# Patient Record
Sex: Male | Born: 1955
Health system: Southern US, Community
[De-identification: ages and names within clinical notes are randomized; demographics above are authoritative.]

## PROBLEM LIST (undated history)

## (undated) DIAGNOSIS — C8190 Hodgkin lymphoma, unspecified, unspecified site: Secondary | ICD-10-CM

## (undated) DIAGNOSIS — K219 Gastro-esophageal reflux disease without esophagitis: Secondary | ICD-10-CM

## (undated) DIAGNOSIS — J449 Chronic obstructive pulmonary disease, unspecified: Secondary | ICD-10-CM

## (undated) DIAGNOSIS — M4802 Spinal stenosis, cervical region: Secondary | ICD-10-CM

## (undated) DIAGNOSIS — F32A Depression, unspecified: Secondary | ICD-10-CM

## (undated) DIAGNOSIS — M51369 Other intervertebral disc degeneration, lumbar region without mention of lumbar back pain or lower extremity pain: Secondary | ICD-10-CM

## (undated) DIAGNOSIS — J869 Pyothorax without fistula: Secondary | ICD-10-CM

## (undated) DIAGNOSIS — H8109 Meniere's disease, unspecified ear: Secondary | ICD-10-CM

## (undated) DIAGNOSIS — M549 Dorsalgia, unspecified: Secondary | ICD-10-CM

## (undated) DIAGNOSIS — I2510 Atherosclerotic heart disease of native coronary artery without angina pectoris: Secondary | ICD-10-CM

## (undated) DIAGNOSIS — J189 Pneumonia, unspecified organism: Secondary | ICD-10-CM

## (undated) DIAGNOSIS — M1990 Unspecified osteoarthritis, unspecified site: Secondary | ICD-10-CM

## (undated) DIAGNOSIS — H9191 Unspecified hearing loss, right ear: Secondary | ICD-10-CM

## (undated) DIAGNOSIS — R0600 Dyspnea, unspecified: Secondary | ICD-10-CM

## (undated) DIAGNOSIS — I739 Peripheral vascular disease, unspecified: Secondary | ICD-10-CM

## (undated) DIAGNOSIS — J439 Emphysema, unspecified: Secondary | ICD-10-CM

## (undated) DIAGNOSIS — I959 Hypotension, unspecified: Secondary | ICD-10-CM

## (undated) DIAGNOSIS — U071 COVID-19: Secondary | ICD-10-CM

## (undated) DIAGNOSIS — F329 Major depressive disorder, single episode, unspecified: Secondary | ICD-10-CM

## (undated) DIAGNOSIS — M5136 Other intervertebral disc degeneration, lumbar region: Secondary | ICD-10-CM

## (undated) DIAGNOSIS — H9319 Tinnitus, unspecified ear: Secondary | ICD-10-CM

## (undated) HISTORY — DX: Meniere's disease, unspecified ear: H81.09

## (undated) HISTORY — DX: Unspecified osteoarthritis, unspecified site: M19.90

## (undated) HISTORY — DX: Pneumonia, unspecified organism: J18.9

## (undated) HISTORY — DX: Major depressive disorder, single episode, unspecified: F32.9

## (undated) HISTORY — PX: ELBOW SURGERY: SHX618

## (undated) HISTORY — DX: Tinnitus, unspecified ear: H93.19

## (undated) HISTORY — PX: EYE SURGERY: SHX253

## (undated) HISTORY — DX: Depression, unspecified: F32.A

## (undated) HISTORY — DX: Emphysema, unspecified: J43.9

## (undated) HISTORY — DX: Other intervertebral disc degeneration, lumbar region: M51.36

## (undated) HISTORY — DX: Gastro-esophageal reflux disease without esophagitis: K21.9

## (undated) HISTORY — PX: TONSILLECTOMY: SUR1361

## (undated) HISTORY — PX: OTHER SURGICAL HISTORY: SHX169

## (undated) HISTORY — DX: Other intervertebral disc degeneration, lumbar region without mention of lumbar back pain or lower extremity pain: M51.369

## (undated) HISTORY — DX: Pyothorax without fistula: J86.9

## (undated) HISTORY — DX: Chronic obstructive pulmonary disease, unspecified: J44.9

## (undated) HISTORY — PX: BACK SURGERY: SHX140

## (undated) HISTORY — DX: Hypotension, unspecified: I95.9

## (undated) HISTORY — DX: Hodgkin lymphoma, unspecified, unspecified site: C81.90

---

## 1997-11-03 DIAGNOSIS — C8190 Hodgkin lymphoma, unspecified, unspecified site: Secondary | ICD-10-CM

## 1997-11-03 HISTORY — DX: Hodgkin lymphoma, unspecified, unspecified site: C81.90

## 1997-11-03 HISTORY — PX: LAMINECTOMY: SHX219

## 2014-11-03 HISTORY — PX: CERVICAL FUSION: SHX112

## 2014-12-05 ENCOUNTER — Ambulatory Visit
Admit: 2014-12-05 | Discharge: 2014-12-05 | Disposition: A | Discharge status: Home / Self Care | Payer: Self-pay | Source: Ambulatory Visit | Attending: Neurosurgery | Admitting: Neurosurgery

## 2014-12-26 ENCOUNTER — Encounter: Payer: Self-pay | Admitting: Neurosurgery

## 2014-12-26 ENCOUNTER — Ambulatory Visit
Admit: 2014-12-26 | Discharge: 2014-12-26 | Disposition: A | Discharge status: Home / Self Care | Payer: Self-pay | Source: Ambulatory Visit | Attending: Neurosurgery | Admitting: Neurosurgery

## 2014-12-26 HISTORY — DX: Hodgkin lymphoma, unspecified, unspecified site: C81.90

## 2014-12-26 HISTORY — DX: Gastro-esophageal reflux disease without esophagitis: K21.9

## 2014-12-26 HISTORY — DX: Spinal stenosis, cervical region: M48.02

## 2014-12-26 HISTORY — DX: Chronic obstructive pulmonary disease, unspecified: J44.9

## 2014-12-26 LAB — PROTIME-INR
INR: 0.9 — ABNORMAL LOW (ref 1.0–1.2)
Protime: 10.5 s (ref 9.2–12.3)

## 2014-12-26 LAB — CBC
Hematocrit: 41 % (ref 40–51)
Hemoglobin: 13.9 g/dL (ref 13.7–17.5)
MCH: 34 pg — ABNORMAL HIGH (ref 26–32)
MCHC: 34 g/dL (ref 32–37)
MCV: 101 fL — ABNORMAL HIGH (ref 79–92)
Platelets: 141 10*3/uL — ABNORMAL LOW (ref 150–330)
RBC: 4 MIL/uL — ABNORMAL LOW (ref 4.6–6.1)
RDW: 13.2 % (ref 11.6–14.4)
WBC: 4 10*3/uL — ABNORMAL LOW (ref 4.2–9.1)

## 2014-12-26 LAB — BASIC METABOLIC PANEL
Anion Gap: 12 (ref 7–16)
CO2: 28 mmol/L (ref 20–28)
Calcium: 9 mg/dL (ref 8.6–10.2)
Chloride: 96 mmol/L (ref 96–108)
Creatinine: 1.04 mg/dL (ref 0.67–1.17)
GFR,Black: 90 *
GFR,Caucasian: 77 *
Glucose: 80 mg/dL (ref 60–99)
Lab: 10 mg/dL (ref 6–20)
Potassium: 4.4 mmol/L (ref 3.3–5.1)
Sodium: 136 mmol/L (ref 133–145)

## 2014-12-26 LAB — TYPE AND SCREEN
ABO RH Blood Type: O POS
Antibody Screen: NEGATIVE

## 2014-12-26 LAB — APTT: aPTT: 31.4 s (ref 25.8–37.9)

## 2014-12-26 NOTE — Anesthesia Preprocedure Evaluation (Addendum)
Anesthesia Pre-operative History and Physical for Joel Bryan    ______________________________________________________________________________________    By Cristela Felt, NP at 3:05 PM on 12/26/2014    <[email or web address]>  Anesthesia Evaluation Information Source: patient, records, family     ANESTHESIA     Denies anesthesia history  Pertinent(-):  history of anesthetic complications, Family Hx of Anesthetic Complications    GENERAL  Pertinent (-):  obesity, communication issues, substance abuse, history of anesthetic complications, Family Hx of Anesthetic Complications    HEENT    + Corrective Eyewear            glasses    + Neck Pain  Pertinent (-):   glaucoma PULMONARY    + Smoker            former, quit recently    + COPD (Daily spiriva. Rare need for rescue inhaler)            mild    + Cough/Congestion (chronic cough)  Pertinent(-): asthma, shortness of breath, recent URI, sleep apnea    CARDIOVASCULAR  Good(4+METs) Exercise Tolerance  Pertinent(-):  hypertension, past MI, CAD, anticoagulants, dysrhythmias, cardiac device, CHF, DVT    GI/HEPATIC/RENAL  Last PO Intake: >8hr before procedure    + GERD            well controlled    + Alcohol use            >2 drinks/day  Pertinent(-):  hiatal hernia, nausea, vomiting, renal issues, urinary issues NEURO/PSYCH  Pertinent(-):  syncope, seizures, cerebrovascular event    ENDO/OTHER  Pertinent(-):  diabetes mellitus, thyroid disease    HEMALOGIC  Pertinent(-):  bruises/bleeds easily, coagulopathy, arthritis       Physical Exam    Airway            Mouth opening: normal            Mallampati: III            TM distance (fb): >3 FB            Neck ROM: limited            Facial hair: beard, mustache  Dental    Upper: dentures Lower: dentures   Cardiovascular           Rhythm: regular           Rate: normal    Neurologic    Normal Exam    General Survey    Normal Exam   Pulmonary     + Wheezes  Inspiratory    Mental Status   Normal Exam        ________________________________________________________________________  Plan  ASA Score  2  Anesthetic Plan general    Induction (RSI); General Anesthesia/Sedation Maintenance Plan (inhaled agents);  Airway Manipulation (direct laryngoscopy); Airway (cuffed ETT); Line ( use current access); Monitoring (standard ASA); Positioning (supine); PONV Plan (dexamethasone, promethazine and ondansetron); Pain (per surgical team and intraop local); PostOp (PACU)    Informed Consent     Risks:          Risks discussed were commensurate with the plan listed above with the following specific points: N/V and sore throat , damage to:(eyes, teeth), allergic Rx, unexpected serious injury, death    Anesthetic Consent:      Anesthetic plan (and risks as noted above) were discussed with patient and spouse    Attending Attestation:  As the primary attending anesthesiologist, I attest that the patient or proxy understands  and accepts the risks and benefits of the anesthesia plan. I also attest that I have personally performed a pre-anesthetic examination and evaluation, and prescribed the anesthetic plan for this particular location within 48 hours prior to the anesthetic as documented.

## 2014-12-26 NOTE — Discharge Instructions (Addendum)
Pre-Operative Instructions Record                    HH [id number] MR    PRIOR TO SURGERY    Five days before surgery, you must STOP all aspirin, ibuprofen (Advil, Naproxen, Aleve, Motrin, etc.) and all vitamins and herbal supplements.     YOU MAY TAKE ACETAMINOPHEN (TYLENOL).    FOLLOW YOUR SURGEON'S INSTRUCTIONS IF DIFFERENT THAN ABOVE.      DAY BEFORE SURGERY   2/26    Call [phone] between 1PM and 4PM and select option #1 to receive your arrival/surgery time.        Do not eat anything (including candy or gum) after midnight the night before your surgery.  ____________________________________________________________________________    DAY OF SURGERY    2/29    Up to 4 hours before your surgery time, clear liquids are allowed (unless your doctor tells you differently).  Examples: black coffee or tea (no dairy or non-dairy creamer), soda, water, clear apple or cranberry juice.  No orange or tomato juice.     MEDICATIONS   PLEASE REFER TO THE MEDICATION LIST ON THE ATTACHED PAGE AND ONLY TAKE THE MEDICATIONS MARKED ON THAT LIST.   DO NOT TAKE ANY MEDICATIONS THAT WERE ALREADY STOPPED (ABOVE).   Bring and use inhalers as needed.   Pain and anxiety medications may be taken with a sip of water at any time.      DO NOT WEAR ANY RINGS, JEWELRY,  BODY LOTION OR SCENTS.      You may brush your teeth and use deodorant.  If wearing eyeglasses, please bring a case.  DO NOT WEAR CONTACT LENSES.     __ BRING CPAP IF USING           _X_ Skin prep and instructions given         PLEASE SHOWER THE NIGHT BEFORE SURGERY AND THE MORNING OF SURGERY         _X_ Bring brace into the hospital on the day of surgery  ________________________________________________________________________________    AT [place] in the [place] garage.  Report to [facility] on [address].  Leave your belongings in the car and your  visitors can bring them to your room after surgery.    Any questions? Call [phone], select option 1, and ask to speak with a nurse or call your surgeon.    The PATIENT AND FAMILY has participated in the development of this discharge plan and the above material has been reviewed.  Questions have been answered and he/she understands the contents of this plan and has received a copy of it.  Shari Heritage, RN 12/26/2014 2:47 PM        ON THE DAY OF YOUR SURGERY, FROM THE LIST OF MEDICATIONS BELOW, TAKE ONLY THOSE MEDICATIONS CHECKED.

## 2014-12-27 NOTE — Progress Notes (Signed)
Chart review completed due to upcoming surgery. No Sw needs identified at this time.    Helene Shoe, LMSW  12/27/2014  9:22 AM  #socialwork

## 2015-01-01 ENCOUNTER — Encounter
Disposition: A | Discharge status: Home / Self Care | Payer: Self-pay | Source: Ambulatory Visit | Attending: Neurosurgery

## 2015-01-01 SURGERY — DISCECTOMY, SPINE, CERVICAL, ANTERIOR APPROACH, WITH FUSION
Anesthesia: General | Site: Neck

## 2015-01-09 NOTE — Plan of Care (Signed)
Problem: Knowledge deficit related to pre or post-op regimens  Goal: Patient verbalizes understanding of PACU teaching  Outcome: Completed or Resolved Date Met:  01/09/15

## 2015-01-09 NOTE — Telephone (Signed)
Patient surgery moved from original date. Reinforced instructions given at pretesting appointment. Patient verbalized understanding of instructions.

## 2015-01-11 NOTE — Progress Notes (Signed)
Chart review completed due to upcoming surgery. No SW needs identified at this time.      Helene Shoe, LMSW  01/11/2015  11:09 AM  #socialwork

## 2015-01-16 ENCOUNTER — Inpatient Hospital Stay
Admit: 2015-01-16 | Disposition: A | Discharge status: Home / Self Care | Payer: Self-pay | Source: Ambulatory Visit | Attending: Neurosurgery | Admitting: Neurosurgery

## 2015-01-16 ENCOUNTER — Ambulatory Visit: Payer: Self-pay

## 2015-01-16 ENCOUNTER — Encounter: Payer: Self-pay | Admitting: Family Medicine

## 2015-01-16 ENCOUNTER — Encounter
Disposition: A | Discharge status: Home / Self Care | Payer: Self-pay | Source: Ambulatory Visit | Attending: Neurosurgery

## 2015-01-16 LAB — POCT GLUCOSE
Glucose POCT: 90 mg/dL (ref 60–99)
Glucose POCT: 92 mg/dL (ref 60–99)

## 2015-01-16 LAB — TYPE AND SCREEN
ABO RH Blood Type: O POS
Antibody Screen: NEGATIVE

## 2015-01-16 SURGERY — DISCECTOMY, SPINE, CERVICAL, ANTERIOR APPROACH, WITH FUSION
Anesthesia: General | Site: Neck | Wound class: Clean

## 2015-01-16 MED ORDER — DEXAMETHASONE SODIUM PHOSPHATE 4 MG/ML INJ SOLN *WRAPPED*
INTRAMUSCULAR | Status: DC | PRN
Start: 2015-01-16 — End: 2015-01-16
  Administered 2015-01-16: 4 mg via INTRAVENOUS

## 2015-01-16 MED ORDER — DIAZEPAM 5 MG PO TABS *I*
5.0000 mg | ORAL_TABLET | Freq: Once | ORAL | Status: AC | PRN
Start: 2015-01-16 — End: 2015-01-16

## 2015-01-16 MED ORDER — FENTANYL CITRATE 50 MCG/ML IJ SOLN *WRAPPED*
INTRAMUSCULAR | Status: AC
Start: 2015-01-16 — End: 2015-01-16
  Filled 2015-01-16: qty 5

## 2015-01-16 MED ORDER — HYDROMORPHONE HCL PF 1 MG/ML IJ SOLN *WRAPPED*
INTRAMUSCULAR | Status: AC
Start: 2015-01-16 — End: 2015-01-16
  Filled 2015-01-16: qty 1

## 2015-01-16 MED ORDER — HYDROMORPHONE HCL PF 1 MG/ML IJ SOLN *WRAPPED*
0.4000 mg | INTRAMUSCULAR | Status: DC | PRN
Start: 2015-01-16 — End: 2015-01-16
  Administered 2015-01-16 (×4): 0.4 mg via INTRAVENOUS
  Filled 2015-01-16 (×2): qty 1

## 2015-01-16 MED ORDER — LIDOCAINE HCL 2 % (PF) IJ SOLN *I*
INTRAMUSCULAR | Status: AC
Start: 2015-01-16 — End: 2015-01-16
  Filled 2015-01-16: qty 5

## 2015-01-16 MED ORDER — SUCCINYLCHOLINE CHLORIDE 20 MG/ML IV/IJ SOLN *WRAPPED*
Status: AC
Start: 2015-01-16 — End: 2015-01-16
  Filled 2015-01-16: qty 10

## 2015-01-16 MED ORDER — LACTATED RINGERS IV SOLN *I*
20.0000 mL/h | INTRAVENOUS | Status: DC
Start: 2015-01-16 — End: 2015-01-16
  Administered 2015-01-16: 20 mL/h via INTRAVENOUS

## 2015-01-16 MED ORDER — PROPOFOL 10 MG/ML IV EMUL (INTERMITTENT DOSING) WRAPPED *I*
INTRAVENOUS | Status: DC | PRN
Start: 2015-01-16 — End: 2015-01-16
  Administered 2015-01-16: 160 mg via INTRAVENOUS
  Administered 2015-01-16: 40 mg via INTRAVENOUS

## 2015-01-16 MED ORDER — LACTATED RINGERS IV SOLN *I*
75.0000 mL/h | INTRAVENOUS | Status: DC
Start: 2015-01-16 — End: 2015-01-17
  Administered 2015-01-16: 75 mL/h via INTRAVENOUS

## 2015-01-16 MED ORDER — IPRATROPIUM-ALBUTEROL 0.5-2.5 MG/3ML IN SOLN *I*
3.0000 mL | Freq: Once | RESPIRATORY_TRACT | Status: DC
Start: 2015-01-16 — End: 2015-01-16
  Filled 2015-01-16: qty 3

## 2015-01-16 MED ORDER — PROMETHAZINE HCL 25 MG/ML IJ SOLN *I*
INTRAMUSCULAR | Status: AC
Start: 2015-01-16 — End: 2015-01-16
  Filled 2015-01-16: qty 1

## 2015-01-16 MED ORDER — PROMETHAZINE HCL 25 MG/ML IJ SOLN *I*
INTRAMUSCULAR | Status: DC | PRN
Start: 2015-01-16 — End: 2015-01-16
  Administered 2015-01-16: 12.5 mg via INTRAVENOUS

## 2015-01-16 MED ORDER — BUDESONIDE-FORMOTEROL FUMARATE 160-4.5 MCG/ACT IN AERO *I*
2.0000 | INHALATION_SPRAY | Freq: Two times a day (BID) | RESPIRATORY_TRACT | Status: DC
Start: 2015-01-16 — End: 2015-01-17
  Administered 2015-01-16: 2 via RESPIRATORY_TRACT
  Filled 2015-01-16: qty 6

## 2015-01-16 MED ORDER — OXYCODONE HCL ER 20 MG PO T12A *I*
30.0000 mg | EXTENDED_RELEASE_TABLET | Freq: Two times a day (BID) | ORAL | Status: DC
Start: 2015-01-16 — End: 2015-01-17
  Administered 2015-01-16: 30 mg via ORAL
  Filled 2015-01-16 (×2): qty 1

## 2015-01-16 MED ORDER — LIDOCAINE HCL 2 % IJ SOLN *I*
INTRAMUSCULAR | Status: DC | PRN
Start: 2015-01-16 — End: 2015-01-16
  Administered 2015-01-16: 80 mg via INTRAVENOUS

## 2015-01-16 MED ORDER — SENNOSIDES 8.6 MG PO TABS *I*
2.0000 | ORAL_TABLET | Freq: Every evening | ORAL | Status: DC
Start: 2015-01-16 — End: 2015-01-17
  Administered 2015-01-16: 2 via ORAL
  Filled 2015-01-16: qty 2

## 2015-01-16 MED ORDER — LACTATED RINGERS IV SOLN *I*
125.0000 mL/h | INTRAVENOUS | Status: DC
Start: 2015-01-16 — End: 2015-01-16
  Administered 2015-01-16 (×2): 125 mL/h via INTRAVENOUS

## 2015-01-16 MED ORDER — ROCURONIUM BROMIDE 10 MG/ML IV SOLN *WRAPPED*
Status: AC
Start: 2015-01-16 — End: 2015-01-16
  Filled 2015-01-16: qty 5

## 2015-01-16 MED ORDER — DEXAMETHASONE SODIUM PHOSPHATE 4 MG/ML INJ SOLN *WRAPPED*
INTRAMUSCULAR | Status: AC
Start: 2015-01-16 — End: 2015-01-16
  Filled 2015-01-16: qty 2

## 2015-01-16 MED ORDER — LIDOCAINE HCL 1 % IJ SOLN *I*
0.1000 mL | INTRAMUSCULAR | Status: DC | PRN
Start: 2015-01-16 — End: 2015-01-16
  Administered 2015-01-16: 0.1 mL via SUBCUTANEOUS
  Filled 2015-01-16: qty 2

## 2015-01-16 MED ORDER — MIDAZOLAM HCL 5 MG/5ML IJ SOLN *I*
INTRAMUSCULAR | Status: AC
Start: 2015-01-16 — End: 2015-01-16
  Filled 2015-01-16: qty 5

## 2015-01-16 MED ORDER — NEOSTIGMINE METHYLSULFATE 10 MG/10ML IV SOLN *I*
INTRAVENOUS | Status: DC | PRN
Start: 2015-01-16 — End: 2015-01-16
  Administered 2015-01-16: 3 mg via INTRAVENOUS

## 2015-01-16 MED ORDER — THROMBIN (RECOMBINANT) 5000 UNIT EX SOLR *I* WRAPPED
CUTANEOUS | Status: DC | PRN
Start: 2015-01-16 — End: 2015-01-16
  Administered 2015-01-16: 10000 [IU] via TOPICAL

## 2015-01-16 MED ORDER — SODIUM CHLORIDE 0.9 % IV SOLN WRAPPED *I*
INTRAMUSCULAR | Status: DC | PRN
Start: 2015-01-16 — End: 2015-01-16
  Administered 2015-01-16: 500 mL

## 2015-01-16 MED ORDER — ALBUTEROL SULFATE (2.5 MG/3ML) 0.083% IN NEBU *I*
2.5000 mg | INHALATION_SOLUTION | Freq: Once | RESPIRATORY_TRACT | Status: DC | PRN
Start: 2015-01-16 — End: 2015-01-16
  Filled 2015-01-16: qty 3

## 2015-01-16 MED ORDER — CEFAZOLIN 2000 MG IN 100 ML D5W *I*
2000.0000 mg | INTRAVENOUS | Status: AC
Start: 2015-01-16 — End: 2015-01-16
  Administered 2015-01-16: 2 g via INTRAVENOUS
  Filled 2015-01-16: qty 1

## 2015-01-16 MED ORDER — MIDAZOLAM HCL 1 MG/ML IJ SOLN *I* WRAPPED
INTRAMUSCULAR | Status: DC | PRN
Start: 2015-01-16 — End: 2015-01-16
  Administered 2015-01-16: 3 mg via INTRAVENOUS
  Administered 2015-01-16: 2 mg via INTRAVENOUS

## 2015-01-16 MED ORDER — POVIDONE-IODINE 5 % EX SOLN *I*
Freq: Once | CUTANEOUS | Status: AC
Start: 2015-01-16 — End: 2015-01-16

## 2015-01-16 MED ORDER — PHENYLEPHRINE 100 MCG/ML IN NS 10 ML *WRAPPED*
INTRAMUSCULAR | Status: DC | PRN
Start: 2015-01-16 — End: 2015-01-16
  Administered 2015-01-16 (×2): 200 ug via INTRAVENOUS
  Administered 2015-01-16: 100 ug via INTRAVENOUS

## 2015-01-16 MED ORDER — SUCCINYLCHOLINE CHLORIDE 20 MG/ML IV/IJ SOLN *WRAPPED*
Status: DC | PRN
Start: 2015-01-16 — End: 2015-01-16
  Administered 2015-01-16: 80 mg via INTRAVENOUS

## 2015-01-16 MED ORDER — FENTANYL CITRATE 50 MCG/ML IJ SOLN *WRAPPED*
INTRAMUSCULAR | Status: DC | PRN
Start: 2015-01-16 — End: 2015-01-16
  Administered 2015-01-16: 200 ug via INTRAVENOUS
  Administered 2015-01-16: 50 ug via INTRAVENOUS

## 2015-01-16 MED ORDER — PHENYLEPHRINE 100 MCG/ML IN NS 10 ML *WRAPPED*
INTRAMUSCULAR | Status: AC
Start: 2015-01-16 — End: 2015-01-16
  Filled 2015-01-16: qty 10

## 2015-01-16 MED ORDER — SODIUM CHLORIDE 0.9 % IV SOLN WRAPPED *I*
20.0000 mL/h | Status: DC
Start: 2015-01-16 — End: 2015-01-16

## 2015-01-16 MED ORDER — GLYCOPYRROLATE 0.2 MG/ML IJ SOLN *I*
INTRAMUSCULAR | Status: DC | PRN
Start: 2015-01-16 — End: 2015-01-16
  Administered 2015-01-16: 0.4 mg via INTRAVENOUS

## 2015-01-16 MED ORDER — ONDANSETRON HCL 2 MG/ML IV SOLN *I*
4.0000 mg | Freq: Four times a day (QID) | INTRAMUSCULAR | Status: DC | PRN
Start: 2015-01-16 — End: 2015-01-17

## 2015-01-16 MED ORDER — ONDANSETRON HCL 2 MG/ML IV SOLN *I*
INTRAMUSCULAR | Status: DC | PRN
Start: 2015-01-16 — End: 2015-01-16
  Administered 2015-01-16: 4 mg via INTRAMUSCULAR

## 2015-01-16 MED ORDER — OXYCODONE HCL 5 MG PO TABS *I*
5.0000 mg | ORAL_TABLET | ORAL | Status: DC | PRN
Start: 2015-01-16 — End: 2015-01-17
  Administered 2015-01-17 (×2): 5 mg via ORAL
  Filled 2015-01-16 (×2): qty 1

## 2015-01-16 MED ORDER — CEFAZOLIN IN D5W 1 GM/50ML IV SOLN *I*
1000.0000 mg | Freq: Three times a day (TID) | INTRAVENOUS | Status: AC
Start: 2015-01-16 — End: 2015-01-17
  Administered 2015-01-16 – 2015-01-17 (×2): 1000 mg via INTRAVENOUS
  Filled 2015-01-16 (×2): qty 1

## 2015-01-16 MED ORDER — HYDROMORPHONE HCL PF 1 MG/ML IJ SOLN *WRAPPED*
INTRAMUSCULAR | Status: DC | PRN
Start: 2015-01-16 — End: 2015-01-16
  Administered 2015-01-16 (×3): 0.5 mg via INTRAVENOUS

## 2015-01-16 MED ORDER — LACTATED RINGERS IV BOLUS *I*
1000.0000 mL | Freq: Once | INTRAVENOUS | Status: AC
Start: 2015-01-17 — End: 2015-01-17
  Administered 2015-01-17: 1000 mL via INTRAVENOUS

## 2015-01-16 MED ORDER — PROMETHAZINE HCL 25 MG/ML IJ SOLN *I*
6.2500 mg | Freq: Once | INTRAMUSCULAR | Status: AC | PRN
Start: 2015-01-16 — End: 2015-01-16
  Administered 2015-01-16: 6.25 mg via INTRAVENOUS
  Filled 2015-01-16 (×2): qty 1

## 2015-01-16 MED ORDER — ONDANSETRON HCL 2 MG/ML IV SOLN *I*
4.0000 mg | Freq: Once | INTRAMUSCULAR | Status: DC | PRN
Start: 2015-01-16 — End: 2015-01-16
  Filled 2015-01-16: qty 2

## 2015-01-16 MED ORDER — ONDANSETRON HCL 2 MG/ML IV SOLN *I*
INTRAMUSCULAR | Status: AC
Start: 2015-01-16 — End: 2015-01-16
  Filled 2015-01-16: qty 2

## 2015-01-16 MED ORDER — ROCURONIUM BROMIDE 10 MG/ML IV SOLN *WRAPPED*
Status: DC | PRN
Start: 2015-01-16 — End: 2015-01-16
  Administered 2015-01-16: 10 mg via INTRAVENOUS
  Administered 2015-01-16: 20 mg via INTRAVENOUS
  Administered 2015-01-16: 30 mg via INTRAVENOUS

## 2015-01-16 MED ORDER — PROPOFOL 10 MG/ML IV EMUL (INTERMITTENT DOSING) WRAPPED *I*
INTRAVENOUS | Status: AC
Start: 2015-01-16 — End: 2015-01-16
  Filled 2015-01-16: qty 20

## 2015-01-16 MED ORDER — DOCUSATE SODIUM 100 MG PO CAPS *I*
100.0000 mg | ORAL_CAPSULE | Freq: Two times a day (BID) | ORAL | Status: DC
Start: 2015-01-16 — End: 2015-01-17
  Administered 2015-01-16: 100 mg via ORAL
  Filled 2015-01-16: qty 1

## 2015-01-16 MED ORDER — MORPHINE SULFATE 2 MG/ML IV SOLN *WRAPPED*
2.0000 mg | Status: AC | PRN
Start: 2015-01-16 — End: 2015-01-17
  Administered 2015-01-17: 2 mg via INTRAVENOUS
  Filled 2015-01-16: qty 1

## 2015-01-16 SURGICAL SUPPLY — 38 items
4.0mm variable angle (green) self drilling screws ×16 IMPLANT
APPLICATOR DURAPREP 6ML (Solution) ×2 IMPLANT
BIT DRILL ADJ CIRCULAR 13MM (Supply) ×2 IMPLANT
BIT DRILL STERILE 13MM (Implant) ×1 IMPLANT
BLANKET LOWER BODY TEMP THERAPY (Drape) IMPLANT
BUR LEGEND DISS CYLINDR 26CM X 7.5MM (Other) ×2 IMPLANT
BUR LEGEND FLUTED BALL 26CM X 6MM (Other) ×2 IMPLANT
CORD BIPOLAR 12 FT (Supply) ×1
CORD ELECTROSURGICAL L12FT BIPOLAR DISPOSABLE FOR FOOT SWITCHING FORCEPS (Supply) ×1 IMPLANT
DRAPE C ARM 42 X 64 (Drape) ×2
DRAPE C ARM W42XL64IN W/ POLY STRP FOR MOB XR (Drape) ×2 IMPLANT
DRESSING TEGADERM W/LABEL 4 X 4 3/4IN (Dressing) ×4 IMPLANT
ELECTRODE BLADE MODIFIED 2.5IN (Supply) ×2 IMPLANT
GLOVE SURG PROTEXIS PI 8.5 PF SYN (Glove) ×20 IMPLANT
GLOVE SURG PROTEXIS SZ 8.0 PF LTX (Glove) ×4 IMPLANT
GOWN SIRUS POLY REINFORCED X-LONG 3X (Gown) ×2 IMPLANT
GRAFT BNE SUB SM CANC MORSELIZED W/ VIABLE CELL FRZN TRINITY ELITE (Tissue) ×4 IMPLANT
HEMOSTAT FIBRILLAR ABSORB 2X4 (Supply) IMPLANT
PACK CUSTOM LUMBAR LAMINECTOMY PACK (Pack) ×2 IMPLANT
PACK MINOR LINEN (Other) ×1
PACK MR7 DIFFUSER LUBRICANT DISP (Other) ×2 IMPLANT
PACK SURGICAL PROCEDURE LINEN MINOR (Other) ×1 IMPLANT
PIN DISTRACTION 12MM (Pin) IMPLANT
PIN DISTRACTION 14MM (Pin) IMPLANT
PIN TEMPORARY FIXATION ATLANTIS STAND SS (Pin) ×2 IMPLANT
PLATE ATL VISION ELITE 55MM (Implant) ×2 IMPLANT
PLATE SPNL W55MM 2 LEVEL FOR ANT CERV PLATE SYSTEM ATLANTIS VISION ELITE (Implant) ×1 IMPLANT
PROTECTOR ULNA NERVE ~~LOC~~ (Supply) ×2 IMPLANT
SOL SODIUM CHLORIDE IRRIG 1000ML BTL (Solution) ×2 IMPLANT
SOL WATER IRRIG STERILE 1000ML BTL (Solution) ×2 IMPLANT
SPACER SPNL VALEO 0 DEG 17 X 14 X 7 MM CERAMIC (Implant) ×4 IMPLANT
SPACER VALEO SYSTEM 8MM 17X14 0 (Implant) ×2 IMPLANT
SPONGE HEMOSTATIC GELATIN PWD SURGIFOAM 1GM (Supply) ×2 IMPLANT
SPONGE K DISSECTOR (Sponge) ×1
SPONGE SUR W0.25XL9/16IN WHT COT RND KTNR DISECT RADPQ DISP (Sponge) ×1 IMPLANT
SPONGE X-RAY 4 X 4IN LF STER (Sponge) ×2 IMPLANT
SUTR VICRYL ANTIB BRD 2-0 CP-2 18 UNDYED (Suture) ×2 IMPLANT
SUTR VICRYL CTD3-0 PS-2 UNDY (Suture) ×2 IMPLANT

## 2015-01-16 NOTE — Progress Notes (Signed)
Surgery Post-Op Note    Subjective:  Resting comfortably in bed with wife at bedside. Pain controlled. Denies new weakness or numbness.    Objective:    Vital signs:  Filed Vitals:    01/16/15 2215   BP: 124/60   Pulse:    Temp:    Resp: 16   Height:    Weight:        Physical Exam:    Gen: A&O, NAD  Neck: dressing c/d/i  CV: RRR  Resp: unlabored  Abd: soft  Ext: warm, well-perfused; no cyanosis; strength and sensation equal bilaterally      Assessment/Plan:    59 y.o. male POD #0 status post anterior cervical discectomy with fusion c4-7. Doing well on POC.      Levonne Lapping, MD     01/16/2015     11:12 PM

## 2015-01-16 NOTE — INTERIM OP NOTE (Signed)
Interim Op Note (Surgical Log ID: [id number])       Date of Surgery: 01/16/2015       Surgeons: Surgeon(s) and Role:     Carola Rhine, MD - Primary       Pre-op Diagnosis: Pre-Op Diagnosis Codes:     * Cervical spondylosis [M47.812]       Post-op Diagnosis: Post-Op Diagnosis Codes:     * Cervical spondylosis [M47.812]       Procedure(s) Performed: ACDF C4-7       Additional CPT Codes:        Anesthesia Type: General        Fluid Totals: I/O this shift:  03/15 1500 - 03/15 2259  In: 2700 (39.7 mL/kg) [I.V.:2700]  Out: 250 (3.7 mL/kg) [Urine:200; Blood:50]  Net: 2450  Weight: 68 kg        Estimated Blood Loss: Blood Loss: 0 mL       Specimens to Pathology:    ID Type Source Tests Collected by Time Destination   A : cervical spine bone and tissue TISSUE Neck SURGICAL PATHOLOGY Netty Starring, RN 01/16/2015 1730           Temporary Implants:        Packing:                 Patient Condition: good       Findings (Including unexpected complications):    * Cervical spondylosis [F81.829]  SURGEON:  Carola Rhine, MD   ASSISTANT: Benay Spice, RPA-C   SURGERY DATE:  01/16/2015    PREOPERATIVE DIAGNOSIS: M47.812 Cervical Spondylosis without Myelopathy   POSTOPERATIVE DIAGNOSIS: M47.812  Cervical Spondylosis without Myelopathy     OPERATIVE PROCEDURE: ANTERIOR CERVICAL DISCECTOMIES AND FUSION      1. Anterior cervical discectomy C4-5, C5-6, C6-7  2. Arthrodesis C4-5, C5-6, C6-7  3. Segmental Spinal Instrumentation C4-5, C5-6, C6-7  4. Machined Biomechanical Bioceramic Spacers  5. Intraoperative Neuromonitoring       ANESTHESIA: General endotracheal anesthesia  INDICATION FOR OPERATION / RISKS / ALTERNATIVES:  Failed Non-Operative Care. Patient reports progressively worsening neck and left sided arm pain.  Risks discussed included but were not limited to the risks associated with any spinal surgery including: bleeding, infection, discomfort, stroke, paralysis, death, CSF leak, dural tear, blood clot, blood vessel injury,  hardware failure,  hoarseness and trouble swallowing, failure of fusion and nerve injury. Alternatives include no surgery and posterior cervical decompression.  The patient understands the risks and benefits and wishes to proceed with anterior surgical decompression and fusion.    DESCRIPTION OF PROCEDURE:  The patient was brought to the operating room and was intubated in the supine position.  Head held neutral on the [place] head holder.  Prepped and draped in the usual sterile fashion.  All of the major pathology was noted to be present at C4-5, C5-6 and C6-7.  These were the levels that were addressed surgically.  A transverse incision was made over the C4-5 disc space on the RIGHT hand side, carried this down to the level of the platysma that was divided sharply.  Carried down to the level of the prevertebral fascia that was divided.  The spinal marking needle was placed in the C4-5 level and confirmed radiographically.  Following this self-retaining retractors were placed underneath the longus colli.      1. Anterior cervical discectomy C4-5, C5-6, C6-7  The C4-5, C5-6, C6-7 disc spaces were excised in its entirety along with the cartilaginous end  plates. These were removed down to the level of the posterior longitudinal ligament that was opened.  There were large bony osteophytes protruding back into the neural foramina at the C5-6 and C6-7 levels which were removed in their entirety along with large anterior bony osteophytes.  There was a large bony osteophyte protruding back into the neural foramina at C4-5 which was removed in its entirety along with large anterior bony osteophytes.  End plates were burred flat and parallel. Large anterior osteophyte and disc herniation at C5-6 and C6-7 on the Left  side.  2.  Arthrodesis C4-5, C5-6, C6-7  Arthrodesis was performed of the C4-5 level using a machined biomechanical Bioceramic spacer. This was tamped into position and confirmed radiographically. Arthrodesis  was performed of the C5-6 level using a machined biomechanical Bioceramic spacer. This was tamped into position and confirmed radiographically. Arthrodesis was then performed of the C6-7 level, again using a machined biomechanical Bioceramic spacer. This was tamped into position and confirmed radiographically.    3.  Segmental spinal instrumentation C4-5, C5-6, C6-7  An Atlantis plate was placed with two screws in C4, C5, C6 and two screws in C7 positioned under radiographic guidance. The locking screws were then tightened, locking the plate into position.  4. Machined Biomechanical Bioceramic Spacer  A machined Bioceramic spacer was placed into the C4-5 level, a machined Bioceramic spacer was placed into the C5-6 level and a machined Bioceramic spacer was placed into the C6-7 level, these were tamped into position and provided structural arthrodesis. X-ray confirmed the position.    5.  Intraoperative Neuromonitoring    Leads were applied to the arms and legs and tested for accuracy.  Intraoperative Monitoring was performed with  intermittent  checking for neural structures at each stage of the procedure.  No nerves or nerve roots were identified to  be at risk within the operative field during the dissection, discectomy and  instrumentation phases of the procedure.   Motor Evoked and Somatosensory evoked potentials were assessed throughout the procedure and exhibited no changes.    The Platysma was closed with interrupted 2-0 Vicryl sutures, subcutaneous tissue with 3-0 Vicryl and Steri-Strips were applied to the epidermis.        Signed:  Carola Rhine, MD  on 01/16/2015 at 10:28 PM

## 2015-01-16 NOTE — Anesthesia Case Conclusion (Signed)
CASE CONCLUSION  Emergence  Actions:  Suctioned and extubated  Criteria Used for Airway Removal:  Adequate Tv & RR and acceptable O2 saturation  Assessment:  Routine  Transport  Directly to: PACU  Airway:  Facemask  Oxygen Delivery:  10 lpm  Position:  Recumbent  Patient Condition on Handoff  Level of Consciousness:  Deeply sedated/GA  Patient Condition:  Stable  Handoff Report to:  RN

## 2015-01-16 NOTE — INTERIM OP NOTE (Signed)
Interim Op Note (Surgical Log ID: [id number])       Date of Surgery: 01/16/2015       Surgeons: Surgeon(s) and Role:     Carola Rhine, MD - Primary       Pre-op Diagnosis: Pre-Op Diagnosis Codes:     * Cervical spondylosis [M47.812]       Post-op Diagnosis: Post-Op Diagnosis Codes:     * Cervical spondylosis [M47.812]       Procedure(s) Performed: ACDF C4-7        Additional CPT Codes:        Anesthesia Type: General        Fluid Totals: I/O this shift:  03/15 1500 - 03/15 2259  In: 2500 (36.7 mL/kg) [I.V.:2500]  Out: 50 (0.7 mL/kg) [Blood:50]  Net: 2450  Weight: 68 kg        Estimated Blood Loss: Blood Loss: 50 mL       Specimens to Pathology:    ID Type Source Tests Collected by Time Destination   A : cervical spine bone and tissue TISSUE Neck SURGICAL PATHOLOGY Netty Starring, RN 01/16/2015 1730           Temporary Implants:        Packing:                 Patient Condition: good       Findings (Including unexpected complications): none     Signed:  Benay Spice, PA  on 01/16/2015 at 6:01 PM

## 2015-01-16 NOTE — Anesthesia Procedure Notes (Signed)
---------------------------------------------------------------------------------------------------------------------------------------    AIRWAY   GENERAL INFORMATION AND STAFF    Patient location during procedure: OR       Date of Procedure: 01/16/2015 3:38 PM  CONDITION PRIOR TO MANIPULATION     Current Airway/Neck Condition:  Normal        For more airway physical exam details, see Anesthesia PreOp Evaluation  AIRWAY METHOD     Patient Position:  Sniffing    Preoxygenated: yes      Induction: IV  Mask Difficulty Assessment:  0 - not attempted      Technique Used for Successful ETT Placement:  Direct laryngoscopy    Devices/Methods Used in Placement:  Intubating stylet    Blade Type:  Miller    Laryngoscope Blade/Video laryngoscope Blade Size:  2    Cormack-Lehane Classification:  Grade I - full view of glottis    Placement Verified by: capnometry, auscultation and equal breath sounds      Number of Attempts at Approach:  1    Number of Other Approaches Attempted:  0  FINAL AIRWAY DETAILS    Final Airway Type:  Endotracheal airway    Final Endotracheal Airway:  ETT    Insertion Site:  Oral    ETT Size (mm):  7.5    Distance inserted from Gums (cm):  22  ----------------------------------------------------------------------------------------------------------------------------------------

## 2015-01-16 NOTE — Progress Notes (Signed)
UPDATES TO PATIENT'S CONDITION on the DAY OF SURGERY/PROCEDURE    I. Updates to Patient's Condition (to be completed by a provider privileged to complete a H&P, following reassessment of the patient by the provider):    Day of Surgery/Procedure Update:  History  History reviewed and no change    Physical  Physical exam updated and no change              II. Procedure Readiness   I have reviewed the patient's H&P and updated condition. By completing and signing this form, I attest that this patient is ready for surgery/procedure.      III. Attestation   I have reviewed the updated information regarding the patient's condition and it is appropriate to proceed with the planned surgery/procedure.    Carola Rhine, MD as of 2:17 PM 01/16/2015

## 2015-01-17 MED ORDER — OXYCODONE HCL ER 30 MG PO T12A *A*
30.0000 mg | EXTENDED_RELEASE_TABLET | Freq: Two times a day (BID) | ORAL | Status: AC
Start: 2015-01-17 — End: ?

## 2015-01-17 MED ORDER — DIAZEPAM 5 MG PO TABS *I*
5.0000 mg | ORAL_TABLET | Freq: Four times a day (QID) | ORAL | Status: AC | PRN
Start: 2015-01-17 — End: 2015-02-16

## 2015-01-17 MED ORDER — OXYCODONE HCL 5 MG PO TABS *I*
5.0000 mg | ORAL_TABLET | ORAL | Status: AC | PRN
Start: 2015-01-17 — End: ?

## 2015-01-17 NOTE — Discharge Instructions (Signed)
[facility]  Dr. Seth Zeidman  [phone]    Discharge Instructions-Cervical Fusion    Medication:You will be prescribed pain medication. This this on an as-needed basis. Prescriptions for pain medication will be provided for you for up to 8 weeks after surgery. During that time if you will need a refill, please call the office at least 4 days before you run out so it can be sent in the mail. We cannot phone in narcotic prescriptions. If you require additional medication after 8 weeks, this will need to be addressed with your primary care provider.    Do not use aspirin, NSAIDS (non-steroidal anti-inflammatory medications) such as Advil, Aleve, ibuprofen, Motrin, Celebrex or any other blood thinning medication without approval from our office within the first 6 weeks after surgery. These medications may be resumed 6 weeks after the date of your surgery.     Activity/Restrictions: You must wear your collar for a total of 4 weeks after surgery. You may remove collar for showering, when you are in bed, and for meals. Please call your brace representative for any problems with the fitting of the collar.     Right after surgery, the only exercise that is permitted is walking which you should try to do as much as possible. Lifting should not exceed 10 pounds. You are to avoid strenuous activity.    You may begin to drive 4 weeks after your surgery only if you are no longer taking narcotic pain medication.    Wound Care: You may shower when you get home, then remove dressing (if one is still in place) and leave incision open to air.  Keep the wound as dry as possible, covering it when you shower (we recommend covering your incision with Saran wrap and tape) for the first week then shower uncovered. Try to avoid the water hitting directly on the incision. You may leave steri-strips (small pieces of tape) over the wound. You should remove the steri-strips after 7-10 days if they have not already  fallen off on their own. Do not use creams or lotions on the incision. No tub baths, swimming or hot tubs until the incision is well sealed (approx. 8 weeks).    You may use a heating pad or any form of dry heat on your shoulders to relieve the discomfort of muscle spasms. Do not use any wet treatment on the incision for the first 14 days.    General: You should drink adequate fluids and use a stool softener and/or laxative or suppository to ensure regular bowel movements after surgery. These are available over the counter at your pharmacy. Do not go more than 4 days without utilizing one of these medications to avoid significant constipation.    Dental care: For cleanings or any other dental work, you will need to be pre-medicated with antibiotics for 1 year following surgery.    Please call if you develop prolonged fevers, chills, difficultly with urination or bowel movements (despite trying laxatives, suppositories and enemas), persistent nausea/vomiting, severe pain not relieved with pain medication (some pain is to be expected), or any significant or foul drainage from the wound. And remember, NO SMOKING!!! It inhibits bone fusion and healing. Don't undo all of your hard work at recovery!

## 2015-01-17 NOTE — Progress Notes (Signed)
Surgery PA    S: Pt states he is doing pretty well this morning, he c/o sore throat and says it is very uncomfortable to swallow.  He says he has been coughing most of the night but believes it is due to an upper respiratory infection that he was having symptoms of prior to surgery.  Pt denies CP, SOB, abdominal pain, N/V, new pain, new paresthesias, and calve pain.    O: BP 140/66 mmHg   Pulse 92   Temp(Src) 37.2 C (99 F) (Temporal)   Resp 16   Ht 1.803 m (5\' 11" )   Wt 68.04 kg (150 lb)   BMI 20.93 kg/m2   SpO2 97%  General: Pt awake, laying in bed, in NAD  Neck: dressing in place over incision small amount of strikethrough noted, steri strips in place, incision c/d/i, no ecchymosis, no evidence of hematoma, new dressing applied  Lungs: respirations full and unlabored, few inspiratory wheezes noted throughout  Heart: RRR  Abd: BS, soft, non tender to palpation  Ext: sensation grossly intact, strength equal bilaterally in grip strength, elbow flexion/extension, and shoulder abduction    Recent Results (from the past 24 hour(s))   POCT glucose    Collection Time: 01/16/15 12:37 PM   Result Value Ref Range    Glucose POCT 90 60 - 99 mg/dL   Type and screen    Collection Time: 01/16/15 12:38 PM   Result Value Ref Range    ABO RH Blood Type O RH POS     Antibody Screen Negative    POCT glucose    Collection Time: 01/16/15  5:56 PM   Result Value Ref Range    Glucose POCT 92 60 - 99 mg/dL     A/P: POD #1 s/p ACDF C4-7  -pain management  -DAT  -activity as tolerated  -out of bed with cervical collar  -d/c home today    Haset [place], [place], PA

## 2015-01-17 NOTE — Progress Notes (Signed)
Neurosurgery PA Note    Subjective:  Pt is reportedly doing well.  Denies N /V, SOB, chest pain, new weakness.  Mild difficulty swallowing due to sore throat but is able to swallow secretions without difficulty. Is tolerating PO.  Pain is controlled.  Has hx of regular etoh use.    Pain: Last Nursing documented pain:  0-10 Scale: 6 (01/17/15 0445)  Revised FLACC Score: 0 (01/17/15 0030)      Objective:    Last Filed Vitals    01/17/15 0814   BP: 132/61   Pulse:    Temp: 37.4 C (99.3 F)   Resp:    SpO2: 94%           Component Value Date/Time    HCT 41 12/26/2014 1520       Gen: No acute distress.  Alert.  Speech is clear.  Is wearing [place] J cervical collar.  Resting comfortably  Resp: Unlabored breathing.  Incision: Dressing clean, dry and intact.  Strength: Grip 5/5 on right 5/5 on left.  Dorsiflexion/Plantar flexion 5/5 bilaterally.  Sensation:  UE & LE gross sensation intact bilaterally.      Assessment: POD# 1 S/P ACDF C4-5, C5-6, C6-7    Plan:     - Encourage ambulation   - Brace when ambulating  - PO Pain medication  - Advance diet as tolerated  - reduce or stop etoh use when home  - Discharge home today      Author: Joie Bimler, PA  as of: 01/17/2015  at: 8:22 AM

## 2015-01-17 NOTE — Progress Notes (Signed)
01/16/15 2345   Vitals   BP (!) 89/59 mmHg   Dr. Ovid Curd aware.  IVF bolus ordered.  Will continue to monitor. Demetrius Revel, RN

## 2015-01-17 NOTE — Anesthesia Postprocedure Evaluation (Signed)
Anesthesia Post-Op Note    Patient: Joel Bryan    Procedure(s) Performed:  Procedure Summary     Date Anesthesia Start Anesthesia Stop Room / Location    01/16/15 1502 1755 [id number] / [place] MAIN OR       Procedure Diagnosis Surgeon Attending Anesthesia    ACDF C4-7  (N/A Neck) Cervical spondylosis Carola Rhine, MD Boyce Medici, DO     (Cervical spondylosis [phone])          Anesthesia type:  General  Complications Noted (Any):  None   Comment:    Patient Location:  [facility] Floor (boarding in PACU)  Level of Consciousness:    Recovered to baseline  Patient Participation:     Able to participate  Temperature Status:    Normothermic  Oxygen Saturation:    Within patient's normal range  Cardiac Status:   Within patient's normal range  Fluid Status:    Stable  Airway Patency:     Yes  Pulmonary Status:    Baseline  Pain Management:    Adequate analgesia  Nausea and Vomiting:  None    Post Op Assessment:    Tolerated procedure well and no evidence of recall   Attending Attestation:  All indicated post anesthesia care provided

## 2015-01-17 NOTE — Progress Notes (Signed)
Utilization Management    Level of Care Inpatient as of the date 01/16/15      Deari Sessler E Kooper Chriswell, RN     Pager: [phone]

## 2015-01-17 NOTE — Progress Notes (Signed)
DC instructions given and understood. Pt. Dressed appropriate for discharge. Wife to pick up Rx from pharmacy and pt. To be discharged home.

## 2015-01-17 NOTE — Discharge Summary (Signed)
Name: Joel Bryan MRN: [id number] DOB: [birth year]     Admit Date: 01/16/2015   Date of Discharge: 01/17/2015    Patient was accepted for discharge to              Discharge Attending Physician: Carola Rhine      Hospitalization Summary    CONCISE NARRATIVE: Mr. Melody presented to the hospital for stated procedure, surgical procedure went accordingly.  He progressed well and on post op day #1 he was discharged home in good condition.       OR PROCEDURE: ACDF C4-7      Signed: Cherlynn Kaiser, PA  On: 01/17/2015  at: 7:44 AM

## 2015-01-18 DIAGNOSIS — M5090 Cervical disc disorder, unspecified, unspecified cervical region: Secondary | ICD-10-CM | POA: Diagnosis present

## 2015-01-18 LAB — SURGICAL PATHOLOGY

## 2015-01-19 NOTE — Op Note (Signed)
Operative Note (Surgical Log ID: [id number])       Date of Surgery: 01/16/2015       Surgeons: Juliann Mule) and Role:     Carola Rhine, MD - Primary       Pre-op Diagnosis: Pre-Op Diagnosis Codes:     * Cervical spondylosis [M47.812]       Post-op Diagnosis: Post-Op Diagnosis Codes:     * Cervical spondylosis [M47.812]       Procedure(s) Performed: * Diagnosis form incomplete *       Additional CPT Codes:        Anesthesia Type: General        Fluid Totals:         Estimated Blood Loss: No Data Recorded       Specimens to Pathology:    ID Type Source Tests Collected by Time Destination   A : cervical spine bone and tissue TISSUE Neck SURGICAL PATHOLOGY Netty Starring, RN 01/16/2015 1730           Temporary Implants:        Packing:                 Patient Condition: good       Indications: Cervical spondylosis [M47.812]       Findings (Including unexpected complications): Cervical spondylosis [N46.270]     Description of Procedure: SURGEON:  Carola Rhine, MD   ASSISTANT: Benay Spice, RPA-C   SURGERY DATE:  01/16/2015    PREOPERATIVE DIAGNOSIS: M47.812 Cervical Spondylosis without Myelopathy   POSTOPERATIVE DIAGNOSIS: M47.812  Cervical Spondylosis without Myelopathy     OPERATIVE PROCEDURE: ANTERIOR CERVICAL DISCECTOMIES AND FUSION      1. Anterior cervical discectomy C4-5, C5-6, C6-7  2. Arthrodesis C4-5, C5-6, C6-7  3. Segmental Spinal Instrumentation C4-5, C5-6, C6-7  4. Machined Biomechanical Bioceramic Spacers  5. Intraoperative Neuromonitoring       ANESTHESIA: General endotracheal anesthesia  INDICATION FOR OPERATION / RISKS / ALTERNATIVES:  Failed Non-Operative Care. Patient reports progressively worsening neck and left sided arm pain.  Risks discussed included but were not limited to the risks associated with any spinal surgery including: bleeding, infection, discomfort, stroke, paralysis, death, CSF leak, dural tear, blood clot, blood vessel injury, hardware failure,  hoarseness and trouble swallowing,  failure of fusion and nerve injury. Alternatives include no surgery and posterior cervical decompression.  The patient understands the risks and benefits and wishes to proceed with anterior surgical decompression and fusion.    DESCRIPTION OF PROCEDURE:  The patient was brought to the operating room and was intubated in the supine position.  Head held neutral on the [place] head holder.  Prepped and draped in the usual sterile fashion.  All of the major pathology was noted to be present at C4-5, C5-6 and C6-7.  These were the levels that were addressed surgically.  A transverse incision was made over the C4-5 disc space on the RIGHT hand side, carried this down to the level of the platysma that was divided sharply.  Carried down to the level of the prevertebral fascia that was divided.  The spinal marking needle was placed in the C4-5 level and confirmed radiographically.  Following this self-retaining retractors were placed underneath the longus colli.      1. Anterior cervical discectomy C4-5, C5-6, C6-7  The C4-5, C5-6, C6-7 disc spaces were excised in its entirety along with the cartilaginous end plates. These were removed down to the level of the posterior longitudinal ligament that was  opened.  There were large bony osteophytes protruding back into the neural foramina at the C5-6 and C6-7 levels which were removed in their entirety along with large anterior bony osteophytes.  There was a large bony osteophyte protruding back into the neural foramina at C4-5 which was removed in its entirety along with large anterior bony osteophytes.  End plates were burred flat and parallel. Large anterior osteophyte and disc herniation at C5-6 and C6-7 on the Left  side.  2.  Arthrodesis C4-5, C5-6, C6-7  Arthrodesis was performed of the C4-5 level using a machined biomechanical Bioceramic spacer. This was tamped into position and confirmed radiographically. Arthrodesis was performed of the C5-6 level using a machined  biomechanical Bioceramic spacer. This was tamped into position and confirmed radiographically. Arthrodesis was then performed of the C6-7 level, again using a machined biomechanical Bioceramic spacer. This was tamped into position and confirmed radiographically.    3.  Segmental spinal instrumentation C4-5, C5-6, C6-7  An Atlantis plate was placed with two screws in C4, C5, C6 and two screws in C7 positioned under radiographic guidance. The locking screws were then tightened, locking the plate into position.  4. Machined Biomechanical Bioceramic Spacer  A machined Bioceramic spacer was placed into the C4-5 level, a machined Bioceramic spacer was placed into the C5-6 level and a machined Bioceramic spacer was placed into the C6-7 level, these were tamped into position and provided structural arthrodesis. X-ray confirmed the position.    5.  Intraoperative Neuromonitoring    Leads were applied to the arms and legs and tested for accuracy.  Intraoperative Monitoring was performed with  intermittent  checking for neural structures at each stage of the procedure.  No nerves or nerve roots were identified to  be at risk within the operative field during the dissection, discectomy and  instrumentation phases of the procedure.   Motor Evoked and Somatosensory evoked potentials were assessed throughout the procedure and exhibited no changes.    The Platysma was closed with interrupted 2-0 Vicryl sutures, subcutaneous tissue with 3-0 Vicryl and Steri-Strips were applied to the epidermis.         Signed:  Carola Rhine, MD  on 01/19/2015 at 9:24 AM

## 2015-01-22 ENCOUNTER — Encounter: Payer: Self-pay | Admitting: Neurosurgery

## 2017-04-15 ENCOUNTER — Ambulatory Visit (INDEPENDENT_AMBULATORY_CARE_PROVIDER_SITE_OTHER)
Admission: RE | Admit: 2017-04-15 | Discharge: 2017-04-15 | Disposition: A | Discharge status: Home / Self Care | Payer: 59 | Source: Ambulatory Visit | Attending: Adult Health | Admitting: Adult Health

## 2017-04-15 ENCOUNTER — Encounter: Payer: Self-pay | Admitting: Adult Health

## 2017-04-15 ENCOUNTER — Ambulatory Visit (INDEPENDENT_AMBULATORY_CARE_PROVIDER_SITE_OTHER): Payer: 59 | Admitting: Adult Health

## 2017-04-15 VITALS — BP 118/60 | Temp 98.3°F | Wt 162.0 lb

## 2017-04-15 DIAGNOSIS — J449 Chronic obstructive pulmonary disease, unspecified: Secondary | ICD-10-CM | POA: Diagnosis not present

## 2017-04-15 DIAGNOSIS — J301 Allergic rhinitis due to pollen: Secondary | ICD-10-CM | POA: Diagnosis not present

## 2017-04-15 DIAGNOSIS — R61 Generalized hyperhidrosis: Secondary | ICD-10-CM | POA: Diagnosis not present

## 2017-04-15 DIAGNOSIS — Z7689 Persons encountering health services in other specified circumstances: Secondary | ICD-10-CM | POA: Diagnosis not present

## 2017-04-15 DIAGNOSIS — M7072 Other bursitis of hip, left hip: Secondary | ICD-10-CM | POA: Diagnosis not present

## 2017-04-15 LAB — CBC WITH DIFFERENTIAL/PLATELET
BASOS ABS: 0 10*3/uL (ref 0.0–0.1)
Basophils Relative: 0.6 % (ref 0.0–3.0)
Eosinophils Absolute: 0 10*3/uL (ref 0.0–0.7)
Eosinophils Relative: 1.1 % (ref 0.0–5.0)
HEMATOCRIT: 38.9 % — AB (ref 39.0–52.0)
HEMOGLOBIN: 13.1 g/dL (ref 13.0–17.0)
LYMPHS PCT: 17.2 % (ref 12.0–46.0)
Lymphs Abs: 0.8 10*3/uL (ref 0.7–4.0)
MCHC: 33.7 g/dL (ref 30.0–36.0)
MCV: 97.8 fl (ref 78.0–100.0)
MONOS PCT: 8.1 % (ref 3.0–12.0)
Monocytes Absolute: 0.4 10*3/uL (ref 0.1–1.0)
Neutro Abs: 3.3 10*3/uL (ref 1.4–7.7)
Neutrophils Relative %: 73 % (ref 43.0–77.0)
Platelets: 240 10*3/uL (ref 150.0–400.0)
RBC: 3.98 Mil/uL — AB (ref 4.22–5.81)
RDW: 13 % (ref 11.5–15.5)
WBC: 4.5 10*3/uL (ref 4.0–10.5)

## 2017-04-15 MED ORDER — OMEPRAZOLE 40 MG PO CPDR: 40.0000 mg | DELAYED_RELEASE_CAPSULE | Freq: Every day | ORAL | 3 refills | Status: DC

## 2017-04-15 MED ORDER — IBUPROFEN 800 MG PO TABS: 800.0000 mg | ORAL_TABLET | Freq: Three times a day (TID) | ORAL | 3 refills | Status: DC | PRN

## 2017-04-15 MED ORDER — FLUTICASONE PROPIONATE 50 MCG/ACT NA SUSP: 2.0000 | Freq: Every day | NASAL | 6 refills | Status: DC

## 2017-04-15 MED ORDER — SYMBICORT 160-4.5 MCG/ACT IN AERO: 2.0000 | INHALATION_SPRAY | Freq: Every day | RESPIRATORY_TRACT | 11 refills | Status: DC

## 2017-04-15 MED ORDER — SPIRIVA HANDIHALER 18 MCG IN CAPS: 18.0000 ug | ORAL_CAPSULE | Freq: Every day | RESPIRATORY_TRACT | 11 refills | Status: DC

## 2017-04-15 NOTE — Progress Notes (Signed)
Patient presents to clinic today to establish care. He is a pleasant 61 year old male who  has a past medical history of COPD (chronic obstructive pulmonary disease) ([place]); DDD (degenerative disc disease), lumbar; Depression; Emphysema of lung ([place]); GERD (gastroesophageal reflux disease); Hodgkin's disease ([place]) (1999); Hypotension; Meniere's disease; Pneumonia; and Tinnitus.   Unknown when his last physical was done.   He has recently moved from [place] to [place] to be closer to family   Acute Concerns: [place] - this has been a relatively new issues. Started about one week ago. He has been waking up in the middle of the night "soaking through the sheets." He denies fever or feeling ill. Has not had any sweating during the day. He has been sleeping with the air conditioner on and a fan blowing at him. Denies any productive cough but has had a dry cough as of recently.   Left Hip Pain - was diagnosed with bursitis while in [place], had a steroid shot and had a good response to it. Since moving to [place] his left hip pain has returned. Pain with worse with weight baring activities. He would like to be evaluated for another steroid injection.   Chronic Issues: COPD - uses Spiriva and Symibcort. Was prescribed supplemental oxygen that he uses on occasion by his pulmonologist in [place] -- Did do routine care in [place] -- Had Pneumonia shot in march  Colonoscopy -- December 2017 - 5 years.   He was followed by   Pulmonary - COPD.    Past Medical History:  Diagnosis Date  . COPD (chronic obstructive pulmonary disease) ([place])   . DDD (degenerative disc disease), lumbar   . Depression   . Emphysema of lung ([place])   . GERD (gastroesophageal reflux disease)   . Hodgkin's disease ([place]) 1999  . Hypotension   . Meniere's disease   . Pneumonia   . Tinnitus     Past Surgical History:  Procedure Laterality Date  .  CERVICAL FUSION  2016  . LAMINECTOMY  1999    No current outpatient prescriptions on file prior to visit.   No current facility-administered medications on file prior to visit.     No Known Allergies  Family History  Problem Relation Age of Onset  . Hypertension Father     Social History   Social History  . Marital status: Married    Spouse name: N/A  . Number of children: N/A  . Years of education: N/A   Occupational History  . Not on file.   Social History Main Topics  . Smoking status: Former Research scientist (life sciences)  . Smokeless tobacco: Never Used  . Alcohol use Yes     Comment: "4-6 beers daily"  . Drug use: No  . Sexual activity: Not on file   Other Topics Concern  . Not on file   Social History Narrative   Retied - worked as Furniture conservator/restorer           Review of Systems  Constitutional: Positive for diaphoresis.  HENT: Positive for hearing loss and tinnitus.   Eyes: Negative.   Respiratory: Positive for cough. Negative for shortness of breath and wheezing.   Cardiovascular: Negative.   Gastrointestinal: Negative.   Genitourinary: Negative.   Musculoskeletal: Negative.   Skin: Negative.   Neurological: Negative.   Psychiatric/Behavioral: Negative.     BP 118/60 (BP Location: Left Arm, Patient Position: Sitting, Cuff  Size: Normal)   Temp 98.3 F (36.8 C) (Oral)   Wt 162 lb (73.5 kg)   Physical Exam  Constitutional: He is oriented to person, place, and time and well-developed, well-nourished, and in no distress. No distress.  HENT:  Head: Normocephalic and atraumatic.  Right Ear: External ear normal.  Left Ear: External ear normal.  Nose: Nose normal.  Mouth/Throat: Oropharynx is clear and moist. No oropharyngeal exudate.  Eyes: Conjunctivae and EOM are normal. Pupils are equal, round, and reactive to light. Right eye exhibits no discharge. Left eye exhibits no discharge. No scleral icterus.  Neck: Normal range of motion. Neck supple.  Cardiovascular: Normal rate,  regular rhythm, normal heart sounds and intact distal pulses.  Exam reveals no gallop and no friction rub.   No murmur heard. Pulmonary/Chest: Effort normal and breath sounds normal. No respiratory distress. He has no wheezes. He has no rales. He exhibits no tenderness.  Abdominal: Soft. Bowel sounds are normal. He exhibits no distension and no mass. There is no tenderness. There is no rebound and no guarding.  Musculoskeletal: Normal range of motion. He exhibits no edema or deformity.  Lymphadenopathy:    He has no cervical adenopathy.  Neurological: He is alert and oriented to person, place, and time. Gait normal. GCS score is 15.  Skin: Skin is warm and dry. No rash noted. He is not diaphoretic. No erythema. No pallor.  Psychiatric: Mood, memory, affect and judgment normal.  Nursing note and vitals reviewed.  Assessment/Plan: 1. Encounter to establish care - Will request records from previous PCP - Follow up for CPE or sooner if needed  2. Chronic obstructive pulmonary disease, unspecified COPD type ([place]) - Ambulatory referral to Pulmonology - Inhalers refilled 3. Bursitis of left hip, unspecified bursa  - Ambulatory referral to Sports Medicine  4. Night sweats  - CBC with Differential/Platelet - DG Chest 2 View; Future  5. Seasonal allergic rhinitis due to pollen  - fluticasone (FLONASE) 50 MCG/ACT nasal spray; Place 2 sprays into both nostrils daily.  Dispense: 16 g; Refill: 6  [name], NP

## 2017-04-15 NOTE — Patient Instructions (Signed)
It was great meeting you today   Someone from Sports Medicine and Pulmonary will call you to schedule your appointment   I will follow up with you once your blood work and chest x ray are back   Follow up with me for your physical this summer

## 2017-05-01 ENCOUNTER — Encounter: Payer: Self-pay | Admitting: Sports Medicine

## 2017-05-01 ENCOUNTER — Ambulatory Visit: Payer: Self-pay

## 2017-05-01 ENCOUNTER — Ambulatory Visit (INDEPENDENT_AMBULATORY_CARE_PROVIDER_SITE_OTHER): Payer: 59 | Admitting: Sports Medicine

## 2017-05-01 VITALS — BP 120/70 | HR 74 | Ht 69.0 in | Wt 164.0 lb

## 2017-05-01 DIAGNOSIS — M67952 Unspecified disorder of synovium and tendon, left thigh: Secondary | ICD-10-CM | POA: Diagnosis not present

## 2017-05-01 DIAGNOSIS — Z87891 Personal history of nicotine dependence: Secondary | ICD-10-CM | POA: Insufficient documentation

## 2017-05-01 DIAGNOSIS — M25552 Pain in left hip: Secondary | ICD-10-CM

## 2017-05-01 DIAGNOSIS — Z720 Tobacco use: Secondary | ICD-10-CM | POA: Diagnosis not present

## 2017-05-01 MED ORDER — NITROGLYCERIN 0.2 MG/HR TD PT24: MEDICATED_PATCH | TRANSDERMAL | 1 refills | Status: DC

## 2017-05-01 NOTE — Patient Instructions (Signed)

## 2017-05-01 NOTE — Progress Notes (Signed)
OFFICE VISIT NOTE Chase Lloyd, Chase Lloyd at [facility]  Chase Lloyd - 61 y.o. male MRN [id number]  Date of birth: Jan 17, 1956  Visit Date: 05/01/2017  PCP: Chase Peng, NP   Referred by: Chase Peng, NP  Chase Lloyd, cma acting as scribe for Dr. Paulla Fore.  SUBJECTIVE:   Chief Complaint  Patient presents with  . Follow-up  . Left Hip Bursitis    HPI: As below and per problem based documentation when appropriate.   [name] was diagnosed with bursitis while in [place], had a steroid shot and had a good response to it approximaetly 6 months ago. Since moving to [place] his left hip pain has returned. Pain is triggered after walking for a Robin period of time. No redness of swelling. He takes Ibuprofen as needed.    Review of Systems  Constitutional: Negative for chills, diaphoresis, fever, malaise/fatigue and weight loss.  HENT: Negative.   Eyes: Negative.   Cardiovascular: Negative.   Gastrointestinal: Negative.   Genitourinary: Negative.   Musculoskeletal: Positive for joint pain and myalgias.  Skin: Negative for itching and rash.  Neurological: Negative.  Negative for weakness.  Endo/Heme/Allergies: Negative for environmental allergies and polydipsia. Does not bruise/bleed easily.  Psychiatric/Behavioral: Negative.     Otherwise per HPI.  HISTORY & PERTINENT PRIOR DATA:  No specialty comments available. He reports that he has been smoking.  He has never used smokeless tobacco. No results for input(s): HGBA1C, LABURIC in the last 8760 hours. Medications & Allergies reviewed per EMR Patient Active Problem List   Diagnosis Date Noted  . Tendinopathy of left gluteus medius 05/01/2017  . Tobacco use 05/01/2017   Past Medical History:  Diagnosis Date  . COPD (chronic obstructive pulmonary disease) ([place])   . DDD (degenerative disc disease), lumbar   . Depression   . Emphysema of lung ([place])   . Empyema ([place])   .  GERD (gastroesophageal reflux disease)   . Hodgkin's disease ([place]) 1999  . Hypotension   . Meniere's disease   . Pneumonia   . Tinnitus    Family History  Problem Relation Age of Onset  . Hypertension Father    Past Surgical History:  Procedure Laterality Date  . CERVICAL FUSION  2016  . LAMINECTOMY  1999   Social History   Occupational History  . Not on file.   Social History Main Topics  . Smoking status: Current Every Day Smoker  . Smokeless tobacco: Never Used  . Alcohol use Yes     Comment: "4-6 beers daily"  . Drug use: No  . Sexual activity: Not on file    OBJECTIVE:  VS:  HT:5\' 9"  (175.3 cm)   WT:164 lb (74.4 kg)  BMI:24.3    BP:120/70  HR:74bpm  TEMP: ( )  RESP:96 % EXAM: Findings:  WDWN, NAD, Non-toxic appearing Alert & appropriately interactive Not depressed or anxious appearing No increased work of breathing. Pupils are equal. EOM intact without nystagmus No clubbing or cyanosis of the extremities appreciated No significant rashes/lesions/ulcerations overlying the examined area. DP & PT pulses 2+/4.  No significant pretibial edema. Sensation intact to light touch in lower extremities.  Left hip: Overall well aligned.  He is excellent internal and external rotation of the hip.  He has markedly T FL predominant hip abduction with the leg and forward flexed position.  He has weakness when his leg is held in neutral reflecting glute medius weakness.  No significant  pain with Corky Sox or FADIR.  Moderate TTP over the greater trochanter this is worse just proximal to the actual trochanter of the muscle belly of the glute medius.     No results found. ASSESSMENT & PLAN:     ICD-10-CM   1. Left hip pain M25.552 [name] LIMITED JOINT SPACE STRUCTURES LOW LEFT(NO LINKED CHARGES)  2. Tendinopathy of left gluteus medius M67.952   3. Tobacco use Z72.0   ================================================================= Tendinopathy of left gluteus medius Symptoms  are consistent with glute medius tendinopathy.  Therapeutic exercises provided including hip abduction.  Nitroglycerin protocol discussed to promote healing.  If any lack of improvement can consider alternative treatments including PRP, Tenex.  If any worsening radicular type symptoms could consider further evaluation of the lumbar spine.  Seem to be quite focal.   Tobacco use Encouraged him to quit smoking discussed the implications for delayed healing secondary to smoking as well as increased pain levels. =================================================================  Follow-up: Return in about 6 weeks (around 06/12/2017).   CMA/ATC served as Education administrator during this visit. History, Physical, and Plan performed by medical provider. Documentation and orders reviewed and attested to.      Chase Lloyd, [place] [facility] Physician

## 2017-05-29 ENCOUNTER — Ambulatory Visit (INDEPENDENT_AMBULATORY_CARE_PROVIDER_SITE_OTHER): Payer: 59 | Admitting: Internal Medicine

## 2017-05-29 ENCOUNTER — Encounter: Payer: Self-pay | Admitting: Internal Medicine

## 2017-05-29 VITALS — BP 138/62 | HR 97 | Ht 70.0 in | Wt 168.0 lb

## 2017-05-29 DIAGNOSIS — J449 Chronic obstructive pulmonary disease, unspecified: Secondary | ICD-10-CM

## 2017-05-29 DIAGNOSIS — J441 Chronic obstructive pulmonary disease with (acute) exacerbation: Secondary | ICD-10-CM | POA: Insufficient documentation

## 2017-05-29 DIAGNOSIS — F17210 Nicotine dependence, cigarettes, uncomplicated: Secondary | ICD-10-CM

## 2017-05-29 MED ORDER — TIOTROPIUM BROMIDE MONOHYDRATE 2.5 MCG/ACT IN AERS: 2.0000 | INHALATION_SPRAY | Freq: Every day | RESPIRATORY_TRACT | 0 refills | Status: DC

## 2017-05-29 MED ORDER — TIOTROPIUM BROMIDE MONOHYDRATE 2.5 MCG/ACT IN AERS: 2.0000 | INHALATION_SPRAY | Freq: Every day | RESPIRATORY_TRACT | 11 refills | Status: DC

## 2017-05-29 MED ORDER — ALBUTEROL SULFATE HFA 108 (90 BASE) MCG/ACT IN AERS: 2.0000 | INHALATION_SPRAY | Freq: Four times a day (QID) | RESPIRATORY_TRACT | Status: DC | PRN

## 2017-05-29 NOTE — Progress Notes (Signed)
Subjective:    Patient ID: Chase Lloyd, male    DOB: 01-22-1956,     MRN: [id number]  HPI  87 yowm quit smoking 03/2017 first aware of doe 2013 dx as copd rx symbicort helped a lot then pna/ sepsis Feb 2017 and added spiriva and R parapneumonic after a month and rx 02 short term and moved to [place] may 2018 and referred to pulmonary clinic 05/29/2017 by [name] with GOLD III criteria at initial eval     05/29/2017 1st [facility] office visit/ Brodi Nery  Maint rx symb 160 2bid / spiriva dpi  Chief Complaint  Patient presents with  . Pulmonary Consult    Pt referred by Dr. [name] for COPD. Pt c/o DOE, prod cough with yellow mucus. Pt denies CP/tightness and f/c/s. Pt states he rarely uses his albuterol hfa.   ok at rest, sleeps fine with improving cough since quit smoking  Left leg swelling x months assoc with hip pain > ortho following with 'neg sonogram"  per pt   No obvious day to day or daytime variability or assoc excess/ purulent sputum or mucus plugs or hemoptysis or cp or chest tightness, subjective wheeze or overt sinus  symptoms. No unusual exp hx or h/o childhood pna/ asthma or knowledge of premature birth.  Sleeping ok without nocturnal  or early am exacerbation  of respiratory  c/o's or need for noct saba. Also denies any obvious fluctuation of symptoms with weather or environmental changes or other aggravating or alleviating factors except as outlined above   Current Medications, Allergies, Complete Past Medical History, Past Surgical History, Family History, and Social History were reviewed in [employer] record.             Review of Systems  Constitutional: Negative for fever and unexpected weight change.  HENT: Negative for congestion, dental problem, ear pain, nosebleeds, postnasal drip, rhinorrhea, sinus pressure, sneezing, sore throat and trouble swallowing.   Eyes: Negative for redness and itching.  Respiratory: Positive for cough  and shortness of breath. Negative for chest tightness and wheezing.   Cardiovascular: Negative for palpitations and leg swelling.  Gastrointestinal: Negative for nausea and vomiting.  Genitourinary: Negative for dysuria.  Musculoskeletal: Negative for joint swelling.  Skin: Negative for rash.  Neurological: Negative for headaches.  Hematological: Does not bruise/bleed easily.  Psychiatric/Behavioral: Negative for dysphoric mood. The patient is not nervous/anxious.        Objective:   Physical Exam   amb wm nad   Wt Readings from Last 3 Encounters:  05/29/17 168 lb (76.2 kg)  05/01/17 164 lb (74.4 kg)  04/15/17 162 lb (73.5 kg)    Vital signs reviewed  - Note on arrival 02 sats  97% on RA    HEENT: nl dentition, turbinates bilaterally, and oropharynx. Nl external ear canals without cough reflex   NECK :  without JVD/Nodes/TM/ nl carotid upstrokes bilaterally   LUNGS: no acc muscle use,  slt barrel contour with distant bs bilaterally, hyperresonant to percussion s wheeze   CV:  RRR  no s3 or murmur or increase in P2, and trace edema L >R  Ankle   ABD:  soft and nontender with  Pos hoover's late in inspiration in the supine position. No bruits or organomegaly appreciated, bowel sounds nl  MS:  Nl gait/ ext warm without deformities, calf tenderness, cyanosis or clubbing No obvious joint restrictions   SKIN: warm and dry without lesions    NEURO:  alert, approp, nl sensorium with  no motor or cerebellar deficits apparent.      I personally reviewed images and agree with radiology impression as follows:  CXR:   04/15/17  The lungs are hyperinflated likely secondary to COPD. There is chronic right apical scarring. There is mild right middle lobe scarring. There is no focal consolidation. There is no pleural effusion or pneumothorax. The heart and mediastinal contours are unremarkable.       Assessment & Plan:

## 2017-05-29 NOTE — Patient Instructions (Addendum)
Plan A = Automatic = Symbicort 160 Take 2 puffs first thing in am and then another 2 puffs about 12 hours later and spiriva 2 puffs in am only (ok to use up the powder form if you want)   Work on inhaler technique:  relax and gently blow all the way out then take a nice smooth deep breath back in, triggering the inhaler at same time you start breathing in.  Hold for up to 5 seconds if you can. Blow out thru nose. Rinse and gargle with water when done      Plan B = Backup Only use your albuterol as a rescue medication to be used if you can't catch your breath by resting or doing a relaxed purse lip breathing pattern.  - The less you use it, the better it will work when you need it. - Ok to use the inhaler up to 2 puffs  every 4 hours if you must but call for appointment if use goes up over your usual need - Don't leave home without it !!  (think of it like the spare tire for your car)   Please schedule a follow up office visit in 6 weeks, call sooner if needed with pfts on return

## 2017-05-29 NOTE — Assessment & Plan Note (Addendum)
Quit smoking 03/2017 -  Spirometry 05/29/2017  FEV1 1.37 (39%)  Ratio 47 p am symb x2 and spirva x one and abn effort dep portion  - 05/29/2017  After extensive coaching device  effectiveness =    90% with smi and hfa > rec keep spiriva as respimat and symb 160 2bid s spacer    He has severe airflow obst at baseline but just quit smoking x 2 months and not using inhalers consistently correctly with multiple devices = spacers with hfa symb but not hfa saba/ dpi spiriva so rec   1) keep it simple with all hfa/smi and no spacers   2) return with all meds in hand using a trust but verify approach to confirm accurate Medication  Reconciliation The principal here is that until we are certain that the  patients are doing what we've asked, it makes no sense to ask them to do more.   3) maint off cigs I reviewed the Fletcher curve with the patient that basically indicates  if you quit smoking when your best day FEV1 is still   preserved (as is  Only relatively  the case here)  it is highly unlikely you will progress to severe disease and informed the patient there was  no medication on the market that has proven to alter the curve/ its downward trajectory  or the likelihood of progression of their disease(unlike other chronic medical conditions such as atheroclerosis where we do think we can change the natural hx with risk reducing meds)    Therefore stopping smoking and maintaining abstinence is the most important aspect of care, not choice of inhalers or for that matter, doctors.    4) f/u with pfts in 6 weeks  Total time devoted to counseling  > 50 % of initial 60 min office visit:  review case with pt/ discussion of options/alternatives/ personally creating written customized instructions  in presence of pt  then going over those specific  Instructions directly with the pt including how to use all of the meds but in particular covering each new medication in detail and the difference between the maintenance=  "automatic" meds and the prns using an action plan format for the latter (If this problem/symptom => do that organization reading Left to right).  Please see AVS from this visit for a full list of these instructions which I personally wrote for this pt and  are unique to this visit.

## 2017-05-29 NOTE — Assessment & Plan Note (Signed)
Symptoms are consistent with glute medius tendinopathy.  Therapeutic exercises provided including hip abduction.  Nitroglycerin protocol discussed to promote healing.  If any lack of improvement can consider alternative treatments including PRP, Tenex.  If any worsening radicular type symptoms could consider further evaluation of the lumbar spine.  Seem to be quite focal.

## 2017-05-29 NOTE — Assessment & Plan Note (Signed)
Encouraged him to quit smoking discussed the implications for delayed healing secondary to smoking as well as increased pain levels.

## 2017-05-29 NOTE — Procedures (Signed)
LIMITED MSK ULTRASOUND OF Left Hip Images were obtained and interpreted by myself, Teresa Coombs, DO  Images have been saved and stored to PACS system. Images obtained on: GE S7 Ultrasound machine  FINDINGS:   Images of the left greater trochanter and glute medius tendons were obtained that showed increased interstitial fluid as well as a small amount of swelling is consistent with mild bursitis.  He does have what appears to be a horizontal partial thickness tear within the glute medius tendon. IMPRESSION:  1. Left glute medius tendinopathy with partial tearing and small amount of bursal swelling.

## 2017-06-12 ENCOUNTER — Ambulatory Visit (INDEPENDENT_AMBULATORY_CARE_PROVIDER_SITE_OTHER): Payer: 59

## 2017-06-12 ENCOUNTER — Other Ambulatory Visit: Payer: Self-pay

## 2017-06-12 ENCOUNTER — Ambulatory Visit (INDEPENDENT_AMBULATORY_CARE_PROVIDER_SITE_OTHER): Payer: 59 | Admitting: Sports Medicine

## 2017-06-12 ENCOUNTER — Encounter: Payer: Self-pay | Admitting: Sports Medicine

## 2017-06-12 VITALS — BP 128/58 | HR 64 | Ht 70.0 in | Wt 172.0 lb

## 2017-06-12 DIAGNOSIS — M25559 Pain in unspecified hip: Secondary | ICD-10-CM | POA: Diagnosis not present

## 2017-06-12 DIAGNOSIS — M47816 Spondylosis without myelopathy or radiculopathy, lumbar region: Secondary | ICD-10-CM

## 2017-06-12 DIAGNOSIS — I739 Peripheral vascular disease, unspecified: Secondary | ICD-10-CM

## 2017-06-12 NOTE — Assessment & Plan Note (Signed)
Intermittent claudication symptoms concerning for potential vascular versus neurogenic claudication.  We will begin with vascular workup given prior extensive smoking history and irregularity of the pulses appreciated today.  He does have prior history of lumbar laminectomy and may have some contribution from a neurogenic etiology this will be evaluated with MRI if vascular etiology is nonrevealing.

## 2017-06-12 NOTE — Patient Instructions (Signed)
We are going to order an arterial Doppler to evaluate the blood flow in your legs.  If this test is normal the next test I would recommend would be an MRI of your low back.  We will be in touch with the results from the blood flow test first and discuss at that time what to do next.  It is okay to discontinue the nitroglycerin at this time.

## 2017-06-12 NOTE — Progress Notes (Signed)
OFFICE VISIT NOTE Juanda Bond. [place], [place] at [place]  LAWRNCE REYEZ - 61 y.o. male MRN [id number]  Date of birth: [birth year]  Visit Date: 06/12/2017  PCP: [name], NP   Referred by: [name], NP  Burlene Arnt, CMA acting as scribe for Dr. Paulla Fore.  SUBJECTIVE:   Chief Complaint  Patient presents with  . Follow-up    left hip pain   HPI: As below and per problem based documentation when appropriate.  Mr. Emberton is an established patient presenting today in follow-up of left hip pain. He had steroid injection about 8 months ago in [place] and got some relief. At his last visit he was given home exercises and advised to follow Nitrol Protocol.   Pt has been following Nitro Protocol and reports no relief in sx. He has been doing home exercises and has trouble while standing on one leg, he loses his balance. Pt denies groin pain and lower back pain. He feel tightness in both legs. He feels like he has to pick his legs up to put them in the car. He has a lot of weakness in both legs. He gets occasional cramping in his calves. He denies pain in his feet but he has noticed that his feet are always swollen. He reports that he gets muscle spasms in his legs "like your phone is on your leg and on vibrate".     Review of Systems  Constitutional: Negative for chills and fever.  Respiratory: Positive for shortness of breath (COPD). Negative for wheezing.   Cardiovascular: Positive for leg swelling. Negative for chest pain and palpitations.  Musculoskeletal: Positive for joint pain and myalgias. Negative for falls.  Neurological: Negative for dizziness, tingling and headaches.  Endo/Heme/Allergies: Does not bruise/bleed easily.    Otherwise per HPI.  HISTORY & PERTINENT PRIOR DATA:  No specialty comments available. He reports that he quit smoking about 3 months ago. His smoking use included Cigarettes. He has a 70.50  pack-year smoking history. He has never used smokeless tobacco. No results for input(s): HGBA1C, LABURIC in the last 8760 hours. Medications & Allergies reviewed per EMR Patient Active Problem List   Diagnosis Date Noted  . Intermittent claudication ([place]) 06/12/2017  . Lumbar spondylosis 06/12/2017  . COPD GOLD III 05/29/2017  . Tendinopathy of left gluteus medius 05/01/2017  . Cigarette smoker 05/01/2017   Past Medical History:  Diagnosis Date  . COPD (chronic obstructive pulmonary disease) ([place])   . DDD (degenerative disc disease), lumbar   . Depression   . Emphysema of lung ([place])   . Empyema ([place])   . GERD (gastroesophageal reflux disease)   . Hodgkin's disease ([place]) 1999  . Hypotension   . Meniere's disease   . Pneumonia   . Tinnitus    Family History  Problem Relation Age of Onset  . Hypertension Father   . Diabetes Father   . Testicular cancer Brother   . Melanoma Brother    Past Surgical History:  Procedure Laterality Date  . CERVICAL FUSION  2016  . ELBOW SURGERY    . LAMINECTOMY  1999  . lower back surgery     Social History   Occupational History  . Retired    Social History Main Topics  . Smoking status: Former Smoker    Packs/day: 1.50    Years: 47.00    Types: Cigarettes    Quit date: 03/03/2017  . Smokeless tobacco: Never Used  .  Alcohol use Yes     Comment: "4-6 beers daily"  . Drug use: No  . Sexual activity: Not on file    OBJECTIVE:  VS:  HT:5\' 10"  (177.8 cm)   WT:172 lb (78 kg)  BMI:24.7    BP:(!) 128/58  HR:64bpm  TEMP: ( )  RESP:96 % EXAM: Findings:  Bilateral lower extremities overall well aligned.  He has good internal and external rotation of bilateral hips.  He has a negative straight leg raise bilaterally.  Lower extremity strength is 5+ out of 5 with myotome testing.  Lower extremity DTRs are 2+/4 diffusely.  DP pulses are trace on the right and 1+/4 on the left, posterior tibialis pulses are 3+/4 bilaterally.  Capillary  refill is 5 seconds bilaterally.  Popliteal pulses are  not appreciated.  Aorta is nonpalpable.  He has a normal Babinski.     No results found. ASSESSMENT & PLAN:     ICD-10-CM   1. Arthralgia of hip, unspecified laterality M25.559 DG Lumbar Spine 2-3 Views  2. Intermittent claudication (HCC) I73.9 VAS [name] LE ART SEG MULTI (Segm&LE Reynauds)  3. Lumbar spondylosis M47.816   ================================================================= Intermittent claudication (HCC) Intermittent claudication symptoms concerning for potential vascular versus neurogenic claudication.  We will begin with vascular workup given prior extensive smoking history and irregularity of the pulses appreciated today.  He does have prior history of lumbar laminectomy and may have some contribution from a neurogenic etiology this will be evaluated with MRI if vascular etiology is nonrevealing. ================================================================= Patient Instructions  We are going to order an arterial Doppler to evaluate the blood flow in your legs.  If this test is normal the next test I would recommend would be an MRI of your low back.  We will be in touch with the results from the blood flow test first and discuss at that time what to do next.  It is okay to discontinue the nitroglycerin at this time. =================================================================  Follow-up: Return if symptoms worsen or fail to improve.   CMA/ATC served as Education administrator during this visit. History, Physical, and Plan performed by medical provider. Documentation and orders reviewed and attested to.      Teresa Coombs, [facility] Physician

## 2017-06-19 ENCOUNTER — Telehealth: Payer: Self-pay | Admitting: Internal Medicine

## 2017-06-19 ENCOUNTER — Telehealth: Payer: Self-pay | Admitting: Sports Medicine

## 2017-06-19 ENCOUNTER — Other Ambulatory Visit: Payer: Self-pay

## 2017-06-19 DIAGNOSIS — M47816 Spondylosis without myelopathy or radiculopathy, lumbar region: Secondary | ICD-10-CM

## 2017-06-19 DIAGNOSIS — I739 Peripheral vascular disease, unspecified: Secondary | ICD-10-CM

## 2017-06-19 MED ORDER — TIOTROPIUM BROMIDE MONOHYDRATE 2.5 MCG/ACT IN AERS: 2.0000 | INHALATION_SPRAY | Freq: Every day | RESPIRATORY_TRACT | 11 refills | Status: DC

## 2017-06-19 NOTE — Telephone Encounter (Signed)
Patient's wife called in reference to patient supposed to be getting blood flow test done on legs. Patient's wife stated patient has gotten worse and would like to know what the next step is. Please call patient's wife and advise. OK to leave message.

## 2017-06-19 NOTE — Telephone Encounter (Signed)
Called pt and advised.  

## 2017-06-19 NOTE — Telephone Encounter (Signed)
Called [phone] and left VM for pt to call the office.

## 2017-06-19 NOTE — Telephone Encounter (Signed)
Called and spoke with pt. Pt is requesting Rx for Sprivia Respimat 2.5. It appears that pt was given a Rx at his OV with MW on 05/29/16 for Spiriva 2.5 with 11 refills. Pt states he was not made aware of this, as he thought Rx would be called into his pharmacy. Pt states Rx may be with his AVS at home. Pt states he will look when he gets home, as he is not home currently. I have advised pt to give [name] a call back if unable to locate Rx.

## 2017-06-19 NOTE — Telephone Encounter (Signed)
Spoke with pt, he has slight increased warmth in his calves, pain when walking, constant swelling. He denies redness. He is scheduled for vasc imaging this coming Monday.

## 2017-06-19 NOTE — Telephone Encounter (Signed)
If he is having pain in his calves and persistent swelling that is not improving overnight he should be seen in the emergency department since we will not be able to appropriately coordinate his care is late on Friday.  This should not wait to be evaluated until next week.

## 2017-06-19 NOTE — Telephone Encounter (Signed)
Called spoke with patient's spouse Velta Addison and verified medication name and pharmacy Called [facility] on [address] and spoke with pharmacist > Rx given verbally, as originally prescribed by MW Med list updated  Nothing further needed at this time; will sign off

## 2017-06-22 ENCOUNTER — Ambulatory Visit (HOSPITAL_COMMUNITY)
Admission: RE | Admit: 2017-06-22 | Discharge: 2017-06-22 | Disposition: A | Discharge status: Home / Self Care | Payer: Medicare Other | Source: Ambulatory Visit | Attending: Surgery | Admitting: Surgery

## 2017-06-22 DIAGNOSIS — I739 Peripheral vascular disease, unspecified: Secondary | ICD-10-CM

## 2017-06-23 MED ORDER — GABAPENTIN 300 MG PO CAPS: ORAL_CAPSULE | ORAL | 1 refills | Status: DC

## 2017-06-23 NOTE — Addendum Note (Signed)
Addended by: Teresa Coombs D on: 06/23/2017 04:48 PM   Modules accepted: Orders

## 2017-06-23 NOTE — Telephone Encounter (Signed)
Received the results of his ABIs which are in epic but were faxed to me. Right index is 0.99 for the posterior tibialis and 0.94 for the dorsalis pedis.  His left shows an index of 1.08 for the posterior tibialis and 1.00 for the dorsalis pedis.  Given the persistent ongoing pain in his prior history of lumbar laminectomy MRI of the lumbar spine indicated at this time and with contrast.  Please have him scheduled this and plan to follow-up after this is obtained.  Additionally  I have sent and a prescription for gabapentin to see if this is helpful for his pain in the interim.

## 2017-06-24 ENCOUNTER — Other Ambulatory Visit: Payer: Self-pay

## 2017-06-24 DIAGNOSIS — M47816 Spondylosis without myelopathy or radiculopathy, lumbar region: Secondary | ICD-10-CM

## 2017-06-24 NOTE — Telephone Encounter (Signed)
Will wait for call back from pt if he needs the refill of the respimat.  Will sign off at this time.

## 2017-06-24 NOTE — Telephone Encounter (Signed)
Called pt and advised. He agrees to MRI and OK to send it to [facility].

## 2017-06-25 ENCOUNTER — Telehealth: Payer: Self-pay | Admitting: Sports Medicine

## 2017-06-25 ENCOUNTER — Other Ambulatory Visit: Payer: Self-pay

## 2017-06-25 DIAGNOSIS — M47816 Spondylosis without myelopathy or radiculopathy, lumbar region: Secondary | ICD-10-CM

## 2017-06-25 NOTE — Telephone Encounter (Signed)
New order placed. Called Mardene Celeste and left VM advising new order has been placed (OK per Dr. Paulla Fore).

## 2017-06-25 NOTE — Telephone Encounter (Signed)
Chase Lloyd with [facility] called in reference to needing orders changed for MRI. Chase Lloyd stated they either do MRI with and without contrast or just MRI lumbar without contrast. Please contact [facility] and advise.

## 2017-07-09 ENCOUNTER — Ambulatory Visit
Admission: RE | Admit: 2017-07-09 | Discharge: 2017-07-09 | Disposition: A | Discharge status: Home / Self Care | Payer: Medicare Other | Source: Ambulatory Visit | Attending: Sports Medicine | Admitting: Sports Medicine

## 2017-07-09 DIAGNOSIS — M48061 Spinal stenosis, lumbar region without neurogenic claudication: Secondary | ICD-10-CM | POA: Diagnosis not present

## 2017-07-09 DIAGNOSIS — M47816 Spondylosis without myelopathy or radiculopathy, lumbar region: Secondary | ICD-10-CM

## 2017-07-09 MED ORDER — GADOBENATE DIMEGLUMINE 529 MG/ML IV SOLN
15.0000 mL | Freq: Once | INTRAVENOUS | Status: AC | PRN
  Administered 2017-07-09: 15 mL via INTRAVENOUS

## 2017-07-16 ENCOUNTER — Other Ambulatory Visit: Payer: Self-pay | Admitting: Adult Health

## 2017-07-16 NOTE — Telephone Encounter (Signed)
Pt request refill  nitroGLYCERIN (NITRODUR - DOSED IN MG/24 HR) 0.2 mg/hr patch  [facility] [address] [place] [address]

## 2017-07-17 ENCOUNTER — Ambulatory Visit: Payer: 59 | Admitting: Internal Medicine

## 2017-07-23 ENCOUNTER — Ambulatory Visit: Payer: Medicare Other | Admitting: Sports Medicine

## 2017-07-27 ENCOUNTER — Ambulatory Visit (INDEPENDENT_AMBULATORY_CARE_PROVIDER_SITE_OTHER): Payer: Medicare Other | Admitting: Sports Medicine

## 2017-07-27 ENCOUNTER — Encounter: Payer: Self-pay | Admitting: Sports Medicine

## 2017-07-27 VITALS — BP 124/60 | HR 86 | Wt 173.0 lb

## 2017-07-27 DIAGNOSIS — I739 Peripheral vascular disease, unspecified: Secondary | ICD-10-CM | POA: Diagnosis not present

## 2017-07-27 DIAGNOSIS — M47816 Spondylosis without myelopathy or radiculopathy, lumbar region: Secondary | ICD-10-CM | POA: Diagnosis not present

## 2017-07-27 DIAGNOSIS — Z720 Tobacco use: Secondary | ICD-10-CM | POA: Diagnosis not present

## 2017-07-27 DIAGNOSIS — M25552 Pain in left hip: Secondary | ICD-10-CM | POA: Diagnosis not present

## 2017-07-27 DIAGNOSIS — M67952 Unspecified disorder of synovium and tendon, left thigh: Secondary | ICD-10-CM

## 2017-07-27 DIAGNOSIS — F17210 Nicotine dependence, cigarettes, uncomplicated: Secondary | ICD-10-CM

## 2017-07-27 DIAGNOSIS — J449 Chronic obstructive pulmonary disease, unspecified: Secondary | ICD-10-CM

## 2017-07-27 NOTE — Progress Notes (Signed)
OFFICE VISIT NOTE Juanda Bond. [place], [place] at [facility]  DEMONTEZ NOVACK - 61 y.o. male MRN [id number]  Date of birth: December 15, 1955  Visit Date: 07/27/2017  PCP: [name], NP   Referred by: [name], NP  Burlene Arnt, CMA acting as scribe for Dr. Paulla Fore.  SUBJECTIVE:   Chief Complaint  Patient presents with  . Follow-up    low back pain   HPI: As below and per problem based documentation when appropriate.  Mr. Jha is an established patient presenting today in follow-up of low back pain.  He has MRI L-spine 07/09/2017. Today he would like to discuss the results of the MRI as well as the treatment recommendations.   Pt reports continued worsening low back pain. He is still having a hard time lifting his legs, be believes that this is getting worse. He cannot walk very far without pain. He feels like the more he walks or if he has to carry something the pain is worse. Pain does feel better if he leans on a cart. He has also been having pain in the left hip, this is where the pain actually started 1-2 years ago. He has noticed an increase in night sweats, he prespires so much that the sheets are soaked when he gets up.     Review of Systems  Constitutional: Negative for chills and fever.  Respiratory: Positive for shortness of breath. Negative for wheezing.   Cardiovascular: Negative for leg swelling and PND.  Musculoskeletal: Negative for falls and neck pain.  Neurological: Negative for dizziness, tingling and headaches.  Endo/Heme/Allergies: Does not bruise/bleed easily.    Otherwise per HPI.  HISTORY & PERTINENT PRIOR DATA:  No specialty comments available. He reports that he quit smoking about 4 months ago. His smoking use included Cigarettes. He has a 70.50 pack-year smoking history. He has never used smokeless tobacco. No results for input(s): HGBA1C, LABURIC in the last 8760 hours. Medications & Allergies  reviewed per EMR Patient Active Problem List   Diagnosis Date Noted  . Intermittent claudication ([place]) 06/12/2017  . Lumbar spondylosis 06/12/2017  . COPD GOLD III 05/29/2017  . Tendinopathy of left gluteus medius 05/01/2017  . Cigarette smoker 05/01/2017   Past Medical History:  Diagnosis Date  . COPD (chronic obstructive pulmonary disease) ([place])   . DDD (degenerative disc disease), lumbar   . Depression   . Emphysema of lung ([place])   . Empyema ([place])   . GERD (gastroesophageal reflux disease)   . Hodgkin's disease ([place]) 1999  . Hypotension   . Meniere's disease   . Pneumonia   . Tinnitus    Family History  Problem Relation Age of Onset  . Hypertension Father   . Diabetes Father   . Testicular cancer Brother   . Melanoma Brother    Past Surgical History:  Procedure Laterality Date  . CERVICAL FUSION  2016  . ELBOW SURGERY    . LAMINECTOMY  1999  . lower back surgery     Social History   Occupational History  . Retired    Social History Main Topics  . Smoking status: Former Smoker    Packs/day: 1.50    Years: 47.00    Types: Cigarettes    Quit date: 03/03/2017  . Smokeless tobacco: Never Used  . Alcohol use Yes     Comment: "4-6 beers daily"  . Drug use: No  . Sexual activity: Not on file  OBJECTIVE:  VS:  HT:    WT:173 lb (78.5 kg)  BMI:     BP:124/60  HR:86bpm  TEMP: ( )  RESP:96 % EXAM: Findings:  Marked pain with straight leg raise bilaterally.  He has some weakness with hip flexion but this is minimal.  Otherwise his lower extremity strength is intact to manual muscle testing.  DP and PT pulses are palpable although faint.  He does have some trace pitting edema of bilateral extremities.  No significant venous stasis changes.  No skin ulcerations.  He is sitting still quite uncomfortably during the exam and also slightly antalgic gait.    [name] LIMITED JOINT SPACE STRUCTURES LOW LEFT(NO LINKED CHARGES) Gerda Diss, DO     05/29/2017  6:46  AM LIMITED MSK ULTRASOUND OF Left Hip Images were obtained and interpreted by myself, Teresa Coombs, DO   Images have been saved and stored to PACS system. Images obtained on: GE S7 Ultrasound machine  FINDINGS:   Images of the left greater trochanter and glute medius tendons  were obtained that showed increased interstitial fluid as well as  a small amount of swelling is consistent with mild bursitis.  He  does have what appears to be a horizontal partial thickness tear  within the glute medius tendon. IMPRESSION:  1. Left glute medius tendinopathy with partial tearing and small  amount of bursal swelling.  MR Lumbar Spine W Wo Contrast CLINICAL DATA:  Increasing low back pain with left hip and leg pain for 2 years  EXAM: MRI LUMBAR SPINE WITHOUT AND WITH CONTRAST  TECHNIQUE: Multiplanar and multiecho pulse sequences of the lumbar spine were obtained without and with intravenous contrast.  CONTRAST:  31mL MULTIHANCE GADOBENATE DIMEGLUMINE 529 MG/ML IV SOLN  COMPARISON:  None.  FINDINGS: Segmentation:  Standard.  Alignment: 2 mm retrolisthesis of L3 on L4. 2 mm retrolisthesis of L4 on L5.  Vertebrae:  No fracture, evidence of discitis, or bone lesion.  Conus medullaris: Extends to the T12 level and appears normal.  Paraspinal and other soft tissues: No paraspinal abnormality.  Disc levels:  Disc spaces: Degenerative disc disease with disc height loss at L5-S1. Disc desiccation at L3-4, L4-5.  T12-L1: No significant disc bulge. No evidence of neural foraminal stenosis. No central canal stenosis.  L1-L2: Mild broad-based disc bulge. No evidence of neural foraminal stenosis. No central canal stenosis.  L2-L3: Broad-based disc bulge flattening the ventral thecal sac. Mild bilateral facet arthropathy. Bilateral lateral recess narrowing and mild spinal stenosis. No foraminal stenosis.  L3-L4: Broad-based disc bulge flattening the ventral thecal sac. Mild  bilateral facet arthropathy. Bilateral lateral recess narrowing. No evidence of neural foraminal stenosis. No central canal stenosis.  L4-L5: Broad-based disc bulge. Mild bilateral facet arthropathy. Bilateral lateral recess narrowing. No evidence of neural foraminal stenosis. No central canal stenosis.  L5-S1: Mild broad-based disc bulge. Mild bilateral foraminal stenosis. No central canal stenosis.  IMPRESSION: 1. Diffuse lumbar spine spondylosis as described above.  Electronically Signed   By: Kathreen Devoid   On: 07/09/2017 13:18   ASSESSMENT & PLAN:     ICD-10-CM   1. Intermittent claudication ([place]) I73.9 Ambulatory referral to Neurosurgery  2. Lumbar spondylosis M47.816 Ambulatory referral to Neurosurgery  3. Left hip pain M25.552   4. Tendinopathy of left gluteus medius M67.952   5. Tobacco use Z72.0   6. Chronic obstructive pulmonary disease, unspecified COPD type ([place]) J44.9   7. Cigarette smoker F17.210    ================================================================= Intermittent claudication [facility]) Patient  is having fairly significant worsening of his claudication type symptoms and I suspect this is from a neurogenic etiology given the significant multilevel degenerative changes that he has.  I suspect the majority of his symptoms are coming from what appears to be an acute annular tear of the L4-L5 lateral recess which is seemingly the most significant of his symptoms however he has pain and weakness with L3 myotome testing as well but this is minimal.  We discussed the options for continued conservative measures with epidural steroid injection versus nerve conduction studies to ensure no significant neuropathy versus neurosurgical evaluation for consideration of surgical intervention.  Given the findings on his MRI and progressive nature of his symptoms further evaluation by neurosurgery was decided upon and will defer to their expertise for any further recommendations  from the standpoint of conservative versus operative intervention.  Continue gabapentin and consider titration up if he notices this is more beneficial for him.  Cigarette smoker Reports being quit as of May 2018 however this will need to be readdressed if operative intervention is considered.  Lumbar spondylosis Severe multilevel degenerative changes.  Tendinopathy of left gluteus medius He does have some objective findings of tendinopathy of the left hip abductor's however symptoms do seem to be worsening and do not correlate with this finding at this time.  Suspect tendinopathy secondary to neurogenic cause.   Follow-up: Return if symptoms worsen or fail to improve.  Referral to neurosurgery  CMA/ATC served as scribe during this visit. History, Physical, and Plan performed by medical provider. Documentation and orders reviewed and attested to.      Teresa Coombs, [facility] Physician

## 2017-07-27 NOTE — Assessment & Plan Note (Signed)
He does have some objective findings of tendinopathy of the left hip abductor's however symptoms do seem to be worsening and do not correlate with this finding at this time.  Suspect tendinopathy secondary to neurogenic cause.

## 2017-07-27 NOTE — Assessment & Plan Note (Signed)
Patient is having fairly significant worsening of his claudication type symptoms and I suspect this is from a neurogenic etiology given the significant multilevel degenerative changes that he has.  I suspect the majority of his symptoms are coming from what appears to be an acute annular tear of the L4-L5 lateral recess which is seemingly the most significant of his symptoms however he has pain and weakness with L3 myotome testing as well but this is minimal.  We discussed the options for continued conservative measures with epidural steroid injection versus nerve conduction studies to ensure no significant neuropathy versus neurosurgical evaluation for consideration of surgical intervention.  Given the findings on his MRI and progressive nature of his symptoms further evaluation by neurosurgery was decided upon and will defer to their expertise for any further recommendations from the standpoint of conservative versus operative intervention.  Continue gabapentin and consider titration up if he notices this is more beneficial for him.

## 2017-07-27 NOTE — Assessment & Plan Note (Signed)
Reports being quit as of May 2018 however this will need to be readdressed if operative intervention is considered.

## 2017-07-27 NOTE — Assessment & Plan Note (Signed)
Severe multilevel degenerative changes.

## 2017-07-30 ENCOUNTER — Encounter: Payer: Self-pay | Admitting: Internal Medicine

## 2017-07-30 ENCOUNTER — Ambulatory Visit (INDEPENDENT_AMBULATORY_CARE_PROVIDER_SITE_OTHER): Payer: Medicare Other | Admitting: Internal Medicine

## 2017-07-30 VITALS — BP 108/74 | HR 78 | Ht 71.0 in | Wt 173.0 lb

## 2017-07-30 DIAGNOSIS — J449 Chronic obstructive pulmonary disease, unspecified: Secondary | ICD-10-CM

## 2017-07-30 LAB — PULMONARY FUNCTION TEST
DL/VA % pred: 59 %
DL/VA: 2.77 ml/min/mmHg/L
DLCO UNC % PRED: 39 %
DLCO UNC: 13.19 ml/min/mmHg
DLCO cor % pred: 42 %
DLCO cor: 14.16 ml/min/mmHg
FEF 25-75 POST: 0.78 L/s
FEF 25-75 Pre: 0.45 L/sec
FEF2575-%Change-Post: 71 %
FEF2575-%PRED-POST: 26 %
FEF2575-%Pred-Pre: 15 %
FEV1-%CHANGE-POST: 23 %
FEV1-%PRED-POST: 38 %
FEV1-%Pred-Pre: 31 %
FEV1-POST: 1.39 L
FEV1-PRE: 1.13 L
FEV1FVC-%Change-Post: 4 %
FEV1FVC-%PRED-PRE: 61 %
FEV6-%Change-Post: 20 %
FEV6-%Pred-Post: 60 %
FEV6-%Pred-Pre: 50 %
FEV6-POST: 2.8 L
FEV6-PRE: 2.33 L
FEV6FVC-%Change-Post: 2 %
FEV6FVC-%PRED-POST: 101 %
FEV6FVC-%PRED-PRE: 99 %
FVC-%Change-Post: 18 %
FVC-%PRED-PRE: 50 %
FVC-%Pred-Post: 59 %
FVC-POST: 2.9 L
FVC-PRE: 2.45 L
PRE FEV6/FVC RATIO: 95 %
Post FEV1/FVC ratio: 48 %
Post FEV6/FVC ratio: 97 %
Pre FEV1/FVC ratio: 46 %

## 2017-07-30 MED ORDER — SYMBICORT 160-4.5 MCG/ACT IN AERO: 2.0000 | INHALATION_SPRAY | Freq: Every day | RESPIRATORY_TRACT | 0 refills | Status: DC

## 2017-07-30 NOTE — Addendum Note (Signed)
Addended by: Rosana Berger on: 07/30/2017 01:46 PM   Modules accepted: Orders

## 2017-07-30 NOTE — Patient Instructions (Addendum)
Work on maintaining perfect inhaler technique:  relax and gently blow all the way out then take a nice smooth deep breath back in, triggering the inhaler at same time you start breathing in.  Hold for up to 5 seconds if you can. Blow out thru nose. Rinse and gargle with water when done   If you are satisfied with your treatment plan,  let your doctor know and he/she can either refill your medications or you can return here when your prescription runs out.     If in any way you are not 100% satisfied,  please tell us.  If 100% better, tell your friends!  Pulmonary follow up is as needed      

## 2017-07-30 NOTE — Progress Notes (Signed)
Subjective:    Patient ID: Chase Lloyd, male    DOB: 1956/02/04,     MRN: [id number]      Brief patient profile:  68 yowm quit smoking 03/2017 first aware of doe 2013 dx as copd rx symbicort helped a lot then pna/ sepsis Feb 2017 and added spiriva and rx 02 short term and moved to [place] may 2018 and referred to pulmonary clinic 05/29/2017 by [name] with GOLD III criteria at initial eval    History of Present Illness  05/29/2017 1st [facility] office visit/ Fredie Majano  Maint rx symb 160 2bid / spiriva dpi  Chief Complaint  Patient presents with  . Pulmonary Consult    Pt referred by Dr. [name] for COPD. Pt c/o DOE, prod cough with yellow mucus. Pt denies CP/tightness and f/c/s. Pt states he rarely uses his albuterol hfa.   ok at rest, sleeps fine with improving cough since quit smoking  Left leg swelling x months assoc with hip pain > ortho following with 'neg sonogram"  per pt  rec Plan A = Automatic = Symbicort 160 Take 2 puffs first thing in am and then another 2 puffs about 12 hours later and spiriva 2 puffs in am only (ok to use up the powder form if you want)  Work on inhaler technique:  relax and gently blow all the way out then take a nice smooth deep breath back in, triggering the inhaler at same time you start breathing in.  Hold for up to 5 seconds if you can. Blow out thru nose. Rinse and gargle with water when done Plan B = Backup Only use your albuterol as a rescue medication   07/30/2017  f/u ov/Emeline Simpson re:  GOLD III with reversibility / symb 160/spiriva smi Chief Complaint  Patient presents with  . Follow-up    PFT's done today. His breathing is doing well. He has not had to use his rescue inhaler. No new co's today.   Not limited by breathing from desired activities but  Due to back and hip and leg issues  Sleeping fine/ no noct saba or am flares  No obvious day to day or daytime variability or assoc excess/ purulent sputum or mucus plugs or hemoptysis or cp or  chest tightness, subjective wheeze or overt sinus or hb symptoms. No unusual exp hx or h/o childhood pna/ asthma or knowledge of premature birth.  Sleeping ok flat without nocturnal  or early am exacerbation  of respiratory  c/o's or need for noct saba. Also denies any obvious fluctuation of symptoms with weather or environmental changes or other aggravating or alleviating factors except as outlined above   Current Allergies, Complete Past Medical History, Past Surgical History, Family History, and Social History were reviewed in [employer] record.  ROS  The following are not active complaints unless bolded sore throat, dysphagia, dental problems, itching, sneezing,  nasal congestion or disharge of excess mucus or purulent secretions, ear ache,   fever, chills, sweats, unintended wt loss or wt gain, classically pleuritic or exertional cp,  orthopnea pnd or leg swelling, presyncope, palpitations, abdominal pain, anorexia, nausea, vomiting, diarrhea  or change in bowel habits or bladder habits, change in stools or change in urine, dysuria, hematuria,  rash, arthralgias, visual complaints, headache, numbness, weakness or ataxia or problems with walking or coordination,  change in mood/affect or memory.        Current Meds  Medication Sig  . albuterol (PROVENTIL HFA;VENTOLIN HFA)  108 (90 Base) MCG/ACT inhaler Inhale 2 puffs into the lungs every 6 (six) hours as needed for wheezing or shortness of breath.  . fluticasone (FLONASE) 50 MCG/ACT nasal spray Place 2 sprays into both nostrils daily.  Marland Kitchen gabapentin (NEURONTIN) 300 MG capsule Take 300 mg by mouth 3 (three) times daily.  . Nicotine (NICODERM CQ TD) Place onto the skin.  Marland Kitchen omeprazole (PRILOSEC) 40 MG capsule Take 1 capsule (40 mg total) by mouth daily.  . SYMBICORT 160-4.5 MCG/ACT inhaler Inhale 2 puffs into the lungs daily.  . Tiotropium Bromide Monohydrate (SPIRIVA RESPIMAT) 2.5 MCG/ACT AERS Inhale 2 puffs into the lungs  daily.                Objective:   Physical Exam   amb wm nad   07/30/2017        173  05/29/17 168 lb (76.2 kg)  05/01/17 164 lb (74.4 kg)  04/15/17 162 lb (73.5 kg)    Vital signs reviewed  - Note on arrival 02 sats  95% on RA    HEENT: nl  turbinates bilaterally, and oropharynx. Nl external ear canals without cough reflex - full dentures  NECK :  without JVD/Nodes/TM/ nl carotid upstrokes bilaterally   LUNGS: no acc muscle use,  slt barrel contour with distant bs bilaterally, hyperresonant to percussion with minimal distant late exp wheeze resolves with plm    CV:  RRR  no s3 or murmur or increase in P2, and very min edema L >R  Ankle   ABD:  soft and nontender with  Pos hoover's late in inspiration in the supine position. No bruits or organomegaly appreciated, bowel sounds nl  MS:  Nl gait/ ext warm without deformities, calf tenderness, cyanosis or clubbing No obvious joint restrictions   SKIN: warm and dry without lesions    NEURO:  alert, approp, nl sensorium with  no motor or cerebellar deficits apparent.      I personally reviewed images and agree with radiology impression as follows:  CXR:   04/15/17  The lungs are hyperinflated likely secondary to COPD. There is chronic right apical scarring. There is mild right middle lobe scarring. There is no focal consolidation. There is no pleural effusion or pneumothorax. The heart and mediastinal contours are unremarkable.       Assessment & Plan:

## 2017-07-30 NOTE — Progress Notes (Signed)
PFT done today. 

## 2017-07-30 NOTE — Assessment & Plan Note (Signed)
Quit smoking 03/2017 -  Spirometry 05/29/2017  FEV1 1.37 (39%)  Ratio 47 p am symb x2 and spirva x one and abn effort dep portion  - 05/29/2017   > rec  change  spiriva to  respimat and symb 160 2bid s spacer   - PFT's  07/30/2017  FEV1 1.39 (38 % ) ratio 48  p 23 % improvement from saba p nothing prior to study with DLCO  39/42 % corrects to 59 % for alv volume    Presently more limited by back/ hip/ legs and relatively well compensated on laba/lama/ics despite severe copd with reversible component on pfts - eventually over time may if able to stay off cigs) become  Group B in terms of symptom/risk and laba/lama might be a better fit but for now no change rx    I reviewed the Fletcher curve with the patient that basically indicates  if you quit smoking when your best day FEV1 is still somewhat preserved (as is only marginally  case here)  it is highly unlikely you will progress to severe disease and informed the patient there was  no medication on the market that has proven to alter the curve/ its downward trajectory  or the likelihood of progression of their disease(unlike other chronic medical conditions such as atheroclerosis where we do think we can change the natural hx with risk reducing meds)    Therefore   maintaining abstinence is the most important aspect of care, not choice of inhalers or for that matter, doctors.    rx if for symptoms or flares neither of which apply here so f/u can be prn   I had an extended discussion with the patient reviewing all relevant studies completed to date and  lasting 15 to 20 minutes of a 25 minute visit    Each maintenance medication was reviewed in detail including most importantly the difference between maintenance and prns and under what circumstances the prns are to be triggered using an action plan format that is not reflected in the computer generated alphabetically organized AVS.    Please see AVS for specific instructions unique to this visit that I  personally wrote and verbalized to the the pt in detail and then reviewed with pt  by my nurse highlighting any  changes in therapy recommended at today's visit to their plan of care.

## 2017-08-09 ENCOUNTER — Encounter (HOSPITAL_COMMUNITY): Payer: Self-pay | Admitting: *Deleted

## 2017-08-09 ENCOUNTER — Ambulatory Visit (HOSPITAL_COMMUNITY)
Admission: EM | Admit: 2017-08-09 | Discharge: 2017-08-09 | Disposition: A | Discharge status: Home / Self Care | Payer: Medicare Other | Attending: Internal Medicine | Admitting: Internal Medicine

## 2017-08-09 ENCOUNTER — Ambulatory Visit (INDEPENDENT_AMBULATORY_CARE_PROVIDER_SITE_OTHER): Payer: Medicare Other

## 2017-08-09 DIAGNOSIS — M25521 Pain in right elbow: Secondary | ICD-10-CM

## 2017-08-09 DIAGNOSIS — S59901A Unspecified injury of right elbow, initial encounter: Secondary | ICD-10-CM | POA: Diagnosis not present

## 2017-08-09 HISTORY — DX: Dorsalgia, unspecified: M54.9

## 2017-08-09 NOTE — Discharge Instructions (Signed)
Xray negative for fracture/dislocation. Continue ibuprofen. Ice compress and elevation as needed. This can take up to 3-4 weeks to completely resolve, but you should be feeling better each week. Follow up with PCP if symptoms does no resolve or worsens.

## 2017-08-09 NOTE — ED Triage Notes (Signed)
Reports falling approx 5 ft off ladder 9 days ago; denies hitting head or LOC, states landed on concrete on RUE.  C/O continued right forearm pain; also tender to palpation in right elbow.  Denies shoulder, wrist or hand pain.  CMS instact RUE.

## 2017-08-09 NOTE — ED Provider Notes (Signed)
[facility]    CSN: [id number] Arrival date & time: 08/09/17  1726     History   Chief Complaint Chief Complaint  Patient presents with  . Fall  . Arm Injury    HPI Chase Lloyd is a 61 y.o. male.   61 year old male with history of lumbar degenerative disc disease, COPD, Hodgkin's disease, comes in for continued right elbow and forearm pain after falling off a ladder 9 days ago. [place] about 5 ft in length onto the floor and landed on right upper extremity. Denies head injury, loss of consciousness, nausea, vomiting. Baseline wheezing due to COPD, no changes. Patient has been taking ibuprofen 800mg  TID for back pain, which has helped with symptoms. Patient states most pain when putting arm down on a surface. Denies numbness/tingling.       Past Medical History:  Diagnosis Date  . Back pain   . COPD (chronic obstructive pulmonary disease) ([place])   . DDD (degenerative disc disease), lumbar   . Depression   . Emphysema of lung ([place])   . Empyema ([place])   . GERD (gastroesophageal reflux disease)   . Hodgkin's disease ([place]) 1999  . Hypotension   . Meniere's disease   . Pneumonia   . Tinnitus     Patient Active Problem List   Diagnosis Date Noted  . Intermittent claudication ([place]) 06/12/2017  . Lumbar spondylosis 06/12/2017  . COPD GOLD III 05/29/2017  . Tendinopathy of left gluteus medius 05/01/2017  . Cigarette smoker 05/01/2017    Past Surgical History:  Procedure Laterality Date  . BACK SURGERY    . CERVICAL FUSION  2016  . ELBOW SURGERY    . LAMINECTOMY  1999  . lower back surgery         Home Medications    Prior to Admission medications   Medication Sig Start Date End Date Taking? Authorizing Provider  albuterol (PROVENTIL HFA;VENTOLIN HFA) 108 (90 Base) MCG/ACT inhaler Inhale 2 puffs into the lungs every 6 (six) hours as needed for wheezing or shortness of breath. 05/29/17  Yes Tanda Rockers, MD  gabapentin (NEURONTIN) 300 MG capsule Take  300 mg by mouth 3 (three) times daily.   Yes [provider]  ibuprofen (ADVIL,MOTRIN) 800 MG tablet Take 800 mg by mouth 3 (three) times daily.   Yes [provider]  Nicotine (NICODERM CQ TD) Place onto the skin.   Yes [provider]  omeprazole (PRILOSEC) 40 MG capsule Take 1 capsule (40 mg total) by mouth daily. 04/15/17  Yes Nafziger, Tommi Rumps, NP  SYMBICORT 160-4.5 MCG/ACT inhaler Inhale 2 puffs into the lungs daily. 07/30/17  Yes Tanda Rockers, MD  Tiotropium Bromide Monohydrate (SPIRIVA RESPIMAT) 2.5 MCG/ACT AERS Inhale 2 puffs into the lungs daily. 06/19/17  Yes Tanda Rockers, MD  fluticasone (FLONASE) 50 MCG/ACT nasal spray Place 2 sprays into both nostrils daily. 04/15/17   [name], NP    Family History Family History  Problem Relation Age of Onset  . Hypertension Father   . Diabetes Father   . Testicular cancer Brother   . Melanoma Brother     Social History Social History  Substance Use Topics  . Smoking status: Former Smoker    Packs/day: 1.50    Years: 47.00    Types: Cigarettes    Quit date: 03/03/2017  . Smokeless tobacco: Never Used  . Alcohol use Yes     Comment: "4-6 beers daily"     Allergies  Patient has no known allergies.   Review of Systems Review of Systems  Reason unable to perform ROS: See HPI as above.     Physical Exam Triage Vital Signs ED Triage Vitals [08/09/17 1809]  Enc Vitals Group     BP (!) 169/63     Pulse Rate 84     Resp 16     Temp 98.3 F (36.8 C)     Temp Source Oral     SpO2 99 %     Weight      Height      Head Circumference      Peak Flow      Pain Score      Pain Loc      Pain Edu?      Excl. in [place]?    No data found.   Updated Vital Signs BP (!) 169/63   Pulse 84   Temp 98.3 F (36.8 C) (Oral)   Resp 16   SpO2 99%   Physical Exam  Constitutional: He is oriented to person, place, and time. He appears well-developed and well-nourished. No distress.  HENT:  Head:  Normocephalic and atraumatic.  Eyes: Pupils are equal, round, and reactive to light. Conjunctivae are normal.  Musculoskeletal:  No obvious swelling noted. Healing abrasions around ulnar aspect of forearm. Tenderness to palpation of ulnar aspect of forearm adjacent to the elbow. Full ROM of elbow, wrist, fingers. Strength normal and equal bilaterally. Sensation intact and equal bilaterally. Radial pulses 2+ and equal bilaterally. Cap refill <2s  Neurological: He is alert and oriented to person, place, and time.     UC Treatments / Results  Labs (all labs ordered are listed, but only abnormal results are displayed) Labs Reviewed - No data to display  EKG  EKG Interpretation None       Radiology Dg Elbow Complete Right  Result Date: 08/09/2017 CLINICAL DATA:  Recent fall from ladder.  Right elbow pain. EXAM: RIGHT ELBOW - COMPLETE 3+ VIEW COMPARISON:  None. FINDINGS: Suboptimal lateral view due to elbow positioning in extension, which limits assessment for a joint effusion. No dislocation. Small enthesophytes are noted at the triceps insertion on the right olecranon with associated mild posterior olecranon cortical irregularity and mild soft tissue swelling in this location. Otherwise no fracture. No suspicious focal osseous lesion. No significant arthropathy. No radiopaque foreign body. IMPRESSION: Small enthesophytes at the triceps insertion on the right olecranon with associated mild posterior olecranon cortical irregularity and mild soft tissue swelling in this location, cannot exclude an avulsion injury at the triceps insertion on the olecranon. Otherwise no right elbow fracture or malalignment. Electronically Signed   By: Ilona Sorrel M.D.   On: 08/09/2017 18:55    Procedures Procedures (including critical care time)  Medications Ordered in UC Medications - No data to display   Initial Impression / Assessment and Plan / UC Course  I have reviewed the triage vital signs and the  nursing notes.  Pertinent labs & imaging results that were available during my care of the patient were reviewed by me and considered in my medical decision making (see chart for details).    Continue ibuprofen. Ice compress and elevation as needed. Follow up with PCP if symptoms worsens, or do not resolve.   Final Clinical Impressions(s) / UC Diagnoses   Final diagnoses:  Right elbow pain    New Prescriptions New Prescriptions   No medications on file      Dayrin Stallone V,  PA-C 08/09/17 1923

## 2017-08-10 DIAGNOSIS — R030 Elevated blood-pressure reading, without diagnosis of hypertension: Secondary | ICD-10-CM | POA: Diagnosis not present

## 2017-08-10 DIAGNOSIS — M48062 Spinal stenosis, lumbar region with neurogenic claudication: Secondary | ICD-10-CM | POA: Diagnosis not present

## 2017-08-10 DIAGNOSIS — M549 Dorsalgia, unspecified: Secondary | ICD-10-CM | POA: Diagnosis not present

## 2017-08-18 ENCOUNTER — Telehealth: Payer: Self-pay | Admitting: Adult Health

## 2017-08-18 NOTE — Telephone Encounter (Signed)
Pt would like to see if Tommi Rumps could look at his xrays that are in the chart to see if he sees any kind of fracture.  Pt had a 5 foot fall from a ladder on 9/26 and his arm is still in a lot of pain.  Pt refused an appointment.

## 2017-08-19 NOTE — Telephone Encounter (Signed)
Left a message for a return call.  If pt is still experiencing pain then he will need to come in for evaluation per [facility].

## 2017-08-20 NOTE — Telephone Encounter (Signed)
Spoke to the pt.  Explained that since he continues to experience pain than he should come in and be seen.  Pt agreed and is scheduled for 08/21/17 @ 8:30 AM.

## 2017-08-21 ENCOUNTER — Ambulatory Visit (INDEPENDENT_AMBULATORY_CARE_PROVIDER_SITE_OTHER): Payer: Medicare Other | Admitting: Adult Health

## 2017-08-21 ENCOUNTER — Encounter: Payer: Self-pay | Admitting: Adult Health

## 2017-08-21 ENCOUNTER — Telehealth: Payer: Self-pay | Admitting: Adult Health

## 2017-08-21 ENCOUNTER — Ambulatory Visit (INDEPENDENT_AMBULATORY_CARE_PROVIDER_SITE_OTHER)
Admission: RE | Admit: 2017-08-21 | Discharge: 2017-08-21 | Disposition: A | Discharge status: Home / Self Care | Payer: Medicare Other | Source: Ambulatory Visit | Attending: Adult Health | Admitting: Adult Health

## 2017-08-21 VITALS — BP 136/54 | Temp 98.0°F | Wt 176.0 lb

## 2017-08-21 DIAGNOSIS — M79631 Pain in right forearm: Secondary | ICD-10-CM

## 2017-08-21 DIAGNOSIS — S59911A Unspecified injury of right forearm, initial encounter: Secondary | ICD-10-CM | POA: Diagnosis not present

## 2017-08-21 MED ORDER — NAPROXEN 500 MG PO TABS: 500.0000 mg | ORAL_TABLET | Freq: Two times a day (BID) | ORAL | 0 refills | Status: DC

## 2017-08-21 NOTE — Progress Notes (Signed)
Subjective:    Patient ID: Chase Lloyd, male    DOB: 03-03-1956, 61 y.o.   MRN: [id number]  HPI  61 year old male who  has a past medical history of Back pain; COPD (chronic obstructive pulmonary disease) ([place]); DDD (degenerative disc disease), lumbar; Depression; Emphysema of lung ([place]); Empyema ([place]); GERD (gastroesophageal reflux disease); Hodgkin's disease ([place]) (1999); Hypotension; Meniere's disease; Pneumonia; and Tinnitus.   He presents to the office today s/p fall from about 5 foot off ladder onto the floor and landed on his right upper extremity. He denies any head injury, LOC. He was seen at urgent care on 08/09/2017 ( about one week after incident). X ray showed:  IMPRESSION: Small enthesophytes at the triceps insertion on the right olecranon with associated mild posterior olecranon cortical irregularity and mild soft tissue swelling in this location, cannot exclude an avulsion injury at the triceps insertion on the olecranon.  Otherwise no right elbow fracture or malalignment.  He has been taking Motrin without relief of pain. Pain is worse when he places arm on flat surface or touches the area. Pain is described as " a bee sting" and he does report radiating pain down his arm to his right hand.    Review of Systems See HPI   Past Medical History:  Diagnosis Date  . Back pain   . COPD (chronic obstructive pulmonary disease) ([place])   . DDD (degenerative disc disease), lumbar   . Depression   . Emphysema of lung ([place])   . Empyema ([place])   . GERD (gastroesophageal reflux disease)   . Hodgkin's disease ([place]) 1999  . Hypotension   . Meniere's disease   . Pneumonia   . Tinnitus     Social History   Social History  . Marital status: Married    Spouse name: N/A  . Number of children: N/A  . Years of education: N/A   Occupational History  . Retired    Social History Main Topics  . Smoking status: Former Smoker    Packs/day: 1.50    Years: 47.00    Types:  Cigarettes    Quit date: 03/03/2017  . Smokeless tobacco: Never Used  . Alcohol use Yes     Comment: "4-6 beers daily"  . Drug use: No  . Sexual activity: Not on file   Other Topics Concern  . Not on file   Social History Narrative   Retied - worked as Furniture conservator/restorer           Past Surgical History:  Procedure Laterality Date  . BACK SURGERY    . CERVICAL FUSION  2016  . ELBOW SURGERY    . LAMINECTOMY  1999  . lower back surgery      Family History  Problem Relation Age of Onset  . Hypertension Father   . Diabetes Father   . Testicular cancer Brother   . Melanoma Brother     No Known Allergies  Current Outpatient Prescriptions on File Prior to Visit  Medication Sig Dispense Refill  . fluticasone (FLONASE) 50 MCG/ACT nasal spray Place 2 sprays into both nostrils daily. 16 g 6  . gabapentin (NEURONTIN) 300 MG capsule Take 300 mg by mouth 3 (three) times daily.    Marland Kitchen ibuprofen (ADVIL,MOTRIN) 800 MG tablet Take 800 mg by mouth 3 (three) times daily.    Marland Kitchen omeprazole (PRILOSEC) 40 MG capsule Take 1 capsule (40 mg total) by mouth daily. 90 capsule 3  . SYMBICORT 160-4.5 MCG/ACT  inhaler Inhale 2 puffs into the lungs daily. 1 Inhaler 0  . Tiotropium Bromide Monohydrate (SPIRIVA RESPIMAT) 2.5 MCG/ACT AERS Inhale 2 puffs into the lungs daily. 1 Inhaler 11  . albuterol (PROVENTIL HFA;VENTOLIN HFA) 108 (90 Base) MCG/ACT inhaler Inhale 2 puffs into the lungs every 6 (six) hours as needed for wheezing or shortness of breath. (Patient not taking: Reported on 08/21/2017)     No current facility-administered medications on file prior to visit.     BP (!) 136/54 (BP Location: Left Arm)   Temp 98 F (36.7 C) (Oral)   Wt 176 lb (79.8 kg)   BMI 24.55 kg/m       Objective:   Physical Exam  Constitutional: He is oriented to person, place, and time. He appears well-developed and well-nourished. No distress.  Musculoskeletal: He exhibits tenderness and deformity.  Small deformity  (indentation) noted along ulna side of right forearm. Pain with palpation   Neurological: He is alert and oriented to person, place, and time.  Skin: Skin is warm and dry. No rash noted. He is not diaphoretic. No erythema. No pallor.  Psychiatric: He has a normal mood and affect. His behavior is normal. Judgment and thought content normal.  Nursing note and vitals reviewed.     Assessment & Plan:  1. Right forearm pain - Will get repeat x ray to look for fracture due to indentation.  - Consider referral to Orthopedics.  - DG Forearm Right; Future - naproxen (NAPROSYN) 500 MG tablet; Take 1 tablet (500 mg total) by mouth 2 (two) times daily with a meal.  Dispense: 30 tablet; Refill: 0   [name], NP

## 2017-08-21 NOTE — Telephone Encounter (Signed)
Reviewed x ray result with patient.  

## 2017-08-25 DIAGNOSIS — M48062 Spinal stenosis, lumbar region with neurogenic claudication: Secondary | ICD-10-CM | POA: Diagnosis not present

## 2017-11-06 ENCOUNTER — Ambulatory Visit (INDEPENDENT_AMBULATORY_CARE_PROVIDER_SITE_OTHER): Payer: Medicare Other | Admitting: Adult Health

## 2017-11-06 ENCOUNTER — Encounter: Payer: Self-pay | Admitting: Adult Health

## 2017-11-06 DIAGNOSIS — Z720 Tobacco use: Secondary | ICD-10-CM

## 2017-11-06 DIAGNOSIS — F17210 Nicotine dependence, cigarettes, uncomplicated: Secondary | ICD-10-CM

## 2017-11-06 MED ORDER — VARENICLINE TARTRATE 0.5 MG X 11 & 1 MG X 42 PO MISC: ORAL | 0 refills | Status: DC

## 2017-11-06 NOTE — Progress Notes (Signed)
Subjective:    Patient ID: Chase Lloyd, male    DOB: 1956-10-06, 62 y.o.   MRN: [id number]  HPI  61 year old male who  has a past medical history of Back pain, COPD (chronic obstructive pulmonary disease) ([place]), DDD (degenerative disc disease), lumbar, Depression, Emphysema of lung ([place]), Empyema ([place]), GERD (gastroesophageal reflux disease), Hodgkin's disease ([place]) (1999), Hypotension, Meniere's disease, Pneumonia, and Tinnitus.  He presents to the office to discuss smoking cessation. He has quit in the past using Chantix, he did well with this medication and would like to try this again. He is currently smoking about a half a pack a day. He denies any fevers, chills, SOB, or weight loss. He has a quit date in mind.     Review of Systems See HPI   Past Medical History:  Diagnosis Date  . Back pain   . COPD (chronic obstructive pulmonary disease) ([place])   . DDD (degenerative disc disease), lumbar   . Depression   . Emphysema of lung ([place])   . Empyema ([place])   . GERD (gastroesophageal reflux disease)   . Hodgkin's disease ([place]) 1999  . Hypotension   . Meniere's disease   . Pneumonia   . Tinnitus     Social History   Socioeconomic History  . Marital status: Married    Spouse name: Not on file  . Number of children: Not on file  . Years of education: Not on file  . Highest education level: Not on file  Social Needs  . Financial resource strain: Not on file  . Food insecurity - worry: Not on file  . Food insecurity - inability: Not on file  . Transportation needs - medical: Not on file  . Transportation needs - non-medical: Not on file  Occupational History  . Occupation: Retired  Tobacco Use  . Smoking status: Former Smoker    Packs/day: 1.50    Years: 47.00    Pack years: 70.50    Types: Cigarettes    Last attempt to quit: 03/03/2017    Years since quitting: 0.6  . Smokeless tobacco: Never Used  Substance and Sexual Activity  . Alcohol use: Yes    Comment:  "4-6 beers daily"  . Drug use: No  . Sexual activity: Not on file  Other Topics Concern  . Not on file  Social History Narrative   Retied - worked as Furniture conservator/restorer        Past Surgical History:  Procedure Laterality Date  . BACK SURGERY    . CERVICAL FUSION  2016  . ELBOW SURGERY    . LAMINECTOMY  1999  . lower back surgery      Family History  Problem Relation Age of Onset  . Hypertension Father   . Diabetes Father   . Testicular cancer Brother   . Melanoma Brother     No Known Allergies  Current Outpatient Medications on File Prior to Visit  Medication Sig Dispense Refill  . albuterol (PROVENTIL HFA;VENTOLIN HFA) 108 (90 Base) MCG/ACT inhaler Inhale 2 puffs into the lungs every 6 (six) hours as needed for wheezing or shortness of breath. (Patient not taking: Reported on 08/21/2017)    . fluticasone (FLONASE) 50 MCG/ACT nasal spray Place 2 sprays into both nostrils daily. 16 g 6  . gabapentin (NEURONTIN) 300 MG capsule Take 300 mg by mouth 3 (three) times daily.    Marland Kitchen ibuprofen (ADVIL,MOTRIN) 800 MG tablet Take 800 mg by mouth 3 (three) times daily.    Marland Kitchen  naproxen (NAPROSYN) 500 MG tablet Take 1 tablet (500 mg total) by mouth 2 (two) times daily with a meal. 30 tablet 0  . omeprazole (PRILOSEC) 40 MG capsule Take 1 capsule (40 mg total) by mouth daily. 90 capsule 3  . SYMBICORT 160-4.5 MCG/ACT inhaler Inhale 2 puffs into the lungs daily. 1 Inhaler 0  . Tiotropium Bromide Monohydrate (SPIRIVA RESPIMAT) 2.5 MCG/ACT AERS Inhale 2 puffs into the lungs daily. 1 Inhaler 11   No current facility-administered medications on file prior to visit.     There were no vitals taken for this visit.      Objective:   Physical Exam  Constitutional: He is oriented to person, place, and time. He appears well-developed and well-nourished. No distress.  Cardiovascular: Normal rate, regular rhythm, normal heart sounds and intact distal pulses. Exam reveals no gallop and no friction rub.  No  murmur heard. Pulmonary/Chest: Effort normal. No respiratory distress. He has wheezes. He has no rales. He exhibits no tenderness.  Neurological: He is alert and oriented to person, place, and time.  Skin: Skin is warm and dry. No rash noted. He is not diaphoretic. No erythema. No pallor.  Psychiatric: He has a normal mood and affect. His behavior is normal. Judgment and thought content normal.  Nursing note and vitals reviewed.     Assessment & Plan:  1. Tobacco use Trial of chantix. Common side effects including rare risk of suicide ideation was discussed with the patient today.  Patient is instructed to go directly to the ED if this occurs.  We discussed that patient can continue to smoke for 2 week after starting chantix, but then must discontinue cigarettes.  He is also instructed follow up in one month  5 minutes spent with patient today on tobacco cessation counseling.   - varenicline (CHANTIX STARTING MONTH PAK) 0.5 MG X 11 & 1 MG X 42 tablet; Take one 0.5 mg tablet by mouth once daily for 3 days, then increase to one 0.5 mg tablet twice daily for 4 days, then increase to one 1 mg tablet twice daily.  Dispense: 53 tablet; Refill: 0  [name], NP

## 2017-12-08 ENCOUNTER — Ambulatory Visit (INDEPENDENT_AMBULATORY_CARE_PROVIDER_SITE_OTHER): Payer: Medicare Other | Admitting: Adult Health

## 2017-12-08 ENCOUNTER — Encounter: Payer: Self-pay | Admitting: Adult Health

## 2017-12-08 VITALS — BP 126/50 | Temp 98.7°F | Wt 175.0 lb

## 2017-12-08 DIAGNOSIS — Z716 Tobacco abuse counseling: Secondary | ICD-10-CM

## 2017-12-08 MED ORDER — VARENICLINE TARTRATE 1 MG PO TABS
1.0000 mg | ORAL_TABLET | Freq: Two times a day (BID) | ORAL | 2 refills | Status: DC
Start: 2017-12-08 — End: 2018-03-12

## 2017-12-08 NOTE — Progress Notes (Signed)
Subjective:    Patient ID: Chase Lloyd, male    DOB: [birth year], 62 y.o.   MRN: [id number]  HPI  62 year old male who  has a past medical history of Back pain, COPD (chronic obstructive pulmonary disease) ([place]), DDD (degenerative disc disease), lumbar, Depression, Emphysema of lung ([place]), Empyema ([place]), GERD (gastroesophageal reflux disease), Hodgkin's disease ([place]) (1999), Hypotension, Meniere's disease, Pneumonia, and Tinnitus.  He presents to the office today for one month follow up after starting chantix to help him quit smoking.   Today in the office he reports that he has cut back from 1/2 pack a day to smoking a few puff of a cigarette every other day or so. He reports that smoking now tastes awful and his cravings have been less.   He does report side effects of nausea and vivid dreams. These side effects are not enough to keep him from continuing chantix     Review of Systems See HPI   Past Medical History:  Diagnosis Date  . Back pain   . COPD (chronic obstructive pulmonary disease) ([place])   . DDD (degenerative disc disease), lumbar   . Depression   . Emphysema of lung ([place])   . Empyema ([place])   . GERD (gastroesophageal reflux disease)   . Hodgkin's disease ([place]) 1999  . Hypotension   . Meniere's disease   . Pneumonia   . Tinnitus     Social History   Socioeconomic History  . Marital status: Married    Spouse name: Not on file  . Number of children: Not on file  . Years of education: Not on file  . Highest education level: Not on file  Social Needs  . Financial resource strain: Not on file  . Food insecurity - worry: Not on file  . Food insecurity - inability: Not on file  . Transportation needs - medical: Not on file  . Transportation needs - non-medical: Not on file  Occupational History  . Occupation: Retired  Tobacco Use  . Smoking status: Former Smoker    Packs/day: 1.50    Years: 47.00    Pack years: 70.50    Types: Cigarettes    Last  attempt to quit: 03/03/2017    Years since quitting: 0.7  . Smokeless tobacco: Never Used  Substance and Sexual Activity  . Alcohol use: Yes    Comment: "4-6 beers daily"  . Drug use: No  . Sexual activity: Not on file  Other Topics Concern  . Not on file  Social History Narrative   Retied - worked as Furniture conservator/restorer        Past Surgical History:  Procedure Laterality Date  . BACK SURGERY    . CERVICAL FUSION  2016  . ELBOW SURGERY    . LAMINECTOMY  1999  . lower back surgery      Family History  Problem Relation Age of Onset  . Hypertension Father   . Diabetes Father   . Testicular cancer Brother   . Melanoma Brother     No Known Allergies  Current Outpatient Medications on File Prior to Visit  Medication Sig Dispense Refill  . albuterol (PROVENTIL HFA;VENTOLIN HFA) 108 (90 Base) MCG/ACT inhaler Inhale 2 puffs into the lungs every 6 (six) hours as needed for wheezing or shortness of breath.    . fluticasone (FLONASE) 50 MCG/ACT nasal spray Place 2 sprays into both nostrils daily. 16 g 6  . gabapentin (NEURONTIN) 300 MG capsule Take 300 mg  by mouth 3 (three) times daily.    Marland Kitchen ibuprofen (ADVIL,MOTRIN) 800 MG tablet Take 800 mg by mouth 3 (three) times daily.    . naproxen (NAPROSYN) 500 MG tablet Take 1 tablet (500 mg total) by mouth 2 (two) times daily with a meal. 30 tablet 0  . omeprazole (PRILOSEC) 40 MG capsule Take 1 capsule (40 mg total) by mouth daily. 90 capsule 3  . SYMBICORT 160-4.5 MCG/ACT inhaler Inhale 2 puffs into the lungs daily. 1 Inhaler 0  . Tiotropium Bromide Monohydrate (SPIRIVA RESPIMAT) 2.5 MCG/ACT AERS Inhale 2 puffs into the lungs daily. 1 Inhaler 11   No current facility-administered medications on file prior to visit.     BP (!) 126/50 (BP Location: Left Arm)   Temp 98.7 F (37.1 C) (Oral)   Wt 175 lb (79.4 kg)   BMI 24.41 kg/m       Objective:   Physical Exam  Constitutional: He is oriented to person, place, and time. He appears  well-developed and well-nourished. No distress.  Cardiovascular: Normal rate, regular rhythm, normal heart sounds and intact distal pulses. Exam reveals no gallop and no friction rub.  No murmur heard. Pulmonary/Chest: Effort normal and breath sounds normal. No respiratory distress. He has no wheezes. He has no rales. He exhibits no tenderness.  Neurological: He is alert and oriented to person, place, and time.  Skin: Skin is warm and dry. No rash noted. He is not diaphoretic. No erythema. No pallor.  Psychiatric: He has a normal mood and affect. His behavior is normal. Judgment and thought content normal.  Nursing note and vitals reviewed.     Assessment & Plan:  1. Encounter for tobacco use cessation counseling - varenicline (CHANTIX CONTINUING MONTH PAK) 1 MG tablet; Take 1 tablet (1 mg total) by mouth 2 (two) times daily.  Dispense: 60 tablet; Refill: 2 - Follow up as needed  [name], NP

## 2018-02-02 ENCOUNTER — Telehealth: Payer: Self-pay

## 2018-02-02 DIAGNOSIS — H9193 Unspecified hearing loss, bilateral: Secondary | ICD-10-CM

## 2018-02-02 NOTE — Telephone Encounter (Signed)
Ok to send to Audiology

## 2018-02-02 NOTE — Telephone Encounter (Signed)
Audiology referral placed in [place].

## 2018-02-02 NOTE — Telephone Encounter (Signed)
Copied from [place] [id number]. Topic: Referral - Request >> Feb 02, 2018  2:08 PM Vernona Rieger wrote: Reason for CRM: Pt's wife stated that he went to [facility] for a hearing test on 3/29 and he did not pass. They told him that they could not help him & that he needed to contact his PCP for a referral to a ENT. Please advise. Call back at [phone]

## 2018-02-23 DIAGNOSIS — H903 Sensorineural hearing loss, bilateral: Secondary | ICD-10-CM | POA: Diagnosis not present

## 2018-02-23 DIAGNOSIS — H9311 Tinnitus, right ear: Secondary | ICD-10-CM | POA: Diagnosis not present

## 2018-03-12 ENCOUNTER — Other Ambulatory Visit: Payer: Self-pay | Admitting: Family Medicine

## 2018-03-12 DIAGNOSIS — Z716 Tobacco abuse counseling: Secondary | ICD-10-CM

## 2018-03-12 MED ORDER — VARENICLINE TARTRATE 1 MG PO TABS: 1.0000 mg | ORAL_TABLET | Freq: Two times a day (BID) | ORAL | 1 refills | Status: DC

## 2018-04-12 ENCOUNTER — Other Ambulatory Visit: Payer: Self-pay | Admitting: Adult Health

## 2018-04-13 ENCOUNTER — Encounter: Payer: Self-pay | Admitting: Family Medicine

## 2018-04-13 NOTE — Telephone Encounter (Signed)
Needs CPE before medication can be refilled

## 2018-04-13 NOTE — Telephone Encounter (Signed)
Denied.  Message sent to the pharmacy letting them know that they need an appt for further refills.  I mailed letter to the pt.

## 2018-04-13 NOTE — Telephone Encounter (Signed)
Pt has not followed up for CPX.  Please advise.

## 2018-04-26 ENCOUNTER — Ambulatory Visit (INDEPENDENT_AMBULATORY_CARE_PROVIDER_SITE_OTHER): Payer: Medicare Other | Admitting: Adult Health

## 2018-04-26 ENCOUNTER — Encounter: Payer: Self-pay | Admitting: Adult Health

## 2018-04-26 ENCOUNTER — Ambulatory Visit (INDEPENDENT_AMBULATORY_CARE_PROVIDER_SITE_OTHER): Payer: Medicare Other

## 2018-04-26 ENCOUNTER — Telehealth: Payer: Self-pay | Admitting: Adult Health

## 2018-04-26 VITALS — BP 138/70 | Temp 98.5°F | Ht 69.0 in | Wt 172.0 lb

## 2018-04-26 DIAGNOSIS — Z114 Encounter for screening for human immunodeficiency virus [HIV]: Secondary | ICD-10-CM

## 2018-04-26 DIAGNOSIS — Z1159 Encounter for screening for other viral diseases: Secondary | ICD-10-CM

## 2018-04-26 DIAGNOSIS — M25552 Pain in left hip: Secondary | ICD-10-CM

## 2018-04-26 DIAGNOSIS — F17210 Nicotine dependence, cigarettes, uncomplicated: Secondary | ICD-10-CM | POA: Diagnosis not present

## 2018-04-26 DIAGNOSIS — Z125 Encounter for screening for malignant neoplasm of prostate: Secondary | ICD-10-CM

## 2018-04-26 DIAGNOSIS — J449 Chronic obstructive pulmonary disease, unspecified: Secondary | ICD-10-CM | POA: Diagnosis not present

## 2018-04-26 DIAGNOSIS — E785 Hyperlipidemia, unspecified: Secondary | ICD-10-CM

## 2018-04-26 DIAGNOSIS — K219 Gastro-esophageal reflux disease without esophagitis: Secondary | ICD-10-CM | POA: Diagnosis not present

## 2018-04-26 MED ORDER — SYMBICORT 160-4.5 MCG/ACT IN AERO
2.0000 | INHALATION_SPRAY | Freq: Every day | RESPIRATORY_TRACT | 0 refills | Status: DC
Start: 2018-04-26 — End: 2018-04-28

## 2018-04-26 MED ORDER — OMEPRAZOLE 40 MG PO CPDR: 40.0000 mg | DELAYED_RELEASE_CAPSULE | Freq: Every day | ORAL | 3 refills | Status: DC

## 2018-04-26 MED ORDER — ALBUTEROL SULFATE HFA 108 (90 BASE) MCG/ACT IN AERS: 2.0000 | INHALATION_SPRAY | Freq: Four times a day (QID) | RESPIRATORY_TRACT | 1 refills | Status: DC | PRN

## 2018-04-26 MED ORDER — BUPROPION HCL ER (SR) 150 MG PO TB12: 150.0000 mg | ORAL_TABLET | Freq: Two times a day (BID) | ORAL | 1 refills | Status: DC

## 2018-04-26 MED ORDER — PREDNISONE 10 MG PO TABS: ORAL_TABLET | ORAL | 0 refills | Status: DC

## 2018-04-26 NOTE — Telephone Encounter (Signed)
Looks like he was referred to Dr. Kathyrn Sheriff (P) [phone]

## 2018-04-26 NOTE — Progress Notes (Signed)
Subjective:    Patient ID: Chase Lloyd, male    DOB: [birth year], 62 y.o.   MRN: [id number]  HPI Patient presents for yearly preventative medicine examination. He is a pleasant 62 year old male who  has a past medical history of Back pain, COPD (chronic obstructive pulmonary disease) ([place]), DDD (degenerative disc disease), lumbar, Depression, Emphysema of lung ([place]), Empyema ([employer]), GERD (gastroesophageal reflux disease), Hodgkin's disease ([place]) (1999), Hypotension, Meniere's disease, Pneumonia, and Tinnitus.  COPD GOLD III -uses albuterol inhaler as needed, Symbicort daily. He is followed by Dr. Melvyn Novas, has no upcoming appointment. Audible wheezing when he is walking down the hallway and at rest. He reports that this has been going on for four months.   GERD -controlled with omeprazole 40 mg daily  Tobacco Use - he is smoking a pack every four days. He has tried chantix in the past but had little success with this medication in the past.  He is interested in trying another medication  Left Hip Pain -has had nerve blocks in the past by neurosurgery, which he reports works well.  Reports increasing pain in his left hip has not made an appointment with neurosurgery yet, he plans on doing so.  All immunizations and health maintenance protocols were reviewed with the patient and needed orders were placed. UTD on vaccinations   Appropriate screening laboratory values were ordered for the patient including screening of hyperlipidemia, renal function and hepatic function. If indicated by BPH, a PSA was ordered.  Medication reconciliation,  past medical history, social history, problem list and allergies were reviewed in detail with the patient  Goals were established with regard to weight loss, exercise, and  diet in compliance with medications  His last colonoscopy was in 2017 ( in [place]), he is due in 2022. He does participate in routine dental and vision screens.    Review of Systems    Constitutional: Negative.   HENT: Negative.   Eyes: Negative.   Respiratory: Positive for shortness of breath and wheezing.   Cardiovascular: Negative.   Gastrointestinal: Negative.   Endocrine: Negative.   Genitourinary: Negative.   Musculoskeletal: Positive for arthralgias, back pain and gait problem.  Skin: Negative.   Allergic/Immunologic: Negative.   Hematological: Negative.   Psychiatric/Behavioral: Negative.   All other systems reviewed and are negative.     Past Medical History:  Diagnosis Date  . Back pain   . COPD (chronic obstructive pulmonary disease) ([place])   . DDD (degenerative disc disease), lumbar   . Depression   . Emphysema of lung ([place])   . Empyema ([place])   . GERD (gastroesophageal reflux disease)   . Hodgkin's disease ([place]) 1999  . Hypotension   . Meniere's disease   . Pneumonia   . Tinnitus     Social History   Socioeconomic History  . Marital status: Married    Spouse name: Not on file  . Number of children: Not on file  . Years of education: Not on file  . Highest education level: Not on file  Occupational History  . Occupation: Retired  Scientific laboratory technician  . Financial resource strain: Not on file  . Food insecurity:    Worry: Not on file    Inability: Not on file  . Transportation needs:    Medical: Not on file    Non-medical: Not on file  Tobacco Use  . Smoking status: Former Smoker    Packs/day: 1.50    Years: 47.00    Pack  years: 70.50    Types: Cigarettes    Last attempt to quit: 03/03/2017    Years since quitting: 1.1  . Smokeless tobacco: Never Used  Substance and Sexual Activity  . Alcohol use: Yes    Comment: "4-6 beers daily"  . Drug use: No  . Sexual activity: Not on file  Lifestyle  . Physical activity:    Days per week: Not on file    Minutes per session: Not on file  . Stress: Not on file  Relationships  . Social connections:    Talks on phone: Not on file    Gets together: Not on file    Attends religious service:  Not on file    Active member of club or organization: Not on file    Attends meetings of clubs or organizations: Not on file    Relationship status: Not on file  . Intimate partner violence:    Fear of current or ex partner: Not on file    Emotionally abused: Not on file    Physically abused: Not on file    Forced sexual activity: Not on file  Other Topics Concern  . Not on file  Social History Narrative   Retied - worked as Furniture conservator/restorer        Past Surgical History:  Procedure Laterality Date  . BACK SURGERY    . CERVICAL FUSION  2016  . ELBOW SURGERY    . LAMINECTOMY  1999  . lower back surgery      Family History  Problem Relation Age of Onset  . Hypertension Father   . Diabetes Father   . Testicular cancer Brother   . Melanoma Brother     No Known Allergies  Current Outpatient Medications on File Prior to Visit  Medication Sig Dispense Refill  . fluticasone (FLONASE) 50 MCG/ACT nasal spray Place 2 sprays into both nostrils daily. 16 g 6  . gabapentin (NEURONTIN) 300 MG capsule Take 300 mg by mouth 3 (three) times daily.    Marland Kitchen ibuprofen (ADVIL,MOTRIN) 800 MG tablet Take 800 mg by mouth 3 (three) times daily.    . naproxen (NAPROSYN) 500 MG tablet Take 1 tablet (500 mg total) by mouth 2 (two) times daily with a meal. 30 tablet 0  . Tiotropium Bromide Monohydrate (SPIRIVA RESPIMAT) 2.5 MCG/ACT AERS Inhale 2 puffs into the lungs daily. 1 Inhaler 11  . varenicline (CHANTIX CONTINUING MONTH PAK) 1 MG tablet Take 1 tablet (1 mg total) by mouth 2 (two) times daily. 60 tablet 1   No current facility-administered medications on file prior to visit.     BP (!) 136/40   Temp 98.5 F (36.9 C) (Oral)   Ht 5\' 9"  (1.753 m)   Wt 172 lb (78 kg)   BMI 25.40 kg/m       Objective:   Physical Exam  Constitutional: He is oriented to person, place, and time. He appears well-developed and well-nourished. No distress.  HENT:  Head: Normocephalic and atraumatic.  Right Ear:  External ear normal.  Left Ear: External ear normal.  Nose: Nose normal.  Mouth/Throat: Oropharynx is clear and moist. No oropharyngeal exudate.  Eyes: Pupils are equal, round, and reactive to light. Conjunctivae and EOM are normal. Right eye exhibits no discharge. Left eye exhibits no discharge. No scleral icterus.  Neck: No JVD present. No tracheal deviation present. No thyromegaly present.  Cardiovascular: Normal rate, regular rhythm, normal heart sounds and intact distal pulses. Exam reveals no gallop and no  friction rub.  No murmur heard. Pulmonary/Chest: Effort normal. No respiratory distress. He has wheezes. He has no rales. He exhibits no tenderness.  Decreased breath sounds throughout   Abdominal: Soft. Bowel sounds are normal. He exhibits no distension and no mass. There is no tenderness. There is no rebound and no guarding. No hernia.  Genitourinary:  Genitourinary Comments: Will do PSA  Musculoskeletal: Normal range of motion. He exhibits no edema, tenderness or deformity.  Walks with limping gait   Lymphadenopathy:    He has no cervical adenopathy.  Neurological: He is alert and oriented to person, place, and time. He displays normal reflexes. No cranial nerve deficit or sensory deficit. He exhibits normal muscle tone. Coordination normal.  Skin: Skin is warm and dry. Capillary refill takes less than 2 seconds. No rash noted. He is not diaphoretic. No erythema. No pallor.  Psychiatric: He has a normal mood and affect. His behavior is normal. Judgment and thought content normal.  Nursing note and vitals reviewed.     Assessment & Plan:  1. COPD GOLD III -Audible wheezing and decreased breath sounds.  Advised him that I would refill his Symbicort and albuterol inhaler, but he needs to follow-up with Dr. Shyrl Numbers as there is concern on my behalf that his COPD is becoming worse.  We will get a chest x-ray and start him on prednisone.  Consider antibiotics once chest x-ray returns - CBC  with Differential/Platelet; Future - Basic metabolic panel; Future - Hepatic function panel; Future - Lipid panel; Future - TSH; Future - DG Chest 2 View; Future - albuterol (PROVENTIL HFA;VENTOLIN HFA) 108 (90 Base) MCG/ACT inhaler; Inhale 2 puffs into the lungs every 6 (six) hours as needed for wheezing or shortness of breath.  Dispense: 6.7 g; Refill: 1 - SYMBICORT 160-4.5 MCG/ACT inhaler; Inhale 2 puffs into the lungs daily.  Dispense: 1 Inhaler; Refill: 0 - predniSONE (DELTASONE) 10 MG tablet; 40 mg x 3 days, 20 mg x 3 days, 10 mg x 3 days  Dispense: 21 tablet; Refill: 0  2. Cigarette smoker -Educated on the importance of quitting smoking.  We will try him on Wellbutrin.  Please follow-up in 1 month - CBC with Differential/Platelet; Future - Basic metabolic panel; Future - Hepatic function panel; Future - Lipid panel; Future - TSH; Future - buPROPion (WELLBUTRIN SR) 150 MG 12 hr tablet; Take 1 tablet (150 mg total) by mouth 2 (two) times daily.  Dispense: 180 tablet; Refill: 1  3. Need for hepatitis C screening test  - Hep C Antibody; Future  4. Encounter for screening for HIV  - HIV antibody; Future  5. Gastroesophageal reflux disease, esophagitis presence not specified  - omeprazole (PRILOSEC) 40 MG capsule; Take 1 capsule (40 mg total) by mouth daily.  Dispense: 90 capsule; Refill: 3  6. Prostate cancer screening  - PSA; Future  7. Hyperlipidemia, unspecified hyperlipidemia type - Consider statin  - CBC with Differential/Platelet; Future - Basic metabolic panel; Future - Hepatic function panel; Future - Lipid panel; Future - TSH; Future  8. Left hip pain - Follow up with Neuro surgery for another nerve block   [name], NP

## 2018-04-26 NOTE — Patient Instructions (Addendum)
Dr. Ileene Rubens was the Sports Medicine Doctor who did your injection   Please go to the [facility] and chest xray   [address], [place], [place] [address]  Please follow up for your blood work

## 2018-04-26 NOTE — Telephone Encounter (Signed)
Called and spoke with patient. He requested that I call back and leave into on his VM as he was currently driving. Information was left on VM.

## 2018-04-26 NOTE — Telephone Encounter (Signed)
The patient requests another nerve block. He states he can't remember if Dr. Paulla Fore is the provider who did his first nerve block.

## 2018-04-28 ENCOUNTER — Ambulatory Visit (INDEPENDENT_AMBULATORY_CARE_PROVIDER_SITE_OTHER): Payer: Medicare Other | Admitting: Internal Medicine

## 2018-04-28 ENCOUNTER — Other Ambulatory Visit: Payer: Self-pay | Admitting: Family Medicine

## 2018-04-28 ENCOUNTER — Encounter: Payer: Self-pay | Admitting: Internal Medicine

## 2018-04-28 VITALS — BP 132/60 | HR 90 | Temp 98.4°F | Ht 69.0 in | Wt 173.0 lb

## 2018-04-28 DIAGNOSIS — J449 Chronic obstructive pulmonary disease, unspecified: Secondary | ICD-10-CM | POA: Diagnosis not present

## 2018-04-28 DIAGNOSIS — F17210 Nicotine dependence, cigarettes, uncomplicated: Secondary | ICD-10-CM | POA: Diagnosis not present

## 2018-04-28 MED ORDER — SYMBICORT 160-4.5 MCG/ACT IN AERO: 2.0000 | INHALATION_SPRAY | Freq: Every day | RESPIRATORY_TRACT | 11 refills | Status: DC

## 2018-04-28 MED ORDER — ALBUTEROL SULFATE HFA 108 (90 BASE) MCG/ACT IN AERS: 2.0000 | INHALATION_SPRAY | Freq: Four times a day (QID) | RESPIRATORY_TRACT | 1 refills | Status: DC | PRN

## 2018-04-28 MED ORDER — DOXYCYCLINE HYCLATE 100 MG PO TABS: 100.0000 mg | ORAL_TABLET | Freq: Two times a day (BID) | ORAL | 0 refills | Status: DC

## 2018-04-28 NOTE — Patient Instructions (Addendum)
Try taking omeprazole Take 30-60 min before first meal of the day   GERD (REFLUX)  is an extremely common cause of respiratory symptoms just like yours , many times with no obvious heartburn at all.    It can be treated with medication, but also with lifestyle changes including elevation of the head of your bed (ideally with 6 inch  bed blocks),  Smoking cessation, avoidance of late meals, excessive alcohol, and avoid fatty foods, chocolate, peppermint, colas, red wine, and acidic juices such as orange juice.  NO MINT OR MENTHOL PRODUCTS SO NO COUGH DROPS  USE SUGARLESS CANDY INSTEAD (Jolley ranchers or Stover's or Life Savers) or even ice chips will also do - the key is to swallow to prevent all throat clearing. NO OIL BASED VITAMINS - use powdered substitutes.    Plan A = Automatic = Symbicort  160 Take 2 puffs first thing in am and then another 2 puffs about 12 hours later.   Plan B = Backup Only use your albuterol as a rescue medication to be used if you can't catch your breath by resting or doing a relaxed purse lip breathing pattern.  - The less you use it, the better it will work when you need it. - Ok to use the inhaler up to 2 puffs  every 4 hours if you must but call for appointment if use goes up over your usual need - Don't leave home without it !!  (think of it like the spare tire for your car)     Finish the prednisone and antibiotic arleady prescribed    The key is to stop smoking completely before smoking completely stops you!   Please schedule a follow up visit in 3 months but call sooner if needed pft's

## 2018-04-28 NOTE — Progress Notes (Signed)
Subjective:    Patient ID: Chase Lloyd, male    DOB: Oct 04, 1956,     MRN: [id number]      Brief patient profile:  21 yowm active smoker from  [place]  first aware of doe 2013 dx as copd rx symbicort helped a lot then pna/ sepsis Feb 2017 and added spiriva and rx 02 short term and moved to [place] may 2018 and referred to pulmonary clinic 05/29/2017 by [name] with GOLD III criteria at initial eval    History of Present Illness  05/29/2017 1st [facility] office visit/ Chase Lloyd  Maint rx symb 160 2bid / spiriva dpi  Chief Complaint  Patient presents with  . Pulmonary Consult    Pt referred by Dr. [name] for COPD. Pt c/o DOE, prod cough with yellow mucus. Pt denies CP/tightness and f/c/s. Pt states he rarely uses his albuterol hfa.   ok at rest, sleeps fine with improving cough since quit smoking  Left leg swelling x months assoc with hip pain > ortho following with 'neg sonogram"  per pt  rec Plan A = Automatic = Symbicort 160 Take 2 puffs first thing in am and then another 2 puffs about 12 hours later and spiriva 2 puffs in am only (ok to use up the powder form if you want)  Work on inhaler technique:  relax and gently blow all the way out then take a nice smooth deep breath back in, triggering the inhaler at same time you start breathing in.  Hold for up to 5 seconds if you can. Blow out thru nose. Rinse and gargle with water when done Plan B = Backup Only use your albuterol as a rescue medication   07/30/2017  f/u ov/Chase Lloyd re:  GOLD III with reversibility / symb 160/spiriva smi Chief Complaint  Patient presents with  . Follow-up    PFT's done today. His breathing is doing well. He has not had to use his rescue inhaler. No new co's today.   Not limited by breathing from desired activities but  Due to back and hip and leg issues  Sleeping fine/ no noct saba or am flares rec Work on maintaining perfect inhaler technique:  relax and gently blow all the way out then take a  nice smooth deep breath back in, triggering the inhaler at same time you start breathing in.    04/28/2018 acute extended ov/Chase Lloyd re:  GOLD III/  Chief Complaint  Patient presents with  . Acute Visit    increased SOB x 2 months. He also c/o occ cough- prod at times with yellow sputum. He states he was just told by PCP today that he had bronchitis based on recent cxr 04/26/18. He is using his albuterol inhaler about every other day.   dyspnea gradually worse x 2 months to point = .MMRC2 = can't walk a nl pace on a flat grade s sob but does fine slow and flat but also limited back and L hip pain   02 none  saba qod   Dysphagia x years on prilosec q am but not ac   Off spiriva x months not convinced made any difference  Already on pred/doxy per PCP since 04/26/18    No obvious day to day or daytime variability or assoc excess/ purulent sputum or mucus plugs or hemoptysis or cp or chest tightness, subjective wheeze or overt sinus or hb symptoms.   Sleep ok on side  without nocturnal  or early am exacerbation  of respiratory  c/o's or need for noct saba. Also denies any obvious fluctuation of symptoms with weather or environmental changes or other aggravating or alleviating factors except as outlined above   No unusual exposure hx or h/o childhood pna/ asthma or knowledge of premature birth.  Current Allergies, Complete Past Medical History, Past Surgical History, Family History, and Social History were reviewed in [employer] record.  ROS  The following are not active complaints unless bolded Hoarseness, sore throat, dysphagia, dental problems, itching, sneezing,  nasal congestion or discharge of excess mucus or purulent secretions, ear ache,   fever, chills, sweats, unintended wt loss or wt gain, classically pleuritic or exertional cp,  orthopnea pnd or arm/hand swelling  or leg swelling, presyncope, palpitations, abdominal pain, anorexia, nausea, vomiting, diarrhea  or  change in bowel habits or change in bladder habits, change in stools or change in urine, dysuria, hematuria,  rash, arthralgias, visual complaints, headache, numbness, weakness or ataxia or problems with walking or coordination,  change in mood or  memory.        Current Meds  Medication Sig  . albuterol (PROVENTIL HFA;VENTOLIN HFA) 108 (90 Base) MCG/ACT inhaler Inhale 2 puffs into the lungs every 6 (six) hours as needed for wheezing or shortness of breath.  Marland Kitchen buPROPion (WELLBUTRIN SR) 150 MG 12 hr tablet Take 1 tablet (150 mg total) by mouth 2 (two) times daily.  . fluticasone (FLONASE) 50 MCG/ACT nasal spray Place 2 sprays into both nostrils daily.  Marland Kitchen gabapentin (NEURONTIN) 300 MG capsule Take 300 mg by mouth 3 (three) times daily.  Marland Kitchen ibuprofen (ADVIL,MOTRIN) 800 MG tablet Take 800 mg by mouth 3 (three) times daily.  . naproxen (NAPROSYN) 500 MG tablet Take 1 tablet (500 mg total) by mouth 2 (two) times daily with a meal.  . omeprazole (PRILOSEC) 40 MG capsule Take 1 capsule (40 mg total) by mouth daily.  . predniSONE (DELTASONE) 10 MG tablet 40 mg x 3 days, 20 mg x 3 days, 10 mg x 3 days  . SYMBICORT 160-4.5 MCG/ACT inhaler Inhale 2 puffs into the lungs daily.  Marland Kitchen   albuterol (PROVENTIL HFA;VENTOLIN HFA) 108 (90 Base) MCG/ACT inhaler Inhale 2 puffs into the lungs every 6 (six) hours as needed for wheezing or shortness of breath.  .   SYMBICORT 160-4.5 MCG/ACT inhaler Inhale 2 puffs into the lungs daily.  . [DISCONTINUED] varenicline (CHANTIX CONTINUING MONTH PAK) 1 MG tablet Take 1 tablet (1 mg total) by mouth 2 (two) times daily.                           Objective:   Physical Exam   amb wm nad   04/28/2018        173  07/30/2017        173  05/29/17 168 lb (76.2 kg)  05/01/17 164 lb (74.4 kg)  04/15/17 162 lb (73.5 kg)    Vital signs reviewed - Note on arrival 02 sats  97% on RA             HEENT: nl   oropharynx. Nl external ear canals without cough reflex - mild  bilateral non-specific turbinate edema  / full dentures    NECK :  without JVD/Nodes/TM/ nl carotid upstrokes bilaterally   LUNGS: no acc muscle use,  Mod barrel  contour chest wall with bilateral distant pan exp rhonchi  and  without cough on insp or exp maneuver  and mod   Hyperresonant  to  percussion bilaterally     CV:  RRR  no s3 or murmur or increase in P2, and minimal ankle pitting L > R   ABD:  soft and nontender with pos mid insp Hoover's  in the supine position. No bruits or organomegaly appreciated, bowel sounds nl  MS:   Nl gait/  ext warm without deformities, calf tenderness, cyanosis or clubbing No obvious joint restrictions   SKIN: warm and dry without lesions    NEURO:  alert, approp, nl sensorium with  no motor or cerebellar deficits apparent.       I personally reviewed images and agree with radiology impression as follows:  CXR:   04/26/18 COPD. Coarse lung markings diffusely which appears stable. However, mildly increased lung markings are present in the right mid and lower lung which may reflect superimposed acute bronchitis. No CHF.    Assessment & Plan:

## 2018-04-29 ENCOUNTER — Encounter: Payer: Self-pay | Admitting: Internal Medicine

## 2018-04-29 NOTE — Assessment & Plan Note (Addendum)
Quit smoking 03/2017 -  Spirometry 05/29/2017  FEV1 1.37 (39%)  Ratio 47 p am symb x2 and spirva x one and abn effort dep portion  - 05/29/2017   > rec  change  spiriva to  respimat and symb 160 2bid s spacer   - PFT's  07/30/2017  FEV1 1.39 (38 % ) ratio 48  p 23 % improvement from saba p nothing prior to study with DLCO  39/42 % corrects to 59 % for alv volume    - 04/28/2018  After extensive coaching inhaler device  effectiveness =    75% with hfa (short Ti)     May be that he worsened off spiriva but part of the problem may also be related to poorly controlled gerd so rec  1) finish abx/pred 2) change ppi to take ac and diet/ reviewed 3) if not back to where he wants to be or continues to flare on symb 160 2 bid then add back spriva  4) work on smoking cessation per pcp (see separate a/p)    I had an extended discussion with the patient reviewing all relevant studies completed to date and  lasting 15 to 20 minutes of a 25 minute visit    See device teaching which extended face to face time for this visit.  Each maintenance medication was reviewed in detail including emphasizing most importantly the difference between maintenance and prns and under what circumstances the prns are to be triggered using an action plan format that is not reflected in the computer generated alphabetically organized AVS which I have not found useful in most complex patients, especially with respiratory illnesses  Please see AVS for specific instructions unique to this visit that I personally wrote and verbalized to the the pt in detail and then reviewed with pt  by my nurse highlighting any  changes in therapy recommended at today's visit to their plan of care.

## 2018-04-29 NOTE — Assessment & Plan Note (Signed)
4-5 min discussion re active cigarette smoking in addition to office E&M  Ask about tobacco use:   active Advise quitting   I emphasized that although we never turn away smokers from the pulmonary clinic, we do ask that they understand that the recommendations that we make  won't work nearly as well in the presence of continued cigarette exposure. In fact, we may very well  reach a point where we can't promise to help the patient if he/she can't quit smoking. (We can and will promise to try to help, we just can't promise what we recommend will really work)  Assess willingness:  Not committed at this point Assist in quit attempt:  Per PCP when ready Arrange follow up:   Follow up per Primary Care planned

## 2018-04-30 ENCOUNTER — Other Ambulatory Visit: Payer: Self-pay | Admitting: Adult Health

## 2018-05-04 NOTE — Telephone Encounter (Signed)
Spoke to the pt and advised he needs to come in for his lab work.  Pt states he can come later this week or next.  Medication sent in for 90 days supply with 0 refills.

## 2018-05-05 ENCOUNTER — Other Ambulatory Visit (INDEPENDENT_AMBULATORY_CARE_PROVIDER_SITE_OTHER): Payer: Medicare Other

## 2018-05-05 DIAGNOSIS — Z1159 Encounter for screening for other viral diseases: Secondary | ICD-10-CM

## 2018-05-05 DIAGNOSIS — J449 Chronic obstructive pulmonary disease, unspecified: Secondary | ICD-10-CM | POA: Diagnosis not present

## 2018-05-05 DIAGNOSIS — F17210 Nicotine dependence, cigarettes, uncomplicated: Secondary | ICD-10-CM | POA: Diagnosis not present

## 2018-05-05 DIAGNOSIS — Z114 Encounter for screening for human immunodeficiency virus [HIV]: Secondary | ICD-10-CM

## 2018-05-05 DIAGNOSIS — E785 Hyperlipidemia, unspecified: Secondary | ICD-10-CM

## 2018-05-05 DIAGNOSIS — Z125 Encounter for screening for malignant neoplasm of prostate: Secondary | ICD-10-CM

## 2018-05-05 LAB — BASIC METABOLIC PANEL
BUN: 18 mg/dL (ref 6–23)
CO2: 32 meq/L (ref 19–32)
CREATININE: 1.02 mg/dL (ref 0.40–1.50)
Calcium: 9.5 mg/dL (ref 8.4–10.5)
Chloride: 100 mEq/L (ref 96–112)
GFR: 78.23 mL/min (ref >60.00)
GLUCOSE: 83 mg/dL (ref 70–99)
Potassium: 3.6 mEq/L (ref 3.5–5.1)
Sodium: 141 mEq/L (ref 135–145)

## 2018-05-05 LAB — CBC WITH DIFFERENTIAL/PLATELET
Basophils Absolute: 0 10*3/uL (ref 0.0–0.1)
Basophils Relative: 0.4 % (ref 0.0–3.0)
EOS ABS: 0.1 10*3/uL (ref 0.0–0.7)
Eosinophils Relative: 1.3 % (ref 0.0–5.0)
HCT: 41.6 % (ref 39.0–52.0)
Hemoglobin: 14.3 g/dL (ref 13.0–17.0)
Lymphocytes Relative: 19.1 % (ref 12.0–46.0)
Lymphs Abs: 1 10*3/uL (ref 0.7–4.0)
MCHC: 34.3 g/dL (ref 30.0–36.0)
MCV: 98.8 fl (ref 78.0–100.0)
MONO ABS: 0.4 10*3/uL (ref 0.1–1.0)
Monocytes Relative: 7.2 % (ref 3.0–12.0)
NEUTROS ABS: 4 10*3/uL (ref 1.4–7.7)
Neutrophils Relative %: 72 % (ref 43.0–77.0)
PLATELETS: 138 10*3/uL — AB (ref 150.0–400.0)
RBC: 4.21 Mil/uL — ABNORMAL LOW (ref 4.22–5.81)
RDW: 13.7 % (ref 11.5–15.5)
WBC: 5.5 10*3/uL (ref 4.0–10.5)

## 2018-05-05 LAB — LIPID PANEL
CHOL/HDL RATIO: 3
Cholesterol: 182 mg/dL (ref 0–200)
HDL: 60.5 mg/dL (ref >39.00)
LDL Cholesterol: 91 mg/dL (ref 0–99)
NONHDL: 121.29
Triglycerides: 153 mg/dL — ABNORMAL HIGH (ref 0.0–149.0)
VLDL: 30.6 mg/dL (ref 0.0–40.0)

## 2018-05-05 LAB — HEPATIC FUNCTION PANEL
ALT: 13 U/L (ref 0–53)
AST: 15 U/L (ref 0–37)
Albumin: 4.1 g/dL (ref 3.5–5.2)
Alkaline Phosphatase: 64 U/L (ref 39–117)
BILIRUBIN DIRECT: 0.2 mg/dL (ref 0.0–0.3)
BILIRUBIN TOTAL: 0.9 mg/dL (ref 0.2–1.2)
Total Protein: 6.9 g/dL (ref 6.0–8.3)

## 2018-05-05 LAB — PSA: PSA: 0.45 ng/mL (ref 0.10–4.00)

## 2018-05-05 LAB — TSH: TSH: 7.17 u[IU]/mL — ABNORMAL HIGH (ref 0.35–4.50)

## 2018-05-06 LAB — HEPATITIS C ANTIBODY
HEP C AB: NONREACTIVE
SIGNAL TO CUT-OFF: 0.02 (ref <1.00)

## 2018-05-06 LAB — HIV ANTIBODY (ROUTINE TESTING W REFLEX): HIV: NONREACTIVE

## 2018-05-10 ENCOUNTER — Telehealth: Payer: Self-pay | Admitting: Internal Medicine

## 2018-05-10 DIAGNOSIS — J449 Chronic obstructive pulmonary disease, unspecified: Secondary | ICD-10-CM

## 2018-05-10 MED ORDER — SYMBICORT 160-4.5 MCG/ACT IN AERO: 2.0000 | INHALATION_SPRAY | Freq: Two times a day (BID) | RESPIRATORY_TRACT | 11 refills | Status: DC

## 2018-05-10 NOTE — Telephone Encounter (Signed)
I received a fax from [facility]  Rx sent in error- should be 2 puffs bid  I re-sent the correct rx  LMTCB for [facility]

## 2018-05-10 NOTE — Telephone Encounter (Signed)
Pt's spouse returned Leslie's call Advised Maryann on Leslie's message below [place] voiced her understanding and denied any questions/concerns  Nothing further needed; will sign off

## 2018-05-19 DIAGNOSIS — M48062 Spinal stenosis, lumbar region with neurogenic claudication: Secondary | ICD-10-CM | POA: Diagnosis not present

## 2018-05-27 ENCOUNTER — Other Ambulatory Visit: Payer: Self-pay | Admitting: Family Medicine

## 2018-05-27 DIAGNOSIS — R7989 Other specified abnormal findings of blood chemistry: Secondary | ICD-10-CM

## 2018-05-31 ENCOUNTER — Other Ambulatory Visit (INDEPENDENT_AMBULATORY_CARE_PROVIDER_SITE_OTHER): Payer: Medicare Other

## 2018-05-31 DIAGNOSIS — R7989 Other specified abnormal findings of blood chemistry: Secondary | ICD-10-CM

## 2018-05-31 LAB — TSH: TSH: 6.4 u[IU]/mL — AB (ref 0.35–4.50)

## 2018-05-31 LAB — T4, FREE: Free T4: 0.94 ng/dL (ref 0.60–1.60)

## 2018-05-31 LAB — T3, FREE: T3 FREE: 3.8 pg/mL (ref 2.3–4.2)

## 2018-06-30 DIAGNOSIS — M48062 Spinal stenosis, lumbar region with neurogenic claudication: Secondary | ICD-10-CM | POA: Diagnosis not present

## 2018-07-02 ENCOUNTER — Other Ambulatory Visit: Payer: Self-pay | Admitting: Neurosurgery

## 2018-07-07 ENCOUNTER — Other Ambulatory Visit: Payer: Self-pay | Admitting: Neurosurgery

## 2018-07-14 ENCOUNTER — Encounter (HOSPITAL_COMMUNITY)
Admission: RE | Admit: 2018-07-14 | Discharge: 2018-07-14 | Disposition: A | Discharge status: Home / Self Care | Payer: Medicare Other | Source: Ambulatory Visit | Attending: Neurosurgery | Admitting: Neurosurgery

## 2018-07-14 ENCOUNTER — Other Ambulatory Visit: Payer: Self-pay

## 2018-07-14 ENCOUNTER — Encounter (HOSPITAL_COMMUNITY): Payer: Self-pay

## 2018-07-14 ENCOUNTER — Ambulatory Visit (HOSPITAL_COMMUNITY)
Admission: RE | Admit: 2018-07-14 | Discharge: 2018-07-14 | Disposition: A | Discharge status: Home / Self Care | Payer: Medicare Other | Source: Ambulatory Visit | Attending: Neurosurgery | Admitting: Neurosurgery

## 2018-07-14 DIAGNOSIS — Z01818 Encounter for other preprocedural examination: Secondary | ICD-10-CM

## 2018-07-14 DIAGNOSIS — R05 Cough: Secondary | ICD-10-CM | POA: Diagnosis not present

## 2018-07-14 HISTORY — DX: Unspecified hearing loss, right ear: H91.91

## 2018-07-14 LAB — TYPE AND SCREEN
ABO/RH(D): O POS
Antibody Screen: NEGATIVE

## 2018-07-14 LAB — BASIC METABOLIC PANEL
ANION GAP: 10 (ref 5–15)
BUN: 11 mg/dL (ref 8–23)
CO2: 26 mmol/L (ref 22–32)
Calcium: 9.4 mg/dL (ref 8.9–10.3)
Chloride: 103 mmol/L (ref 98–111)
Creatinine, Ser: 1.18 mg/dL (ref 0.61–1.24)
GFR calc Af Amer: >60 mL/min (ref >60)
Glucose, Bld: 99 mg/dL (ref 70–99)
POTASSIUM: 3.5 mmol/L (ref 3.5–5.1)
Sodium: 139 mmol/L (ref 135–145)

## 2018-07-14 LAB — CBC
HCT: 42.7 % (ref 39.0–52.0)
HEMOGLOBIN: 13.8 g/dL (ref 13.0–17.0)
MCH: 32.4 pg (ref 26.0–34.0)
MCHC: 32.3 g/dL (ref 30.0–36.0)
MCV: 100.2 fL — AB (ref 78.0–100.0)
PLATELETS: 152 10*3/uL (ref 150–400)
RBC: 4.26 MIL/uL (ref 4.22–5.81)
RDW: 12.7 % (ref 11.5–15.5)
WBC: 5.9 10*3/uL (ref 4.0–10.5)

## 2018-07-14 LAB — SURGICAL PCR SCREEN
MRSA, PCR: NEGATIVE
Staphylococcus aureus: NEGATIVE

## 2018-07-14 LAB — ABO/RH: ABO/RH(D): O POS

## 2018-07-14 NOTE — Pre-Procedure Instructions (Signed)
Chase Lloyd  07/14/2018      [facility], [place] [address] [place] [place] [address] Phone: [phone] Fax: [phone]    Your procedure is scheduled on Friday, Sept. 13th   Report to [facility] Admitting at 10:20 AM             (posted surgery time 12:20p - 5:41p)   Call this number if you have problems the morning of surgery:  [phone]   Remember:   Do not eat any foods or drink any liquids after midnight, Thursday.              4-5 days prior to surgery, STOP TAKING any Vitamins, Herbal Supplements, Anti-inflammatoies.   Take these medicines the morning of surgery with A SIP OF WATER : Wellbutrin SR, Prilosec.  Please use your inhalers that morning.    Do not wear jewelry - no rings or watches.                                                                   Do not wear lotions, colognes or deodorant.             Men may shave face and neck.  Do not bring valuables to the hospital.  [facility] is not responsible for any belongings or valuables.  Contacts, dentures or bridgework may not be worn into surgery.  Leave your suitcase in the car.  After surgery it may be brought to your room.  For patients admitted to the hospital, discharge time will be determined by your treatment team.  Please read over the following fact sheets that you were given. MRSA Information and Surgical Site Infection Prevention     [facility]- Preparing For Surgery  Before surgery, you can play an important role. Because skin is not sterile, your skin needs to be as free of germs as possible. You can reduce the number of germs on your skin by washing with CHG (chlorahexidine gluconate) Soap before surgery.  CHG is an antiseptic cleaner which kills germs and bonds with the skin to continue killing germs even after washing.    Oral Hygiene is also important to reduce your risk of infection.    Remember - BRUSH YOUR  TEETH THE MORNING OF SURGERY WITH YOUR REGULAR TOOTHPASTE  Please do not use if you have an allergy to CHG or antibacterial soaps. If your skin becomes reddened/irritated stop using the CHG.  Do not shave (including legs and underarms) for at least 48 hours prior to first CHG shower. It is OK to shave your face.  Please follow these instructions carefully.   1. Shower the NIGHT BEFORE SURGERY and the MORNING OF SURGERY with CHG.   2. If you chose to wash your hair, wash your hair first as usual with your normal shampoo.  3. After you shampoo, rinse your hair and body thoroughly to remove the shampoo.  4. Use CHG as you would any other liquid soap. You can apply CHG directly to the skin and wash gently with a scrungie or a clean washcloth.   5. Apply the CHG Soap to your body ONLY FROM THE NECK DOWN.  Do not use on open  wounds or open sores. Avoid contact with your eyes, ears, mouth and genitals (private parts). Wash Face and genitals (private parts)  with your normal soap.  6. Wash thoroughly, paying special attention to the area where your surgery will be performed.  7. Thoroughly rinse your body with warm water from the neck down.  8. DO NOT shower/wash with your normal soap after using and rinsing off the CHG Soap.  9. Pat yourself dry with a CLEAN TOWEL.  10. Wear CLEAN PAJAMAS to bed the night before surgery, wear comfortable clothes the morning of surgery  11. Place CLEAN SHEETS on your bed the night of your first shower and DO NOT SLEEP WITH PETS.  Day of Surgery:  Do not apply any deodorants/lotions.  Please wear clean clothes to the hospital/surgery center.    Remember to brush your teeth WITH YOUR REGULAR TOOTHPASTE.

## 2018-07-16 ENCOUNTER — Encounter (HOSPITAL_COMMUNITY)
Admission: RE | Disposition: A | Discharge status: Home / Self Care | Payer: Self-pay | Source: Home / Self Care | Attending: Neurosurgery

## 2018-07-16 ENCOUNTER — Encounter (HOSPITAL_COMMUNITY): Payer: Self-pay | Admitting: Critical Care Medicine

## 2018-07-16 ENCOUNTER — Inpatient Hospital Stay (HOSPITAL_COMMUNITY): Payer: Medicare Other | Admitting: Vascular Surgery

## 2018-07-16 ENCOUNTER — Inpatient Hospital Stay (HOSPITAL_COMMUNITY)
Admission: RE | Admit: 2018-07-16 | Discharge: 2018-07-17 | DRG: 455 | Disposition: A | Discharge status: Home / Self Care | Payer: Medicare Other | Attending: Neurosurgery | Admitting: Neurosurgery

## 2018-07-16 ENCOUNTER — Inpatient Hospital Stay (HOSPITAL_COMMUNITY): Payer: Medicare Other

## 2018-07-16 ENCOUNTER — Inpatient Hospital Stay (HOSPITAL_COMMUNITY): Payer: Medicare Other | Admitting: Critical Care Medicine

## 2018-07-16 DIAGNOSIS — Z8571 Personal history of Hodgkin lymphoma: Secondary | ICD-10-CM

## 2018-07-16 DIAGNOSIS — H8109 Meniere's disease, unspecified ear: Secondary | ICD-10-CM | POA: Diagnosis present

## 2018-07-16 DIAGNOSIS — Z87891 Personal history of nicotine dependence: Secondary | ICD-10-CM | POA: Diagnosis not present

## 2018-07-16 DIAGNOSIS — H9191 Unspecified hearing loss, right ear: Secondary | ICD-10-CM | POA: Diagnosis present

## 2018-07-16 DIAGNOSIS — M5137 Other intervertebral disc degeneration, lumbosacral region: Secondary | ICD-10-CM | POA: Diagnosis present

## 2018-07-16 DIAGNOSIS — M4726 Other spondylosis with radiculopathy, lumbar region: Secondary | ICD-10-CM | POA: Diagnosis present

## 2018-07-16 DIAGNOSIS — M48062 Spinal stenosis, lumbar region with neurogenic claudication: Principal | ICD-10-CM | POA: Diagnosis present

## 2018-07-16 DIAGNOSIS — G9619 Other disorders of meninges, not elsewhere classified: Secondary | ICD-10-CM | POA: Diagnosis present

## 2018-07-16 DIAGNOSIS — M48061 Spinal stenosis, lumbar region without neurogenic claudication: Secondary | ICD-10-CM | POA: Diagnosis present

## 2018-07-16 DIAGNOSIS — M2578 Osteophyte, vertebrae: Secondary | ICD-10-CM | POA: Diagnosis present

## 2018-07-16 DIAGNOSIS — K219 Gastro-esophageal reflux disease without esophagitis: Secondary | ICD-10-CM | POA: Diagnosis present

## 2018-07-16 DIAGNOSIS — J439 Emphysema, unspecified: Secondary | ICD-10-CM | POA: Diagnosis present

## 2018-07-16 DIAGNOSIS — M4316 Spondylolisthesis, lumbar region: Secondary | ICD-10-CM | POA: Diagnosis present

## 2018-07-16 DIAGNOSIS — M545 Low back pain: Secondary | ICD-10-CM | POA: Diagnosis not present

## 2018-07-16 DIAGNOSIS — Z79899 Other long term (current) drug therapy: Secondary | ICD-10-CM

## 2018-07-16 DIAGNOSIS — M4326 Fusion of spine, lumbar region: Secondary | ICD-10-CM | POA: Diagnosis not present

## 2018-07-16 DIAGNOSIS — I739 Peripheral vascular disease, unspecified: Secondary | ICD-10-CM | POA: Diagnosis present

## 2018-07-16 DIAGNOSIS — Z419 Encounter for procedure for purposes other than remedying health state, unspecified: Secondary | ICD-10-CM

## 2018-07-16 SURGERY — POSTERIOR LUMBAR FUSION 2 LEVEL
Anesthesia: General | Site: Spine Lumbar

## 2018-07-16 MED ORDER — ALBUTEROL SULFATE (2.5 MG/3ML) 0.083% IN NEBU: 3.0000 mL | INHALATION_SOLUTION | Freq: Four times a day (QID) | RESPIRATORY_TRACT | Status: DC | PRN

## 2018-07-16 MED ORDER — IPRATROPIUM-ALBUTEROL 0.5-2.5 (3) MG/3ML IN SOLN
RESPIRATORY_TRACT | Status: AC
  Administered 2018-07-16: 3 mL via RESPIRATORY_TRACT
  Filled 2018-07-16: qty 3

## 2018-07-16 MED ORDER — SUGAMMADEX SODIUM 200 MG/2ML IV SOLN
INTRAVENOUS | Status: DC | PRN
  Administered 2018-07-16: 200 mg via INTRAVENOUS

## 2018-07-16 MED ORDER — BUPIVACAINE HCL (PF) 0.5 % IJ SOLN
INTRAMUSCULAR | Status: DC | PRN
  Administered 2018-07-16: 5 mL

## 2018-07-16 MED ORDER — FENTANYL CITRATE (PF) 250 MCG/5ML IJ SOLN
INTRAMUSCULAR | Status: AC
  Filled 2018-07-16: qty 5

## 2018-07-16 MED ORDER — SODIUM CHLORIDE 0.9% FLUSH: 3.0000 mL | INTRAVENOUS | Status: DC | PRN

## 2018-07-16 MED ORDER — HYDROMORPHONE HCL 1 MG/ML IJ SOLN
INTRAMUSCULAR | Status: AC
  Administered 2018-07-16: 20:00:00
  Filled 2018-07-16: qty 1

## 2018-07-16 MED ORDER — SODIUM CHLORIDE 0.9 % IV SOLN
INTRAVENOUS | Status: DC | PRN
  Administered 2018-07-16: 500 mL

## 2018-07-16 MED ORDER — SENNA 8.6 MG PO TABS
1.0000 | ORAL_TABLET | Freq: Two times a day (BID) | ORAL | Status: DC
  Administered 2018-07-16 – 2018-07-17 (×2): 8.6 mg via ORAL
  Filled 2018-07-16 (×2): qty 1

## 2018-07-16 MED ORDER — PROPOFOL 10 MG/ML IV BOLUS
INTRAVENOUS | Status: AC
  Filled 2018-07-16: qty 20

## 2018-07-16 MED ORDER — THROMBIN 5000 UNITS EX SOLR
CUTANEOUS | Status: AC
  Filled 2018-07-16: qty 5000

## 2018-07-16 MED ORDER — SODIUM CHLORIDE 0.9% FLUSH: 3.0000 mL | Freq: Two times a day (BID) | INTRAVENOUS | Status: DC

## 2018-07-16 MED ORDER — CHLORHEXIDINE GLUCONATE CLOTH 2 % EX PADS: 6.0000 | MEDICATED_PAD | Freq: Once | CUTANEOUS | Status: DC

## 2018-07-16 MED ORDER — DEXAMETHASONE SODIUM PHOSPHATE 10 MG/ML IJ SOLN
INTRAMUSCULAR | Status: DC | PRN
  Administered 2018-07-16: 10 mg via INTRAVENOUS

## 2018-07-16 MED ORDER — ROCURONIUM BROMIDE 10 MG/ML (PF) SYRINGE
PREFILLED_SYRINGE | INTRAVENOUS | Status: DC | PRN
  Administered 2018-07-16: 50 mg via INTRAVENOUS
  Administered 2018-07-16 (×2): 20 mg via INTRAVENOUS
  Administered 2018-07-16: 10 mg via INTRAVENOUS

## 2018-07-16 MED ORDER — CEFAZOLIN SODIUM-DEXTROSE 2-4 GM/100ML-% IV SOLN
2.0000 g | Freq: Three times a day (TID) | INTRAVENOUS | Status: AC
  Administered 2018-07-16 – 2018-07-17 (×2): 2 g via INTRAVENOUS
  Filled 2018-07-16 (×2): qty 100

## 2018-07-16 MED ORDER — ONDANSETRON HCL 4 MG/2ML IJ SOLN
INTRAMUSCULAR | Status: DC | PRN
  Administered 2018-07-16 (×2): 4 mg via INTRAVENOUS

## 2018-07-16 MED ORDER — PROMETHAZINE HCL 25 MG/ML IJ SOLN: 6.2500 mg | INTRAMUSCULAR | Status: DC | PRN

## 2018-07-16 MED ORDER — PHENOL 1.4 % MT LIQD: 1.0000 | OROMUCOSAL | Status: DC | PRN

## 2018-07-16 MED ORDER — SODIUM CHLORIDE 0.9 % IV SOLN: INTRAVENOUS | Status: DC

## 2018-07-16 MED ORDER — ACETAMINOPHEN 500 MG PO TABS
1000.0000 mg | ORAL_TABLET | Freq: Four times a day (QID) | ORAL | Status: DC
  Administered 2018-07-16: 1000 mg via ORAL
  Filled 2018-07-16: qty 2

## 2018-07-16 MED ORDER — FLEET ENEMA 7-19 GM/118ML RE ENEM: 1.0000 | ENEMA | Freq: Once | RECTAL | Status: DC | PRN

## 2018-07-16 MED ORDER — MIDAZOLAM HCL 2 MG/2ML IJ SOLN
INTRAMUSCULAR | Status: AC
  Filled 2018-07-16: qty 2

## 2018-07-16 MED ORDER — OXYCODONE HCL 5 MG PO TABS: 5.0000 mg | ORAL_TABLET | Freq: Once | ORAL | Status: DC | PRN

## 2018-07-16 MED ORDER — HYDROCODONE-ACETAMINOPHEN 5-325 MG PO TABS
1.0000 | ORAL_TABLET | ORAL | Status: DC | PRN
  Administered 2018-07-16: 1 via ORAL
  Filled 2018-07-16: qty 1

## 2018-07-16 MED ORDER — CEFAZOLIN SODIUM-DEXTROSE 2-4 GM/100ML-% IV SOLN
INTRAVENOUS | Status: AC
  Filled 2018-07-16: qty 100

## 2018-07-16 MED ORDER — LIDOCAINE-EPINEPHRINE 1 %-1:100000 IJ SOLN
INTRAMUSCULAR | Status: AC
  Filled 2018-07-16: qty 1

## 2018-07-16 MED ORDER — ONDANSETRON HCL 4 MG PO TABS: 4.0000 mg | ORAL_TABLET | Freq: Four times a day (QID) | ORAL | Status: DC | PRN

## 2018-07-16 MED ORDER — IPRATROPIUM-ALBUTEROL 0.5-2.5 (3) MG/3ML IN SOLN
3.0000 mL | Freq: Four times a day (QID) | RESPIRATORY_TRACT | Status: DC
  Administered 2018-07-16: 3 mL via RESPIRATORY_TRACT

## 2018-07-16 MED ORDER — MIDAZOLAM HCL 5 MG/5ML IJ SOLN
INTRAMUSCULAR | Status: DC | PRN
  Administered 2018-07-16: 2 mg via INTRAVENOUS

## 2018-07-16 MED ORDER — SODIUM CHLORIDE 0.9 % IV SOLN: 250.0000 mL | INTRAVENOUS | Status: DC

## 2018-07-16 MED ORDER — PHENYLEPHRINE 40 MCG/ML (10ML) SYRINGE FOR IV PUSH (FOR BLOOD PRESSURE SUPPORT)
PREFILLED_SYRINGE | INTRAVENOUS | Status: DC | PRN
  Administered 2018-07-16 (×3): 80 ug via INTRAVENOUS

## 2018-07-16 MED ORDER — GABAPENTIN 300 MG PO CAPS
300.0000 mg | ORAL_CAPSULE | Freq: Three times a day (TID) | ORAL | Status: DC
  Administered 2018-07-16 – 2018-07-17 (×2): 300 mg via ORAL
  Filled 2018-07-16 (×2): qty 1

## 2018-07-16 MED ORDER — ACETAMINOPHEN 325 MG PO TABS: 650.0000 mg | ORAL_TABLET | ORAL | Status: DC | PRN

## 2018-07-16 MED ORDER — BISACODYL 10 MG RE SUPP: 10.0000 mg | Freq: Every day | RECTAL | Status: DC | PRN

## 2018-07-16 MED ORDER — HYDROMORPHONE HCL 1 MG/ML IJ SOLN
0.2500 mg | INTRAMUSCULAR | Status: DC | PRN
  Administered 2018-07-16 (×2): 0.5 mg via INTRAVENOUS

## 2018-07-16 MED ORDER — TAMSULOSIN HCL 0.4 MG PO CAPS
0.4000 mg | ORAL_CAPSULE | Freq: Every day | ORAL | Status: DC
  Administered 2018-07-16 – 2018-07-17 (×2): 0.4 mg via ORAL
  Filled 2018-07-16 (×3): qty 1

## 2018-07-16 MED ORDER — MENTHOL 3 MG MT LOZG: 1.0000 | LOZENGE | OROMUCOSAL | Status: DC | PRN

## 2018-07-16 MED ORDER — BUPIVACAINE HCL (PF) 0.5 % IJ SOLN
INTRAMUSCULAR | Status: AC
  Filled 2018-07-16: qty 30

## 2018-07-16 MED ORDER — DOCUSATE SODIUM 100 MG PO CAPS
100.0000 mg | ORAL_CAPSULE | Freq: Two times a day (BID) | ORAL | Status: DC
  Administered 2018-07-16 – 2018-07-17 (×2): 100 mg via ORAL
  Filled 2018-07-16 (×2): qty 1

## 2018-07-16 MED ORDER — LACTATED RINGERS IV SOLN
INTRAVENOUS | Status: DC
  Administered 2018-07-16 (×3): via INTRAVENOUS

## 2018-07-16 MED ORDER — METHOCARBAMOL 1000 MG/10ML IJ SOLN
500.0000 mg | Freq: Four times a day (QID) | INTRAVENOUS | Status: DC | PRN
  Filled 2018-07-16: qty 5

## 2018-07-16 MED ORDER — METHOCARBAMOL 500 MG PO TABS
500.0000 mg | ORAL_TABLET | Freq: Four times a day (QID) | ORAL | Status: DC | PRN
  Administered 2018-07-16 – 2018-07-17 (×3): 500 mg via ORAL
  Filled 2018-07-16 (×3): qty 1

## 2018-07-16 MED ORDER — ONDANSETRON HCL 4 MG/2ML IJ SOLN: 4.0000 mg | Freq: Four times a day (QID) | INTRAMUSCULAR | Status: DC | PRN

## 2018-07-16 MED ORDER — HYDROCODONE-ACETAMINOPHEN 5-325 MG PO TABS
2.0000 | ORAL_TABLET | ORAL | Status: DC | PRN
  Administered 2018-07-17 (×3): 2 via ORAL
  Filled 2018-07-16 (×3): qty 2

## 2018-07-16 MED ORDER — FENTANYL CITRATE (PF) 250 MCG/5ML IJ SOLN
INTRAMUSCULAR | Status: DC | PRN
  Administered 2018-07-16 (×3): 50 ug via INTRAVENOUS
  Administered 2018-07-16: 100 ug via INTRAVENOUS
  Administered 2018-07-16 (×5): 50 ug via INTRAVENOUS

## 2018-07-16 MED ORDER — SODIUM CHLORIDE 0.9 % IV SOLN
INTRAVENOUS | Status: DC | PRN
  Administered 2018-07-16: 20 ug/min via INTRAVENOUS

## 2018-07-16 MED ORDER — THROMBIN (RECOMBINANT) 5000 UNITS EX SOLR
CUTANEOUS | Status: AC
  Filled 2018-07-16: qty 5000

## 2018-07-16 MED ORDER — ALBUMIN HUMAN 5 % IV SOLN
INTRAVENOUS | Status: DC | PRN
  Administered 2018-07-16: 17:00:00 via INTRAVENOUS

## 2018-07-16 MED ORDER — ROCURONIUM BROMIDE 50 MG/5ML IV SOSY
PREFILLED_SYRINGE | INTRAVENOUS | Status: AC
  Filled 2018-07-16: qty 5

## 2018-07-16 MED ORDER — ACETAMINOPHEN 650 MG RE SUPP: 650.0000 mg | RECTAL | Status: DC | PRN

## 2018-07-16 MED ORDER — LIDOCAINE 2% (20 MG/ML) 5 ML SYRINGE
INTRAMUSCULAR | Status: DC | PRN
  Administered 2018-07-16: 100 mg via INTRAVENOUS

## 2018-07-16 MED ORDER — PROPOFOL 10 MG/ML IV BOLUS
INTRAVENOUS | Status: DC | PRN
  Administered 2018-07-16: 150 mg via INTRAVENOUS

## 2018-07-16 MED ORDER — LIDOCAINE-EPINEPHRINE 1 %-1:100000 IJ SOLN
INTRAMUSCULAR | Status: DC | PRN
  Administered 2018-07-16: 5 mL

## 2018-07-16 MED ORDER — 0.9 % SODIUM CHLORIDE (POUR BTL) OPTIME
TOPICAL | Status: DC | PRN
  Administered 2018-07-16: 1000 mL

## 2018-07-16 MED ORDER — ONDANSETRON HCL 4 MG/2ML IJ SOLN
INTRAMUSCULAR | Status: AC
  Filled 2018-07-16: qty 2

## 2018-07-16 MED ORDER — SENNOSIDES-DOCUSATE SODIUM 8.6-50 MG PO TABS: 1.0000 | ORAL_TABLET | Freq: Every evening | ORAL | Status: DC | PRN

## 2018-07-16 MED ORDER — HYDROMORPHONE HCL 1 MG/ML IJ SOLN
0.5000 mg | INTRAMUSCULAR | Status: DC | PRN
  Administered 2018-07-16: 1 mg via INTRAVENOUS
  Filled 2018-07-16: qty 1

## 2018-07-16 MED ORDER — CEFAZOLIN SODIUM-DEXTROSE 2-4 GM/100ML-% IV SOLN
2.0000 g | INTRAVENOUS | Status: AC
  Administered 2018-07-16: 2 g via INTRAVENOUS

## 2018-07-16 MED ORDER — OXYCODONE HCL 5 MG/5ML PO SOLN: 5.0000 mg | Freq: Once | ORAL | Status: DC | PRN

## 2018-07-16 MED ORDER — OXYCODONE HCL 5 MG PO TABS: 5.0000 mg | ORAL_TABLET | ORAL | Status: DC | PRN

## 2018-07-16 MED ORDER — THROMBIN 5000 UNITS EX SOLR
OROMUCOSAL | Status: DC | PRN
  Administered 2018-07-16: 5 mL via TOPICAL
  Administered 2018-07-16: 5 mL

## 2018-07-16 SURGICAL SUPPLY — 81 items
BAG DECANTER FOR FLEXI CONT (MISCELLANEOUS) ×3 IMPLANT
BASKET BONE COLLECTION (BASKET) ×3 IMPLANT
BENZOIN TINCTURE PRP APPL 2/3 (GAUZE/BANDAGES/DRESSINGS) ×3 IMPLANT
BIT DRILL 3.5 POWEREASE (BIT) ×2 IMPLANT
BIT DRILL 3.5MM POWEREASE (BIT) ×1
BLADE CLIPPER SURG (BLADE) IMPLANT
BLADE SURG 11 STRL SS (BLADE) ×3 IMPLANT
BUR MATCHSTICK NEURO 3.0 LAGG (BURR) ×3 IMPLANT
BUR PRECISION FLUTE 5.0 (BURR) ×3 IMPLANT
CANISTER SUCT 3000ML PPV (MISCELLANEOUS) ×3 IMPLANT
CARTRIDGE OIL MAESTRO DRILL (MISCELLANEOUS) ×1 IMPLANT
CLOSURE WOUND 1/2 X4 (GAUZE/BANDAGES/DRESSINGS) ×1
CONT SPEC 4OZ CLIKSEAL STRL BL (MISCELLANEOUS) ×3 IMPLANT
COVER BACK TABLE 60X90IN (DRAPES) ×3 IMPLANT
DECANTER SPIKE VIAL GLASS SM (MISCELLANEOUS) ×3 IMPLANT
DERMABOND ADVANCED (GAUZE/BANDAGES/DRESSINGS) ×2
DERMABOND ADVANCED .7 DNX12 (GAUZE/BANDAGES/DRESSINGS) ×1 IMPLANT
DEVICE INTERBODY ELEVATE 23X8 (Cage) ×8 IMPLANT
DIFFUSER DRILL AIR PNEUMATIC (MISCELLANEOUS) ×3 IMPLANT
DRAPE C-ARM 42X72 X-RAY (DRAPES) ×3 IMPLANT
DRAPE C-ARMOR (DRAPES) ×3 IMPLANT
DRAPE LAPAROTOMY 100X72X124 (DRAPES) ×3 IMPLANT
DRAPE SURG 17X23 STRL (DRAPES) ×3 IMPLANT
DRSG OPSITE POSTOP 4X6 (GAUZE/BANDAGES/DRESSINGS) ×3 IMPLANT
DURAPREP 26ML APPLICATOR (WOUND CARE) ×3 IMPLANT
ELECT REM PT RETURN 9FT ADLT (ELECTROSURGICAL) ×3
ELECTRODE REM PT RTRN 9FT ADLT (ELECTROSURGICAL) ×1 IMPLANT
GAUZE 4X4 16PLY RFD (DISPOSABLE) IMPLANT
GAUZE SPONGE 4X4 12PLY STRL (GAUZE/BANDAGES/DRESSINGS) IMPLANT
GLOVE BIO SURGEON STRL SZ 6.5 (GLOVE) ×8 IMPLANT
GLOVE BIO SURGEON STRL SZ7.5 (GLOVE) ×9 IMPLANT
GLOVE BIO SURGEONS STRL SZ 6.5 (GLOVE) ×4
GLOVE BIOGEL PI IND STRL 6.5 (GLOVE) ×2 IMPLANT
GLOVE BIOGEL PI IND STRL 7.0 (GLOVE) ×4 IMPLANT
GLOVE BIOGEL PI IND STRL 7.5 (GLOVE) ×6 IMPLANT
GLOVE BIOGEL PI IND STRL 8 (GLOVE) ×4 IMPLANT
GLOVE BIOGEL PI INDICATOR 6.5 (GLOVE) ×4
GLOVE BIOGEL PI INDICATOR 7.0 (GLOVE) ×8
GLOVE BIOGEL PI INDICATOR 7.5 (GLOVE) ×12
GLOVE BIOGEL PI INDICATOR 8 (GLOVE) ×8
GLOVE ECLIPSE 7.0 STRL STRAW (GLOVE) ×9 IMPLANT
GOWN STRL REUS W/ TWL LRG LVL3 (GOWN DISPOSABLE) ×4 IMPLANT
GOWN STRL REUS W/ TWL XL LVL3 (GOWN DISPOSABLE) ×2 IMPLANT
GOWN STRL REUS W/TWL 2XL LVL3 (GOWN DISPOSABLE) IMPLANT
GOWN STRL REUS W/TWL LRG LVL3 (GOWN DISPOSABLE) ×8
GOWN STRL REUS W/TWL XL LVL3 (GOWN DISPOSABLE) ×4
GRAFT DURAGEN MATRIX 1WX1L (Tissue) ×3 IMPLANT
HEMOSTAT POWDER KIT SURGIFOAM (HEMOSTASIS) ×3 IMPLANT
KIT BASIN OR (CUSTOM PROCEDURE TRAY) ×3 IMPLANT
KIT INFUSE X SMALL 1.4CC (Orthopedic Implant) ×3 IMPLANT
KIT POSITION SURG JACKSON T1 (MISCELLANEOUS) ×3 IMPLANT
KIT TURNOVER KIT B (KITS) ×3 IMPLANT
MILL MEDIUM DISP (BLADE) ×3 IMPLANT
NEEDLE HYPO 18GX1.5 BLUNT FILL (NEEDLE) IMPLANT
NEEDLE HYPO 22GX1.5 SAFETY (NEEDLE) ×3 IMPLANT
NEEDLE SPNL 18GX3.5 QUINCKE PK (NEEDLE) ×3 IMPLANT
NS IRRIG 1000ML POUR BTL (IV SOLUTION) ×3 IMPLANT
OIL CARTRIDGE MAESTRO DRILL (MISCELLANEOUS) ×3
PACK LAMINECTOMY NEURO (CUSTOM PROCEDURE TRAY) ×3 IMPLANT
PAD ARMBOARD 7.5X6 YLW CONV (MISCELLANEOUS) ×9 IMPLANT
PASTE BONE GRAFTON 5CC (Bone Implant) ×3 IMPLANT
ROD SOLERA 70MM (Rod) ×4 IMPLANT
ROD SOLERA 70X4.75X (Rod) ×2 IMPLANT
SCREW 5.5X35MM (Screw) ×12 IMPLANT
SCREW BN 35X5.5XMA NS SPNE (Screw) ×6 IMPLANT
SCREW SET SOLERA (Screw) ×12 IMPLANT
SCREW SET SOLERA TI (Screw) ×6 IMPLANT
SPACER SPNL XLORDOTIC 23X8X (Cage) ×4 IMPLANT
SPCR SPNL XLORDOTIC 23X8X (Cage) ×4 IMPLANT
SPONGE LAP 4X18 RFD (DISPOSABLE) ×3 IMPLANT
SPONGE SURGIFOAM ABS GEL 100 (HEMOSTASIS) IMPLANT
STRIP CLOSURE SKIN 1/2X4 (GAUZE/BANDAGES/DRESSINGS) ×2 IMPLANT
SUT VIC AB 0 CT1 18XCR BRD8 (SUTURE) ×2 IMPLANT
SUT VIC AB 0 CT1 8-18 (SUTURE) ×4
SUT VIC AB 3-0 FS2 27 (SUTURE) IMPLANT
SUT VICRYL 3-0 RB1 18 ABS (SUTURE) ×9 IMPLANT
SYR 3ML LL SCALE MARK (SYRINGE) ×12 IMPLANT
TOWEL GREEN STERILE (TOWEL DISPOSABLE) ×3 IMPLANT
TOWEL GREEN STERILE FF (TOWEL DISPOSABLE) ×3 IMPLANT
TRAY FOLEY MTR SLVR 16FR STAT (SET/KITS/TRAYS/PACK) IMPLANT
WATER STERILE IRR 1000ML POUR (IV SOLUTION) ×3 IMPLANT

## 2018-07-16 NOTE — H&P (Signed)
Chief Complaint   Leg pain  HPI   HPI: Chase Lloyd is a 62 y.o. male with primary complaint of bilateral lower extremity pain with ambulation. Pain has been going on for almost two years with gradual, progressive worsening. He has significant difficulties ambulating short distances due to heaviness in his legs. He has minimal to no pain when sitting or lying flat. An MRI of her lumbar spine was ordered and significant for lumbar spinal stenosis. He has failed conservative treatment and presents today for surgery. He is without any concerns.  Patient Active Problem List   Diagnosis Date Noted  . GERD (gastroesophageal reflux disease) 04/26/2018  . Intermittent claudication ([place]) 06/12/2017  . Lumbar spondylosis 06/12/2017  . COPD GOLD III 05/29/2017  . Tendinopathy of left gluteus medius 05/01/2017  . Cigarette smoker 05/01/2017    PMH: Past Medical History:  Diagnosis Date  . Back pain   . COPD (chronic obstructive pulmonary disease) ([place])   . DDD (degenerative disc disease), lumbar   . Depression   . Emphysema of lung ([place])   . Empyema ([place])    2016  (was in [place])  . GERD (gastroesophageal reflux disease)   . Hearing loss of right ear    started 2001.     microphone in right ear ,  and hearing aid in left  . Hodgkin's disease ([place]) 1999  . Hypotension   . Meniere's disease   . Pneumonia   . Tinnitus     PSH: Past Surgical History:  Procedure Laterality Date  . BACK SURGERY    . CERVICAL FUSION  2016  . ELBOW SURGERY    . EYE SURGERY    . LAMINECTOMY  1999  . lower back surgery    . TONSILLECTOMY      No medications prior to admission.    SH: Social History   Tobacco Use  . Smoking status: Former Smoker    Packs/day: 1.50    Years: 47.00    Pack years: 70.50    Types: Cigarettes    Last attempt to quit: 06/13/2018    Years since quitting: 0.0  . Smokeless tobacco: Never Used  Substance Use Topics  . Alcohol use: Yes    Comment: 24 cans in a  week  . Drug use: No    MEDS: Prior to Admission medications   Medication Sig Start Date End Date Taking? Authorizing Provider  albuterol (PROVENTIL HFA;VENTOLIN HFA) 108 (90 Base) MCG/ACT inhaler Inhale 2 puffs into the lungs every 6 (six) hours as needed for wheezing or shortness of breath. 04/28/18  Yes Tanda Rockers, MD  buPROPion (WELLBUTRIN SR) 150 MG 12 hr tablet Take 1 tablet (150 mg total) by mouth 2 (two) times daily. 04/26/18 07/25/18 Yes Nafziger, Tommi Rumps, NP  ibuprofen (ADVIL,MOTRIN) 800 MG tablet Take 800 mg by mouth every 8 (eight) hours as needed (for pain.).    Yes [provider]  omeprazole (PRILOSEC) 40 MG capsule Take 1 capsule (40 mg total) by mouth daily. 04/26/18  Yes Nafziger, Tommi Rumps, NP  SYMBICORT 160-4.5 MCG/ACT inhaler Inhale 2 puffs into the lungs 2 (two) times daily. 05/10/18  Yes Tanda Rockers, MD  doxycycline (VIBRA-TABS) 100 MG tablet Take 1 tablet (100 mg total) by mouth 2 (two) times daily. Patient not taking: Reported on 04/28/2018 04/28/18   [name], NP  predniSONE (DELTASONE) 10 MG tablet 40 mg x 3 days, 20 mg x 3 days, 10 mg x 3 days Patient not  taking: Reported on 07/08/2018 04/26/18   [name], NP  Tiotropium Bromide Monohydrate (SPIRIVA RESPIMAT) 2.5 MCG/ACT AERS Inhale 2 puffs into the lungs daily. Patient not taking: Reported on 04/28/2018 06/19/17   Tanda Rockers, MD    ALLERGY: No Known Allergies  Social History   Tobacco Use  . Smoking status: Former Smoker    Packs/day: 1.50    Years: 47.00    Pack years: 70.50    Types: Cigarettes    Last attempt to quit: 06/13/2018    Years since quitting: 0.0  . Smokeless tobacco: Never Used  Substance Use Topics  . Alcohol use: Yes    Comment: 24 cans in a week     Family History  Problem Relation Age of Onset  . Hypertension Father   . Diabetes Father   . Testicular cancer Brother   . Melanoma Brother      ROS   ROS  Exam   There were no vitals filed for this  visit. General appearance: WDWN, NAD Eyes: PERRL, Fundoscopic: normal Cardiovascular: Regular rate and rhythm without murmurs, rubs, gallops. No edema or variciosities. Distal pulses normal. Pulmonary: Clear to auscultation Musculoskeletal:     Muscle tone upper extremities: Normal    Muscle tone lower extremities: Normal    Motor exam: Upper Extremities Deltoid Bicep Tricep Grip  Right 5/5 5/5 5/5 5/5  Left 5/5 5/5 5/5 5/5   Lower Extremity IP Quad PF DF EHL  Right 5/5 5/5 5/5 5/5 5/5  Left 5/5 5/5 5/5 5/5 5/5   Neurological Awake, alert, oriented Memory and concentration grossly intact Speech fluent, appropriate CNII: Visual fields normal CNIII/IV/VI: EOMI CNV: Facial sensation normal CNVII: Symmetric, normal strength CNVIII: Grossly normal CNIX: Normal palate movement CNXI: Trap and SCM strength normal CN XII: Tongue protrusion normal Sensation grossly intact to LT DTR: Normal Coordination (finger/nose & heel/shin): Normal  Results - Imaging/Labs   Results for orders placed or performed during the hospital encounter of 07/14/18 (from the past 48 hour(s))  Surgical pcr screen     Status: None   Collection Time: 07/14/18  2:29 PM  Result Value Ref Range   MRSA, PCR NEGATIVE NEGATIVE   Staphylococcus aureus NEGATIVE NEGATIVE    Comment: (NOTE) The Xpert SA Assay (FDA approved for NASAL specimens in patients 30 years of age and older), is one component of a comprehensive surveillance program. It is not intended to diagnose infection nor to guide or monitor treatment. Performed at [facility], [place] [address] [place], [place] [address]   CBC     Status: Abnormal   Collection Time: 07/14/18  2:30 PM  Result Value Ref Range   WBC 5.9 4.0 - 10.5 K/uL   RBC 4.26 4.22 - 5.81 MIL/uL   Hemoglobin 13.8 13.0 - 17.0 g/dL   HCT 42.7 39.0 - 52.0 %   MCV 100.2 (H) 78.0 - 100.0 fL   MCH 32.4 26.0 - 34.0 pg   MCHC 32.3 30.0 - 36.0 g/dL   RDW 12.7 11.5 - 15.5 %    Platelets 152 150 - 400 K/uL    Comment: Performed at [facility] [address], [place],  [address]  Basic metabolic panel     Status: None   Collection Time: 07/14/18  2:30 PM  Result Value Ref Range   Sodium 139 135 - 145 mmol/L   Potassium 3.5 3.5 - 5.1 mmol/L   Chloride 103 98 - 111 mmol/L   CO2  26 22 - 32 mmol/L   Glucose, Bld 99 70 - 99 mg/dL   BUN 11 8 - 23 mg/dL   Creatinine, Ser 1.18 0.61 - 1.24 mg/dL   Calcium 9.4 8.9 - 10.3 mg/dL   GFR calc non Af Amer >60 >60 mL/min   GFR calc Af Amer >60 >60 mL/min    Comment: (NOTE) The eGFR has been calculated using the CKD EPI equation. This calculation has not been validated in all clinical situations. eGFR's persistently <60 mL/min signify possible Chronic Kidney Disease.    Anion gap 10 5 - 15    Comment: Performed at [facility] [address] [place], [place] [address]  Type and screen     Status: None   Collection Time: 07/14/18  2:57 PM  Result Value Ref Range   ABO/RH(D) O POS    Antibody Screen NEG    Sample Expiration 07/28/2018    Extend sample reason      NO TRANSFUSIONS OR PREGNANCY IN THE PAST 3 MONTHS Performed at [facility], [place] [address] [place], [place] [address]   ABO/Rh     Status: None   Collection Time: 07/14/18  2:57 PM  Result Value Ref Range   ABO/RH(D)      O POS Performed at [facility] [address], [place], [place] [address]     Dg Chest 2 View  Result Date: 07/15/2018 CLINICAL DATA:  Pre-op for lumbar surgery on Friday 9/13. Hx smoking but quit 1 month ago per pt. No chest complaints other than chronic cough but pt states he sees a pulmonologist for that. Denies any other chest symptoms. Hx COPD. Denies further chest hx. EXAM: CHEST - 2 VIEW COMPARISON:  04/26/2018 FINDINGS: The lungs are hyperinflated. Emphysematous changes are present primarily in the RIGHT UPPER lobe. There is perihilar peribronchial thickening. More focal opacity at the RIGHT  lung base is consistent with scarring or atelectasis. No pulmonary edema. No focal consolidations or pleural effusions. Mild midthoracic spondylosis. IMPRESSION: 1. Hyperinflation and bronchitic changes with emphysema. 2. Stable scarring in the LATERAL RIGHT LOWER base. 3.  No evidence for acute  abnormality. Electronically Signed   By: Nolon Nations M.D.   On: 07/15/2018 08:44    IMAGING: MRI of the lumbar spine dated 07/09/2017 was again reviewed.  This again demonstrates primary finding at L3-4 and L4-5 where there is retrolisthesis, bilateral facet arthropathy, and resultant primarily lateral recess stenosis.  There is also significant disc degeneration with loss of height at L5-S1.  Impression/Plan   62 y.o. male with bilateral leg pain likely related to lateral recess stenosis with spondylolisthesis with at L3-4 and L4-5.  He has had progression of his pain despite reasonable conservative treatments including medical therapy, physical therapy, and injection therapy.  It was recommended that he undergo surgical decompression and fusion at L3-4 and L4-5 with interbody biomechanical device placement, interbody arthrodesis, and posterior segmental instrumentation from L3-L5 with cortical pedicle screws.  While in the office he risks which include but are not limited to nerve root injury leading to leg or foot weakness/numbness and/or bowel and bladder dysfunction, CSF leak, bleeding, and infection.  Possible outcomes of surgery were also discussed including the possibility of persistence or worsening of pain symptoms and the possibility of accelerated adjacent level degeneration. The general risks of anesthesia were also reviewed including heart attack, stroke, and DVT/PE.    The patient understood our discussion and is willing to proceed  with surgical decompression and fusion.  All questions were answered.

## 2018-07-16 NOTE — Op Note (Signed)
NEUROSURGERY OPERATIVE NOTE   PREOP DIAGNOSIS:  1. Lumbago with radiculopathy 2. Spondylolisthesis, L3-4, L4-5  POSTOP DIAGNOSIS: Same  PROCEDURE: 1. L4, L5 laminectomy with facetectomy for decompression of exiting nerve roots, more than would be required for placement of interbody graft 2. Placement of anterior interbody device - [employer] expandable 71mm 12deg lordotic cage x4 3. Posterior segmental instrumentation using cortical pedicle screws at L3 - L5 4. Interbody arthrodesis, L3-4, L4-5 5. Use of locally harvested bone autograft 6. Use of non-structural bone allograft - rhBMP-2 (InFuse)  SURGEON: Dr. Consuella Lose, MD  ASSISTANT: Dr. Emelda Brothers, MD  ANESTHESIA: General Endotracheal  EBL: 300cc  SPECIMENS: None  DRAINS: None  COMPLICATIONS: None immediate  CONDITION: Hemodynamically stable to PACU  HISTORY: Chase Lloyd is a 62 y.o. man who has been followed in the outpatient clinic with back and leg pain related to spondylosis with spondylolisthesis at L3-4 and L4-5. He attempted multiple conservative treatments and ultimately elected to proceed with surgical decompression and fusion. Risks and benefits were reviewed and consent was obtained.  PROCEDURE IN DETAIL: After informed consent was obtained and witnessed, the patient was brought to the operating room. After induction of general anesthesia, the patient was positioned on the operative table in the prone position. All pressure points were meticulously padded. The previous Incision was then marked out and prepped and draped in the usual sterile fashion.  After timeout was conducted, skin was infiltrated with local anesthetic. Skin incision was then made sharply and Bovie electrocautery was used to dissect the subcutaneous tissue until the lumbodorsal fascia was identified and incised. The muscle was then elevated in the subperiosteal plane and the lamina at L4 and L5 were identified and dissected out  laterally until the pars was identified at both levels.  The intervening L4-5 facet complex was also identified.  Self-retaining retractor was then placed.  At this point attention was turned to decompression. Complete L4 and L5 laminectomy with facetectomy was completed using a combination of Kerrison rongeurs and a high-speed drill.  There did not appear to be any significant amount of central stenosis or ligamentous hypertrophy, however the facet complexes were somewhat hypertrophied, and did appear to cause a fair bit of lateral recess stenosis at both levels.  Of note, the patient's prior surgery on the right at L4-5 did cause a significant amount of epidural fibrosis along the ventral epidural space and along the lateral aspect of the right L5 nerve root.  Once decompression was completed, I was able to easily pass a ball-tipped dissector in the ventral epidural space along the nerve root and out each individual foramen.  Disc spaces at L3-4 and L4-5 was then identified.  At both levels, there were some posterior osteophytes which prevented easy access to the disc space.  At both levels, initially a #6 shaver was tapped into the disc space.  This allowed removal of the posterior osteophytes in combination with Kerrison Rogers.  The remainder of the discectomy were then completed at both levels using a combination of pituitary Rogers, shavers, and curettes.  Once the discectomy was completed, sharp curettes were used to remove cartilaginous endplate and prepare the interspace for fusion.  Bone harvested during the decompression portion of the procedure was cleaned of any soft tissue and morselized.  This was then packed into the interspaces at L3-4 and L4-5.  BMP was also placed in the interspaces.  A 8 mm trial was selected.  The above expandable cages were then packed  with some bone autograft and tapped into place.  This was done at both levels bilaterally.  Cages position was confirmed with lateral  fluoroscopy.  They were then expanded fully to 12 mm.  At this point, attention was turned to placement of the cortical pedicle screws.  Initially, lateral fluoroscopy was used to identify entry points in conjunction with directly visualized anatomic landmarks.  Entry points were then created for cortical pedicle screws bilaterally at L3, L4, and L5.  Cortical pedicle screws were drilled and tapped to 5.5 mm. Screws were then placed in all 3 levels from L3-L5.  Pre-bent lordotic rod was then placed, set screws placed and final tightened. Final AP and lateral fluoroscopic images confirmed good position.  The wound was then irrigated with copious amounts of antibiotic saline, then closed in standard fashion using a combination of interrupted 0 and 3-0 Vicryl stitches in the muscular, fascial, and subcutaneous layers. Skin was then closed using standard Dermabond. Sterile dressing was then applied. The patient was then transferred to the stretcher, extubated, and taken to the postanesthesia care unit in stable hemodynamic condition.  At the end of the case all sponge, needle, cottonoid, and instrument counts were correct.

## 2018-07-16 NOTE — Transfer of Care (Signed)
Immediate Anesthesia Transfer of Care Note  Patient: Chase Lloyd  Procedure(s) Performed: POSTERIOR LUMBAR INTERBODY FUSION LUMBAR THREE- LUMBAR FOUR, LUMBAR FOUR- LUMBAR FIVE, INTERBODY DEVICE,INTERBODY ARTHRODESIS, POSTERIOR SEGMENTAL INSTRUMENTATION LUMBAR THREE- LUMBAR FIVE (N/A Spine Lumbar)  Patient Location: PACU  Anesthesia Type:General  Level of Consciousness: awake, alert  and patient cooperative  Airway & Oxygen Therapy: Patient Spontanous Breathing  Post-op Assessment: Report given to RN and Post -op Vital signs reviewed and stable  Post vital signs: Reviewed and stable  Last Vitals:  Vitals Value Taken Time  BP 142/65 07/16/2018  6:27 PM  Temp    Pulse 100 07/16/2018  6:30 PM  Resp 12 07/16/2018  6:30 PM  SpO2 88 % 07/16/2018  6:30 PM  Vitals shown include unvalidated device data.  Last Pain:  Vitals:   07/16/18 1029  TempSrc: Oral         Complications: No apparent anesthesia complications

## 2018-07-16 NOTE — Brief Op Note (Signed)
07/16/2018  5:51 PM  PATIENT:  Chase Lloyd  62 y.o. male  PRE-OPERATIVE DIAGNOSIS:  LUMBAR STENOSIS WITH NEUROGENIC CLAUDICATION  POST-OPERATIVE DIAGNOSIS:  LUMBAR STENOSIS WITH NEUROGENIC CLAUDICATION  PROCEDURE:  Procedure(s): POSTERIOR LUMBAR INTERBODY FUSION LUMBAR THREE- LUMBAR FOUR, LUMBAR FOUR- LUMBAR FIVE, INTERBODY DEVICE,INTERBODY ARTHRODESIS, POSTERIOR SEGMENTAL INSTRUMENTATION LUMBAR THREE- LUMBAR FIVE (N/A)  SURGEON:  Surgeon(s) and Role:    * Consuella Lose, MD - Primary    * Ostergard, Joyice Faster, MD - Assisting  PHYSICIAN ASSISTANT: Ferne Reus, PA-C  ANESTHESIA:   general  EBL:  300 mL   BLOOD ADMINISTERED:none  DRAINS: none   LOCAL MEDICATIONS USED:  MARCAINE    and XYLOCAINE   SPECIMEN:  No Specimen  DISPOSITION OF SPECIMEN:  N/A  COUNTS:  YES  TOURNIQUET:  * No tourniquets in log *  DICTATION: .Note written in EPIC  PLAN OF CARE: Admit to inpatient   PATIENT DISPOSITION:  PACU - hemodynamically stable.   Delay start of Pharmacological VTE agent (>24hrs) due to surgical blood loss or risk of bleeding: yes

## 2018-07-16 NOTE — Anesthesia Postprocedure Evaluation (Signed)
Anesthesia Post Note  Patient: Chase Lloyd  Procedure(s) Performed: POSTERIOR LUMBAR INTERBODY FUSION LUMBAR THREE- LUMBAR FOUR, LUMBAR FOUR- LUMBAR FIVE, INTERBODY DEVICE,INTERBODY ARTHRODESIS, POSTERIOR SEGMENTAL INSTRUMENTATION LUMBAR THREE- LUMBAR FIVE (N/A Spine Lumbar)     Patient location during evaluation: PACU Anesthesia Type: General Level of consciousness: awake and alert Pain management: pain level controlled Vital Signs Assessment: post-procedure vital signs reviewed and stable Respiratory status: spontaneous breathing, nonlabored ventilation, respiratory function stable and patient connected to nasal cannula oxygen Cardiovascular status: blood pressure returned to baseline and stable Postop Assessment: no apparent nausea or vomiting Anesthetic complications: no    Last Vitals:  Vitals:   07/16/18 1928 07/16/18 1945  BP: 126/80 (!) 143/58  Pulse: 100 (!) 103  Resp: 14 16  Temp: 36.6 C 36.6 C  SpO2: 100% 95%    Last Pain:  Vitals:   07/16/18 2050  TempSrc:   PainSc: 6                  [name]

## 2018-07-16 NOTE — Anesthesia Preprocedure Evaluation (Addendum)
Anesthesia Evaluation  Patient identified by MRN, date of birth, ID band Patient awake    Reviewed: Allergy & Precautions, NPO status , Patient's Chart, lab work & pertinent test results  Airway Mallampati: II  TM Distance: >3 FB Neck ROM: Full    Dental  (+) Edentulous Lower, Edentulous Upper   Pulmonary COPD,  COPD inhaler, former smoker,    breath sounds clear to auscultation + wheezing      Cardiovascular negative cardio ROS Normal cardiovascular exam Rhythm:Regular Rate:Normal  ECG: ST, rate 104   Neuro/Psych PSYCHIATRIC DISORDERS Depression Meniere's disease    GI/Hepatic Neg liver ROS, GERD  Medicated and Controlled,  Endo/Other  negative endocrine ROS  Renal/GU negative Renal ROS     Musculoskeletal negative musculoskeletal ROS (+)   Abdominal   Peds  Hematology negative hematology ROS (+)   Anesthesia Other Findings LUMBAR STENOSIS WITH NEUROGENIC CLAUDICATION  Reproductive/Obstetrics                            Anesthesia Physical Anesthesia Plan  ASA: II  Anesthesia Plan: General   Post-op Pain Management:    Induction: Intravenous  PONV Risk Score and Plan: 2 and Dexamethasone, Ondansetron, Treatment may vary due to age or medical condition and Midazolam  Airway Management Planned: Oral ETT  Additional Equipment:   Intra-op Plan:   Post-operative Plan: Extubation in OR  Informed Consent: I have reviewed the patients History and Physical, chart, labs and discussed the procedure including the risks, benefits and alternatives for the proposed anesthesia with the patient or authorized representative who has indicated his/her understanding and acceptance.   Dental advisory given  Plan Discussed with: CRNA  Anesthesia Plan Comments:         Anesthesia Quick Evaluation

## 2018-07-16 NOTE — Anesthesia Procedure Notes (Signed)
Procedure Name: Intubation Date/Time: 07/16/2018 2:11 PM Performed by: Genelle Bal, CRNA Pre-anesthesia Checklist: Patient identified, Emergency Drugs available, Suction available and Patient being monitored Patient Re-evaluated:Patient Re-evaluated prior to induction Oxygen Delivery Method: Circle system utilized Preoxygenation: Pre-oxygenation with 100% oxygen Induction Type: IV induction Ventilation: Mask ventilation without difficulty and Oral airway inserted - appropriate to patient size Laryngoscope Size: Sabra Heck and 2 Grade View: Grade I Tube type: Oral Tube size: 7.5 mm Number of attempts: 1 Airway Equipment and Method: Stylet and Oral airway Placement Confirmation: ETT inserted through vocal cords under direct vision,  positive ETCO2 and breath sounds checked- equal and bilateral Secured at: 23 cm Tube secured with: Tape Dental Injury: Teeth and Oropharynx as per pre-operative assessment

## 2018-07-17 ENCOUNTER — Other Ambulatory Visit: Payer: Self-pay

## 2018-07-17 LAB — CBC
HCT: 30.6 % — ABNORMAL LOW (ref 39.0–52.0)
Hemoglobin: 10 g/dL — ABNORMAL LOW (ref 13.0–17.0)
MCH: 32.8 pg (ref 26.0–34.0)
MCHC: 32.7 g/dL (ref 30.0–36.0)
MCV: 100.3 fL — ABNORMAL HIGH (ref 78.0–100.0)
Platelets: 120 10*3/uL — ABNORMAL LOW (ref 150–400)
RBC: 3.05 MIL/uL — ABNORMAL LOW (ref 4.22–5.81)
RDW: 12.8 % (ref 11.5–15.5)
WBC: 6.3 10*3/uL (ref 4.0–10.5)

## 2018-07-17 LAB — PROTIME-INR
INR: 1.08
Prothrombin Time: 13.9 s (ref 11.4–15.2)

## 2018-07-17 LAB — APTT: aPTT: 30 s (ref 24–36)

## 2018-07-17 LAB — BASIC METABOLIC PANEL
Anion gap: 9 (ref 5–15)
BUN: 11 mg/dL (ref 8–23)
CO2: 28 mmol/L (ref 22–32)
Calcium: 8.4 mg/dL — ABNORMAL LOW (ref 8.9–10.3)
Chloride: 98 mmol/L (ref 98–111)
Creatinine, Ser: 1.29 mg/dL — ABNORMAL HIGH (ref 0.61–1.24)
GFR calc Af Amer: >60 mL/min (ref >60)
GFR calc non Af Amer: 57 mL/min — ABNORMAL LOW (ref >60)
Glucose, Bld: 157 mg/dL — ABNORMAL HIGH (ref 70–99)
Potassium: 3.7 mmol/L (ref 3.5–5.1)
Sodium: 135 mmol/L (ref 135–145)

## 2018-07-17 MED ORDER — OXYCODONE HCL 5 MG PO TABS: 5.0000 mg | ORAL_TABLET | Freq: Four times a day (QID) | ORAL | 0 refills | Status: DC | PRN

## 2018-07-17 MED ORDER — METHOCARBAMOL 750 MG PO TABS: 750.0000 mg | ORAL_TABLET | Freq: Four times a day (QID) | ORAL | 0 refills | Status: DC

## 2018-07-17 NOTE — Evaluation (Signed)
Physical Therapy Evaluation Patient Details Name: Chase Lloyd MRN: [id number] DOB: August 14, 1956 Today's Date: 07/17/2018   History of Present Illness  Mr Chase Lloyd is a 62 yo male s/p L3-5 fusion  Clinical Impression  Patient evaluated by Physical Therapy with no further acute PT needs identified. Patient ambulating hallway distances and negotiating steps without difficulty. Provided postural re-education and instructions for generalized walking program. All education has been completed and the patient has no further questions. No follow-up Physical Therapy or equipment needs. PT is signing off. Thank you for this referral.     Follow Up Recommendations No PT follow up    Equipment Recommendations  None recommended by PT    Recommendations for Other Services       Precautions / Restrictions Precautions Precautions: Back Precaution Booklet Issued: Yes (comment) Required Braces or Orthoses: Spinal Brace Spinal Brace: Lumbar corset;Applied in sitting position;Applied in standing position Restrictions Weight Bearing Restrictions: No      Mobility  Bed Mobility               General bed mobility comments: OOB in chair  Transfers Overall transfer level: Independent                  Ambulation/Gait Ambulation/Gait assistance: Modified independent (Device/Increase time) Gait Distance (Feet): 400 Feet Assistive device: None Gait Pattern/deviations: WFL(Within Functional Limits)     General Gait Details: kyphoic posture throughout gait (patient's baseline). cues for scapular activation pulling into retraction  Stairs Stairs: Yes Stairs assistance: Modified independent (Device/Increase time) Stair Management: One rail Right Number of Stairs: 10 General stair comments: step over step pattern  Wheelchair Mobility    Modified Rankin (Stroke Patients Only)       Balance Overall balance assessment: No apparent balance deficits (not formally assessed)                                           Pertinent Vitals/Pain Pain Assessment: 0-10 Pain Score: 3  Pain Location: incisonal Pain Descriptors / Indicators: Sore;Operative site guarding Pain Intervention(s): Limited activity within patient's tolerance;Monitored during session;Repositioned    Home Living Family/patient expects to be discharged to:: Private residence Living Arrangements: Spouse/significant other;Children Available Help at Discharge: Family;Available PRN/intermittently Type of Home: House Home Access: Stairs to enter Entrance Stairs-Rails: None Entrance Stairs-Number of Steps: 4 Home Layout: One level Home Equipment: None Additional Comments: wife works part time days, son with mental disabilites can A with some things (but son does work part time)    Prior Function Level of Independence: Independent               Hand Dominance   Dominant Hand: Right    Extremity/Trunk Assessment   Upper Extremity Assessment Upper Extremity Assessment: Overall WFL for tasks assessed    Lower Extremity Assessment Lower Extremity Assessment: Overall WFL for tasks assessed    Cervical / Trunk Assessment Cervical / Trunk Assessment: Kyphotic  Communication   Communication: No difficulties  Cognition Arousal/Alertness: Awake/alert Behavior During Therapy: WFL for tasks assessed/performed Overall Cognitive Status: Within Functional Limits for tasks assessed                                        General Comments      Exercises     Assessment/Plan  PT Assessment Patent does not need any further PT services  PT Problem List         PT Treatment Interventions      PT Goals (Current goals can be found in the Care Plan section)  Acute Rehab PT Goals Patient Stated Goal: to go home    Frequency     Barriers to discharge        Co-evaluation               AM-PAC PT "6 Clicks" Daily Activity  Outcome Measure  Difficulty turning over in bed (including adjusting bedclothes, sheets and blankets)?: None Difficulty moving from lying on back to sitting on the side of the bed? : None Difficulty sitting down on and standing up from a chair with arms (e.g., wheelchair, bedside commode, etc,.)?: None Help needed moving to and from a bed to chair (including a wheelchair)?: None Help needed walking in hospital room?: None Help needed climbing 3-5 steps with a railing? : None 6 Click Score: 24    End of Session Equipment Utilized During Treatment: Gait belt;Back brace Activity Tolerance: Patient tolerated treatment well Patient left: in chair;with call bell/phone within reach Nurse Communication: Mobility status PT Visit Diagnosis: Other abnormalities of gait and mobility (R26.89);Pain Pain - part of body: (back)    Time: 0810-0829 PT Time Calculation (min) (ACUTE ONLY): 19 min   Charges:   PT Evaluation $PT Eval Low Complexity: [place], PT, DPT Acute Rehabilitation Services Pager [phone] Office [phone]  Willy Eddy 07/17/2018, 12:52 PM

## 2018-07-17 NOTE — Progress Notes (Signed)
Patient is discharged from room 3C07 at this time. Alert and in stable condition. IV site d/c'd and instructions read to patient with understanding verbalized. Left unit via wheelchair with all belongings at side. 

## 2018-07-17 NOTE — Evaluation (Signed)
Occupational Therapy Evaluation and Discharge Patient Details Name: Chase Lloyd MRN: [id number] DOB: 1956/05/28 Today's Date: 07/17/2018    History of Present Illness Mr Chase Lloyd is a 62 yo male s/p L3-5 fusion   Clinical Impression   This 62 yo male admitted and underwent above presents to acute OT with all education completed, we will D/C from acute OT.    Follow Up Recommendations  No OT follow up;Supervision - Intermittent    Equipment Recommendations  3 in 1 bedside commode       Precautions / Restrictions Precautions Precautions: Back Precaution Booklet Issued: Yes (comment) Required Braces or Orthoses: Spinal Brace Spinal Brace: Lumbar corset;Applied in sitting position;Applied in standing position Restrictions Weight Bearing Restrictions: No      Mobility Bed Mobility Overal bed mobility: Modified Independent                Transfers Overall transfer level: Modified independent Equipment used: None                      ADL either performed or assessed with clinical judgement   ADL                                         General ADL Comments: Educated pt on use of two cups for mouth are, use of wet wipes for back peri care post bowel movement, sequence of dressing, where items should be positioned for him to have easy access to,  not sitting for more than 20-30 minutes at a time (then get up and move around, then can sit for another 20-30 mintues). Pt reports wife can A him prn before or after work for [employer] Patient Visual Report: No change from baseline              Pertinent Vitals/Pain Pain Assessment: 0-10 Pain Score: 3  Pain Location: incisonal Pain Descriptors / Indicators: Sore;Operative site guarding Pain Intervention(s): Limited activity within patient's tolerance;Monitored during session;Repositioned     Hand Dominance Right   Extremity/Trunk Assessment Upper Extremity Assessment Upper  Extremity Assessment: Overall WFL for tasks assessed           Communication Communication Communication: No difficulties   Cognition Arousal/Alertness: Awake/alert Behavior During Therapy: WFL for tasks assessed/performed Overall Cognitive Status: Within Functional Limits for tasks assessed                                                Home Living Family/patient expects to be discharged to:: Private residence Living Arrangements: Spouse/significant other;Children Available Help at Discharge: Family;Available PRN/intermittently Type of Home: House Home Access: Stairs to enter [employer] of Steps: 4 Entrance Stairs-Rails: None Home Layout: One level     Bathroom Shower/Tub: Occupational psychologist: Standard     Home Equipment: None   Additional Comments: wife works part time days, son with mental disabilites can A with some things (but son does work part time)      Prior Functioning/Environment Level of Independence: Independent                 OT Problem List: Decreased range of motion;Pain         OT Goals(Current goals can  be found in the care plan section) Acute Rehab OT Goals Patient Stated Goal: to go home  OT Frequency:                AM-PAC PT "6 Clicks" Daily Activity     Outcome Measure Help from another person eating meals?: None Help from another person taking care of personal grooming?: None Help from another person toileting, which includes using toliet, bedpan, or urinal?: None Help from another person bathing (including washing, rinsing, drying)?: A Little Help from another person to put on and taking off regular upper body clothing?: None Help from another person to put on and taking off regular lower body clothing?: A Little 6 Click Score: 22   End of Session Equipment Utilized During Treatment: Back brace Nurse Communication: (pt needs 3n1)  Activity Tolerance: Patient tolerated treatment  well Patient left: in chair;with call bell/phone within reach  OT Visit Diagnosis: Pain Pain - part of body: (back)                Time: 7824-2353 OT Time Calculation (min): 26 min Charges:  OT General Charges $OT Visit: 1 Visit OT Evaluation $OT Eval Moderate Complexity: 1 Mod OT Treatments $Self Care/Home Management : 8-22 mins  [address], OTR/L Acute [employer] Pager [phone] Office [phone]

## 2018-07-17 NOTE — Discharge Instructions (Signed)
Wound Care Leave incision open to air. You may shower. Do not scrub directly on incision.  Do not put any creams, lotions, or ointments on incision. Activity Walk each and every day, increasing distance each day. No lifting greater than 5 lbs.  Avoid bending, arching, and twisting. No driving for 2 weeks; may ride as a passenger locally. If provided with back brace, wear when out of bed.  It is not necessary to wear in bed. Diet Resume your normal diet.  Return to Work Will be discussed at you follow up appointment. Call Your Doctor If Any of These Occur Redness, drainage, or swelling at the wound.  Temperature greater than 101 degrees. Severe pain not relieved by pain medication. Incision starts to come apart. Follow Up Appt Call for appointment  [phone]) or for problems.  If you have any hardware placed in your spine, you will need an x-ray before your appointment.

## 2018-07-17 NOTE — Discharge Summary (Signed)
  Physician Discharge Summary  Patient ID: Chase Lloyd MRN: [id number] DOB/AGE: [birth year] 62 y.o.  Admit date: 07/16/2018 Discharge date: 07/17/2018  Admission Diagnoses:lumbar spinal stenosis  Discharge Diagnoses:  Active Problems:   Lumbar spinal stenosis   Discharged Condition: good  Hospital Course: Chase Lloyd was admitted and taken to the operating room for an uncomplicated lumbar decompression and posterior arthrodesis. Post op he is ambulating, voiding, and tolerating a regular diet. His wound is clean, dry, and without signs of infection.   Treatments: surgery: 1. L4, L5 laminectomy with facetectomy for decompression of exiting nerve roots, more than would be required for placement of interbody graft 2. Placement of anterior interbody device - [employer] expandable 2mm 12deg lordotic cage x4 3. Posterior segmental instrumentation using cortical pedicle screws at L3 - L5 4. Interbody arthrodesis, L3-4, L4-5 5. Use of locally harvested bone autograft 6. Use of non-structural bone allograft - rhBMP-2 (InFuse)   Discharge Exam: Blood pressure 122/65, pulse (!) 101, temperature 97.7 F (36.5 C), temperature source Oral, resp. rate 18, height 5\' 10"  (1.778 m), weight 77.4 kg, SpO2 96 %. General appearance: alert, cooperative, appears stated age and mild distress Neurologic: Alert and oriented X 3, normal strength and tone. Normal symmetric reflexes. Normal coordination and gait  Disposition:  LUMBAR STENOSIS WITH NEUROGENIC CLAUDICATION  Allergies as of 07/17/2018   No Known Allergies     Medication List    STOP taking these medications   doxycycline 100 MG tablet Commonly known as:  VIBRA-TABS   predniSONE 10 MG tablet Commonly known as:  DELTASONE     TAKE these medications   albuterol 108 (90 Base) MCG/ACT inhaler Commonly known as:  PROVENTIL HFA;VENTOLIN HFA Inhale 2 puffs into the lungs every 6 (six) hours as needed for wheezing or shortness of breath.    buPROPion 150 MG 12 hr tablet Commonly known as:  WELLBUTRIN SR Take 1 tablet (150 mg total) by mouth 2 (two) times daily.   ibuprofen 800 MG tablet Commonly known as:  ADVIL,MOTRIN Take 800 mg by mouth every 8 (eight) hours as needed (for pain.).   methocarbamol 750 MG tablet Commonly known as:  ROBAXIN Take 1 tablet (750 mg total) by mouth 4 (four) times daily.   omeprazole 40 MG capsule Commonly known as:  PRILOSEC Take 1 capsule (40 mg total) by mouth daily.   oxyCODONE 5 MG immediate release tablet Commonly known as:  Oxy IR/ROXICODONE Take 1 tablet (5 mg total) by mouth every 6 (six) hours as needed for severe pain.   SYMBICORT 160-4.5 MCG/ACT inhaler Generic drug:  budesonide-formoterol Inhale 2 puffs into the lungs 2 (two) times daily.   Tiotropium Bromide Monohydrate 2.5 MCG/ACT Aers Inhale 2 puffs into the lungs daily.            Durable Medical Equipment  (From admission, onward)         Start     Ordered   07/17/18 0745  For home use only DME 3 n 1  Once     07/17/18 0744         Follow-up Information    Consuella Lose, MD Follow up.   Specialty:  Neurosurgery Contact information: [address] [place] [address] [phone]           Signed: Winfield Cunas 07/17/2018, 10:10 AM

## 2018-07-18 ENCOUNTER — Inpatient Hospital Stay (HOSPITAL_COMMUNITY): Payer: Medicare Other

## 2018-07-18 ENCOUNTER — Inpatient Hospital Stay (HOSPITAL_COMMUNITY)
Admission: EM | Admit: 2018-07-18 | Discharge: 2018-07-22 | DRG: 920 | Disposition: A | Discharge status: Home / Self Care | Payer: Medicare Other | Attending: Neurosurgery | Admitting: Neurosurgery

## 2018-07-18 ENCOUNTER — Encounter (HOSPITAL_COMMUNITY): Payer: Self-pay | Admitting: Emergency Medicine

## 2018-07-18 ENCOUNTER — Other Ambulatory Visit: Payer: Self-pay

## 2018-07-18 DIAGNOSIS — J449 Chronic obstructive pulmonary disease, unspecified: Secondary | ICD-10-CM | POA: Diagnosis present

## 2018-07-18 DIAGNOSIS — M5136 Other intervertebral disc degeneration, lumbar region: Secondary | ICD-10-CM | POA: Diagnosis present

## 2018-07-18 DIAGNOSIS — H8109 Meniere's disease, unspecified ear: Secondary | ICD-10-CM | POA: Diagnosis present

## 2018-07-18 DIAGNOSIS — M5126 Other intervertebral disc displacement, lumbar region: Secondary | ICD-10-CM | POA: Diagnosis not present

## 2018-07-18 DIAGNOSIS — M545 Low back pain, unspecified: Secondary | ICD-10-CM | POA: Diagnosis present

## 2018-07-18 DIAGNOSIS — G834 Cauda equina syndrome: Secondary | ICD-10-CM | POA: Diagnosis present

## 2018-07-18 DIAGNOSIS — S065X9A Traumatic subdural hemorrhage with loss of consciousness of unspecified duration, initial encounter: Secondary | ICD-10-CM | POA: Diagnosis present

## 2018-07-18 DIAGNOSIS — S065XAA Traumatic subdural hemorrhage with loss of consciousness status unknown, initial encounter: Secondary | ICD-10-CM | POA: Diagnosis present

## 2018-07-18 DIAGNOSIS — F329 Major depressive disorder, single episode, unspecified: Secondary | ICD-10-CM | POA: Diagnosis present

## 2018-07-18 DIAGNOSIS — Z981 Arthrodesis status: Secondary | ICD-10-CM | POA: Diagnosis not present

## 2018-07-18 DIAGNOSIS — H9191 Unspecified hearing loss, right ear: Secondary | ICD-10-CM | POA: Diagnosis present

## 2018-07-18 DIAGNOSIS — M96840 Postprocedural hematoma of a musculoskeletal structure following a musculoskeletal system procedure: Principal | ICD-10-CM | POA: Diagnosis present

## 2018-07-18 DIAGNOSIS — Z87891 Personal history of nicotine dependence: Secondary | ICD-10-CM

## 2018-07-18 DIAGNOSIS — R000 Tachycardia, unspecified: Secondary | ICD-10-CM | POA: Diagnosis not present

## 2018-07-18 DIAGNOSIS — K219 Gastro-esophageal reflux disease without esophagitis: Secondary | ICD-10-CM | POA: Diagnosis present

## 2018-07-18 LAB — CBC WITH DIFFERENTIAL/PLATELET
Abs Immature Granulocytes: 0 10*3/uL (ref 0.0–0.1)
BASOS ABS: 0 10*3/uL (ref 0.0–0.1)
BASOS PCT: 0 %
Eosinophils Absolute: 0 10*3/uL (ref 0.0–0.7)
Eosinophils Relative: 0 %
HCT: 29 % — ABNORMAL LOW (ref 39.0–52.0)
Hemoglobin: 9.2 g/dL — ABNORMAL LOW (ref 13.0–17.0)
IMMATURE GRANULOCYTES: 0 %
Lymphocytes Relative: 9 %
Lymphs Abs: 0.7 10*3/uL (ref 0.7–4.0)
MCH: 32.7 pg (ref 26.0–34.0)
MCHC: 31.7 g/dL (ref 30.0–36.0)
MCV: 103.2 fL — ABNORMAL HIGH (ref 78.0–100.0)
Monocytes Absolute: 0.6 10*3/uL (ref 0.1–1.0)
Monocytes Relative: 8 %
NEUTROS ABS: 6 10*3/uL (ref 1.7–7.7)
NEUTROS PCT: 83 %
PLATELETS: 93 10*3/uL — AB (ref 150–400)
RBC: 2.81 MIL/uL — ABNORMAL LOW (ref 4.22–5.81)
RDW: 13.2 % (ref 11.5–15.5)
WBC: 7.3 10*3/uL (ref 4.0–10.5)

## 2018-07-18 LAB — COMPREHENSIVE METABOLIC PANEL
ALT: 22 U/L (ref 0–44)
AST: 36 U/L (ref 15–41)
Albumin: 3.2 g/dL — ABNORMAL LOW (ref 3.5–5.0)
Alkaline Phosphatase: 48 U/L (ref 38–126)
Anion gap: 8 (ref 5–15)
BUN: 11 mg/dL (ref 8–23)
CHLORIDE: 99 mmol/L (ref 98–111)
CO2: 29 mmol/L (ref 22–32)
CREATININE: 1.27 mg/dL — AB (ref 0.61–1.24)
Calcium: 8.4 mg/dL — ABNORMAL LOW (ref 8.9–10.3)
GFR calc Af Amer: >60 mL/min (ref >60)
GFR, EST NON AFRICAN AMERICAN: 58 mL/min — AB (ref >60)
GLUCOSE: 109 mg/dL — AB (ref 70–99)
Potassium: 3.6 mmol/L (ref 3.5–5.1)
SODIUM: 136 mmol/L (ref 135–145)
Total Bilirubin: 2 mg/dL — ABNORMAL HIGH (ref 0.3–1.2)
Total Protein: 6.2 g/dL — ABNORMAL LOW (ref 6.5–8.1)

## 2018-07-18 LAB — MRSA PCR SCREENING: MRSA by PCR: NEGATIVE

## 2018-07-18 MED ORDER — BISACODYL 5 MG PO TBEC
10.0000 mg | DELAYED_RELEASE_TABLET | Freq: Once | ORAL | Status: AC
  Administered 2018-07-19: 10 mg via ORAL
  Filled 2018-07-18: qty 2

## 2018-07-18 MED ORDER — DIAZEPAM 5 MG PO TABS
5.0000 mg | ORAL_TABLET | Freq: Four times a day (QID) | ORAL | Status: DC | PRN
  Administered 2018-07-19 – 2018-07-20 (×5): 5 mg via ORAL
  Filled 2018-07-18 (×5): qty 1

## 2018-07-18 MED ORDER — MOMETASONE FURO-FORMOTEROL FUM 200-5 MCG/ACT IN AERO
2.0000 | INHALATION_SPRAY | Freq: Two times a day (BID) | RESPIRATORY_TRACT | Status: DC
  Administered 2018-07-18 – 2018-07-22 (×7): 2 via RESPIRATORY_TRACT
  Filled 2018-07-18: qty 8.8

## 2018-07-18 MED ORDER — ALBUTEROL SULFATE (2.5 MG/3ML) 0.083% IN NEBU: 2.5000 mg | INHALATION_SOLUTION | Freq: Four times a day (QID) | RESPIRATORY_TRACT | Status: DC | PRN

## 2018-07-18 MED ORDER — HYDROMORPHONE HCL 1 MG/ML IJ SOLN
0.5000 mg | INTRAMUSCULAR | Status: DC | PRN
  Administered 2018-07-18 – 2018-07-21 (×11): 0.5 mg via INTRAVENOUS
  Filled 2018-07-18 (×13): qty 1

## 2018-07-18 MED ORDER — ACETAMINOPHEN 650 MG RE SUPP: 650.0000 mg | Freq: Four times a day (QID) | RECTAL | Status: DC | PRN

## 2018-07-18 MED ORDER — SODIUM CHLORIDE 0.9% FLUSH
3.0000 mL | Freq: Two times a day (BID) | INTRAVENOUS | Status: DC
  Administered 2018-07-18 – 2018-07-22 (×6): 3 mL via INTRAVENOUS

## 2018-07-18 MED ORDER — PANTOPRAZOLE SODIUM 40 MG PO TBEC
40.0000 mg | DELAYED_RELEASE_TABLET | Freq: Every day | ORAL | Status: DC
  Administered 2018-07-19 – 2018-07-22 (×4): 40 mg via ORAL
  Filled 2018-07-18 (×4): qty 1

## 2018-07-18 MED ORDER — ONDANSETRON HCL 4 MG/2ML IJ SOLN
4.0000 mg | Freq: Four times a day (QID) | INTRAMUSCULAR | Status: DC | PRN
  Administered 2018-07-19: 4 mg via INTRAVENOUS
  Filled 2018-07-18: qty 2

## 2018-07-18 MED ORDER — POTASSIUM CHLORIDE IN NACL 20-0.9 MEQ/L-% IV SOLN
INTRAVENOUS | Status: DC
  Administered 2018-07-18 – 2018-07-22 (×6): via INTRAVENOUS
  Filled 2018-07-18 (×6): qty 1000

## 2018-07-18 MED ORDER — TIOTROPIUM BROMIDE MONOHYDRATE 2.5 MCG/ACT IN AERS: 2.0000 | INHALATION_SPRAY | Freq: Every day | RESPIRATORY_TRACT | Status: DC

## 2018-07-18 MED ORDER — SODIUM CHLORIDE 0.9% FLUSH: 3.0000 mL | INTRAVENOUS | Status: DC | PRN

## 2018-07-18 MED ORDER — ACETAMINOPHEN 325 MG PO TABS
650.0000 mg | ORAL_TABLET | Freq: Four times a day (QID) | ORAL | Status: DC | PRN
  Administered 2018-07-19: 650 mg via ORAL
  Filled 2018-07-18: qty 2

## 2018-07-18 MED ORDER — SODIUM CHLORIDE 0.9 % IV SOLN: 250.0000 mL | INTRAVENOUS | Status: DC | PRN

## 2018-07-18 MED ORDER — METHOCARBAMOL 500 MG PO TABS
750.0000 mg | ORAL_TABLET | Freq: Four times a day (QID) | ORAL | Status: DC
  Administered 2018-07-18: 750 mg via ORAL
  Filled 2018-07-18: qty 2

## 2018-07-18 MED ORDER — SODIUM CHLORIDE 0.9 % IV BOLUS
1000.0000 mL | Freq: Once | INTRAVENOUS | Status: AC
  Administered 2018-07-18: 1000 mL via INTRAVENOUS

## 2018-07-18 MED ORDER — HYDROMORPHONE HCL 1 MG/ML IJ SOLN
1.0000 mg | Freq: Once | INTRAMUSCULAR | Status: AC
  Administered 2018-07-18: 1 mg via INTRAVENOUS
  Filled 2018-07-18: qty 1

## 2018-07-18 MED ORDER — BUPROPION HCL ER (SR) 150 MG PO TB12
150.0000 mg | ORAL_TABLET | Freq: Two times a day (BID) | ORAL | Status: DC
  Administered 2018-07-18 – 2018-07-22 (×8): 150 mg via ORAL
  Filled 2018-07-18 (×8): qty 1

## 2018-07-18 MED ORDER — TIOTROPIUM BROMIDE MONOHYDRATE 18 MCG IN CAPS
18.0000 ug | ORAL_CAPSULE | Freq: Every day | RESPIRATORY_TRACT | Status: DC
  Administered 2018-07-19 – 2018-07-22 (×4): 18 ug via RESPIRATORY_TRACT
  Filled 2018-07-18: qty 5

## 2018-07-18 NOTE — ED Triage Notes (Signed)
Pt reports having back surgery Friday, L3-4, 4-5 lumbar fusion and discharged yesterday. Pt reports severe back pain uncontrolled with pain meds, last taken at 1 am.

## 2018-07-18 NOTE — Progress Notes (Signed)
Patient ID: Chase Lloyd, male   DOB: 07/19/1956, 62 y.o.   MRN: [id number] BP (!) 142/63 (BP Location: Left Arm)   Pulse (!) 111   Temp (!) 100.5 F (38.1 C) (Oral)   Resp 20   Ht 5\' 10"  (1.778 m)   Wt 77.1 kg   SpO2 98%   BMI 24.39 kg/m  I have reviewed the MRI, and have discussed the results with Chase Lloyd. Given that he retains normal neurological function at this time I believe that an operation at L1,2 which would require a full laminectomy of L2 at least and possibly L1 can be deferred. If he develops weakness then of course operative decompression is mandated. I do believe however that we can treat his pain and allow the presumed clot resolve.

## 2018-07-18 NOTE — ED Provider Notes (Signed)
[facility] EMERGENCY DEPARTMENT Provider Note   CSN: [id number] Arrival date & time: 07/18/18  0820     History   Chief Complaint Chief Complaint  Patient presents with  . Back Pain  . Post-op Problem    HPI Chase Lloyd is a 62 y.o. male.  The history is provided by the patient and medical records. No language interpreter was used.  Back Pain   This is a new problem. The current episode started yesterday. The problem occurs constantly. The problem has not changed since onset.Associated with: recent surgery. The pain is present in the lumbar spine. The quality of the pain is described as aching. The pain does not radiate. The pain is at a severity of 10/10. The pain is severe. The symptoms are aggravated by bending and twisting. Pertinent negatives include no chest pain, no fever, no numbness, no headaches, no abdominal pain, no abdominal swelling, no pelvic pain, no leg pain, no paresthesias, no paresis, no tingling and no weakness. He has tried nothing for the symptoms. The treatment provided no relief.    Past Medical History:  Diagnosis Date  . Back pain   . COPD (chronic obstructive pulmonary disease) ([place])   . DDD (degenerative disc disease), lumbar   . Depression   . Emphysema of lung ([place])   . Empyema ([place])    2016  (was in [place])  . GERD (gastroesophageal reflux disease)   . Hearing loss of right ear    started 2001.     microphone in right ear ,  and hearing aid in left  . Hodgkin's disease ([place]) 1999  . Hypotension   . Meniere's disease   . Pneumonia   . Tinnitus     Patient Active Problem List   Diagnosis Date Noted  . Lumbar spinal stenosis 07/16/2018  . GERD (gastroesophageal reflux disease) 04/26/2018  . Intermittent claudication ([place]) 06/12/2017  . Lumbar spondylosis 06/12/2017  . COPD GOLD III 05/29/2017  . Tendinopathy of left gluteus medius 05/01/2017  . Cigarette smoker 05/01/2017    Past Surgical History:  Procedure  Laterality Date  . BACK SURGERY    . CERVICAL FUSION  2016  . ELBOW SURGERY    . EYE SURGERY    . LAMINECTOMY  1999  . lower back surgery    . TONSILLECTOMY          Home Medications    Prior to Admission medications   Medication Sig Start Date End Date Taking? Authorizing Provider  albuterol (PROVENTIL HFA;VENTOLIN HFA) 108 (90 Base) MCG/ACT inhaler Inhale 2 puffs into the lungs every 6 (six) hours as needed for wheezing or shortness of breath. 04/28/18   Tanda Rockers, MD  buPROPion (WELLBUTRIN SR) 150 MG 12 hr tablet Take 1 tablet (150 mg total) by mouth 2 (two) times daily. 04/26/18 07/25/18  Nafziger, Tommi Rumps, NP  ibuprofen (ADVIL,MOTRIN) 800 MG tablet Take 800 mg by mouth every 8 (eight) hours as needed (for pain.).     [provider]  methocarbamol (ROBAXIN-750) 750 MG tablet Take 1 tablet (750 mg total) by mouth 4 (four) times daily. 07/17/18   Ashok Pall, MD  omeprazole (PRILOSEC) 40 MG capsule Take 1 capsule (40 mg total) by mouth daily. 04/26/18   Nafziger, Tommi Rumps, NP  oxyCODONE (ROXICODONE) 5 MG immediate release tablet Take 1 tablet (5 mg total) by mouth every 6 (six) hours as needed for severe pain. 07/17/18   Ashok Pall, MD  SYMBICORT 160-4.5 MCG/ACT  inhaler Inhale 2 puffs into the lungs 2 (two) times daily. 05/10/18   Tanda Rockers, MD  Tiotropium Bromide Monohydrate (SPIRIVA RESPIMAT) 2.5 MCG/ACT AERS Inhale 2 puffs into the lungs daily. Patient not taking: Reported on 04/28/2018 06/19/17   Tanda Rockers, MD    Family History Family History  Problem Relation Age of Onset  . Hypertension Father   . Diabetes Father   . Testicular cancer Brother   . Melanoma Brother     Social History Social History   Tobacco Use  . Smoking status: Former Smoker    Packs/day: 1.50    Years: 47.00    Pack years: 70.50    Types: Cigarettes    Last attempt to quit: 06/13/2018    Years since quitting: 0.0  . Smokeless tobacco: Never Used  Substance Use Topics  .  Alcohol use: Yes    Comment: 24 cans in a week  . Drug use: No     Allergies   Patient has no known allergies.   Review of Systems Review of Systems  Constitutional: Negative for chills, diaphoresis, fatigue and fever.  HENT: Negative for congestion.   Eyes: Negative for visual disturbance.  Respiratory: Negative for cough, chest tightness, shortness of breath and wheezing.   Cardiovascular: Negative for chest pain, palpitations and leg swelling.  Gastrointestinal: Negative for abdominal pain, constipation, diarrhea, nausea and vomiting.  Genitourinary: Negative for flank pain and pelvic pain.  Musculoskeletal: Positive for back pain. Negative for neck pain and neck stiffness.  Neurological: Negative for tingling, syncope, weakness, light-headedness, numbness, headaches and paresthesias.  Psychiatric/Behavioral: Negative for agitation.  All other systems reviewed and are negative.    Physical Exam Updated Vital Signs BP (!) 120/59 (BP Location: Right Arm)   Pulse (!) 124   Temp 98.8 F (37.1 C) (Oral)   Resp 20   Ht 5\' 10"  (1.778 m)   Wt 77.1 kg   SpO2 96%   BMI 24.39 kg/m   Physical Exam  Constitutional: He is oriented to person, place, and time. He appears well-developed and well-nourished. No distress.  HENT:  Head: Normocephalic and atraumatic.  Eyes: Pupils are equal, round, and reactive to light. Conjunctivae are normal.  Neck: Normal range of motion. Neck supple.  Cardiovascular: Normal rate and regular rhythm.  No murmur heard. Pulmonary/Chest: Effort normal and breath sounds normal. No respiratory distress.  Abdominal: Soft. There is no tenderness.  Musculoskeletal: He exhibits tenderness. He exhibits no edema.       Lumbar back: He exhibits tenderness and pain.       Back:  Tenderness present in the patient's back over the midline surgical incision that is still dressed.  No purulence seen.  Minimal erythema.  Normal sensation and strength in the lower  legs.  Normal pulses.  Neurological: He is alert and oriented to person, place, and time. No cranial nerve deficit or sensory deficit. He exhibits normal muscle tone. Coordination normal.  Skin: Skin is warm and dry. Capillary refill takes less than 2 seconds. He is not diaphoretic. No erythema. No pallor.  Psychiatric: He has a normal mood and affect.  Nursing note and vitals reviewed.    ED Treatments / Results  Labs (all labs ordered are listed, but only abnormal results are displayed) Labs Reviewed  CBC WITH DIFFERENTIAL/PLATELET - Abnormal; Notable for the following components:      Result Value   RBC 2.81 (*)    Hemoglobin 9.2 (*)    HCT 29.0 (*)  MCV 103.2 (*)    Platelets 93 (*)    All other components within normal limits  COMPREHENSIVE METABOLIC PANEL - Abnormal; Notable for the following components:   Glucose, Bld 109 (*)    Creatinine, Ser 1.27 (*)    Calcium 8.4 (*)    Total Protein 6.2 (*)    Albumin 3.2 (*)    Total Bilirubin 2.0 (*)    GFR calc non Af Amer 58 (*)    All other components within normal limits    EKG EKG Interpretation  Date/Time:  Sunday July 18 2018 11:50:09 EDT Ventricular Rate:  118 PR Interval:    QRS Duration: 96 QT Interval:  278 QTC Calculation: 390 R Axis:   89 Text Interpretation:  Sinus tachycardia Borderline right axis deviation Borderline repolarization abnormality Baseline wander in lead(s) V6 when compared to prior, faster rate.  No STEMI Confirmed by Antony Blackbird [phone]) on 07/18/2018 2:26:35 PM   Radiology Dg Lumbar Spine 2-3 Views  Result Date: 07/16/2018 CLINICAL DATA:  Posterior lumbar interbody fusion EXAM: LUMBAR SPINE - 2-3 VIEW; DG C-ARM 61-120 MIN COMPARISON:  Radiograph 08/10/2017 FINDINGS: Two low resolution intraoperative spot views of the lumbar spine. Total fluoroscopy time was 43 seconds. The images demonstrate posterior stabilization rods and fixating screws L3 through L5 with interbody device at  L3-L4 and L4-L5. IMPRESSION: Intraoperative fluoroscopic assistance provided during lumbar spine surgery Electronically Signed   By: Donavan Foil M.D.   On: 07/16/2018 18:09   Dg C-arm 1-60 Min  Result Date: 07/16/2018 CLINICAL DATA:  Posterior lumbar interbody fusion EXAM: LUMBAR SPINE - 2-3 VIEW; DG C-ARM 61-120 MIN COMPARISON:  Radiograph 08/10/2017 FINDINGS: Two low resolution intraoperative spot views of the lumbar spine. Total fluoroscopy time was 43 seconds. The images demonstrate posterior stabilization rods and fixating screws L3 through L5 with interbody device at L3-L4 and L4-L5. IMPRESSION: Intraoperative fluoroscopic assistance provided during lumbar spine surgery Electronically Signed   By: Donavan Foil M.D.   On: 07/16/2018 18:09   Dg C-arm 1-60 Min  Result Date: 07/16/2018 CLINICAL DATA:  Posterior lumbar interbody fusion EXAM: LUMBAR SPINE - 2-3 VIEW; DG C-ARM 61-120 MIN COMPARISON:  Radiograph 08/10/2017 FINDINGS: Two low resolution intraoperative spot views of the lumbar spine. Total fluoroscopy time was 43 seconds. The images demonstrate posterior stabilization rods and fixating screws L3 through L5 with interbody device at L3-L4 and L4-L5. IMPRESSION: Intraoperative fluoroscopic assistance provided during lumbar spine surgery Electronically Signed   By: Donavan Foil M.D.   On: 07/16/2018 18:09   Dg C-arm 1-60 Min  Result Date: 07/16/2018 CLINICAL DATA:  Posterior lumbar interbody fusion EXAM: LUMBAR SPINE - 2-3 VIEW; DG C-ARM 61-120 MIN COMPARISON:  Radiograph 08/10/2017 FINDINGS: Two low resolution intraoperative spot views of the lumbar spine. Total fluoroscopy time was 43 seconds. The images demonstrate posterior stabilization rods and fixating screws L3 through L5 with interbody device at L3-L4 and L4-L5. IMPRESSION: Intraoperative fluoroscopic assistance provided during lumbar spine surgery Electronically Signed   By: Donavan Foil M.D.   On: 07/16/2018 18:09     Procedures Procedures (including critical care time)  Medications Ordered in ED Medications  HYDROmorphone (DILAUDID) injection 0.5 mg (0.5 mg Intravenous Given 07/18/18 1352)  HYDROmorphone (DILAUDID) injection 1 mg (1 mg Intravenous Given 07/18/18 0921)  sodium chloride 0.9 % bolus 1,000 mL (0 mLs Intravenous Stopped 07/18/18 1142)     Initial Impression / Assessment and Plan / ED Course  I have reviewed the triage vital signs and  the nursing notes.  Pertinent labs & imaging results that were available during my care of the patient were reviewed by me and considered in my medical decision making (see chart for details).     Chase Lloyd is a 62 y.o. male with a past medical history significant for COPD, GERD, and recent L3-L4 and L4-L5 lumbar surgery 2 days ago who presents with severe back pain and constipation.  Patient reports that he was discharged yesterday when he was able to ambulate and had improvement in his pain.  He reports that overnight his pain acutely worsened and he now has pain that is 10 out of 10 severity.  He denies fevers, chills, nausea, or vomiting.  He reports he has not had a bowel movement since the surgery but is having normal urination.  No dysuria.  He denies leg numbness aside from mild right hip numbness which was expected by report.  He has no leg weakness and normal sensation in the groin.  No cough congestion chest pain or abdominal pain.  Next  On exam, patient had surgical dressing in place with no significant bleeding or drainage seen.  No erythema seen.  Patient had tenderness at the site of the surgery.  No abdominal tenderness or chest tenderness.  Lungs clear.  Patient normal sensation in both legs, had symmetric DP pulses, had normal strength in the legs.  Clinically I am concerned patient may have a postoperative hematoma or other cause of acutely worsening back pain.  Timeline is not quite consistent with a surgical site infection and patient  was afebrile.  Patient had screening laboratory testing which showed slightly worsening creatinine and worsened anemia however other laboratory testing was reassuring.    Neurosurgery team was called and 1 of the surgeon to operate on him came to the patient.  They are also concerned about postoperative hematoma and will admit the patient for MRI and further management.    Patient will be admitted for further management.   Final Clinical Impressions(s) / ED Diagnoses   Final diagnoses:  Acute midline low back pain without sciatica     Clinical Impression: 1. Acute midline low back pain without sciatica     Disposition: Admit  This note was prepared with assistance of Dragon voice recognition software. Occasional wrong-word or sound-a-like substitutions may have occurred due to the inherent limitations of voice recognition software.      Tegeler, Gwenyth Allegra, MD 07/18/18 1430

## 2018-07-18 NOTE — ED Notes (Signed)
Report given to woody on 4n

## 2018-07-18 NOTE — H&P (Signed)
Neurosurgery Consultation  CC: Back pain  HPI: This is a 62 y.o. man that presents with severe back pain. He had a posterior lumbar surgery 2d ago and was doing well. He was discharged yesterday with pain well controlled, but last night he had very severe low back pain that he said was worse than immediately post-op and worse than any pain he had after his prior posterior cervical surgery.  No new weakness, numbness, or parasthesias, no recent change in bowel or bladder function. No recent use of anti-platelet or anti-coagulant medications.   ROS: A 14 point ROS was performed and is negative except as noted in the HPI.   PMHx:  Past Medical History:  Diagnosis Date  . Back pain   . COPD (chronic obstructive pulmonary disease) ([place])   . DDD (degenerative disc disease), lumbar   . Depression   . Emphysema of lung ([place])   . Empyema ([place])    2016  (was in [place])  . GERD (gastroesophageal reflux disease)   . Hearing loss of right ear    started 2001.     microphone in right ear ,  and hearing aid in left  . Hodgkin's disease ([place]) 1999  . Hypotension   . Meniere's disease   . Pneumonia   . Tinnitus    FamHx:  Family History  Problem Relation Age of Onset  . Hypertension Father   . Diabetes Father   . Testicular cancer Brother   . Melanoma Brother    SocHx:  reports that he quit smoking about 5 weeks ago. His smoking use included cigarettes. He has a 70.50 pack-year smoking history. He has never used smokeless tobacco. He reports that he drinks alcohol. He reports that he does not use drugs.  Exam: Vital signs in last 24 hours: Temp:  [98.8 F (37.1 C)] 98.8 F (37.1 C) (09/15 0836) Pulse Rate:  [115-124] 115 (09/15 1230) Resp:  [18-33] 23 (09/15 1230) BP: (120-155)/(59-77) 152/64 (09/15 1230) SpO2:  [92 %-96 %] 92 % (09/15 1230) Weight:  [77.1 kg-77.4 kg] 77.1 kg (09/15 0836) General: Awake, alert, cooperative, lying in bed in NAD Head: normocephalic and  atruamatic HEENT: neck supple Pulmonary: breathing room air comfortably, no evidence of increased work of breathing Cardiac: RRR Abdomen: S NT ND Extremities: warm and well perfused x4 Neuro: AOx3, PERRL, EOMI, FS Strength 5/5 x4, SILTx4 Incision c/d/i   Assessment and Plan: 62 y.o. man POD2 s/p lumbar fusion with severe low back pain that was not present in the more immediate post-operative period. Clinical scenario is concerning for a post-operative spinal hematoma. -keep NPO -MRI L-spine w/o contrast -will require admission for pain control, will place admission orders -please call with any concerns or questions  Judith Part, MD 07/18/18 1:11 PM [facility]

## 2018-07-18 NOTE — ED Notes (Signed)
IV attempted x2. Second RN recruited for placement.

## 2018-07-18 NOTE — Progress Notes (Signed)
Upon arrival to pt's room, pt moving around in bed, appears to be in pain, audible wheeze noted. Pt states it's normal for him to wheeze when he's exerting himself. Scheduled Dulera given at this time. Lungs are diminished bilaterally, with upper airway wheeze noted. RT will continue to monitor.

## 2018-07-19 MED ORDER — HYDROCODONE-ACETAMINOPHEN 5-325 MG PO TABS
1.0000 | ORAL_TABLET | ORAL | Status: DC | PRN
  Administered 2018-07-19 – 2018-07-20 (×4): 2 via ORAL
  Filled 2018-07-19 (×4): qty 2

## 2018-07-19 MED FILL — Thrombin (Recombinant) For Soln 5000 Unit: CUTANEOUS | Qty: 5000 | Status: AC

## 2018-07-19 NOTE — Progress Notes (Signed)
  NEUROSURGERY PROGRESS NOTE   No issues overnight. Pt was doing well on POD#1, discharged home. Had sudden onset of severe primarily low back pain. MRI done on admission.  EXAM:  BP 126/69 (BP Location: Right Arm)   Pulse (!) 113   Temp 97.9 F (36.6 C)   Resp (!) 33   Ht 5\' 10"  (1.778 m)   Wt 77.1 kg   SpO2 98%   BMI 24.39 kg/m   Awake, alert, oriented  Speech fluent, appropriate  CN grossly intact  5/5 BUE/BLE including IP, Q, PF, DF  IMPRESSION:  62 y.o. male POD#3 s/p L3-L5 PLIF, MRI demonstrates L1-2 likely subdural hematoma with some compression of the cauda roots. He remains neurologically intact. In an intact patient I think continued observation is reasonable rather than attempting operative evacuation which puts him at risk for recurrent hematoma, CSF leak, and adjacent level failure above the previous construct.  PLAN: - Will add oral hydrocodone, keep PRN IV dilaudid - Mobilize today with PT/OT - Increase frequency of breathing treatments - Cont to monitor neurologic exam.  I did review the situation with the patient. He is agreeable to the plan above. All questions were answered.

## 2018-07-19 NOTE — Plan of Care (Signed)
  Problem: Clinical Measurements: Goal: Postoperative complications will be avoided or minimized Outcome: Not Progressing   Problem: Clinical Measurements: Goal: Ability to maintain clinical measurements within normal limits will improve Outcome: Progressing

## 2018-07-20 MED ORDER — OXYCODONE HCL ER 10 MG PO T12A
10.0000 mg | EXTENDED_RELEASE_TABLET | Freq: Two times a day (BID) | ORAL | Status: DC
  Administered 2018-07-20 (×2): 10 mg via ORAL
  Filled 2018-07-20 (×2): qty 1

## 2018-07-20 MED ORDER — OXYCODONE HCL 5 MG PO TABS
5.0000 mg | ORAL_TABLET | ORAL | Status: DC | PRN
  Administered 2018-07-20 (×2): 10 mg via ORAL
  Filled 2018-07-20 (×2): qty 2

## 2018-07-20 MED ORDER — MAGNESIUM CITRATE PO SOLN
1.0000 | Freq: Once | ORAL | Status: AC
  Administered 2018-07-20: 1 via ORAL
  Filled 2018-07-20: qty 296

## 2018-07-20 MED ORDER — METHOCARBAMOL 500 MG PO TABS
750.0000 mg | ORAL_TABLET | Freq: Four times a day (QID) | ORAL | Status: DC
  Administered 2018-07-20 – 2018-07-22 (×8): 750 mg via ORAL
  Filled 2018-07-20 (×8): qty 2

## 2018-07-20 NOTE — Progress Notes (Addendum)
Neurosurgery Progress Note  No issues overnight. Continues to have moderate LBP. Denies focal deficits. Has not had a BM in several days.  Tolerating po. Urinating normal.  EXAM:  BP (!) 120/27 (BP Location: Left Arm)   Pulse (!) 123   Temp 98.3 F (36.8 C) (Oral)   Resp (!) 25   Ht 5\' 10"  (1.778 m)   Wt 77.1 kg   SpO2 97%   BMI 24.39 kg/m   Awake, alert, oriented  Speech fluent, appropriate  CN grossly intact  5/5 BUE/BLE  Incision c/d/i  IMPRESSION/PLAN 62 y.o. male POD#4 s/p L3-L5 PLIF, MRI demonstrates L1-2 likely subdural hematoma with some compression of the cauda roots. He remains neurologically intact.  - Will continue with close observation - Neuro checks q 2 hours. Report any change - Work with therapy today - Adjusted pain medications. Added ER OxyContin, Oxycodone IR prn and Robaxin  I have seen and examined Chase Lloyd and agree with the exam, impression, and plan as documented in the note by Chase Reus, PA-C.  Chase Lose, MD [facility]

## 2018-07-20 NOTE — Progress Notes (Signed)
Occupational Therapy Evaluation Patient Details Name: Chase Lloyd MRN: [id number] DOB: 1956-01-14 Today's Date: 07/20/2018    History of Present Illness 61 y.o. male who underwent  L3-L5 PLIF 9/13 and was discharged home. Pt returned to the hospital on 9/15 due to back pain.  MRI demonstrates L1-2 likely subdural hematoma with some compression of the cauda roots. PMH significant for COPD.    Clinical Impression   PTA, pt living at home and was independent with ADL and mobility. Pt currently requires min A with mobility and Mod A with LB ADL. Per neurosurgery note, pt to ambulate with OT/PT. Pt ambulated @ 160 ft and upon returning to room, pt with 3/4 DOE and Spo2 81 on RA, demonstrating mild confusion regarding how to return to bed safely.  2L applied and O2 Sats quickly rose to 96. Nsg notified. Pt should progress to DC home. Wife works therefore recommend DC home with HHOT to facilitate safe return to ADL and mobility. Will follow acutely. Will need to monitor O2 sats with ambulation and activity.     Follow Up Recommendations  Supervision/Assistance - 24 hour (initially);Home health OT    Equipment Recommendations  None recommended by OT    Recommendations for Other Services PT consult     Precautions / Restrictions Precautions Precautions: Back Required Braces or Orthoses: Spinal Brace Spinal Brace: Lumbar corset;Applied in sitting position;Applied in standing position Restrictions Weight Bearing Restrictions: No      Mobility Bed Mobility Overal bed mobility: Needs Assistance Bed Mobility: Rolling;Sidelying to Sit   Sidelying to sit: Supervision     Sit to sidelying: Min assist   Transfers Overall transfer level: Independent Equipment used: None Transfers: Sit to/from Stand Sit to Stand: Min assist         General transfer comment: vc for back precautions; vc for safe positioning to descend to bed. Pt appears confused - note O2 dest to 81 RA    Balance  Overall balance assessment: Mild deficits observed, Standing balance fair; reliant on external support                                         ADL either performed or assessed with clinical judgement   ADL Overall ADL's : Needs assistance/impaired     Grooming: Set up;Supervision/safety;Standing   Upper Body Bathing: Set up;Supervision/ safety;Sitting   Lower Body Bathing: Moderate assistance;Sit to/from stand   Upper Body Dressing : Minimal assistance;Sitting Upper Body Dressing Details (indicate cue type and reason): A to donn brace Lower Body Dressing: Moderate assistance;Sit to/from stand Lower Body Dressing Details (indicate cue type and reason): unablet o complete figure 4 Toilet Transfer: Minimal assistance;Ambulation;BSC;RW   Toileting- Clothing Manipulation and Hygiene: Moderate assistance       Functional mobility during ADLs: Minimal assistance;Rolling walker;Cueing for safety General ADL Comments: repetitive cues to not pull up on RW     Vision Patient Visual Report: No change from baseline       Perception     Praxis      Pertinent Vitals/Pain Pain Assessment: 0-10 Pain Score: 10-Worst pain ever Pain Location: incisonal Pain Descriptors / Indicators: Sore;Operative site guarding Pain Intervention(s): Limited activity within patient's tolerance;Patient requesting pain meds-RN notified     Hand Dominance Right   Extremity/Trunk Assessment Upper Extremity Assessment Upper Extremity Assessment: Overall WFL for tasks assessed   Lower Extremity Assessment Lower Extremity Assessment:  Generalized weakness(radicular pain L>R)   Cervical / Trunk Assessment Cervical / Trunk Assessment: Kyphotic   Communication Communication Communication: No difficulties   Cognition Arousal/Alertness: Awake/alert Behavior During Therapy: WFL for tasks assessed/performed Overall Cognitive Status: Within Functional Limits for tasks assessed                                  General Comments: pt demonstrated confusion when returning to bed. Noted O2 desat to 81   General Comments  Reinforced all of the back precautions, lifting restrictions, donning the brace and progression of activity.    Exercises     Shoulder Instructions      Home Living Family/patient expects to be discharged to:: Private residence Living Arrangements: Spouse/significant other;Children Available Help at Discharge: Family;Available PRN/intermittently Type of Home: House Home Access: Stairs to enter [employer] of Steps: 4 Entrance Stairs-Rails: None Home Layout: One level     Bathroom Shower/Tub: Occupational psychologist: Standard Bathroom Accessibility: Yes   Home Equipment: None   Additional Comments: wife works part time days, son with mental disabilites can A with some things (but son does work part time)      Prior Functioning/Environment Level of Independence: Independent        Comments: enjoys being with grandkids        OT Problem List: Decreased range of motion;Pain;Decreased activity tolerance;Decreased safety awareness;Decreased knowledge of use of DME or AE;Decreased knowledge of precautions      OT Treatment/Interventions: Self-care/ADL training;DME and/or AE instruction;Energy conservation;Therapeutic activities;Patient/family education    OT Goals(Current goals can be found in the care plan section) Acute Rehab OT Goals Patient Stated Goal: to go home OT Goal Formulation: With patient Time For Goal Achievement: 08/03/18 Potential to Achieve Goals: Good  OT Frequency: Min 2X/week   Barriers to D/C:            Co-evaluation              AM-PAC PT "6 Clicks" Daily Activity     Outcome Measure   Help from another person taking care of personal grooming?: A Little Help from another person toileting, which includes using toliet, bedpan, or urinal?: A Little   Help from another person  to put on and taking off regular upper body clothing?: A Little Help from another person to put on and taking off regular lower body clothing?: A Lot 6 Click Score: 11   End of Session Equipment Utilized During Treatment: Back brace;Rolling walker Nurse Communication: Mobility status;Other (comment)(need to monitor O2 sats)  Activity Tolerance: Patient limited by pain Patient left: in bed;with call bell/phone within reach;with family/visitor present  OT Visit Diagnosis: Pain;Other abnormalities of gait and mobility (R26.89);Muscle weakness (generalized) (M62.81) Pain - part of body: (back)                Time: 7026-3785 OT Time Calculation (min): 23 min Charges:  OT General Charges $OT Visit: 1 Visit OT Evaluation $OT Eval Moderate Complexity: 1 Mod OT Treatments $Self Care/Home Management : 8-22 mins  Maurie Boettcher, OT/L   Acute OT Clinical Specialist [place] Pager [phone] Office [phone]   [facility] 07/20/2018, 4:27 PM

## 2018-07-20 NOTE — Progress Notes (Signed)
Physical Therapy Evaluation Patient Details Name: Chase Lloyd MRN: [id number] DOB: Sep 30, 1956 Today's Date: 07/20/2018   History of Present Illness  62 y.o. male who underwent  L3-L5 PLIF 9/13 and was discharged home. Pt returned to the hospital on 9/15 due to back pain.  MRI demonstrates L1-2 likely subdural hematoma with some compression of the cauda roots. PMH significant for COPD.   Clinical Impression  Pt admitted with/for worsening back pain which was found to be due a post op SDH at L1-2.  Pt needing supervision to min guard assist for basic mobility at this time.  Pt currently limited functionally due to the problems listed below.  (see problems list.)  Pt will benefit from PT to maximize function and safety to be able to get home safely with available assist.     Follow Up Recommendations No PT follow up    Equipment Recommendations  None recommended by PT    Recommendations for Other Services       Precautions / Restrictions Precautions Precautions: Back Required Braces or Orthoses: Spinal Brace Spinal Brace: Lumbar corset;Applied in sitting position;Applied in standing position Restrictions Weight Bearing Restrictions: No      Mobility  Bed Mobility Overal bed mobility: Needs Assistance Bed Mobility: Rolling;Sidelying to Sit;Sit to Sidelying Rolling: Supervision Sidelying to sit: Supervision     Sit to sidelying: Min assist General bed mobility comments: pt used good technique to get up OOB, but need LE assist to get back in.  Transfers Overall transfer level: Needs assistance Equipment used: None Transfers: Sit to/from Stand Sit to Stand: Min assist         General transfer comment: cues for hand placement, steady assist while pt struggling to stand  Ambulation/Gait Ambulation/Gait assistance: Min guard;Supervision Gait Distance (Feet): 180 Feet Assistive device: Rolling walker (2 wheeled) Gait Pattern/deviations: Step-through pattern   Gait  velocity interpretation: <1.8 ft/sec, indicate of risk for recurrent falls General Gait Details: mildly unsteady and antalgic gait.  Stairs            Wheelchair Mobility    Modified Rankin (Stroke Patients Only)       Balance Overall balance assessment: Mild deficits observed, not formally tested                                           Pertinent Vitals/Pain Pain Assessment: 0-10 Pain Score: 10-Worst pain ever Pain Location: incisonal Pain Descriptors / Indicators: Sore;Operative site guarding Pain Intervention(s): Limited activity within patient's tolerance;Patient requesting pain meds-RN notified    Home Living Family/patient expects to be discharged to:: Private residence Living Arrangements: Spouse/significant other;Children Available Help at Discharge: Family;Available PRN/intermittently Type of Home: House Home Access: Stairs to enter Entrance Stairs-Rails: None Entrance Stairs-Number of Steps: 4 Home Layout: One level Home Equipment: None Additional Comments: wife works part time days, son with mental disabilites can A with some things (but son does work part time)    Prior Function Level of Independence: Independent         Comments: enjoys being with grandkids     Hand Dominance   Dominant Hand: Right    Extremity/Trunk Assessment   Upper Extremity Assessment Upper Extremity Assessment: Overall WFL for tasks assessed    Lower Extremity Assessment Lower Extremity Assessment: Generalized weakness(radicular pain L>R)    Cervical / Trunk Assessment Cervical / Trunk Assessment: Kyphotic  Communication  Communication: No difficulties  Cognition Arousal/Alertness: Awake/alert Behavior During Therapy: WFL for tasks assessed/performed Overall Cognitive Status: Within Functional Limits for tasks assessed                                 General Comments: pt demonstrated confusion when returning to bed. Noted O2  desat to 81      General Comments General comments (skin integrity, edema, etc.): Reinforced all of the back precautions, lifting restrictions, donning the brace and progression of activity.    Exercises     Assessment/Plan    PT Assessment Patient needs continued PT services  PT Problem List Decreased activity tolerance;Decreased strength;Pain       PT Treatment Interventions Gait training;DME instruction;Functional mobility training;Therapeutic activities;Patient/family education    PT Goals (Current goals can be found in the Care Plan section)  Acute Rehab PT Goals Patient Stated Goal: to go home PT Goal Formulation: With patient Time For Goal Achievement: 07/23/18 Potential to Achieve Goals: Good    Frequency Min 3X/week   Barriers to discharge        Co-evaluation               AM-PAC PT "6 Clicks" Daily Activity  Outcome Measure Difficulty turning over in bed (including adjusting bedclothes, sheets and blankets)?: None Difficulty moving from lying on back to sitting on the side of the bed? : None Difficulty sitting down on and standing up from a chair with arms (e.g., wheelchair, bedside commode, etc,.)?: Unable Help needed moving to and from a bed to chair (including a wheelchair)?: A Little Help needed walking in hospital room?: A Little Help needed climbing 3-5 steps with a railing? : A Little 6 Click Score: 18    End of Session Equipment Utilized During Treatment: Back brace Activity Tolerance: Patient limited by pain;Other (comment)(labored breathing) Patient left: in bed;with call bell/phone within reach;with bed alarm set Nurse Communication: Mobility status PT Visit Diagnosis: Other abnormalities of gait and mobility (R26.89);Pain Pain - part of body: (back, legs)    Time: 3875-6433 PT Time Calculation (min) (ACUTE ONLY): 27 min   Charges:   PT Evaluation $PT Eval Low Complexity: 1 Low PT Treatments $Gait Training: 8-22 mins         07/20/2018  Chase Lloyd, PT Acute Rehabilitation Services [phone]  (pager) [phone]  (office)  Tessie Fass Jariah Jarmon 07/20/2018, 4:34 PM

## 2018-07-20 NOTE — Progress Notes (Signed)
Called Answering service and spoke with Tristen for Dr to return call in reference to change in pain med to Hydrocodone and heartburn medication that Robaxin caused per patient.

## 2018-07-21 MED ORDER — HYDROCODONE-ACETAMINOPHEN 10-325 MG PO TABS
1.0000 | ORAL_TABLET | ORAL | Status: DC | PRN
  Administered 2018-07-21 – 2018-07-22 (×7): 2 via ORAL
  Filled 2018-07-21 (×7): qty 2

## 2018-07-21 MED ORDER — HYDROCODONE-ACETAMINOPHEN 10-325 MG PO TABS: 1.0000 | ORAL_TABLET | ORAL | 0 refills | Status: DC | PRN

## 2018-07-21 NOTE — Care Management Important Message (Signed)
Important Message  Patient Details  Name: Chase Lloyd MRN: [id number] Date of Birth: [birth year]   Medicare Important Message Given:  Yes    Orbie Pyo 07/21/2018, 3:21 PM

## 2018-07-21 NOTE — Progress Notes (Signed)
Physical Therapy Treatment Patient Details Name: Chase Lloyd MRN: [id number] DOB: 12/04/1955 Today's Date: 07/21/2018    History of Present Illness 62 y.o. male who underwent  L3-L5 PLIF 9/13 and was discharged home. Pt returned to the hospital on 9/15 due to back pain.  MRI demonstrates L1-2 likely subdural hematoma with some compression of the cauda roots. PMH significant for COPD.     PT Comments    Notably improved pain tolerance, breathing and stability.  Reinforced education.   Follow Up Recommendations  No PT follow up     Equipment Recommendations  None recommended by PT    Recommendations for Other Services       Precautions / Restrictions Precautions Precautions: Back Required Braces or Orthoses: Spinal Brace Spinal Brace: Lumbar corset;Applied in sitting position;Applied in standing position    Mobility  Bed Mobility Overal bed mobility: Modified Independent             General bed mobility comments: Discussed pt not trying to take the short cuts that cause him to twist so much  Transfers Overall transfer level: Needs assistance Equipment used: Rolling walker (2 wheeled) Transfers: Sit to/from Stand Sit to Stand: Supervision         General transfer comment: cues for hand placement, pt loves to pull up on the RW.  Ambulation/Gait Ambulation/Gait assistance: Supervision Gait Distance (Feet): 180 Feet Assistive device: Rolling walker (2 wheeled) Gait Pattern/deviations: Step-through pattern Gait velocity: slower Gait velocity interpretation: 1.31 - 2.62 ft/sec, indicative of limited community ambulator General Gait Details: less unsteady and less antalgic.  Pt also did not have the same struggle to breathe.   Stairs             Wheelchair Mobility    Modified Rankin (Stroke Patients Only)       Balance Overall balance assessment: No apparent balance deficits (not formally assessed)                                           Cognition Arousal/Alertness: Awake/alert Behavior During Therapy: WFL for tasks assessed/performed Overall Cognitive Status: Within Functional Limits for tasks assessed                                        Exercises      General Comments General comments (skin integrity, edema, etc.): Reinforced all back care.  pt able to focus due to lower pain and fatigue.  pt asked appropriate questions.      Pertinent Vitals/Pain Pain Assessment: Faces Faces Pain Scale: Hurts even more Pain Location: incisonal Pain Descriptors / Indicators: Sore;Operative site guarding Pain Intervention(s): Monitored during session    Home Living                      Prior Function            PT Goals (current goals can now be found in the care plan section) Acute Rehab PT Goals Patient Stated Goal: to go home PT Goal Formulation: With patient Time For Goal Achievement: 07/23/18 Potential to Achieve Goals: Good Progress towards PT goals: Progressing toward goals    Frequency    Min 3X/week      PT Plan Current plan remains appropriate    Co-evaluation  AM-PAC PT "6 Clicks" Daily Activity  Outcome Measure  Difficulty turning over in bed (including adjusting bedclothes, sheets and blankets)?: None Difficulty moving from lying on back to sitting on the side of the bed? : None Difficulty sitting down on and standing up from a chair with arms (e.g., wheelchair, bedside commode, etc,.)?: A Little Help needed moving to and from a bed to chair (including a wheelchair)?: None Help needed walking in hospital room?: A Little Help needed climbing 3-5 steps with a railing? : A Little 6 Click Score: 21    End of Session Equipment Utilized During Treatment: Back brace Activity Tolerance: Patient tolerated treatment well;Patient limited by pain Patient left: in chair;with call bell/phone within reach Nurse Communication: Mobility status PT  Visit Diagnosis: Other abnormalities of gait and mobility (R26.89);Pain     Time: 5379-4327 PT Time Calculation (min) (ACUTE ONLY): 22 min  Charges:  $Gait Training: 8-22 mins                     07/21/2018  Donnella Sham, PT Acute Rehabilitation Services [phone]  (pager) [phone]  (office)   [name] 07/21/2018, 10:45 AM

## 2018-07-21 NOTE — Discharge Summary (Addendum)
Physician Discharge Summary  Patient ID: Chase Lloyd MRN: [id number] DOB/AGE: 1956/04/17 62 y.o.  Admit date: 07/18/2018 Discharge date: 07/21/2018  Admission Diagnoses:  Low back pain SDH of neuraxis  Discharge Diagnoses:  Same Active Problems:   Low back pain   Traumatic subdural hematoma of neuraxis [facility])   Discharged Condition: Stable  Hospital Course:  Chase Lloyd is a 62 y.o. male who recently underwent two level PLIF Friday 9/13 and was discharge 9/14 in stable condition with significant improvement in pre op pain. Unfortunately, pain increased significantly and he presented to ER Sunday. An MRI was ordered and emonstrates L1-2 likely subdural hematoma with some compression of the cauda roots. He was neurologically intact. There was significant risk for NS intervention and minimal benefit so it was determined to monitor closely and omnly operate should his neuro exam change. He remained neuro intact throughout hospital stay. He worked with PT/OT and was determined to be suitable for d/c home. At time of discharge, pain was well controlled, ambulating with Pt/OT, tolerating po, voiding normal. Ready for discharge.  Treatments: Surgery - none  Discharge Exam: Blood pressure 128/61, pulse (!) 107, temperature 98.4 F (36.9 C), temperature source Oral, resp. rate 17, height 5\' 10"  (1.778 m), weight 77.1 kg, SpO2 100 %. Awake, alert, oriented Speech fluent, appropriate CN grossly intact 5/5 BUE/BLE Wound c/d/i  Disposition: Discharge disposition: 01-Home or Self Care       Discharge Instructions    Call MD for:  difficulty breathing, headache or visual disturbances   Complete by:  As directed    Call MD for:  persistant dizziness or light-headedness   Complete by:  As directed    Call MD for:  redness, tenderness, or signs of infection (pain, swelling, redness, odor or green/yellow discharge around incision site)   Complete by:  As directed    Call MD for:   severe uncontrolled pain   Complete by:  As directed    Call MD for:  temperature >100.4   Complete by:  As directed    Diet general   Complete by:  As directed    Driving Restrictions   Complete by:  As directed    Do not drive until given clearance.   Increase activity slowly   Complete by:  As directed    Lifting restrictions   Complete by:  As directed    Do not lift anything >10lbs. Avoid bending and twisting in awkward positions. Avoid bending at the back.   May shower / Bathe   Complete by:  As directed    In 24 hours. Okay to wash wound with warm soapy water. Avoid scrubbing the wound. Pat dry.   Remove dressing in 24 hours   Complete by:  As directed      Allergies as of 07/21/2018   No Known Allergies     Medication List    STOP taking these medications   oxyCODONE 5 MG immediate release tablet Commonly known as:  Oxy IR/ROXICODONE     TAKE these medications   albuterol 108 (90 Base) MCG/ACT inhaler Commonly known as:  PROVENTIL HFA;VENTOLIN HFA Inhale 2 puffs into the lungs every 6 (six) hours as needed for wheezing or shortness of breath.   buPROPion 150 MG 12 hr tablet Commonly known as:  WELLBUTRIN SR Take 1 tablet (150 mg total) by mouth 2 (two) times daily.   HYDROcodone-acetaminophen 10-325 MG tablet Commonly known as:  NORCO Take 1-2 tablets by mouth every  4 (four) hours as needed.   ibuprofen 800 MG tablet Commonly known as:  ADVIL,MOTRIN Take 800 mg by mouth every 8 (eight) hours as needed (for pain.).   methocarbamol 750 MG tablet Commonly known as:  ROBAXIN Take 1 tablet (750 mg total) by mouth 4 (four) times daily.   omeprazole 40 MG capsule Commonly known as:  PRILOSEC Take 1 capsule (40 mg total) by mouth daily.   SYMBICORT 160-4.5 MCG/ACT inhaler Generic drug:  budesonide-formoterol Inhale 2 puffs into the lungs 2 (two) times daily.      Follow-up Information    Consuella Lose, MD. Schedule an appointment as soon as possible  for a visit in 3 week(s).   Specialty:  Neurosurgery Contact information: [address] [place] [address] [place] [address] [phone]           Signed: Traci Sermon 07/21/2018, 8:18 AM

## 2018-07-21 NOTE — Progress Notes (Addendum)
Neurosurgery Progress Note  No issues overnight. Back pain much more manageable with hydrocodone vs oxycodone Denies radicular symptoms. Strength subjectively stable  EXAM:  BP 128/61   Pulse (!) 107   Temp 98.4 F (36.9 C) (Oral)   Resp 17   Ht 5\' 10"  (1.778 m)   Wt 77.1 kg   SpO2 100%   BMI 24.39 kg/m   Awake, alert, oriented  Speech fluent, appropriate  CN grossly intact  5/5 BUE/BLE Incision c/d/i  IMPRESSION/PLAN 62 y.o. male POD#5 s/p L3-L5 PLIF, MRI demonstrates L1-2 likely subdural hematoma with some compression of the cauda roots. He remains neurologically intact.  - Worked with therapy yesterday. No f/u rec. - Pain better with hydrocodone vs. Oxy. D/C oxy orders. Started Hydrocodone 10-325 1-2 tabs q 4 hours - Believe he is stable for d/c. Will see how he feels later this after. D/C signed should he feel up to it. Otherwise will d/c tomorrow. Communicated with nursing.  I have seen and examined Chase Lloyd and agree with the exam, impression, and plan as documented in the note above by [employer], PA-C.  Consuella Lose, MD [facility]

## 2018-07-21 NOTE — Progress Notes (Signed)
Occupational Therapy Treatment Patient Details Name: Chase Lloyd MRN: [id number] DOB: [birth year] Today's Date: 07/21/2018    History of present illness 62 y.o. male who underwent  L3-L5 PLIF 9/13 and was discharged home. Pt returned to the hospital on 9/15 due to back pain.  MRI demonstrates L1-2 likely subdural hematoma with some compression of the cauda roots. PMH significant for COPD.    OT comments  Upon OT arrival pt in chair without brace.  OT Aed pt don brace in sitting/standing. Encouraged deep breathing during OT session and incentive spirometer   Follow Up Recommendations  Supervision/Assistance - 24 hour;Home health OT    Equipment Recommendations  None recommended by OT    Recommendations for Other Services PT consult    Precautions / Restrictions Precautions Precautions: Back Required Braces or Orthoses: Spinal Brace Spinal Brace: Lumbar corset;Applied in sitting position;Applied in standing position       Mobility Bed Mobility Overal bed mobility: Modified Independent             General bed mobility comments: Discussed pt not trying to take the short cuts that cause him to twist so much  Transfers Overall transfer level: Needs assistance Equipment used: Rolling walker (2 wheeled) Transfers: Sit to/from Stand Sit to Stand: Supervision         General transfer comment: cues for hand placement    Balance Overall balance assessment: No apparent balance deficits (not formally assessed)                                         ADL either performed or assessed with clinical judgement   ADL Overall ADL's : Needs assistance/impaired                 Upper Body Dressing : Minimal assistance;Sitting;Standing Upper Body Dressing Details (indicate cue type and reason): A to donn brace Lower Body Dressing: Moderate assistance;Sit to/from stand Lower Body Dressing Details (indicate cue type and reason): per pt - wife and daughter  will A as needed Toilet Transfer: Minimal assistance;RW;Comfort height toilet   Toileting- Clothing Manipulation and Hygiene: Minimal assistance;Sit to/from stand;Cueing for safety         General ADL Comments: max VC for hand placement with sit to stand.  Wife present for end of OT session. Wife with questions related to dressing, Instructed her to ask RN for instruction     Vision Patient Visual Report: No change from baseline            Cognition Arousal/Alertness: Awake/alert Behavior During Therapy: WFL for tasks assessed/performed Overall Cognitive Status: Within Functional Limits for tasks assessed                                                     Pertinent Vitals/ Pain       Faces Pain Scale: Hurts a little bit Pain Location: incisonal Pain Descriptors / Indicators: Discomfort Pain Intervention(s): Limited activity within patient's tolerance;Monitored during session     Prior Functioning/Environment              Frequency  Min 2X/week        Progress Toward Goals  OT Goals(current goals can now be found in the care plan section)  Progress towards OT goals: Progressing toward goals     Plan Discharge plan remains appropriate       AM-PAC PT "6 Clicks" Daily Activity     Outcome Measure   Help from another person eating meals?: None Help from another person taking care of personal grooming?: A Little Help from another person toileting, which includes using toliet, bedpan, or urinal?: A Little Help from another person bathing (including washing, rinsing, drying)?: A Little Help from another person to put on and taking off regular upper body clothing?: A Little Help from another person to put on and taking off regular lower body clothing?: A Lot 6 Click Score: 18    End of Session Equipment Utilized During Treatment: Back brace;Rolling walker  OT Visit Diagnosis: Pain;Other abnormalities of gait and mobility (R26.89);Muscle  weakness (generalized) (M62.81) Pain - part of body: (back)   Activity Tolerance Patient limited by pain   Patient Left in bed;with call bell/phone within reach;with family/visitor present   Nurse Communication Mobility status;Other (comment)(need to monitor O2 sats)        Time: 3335-4562 OT Time Calculation (min): 18 min  Charges: OT Evaluation $OT Eval Low Complexity: 1 Low OT Treatments $Self Care/Home Management : 8-22 mins  Kari Baars, OT Acute Rehabilitation Services [phone] Office- [phone], Edwena Felty D 07/21/2018, 3:49 PM

## 2018-07-22 NOTE — Progress Notes (Signed)
Neurosurgery Progress Note  No issues overnight.  Back pain significantly improved Denies focal deficits Eager for d/c home  EXAM:  BP (!) 162/73 (BP Location: Left Arm)   Pulse 100   Temp (!) 97.5 F (36.4 C) (Oral)   Resp 18   Ht 5\' 10"  (1.778 m)   Wt 77.1 kg   SpO2 98%   BMI 24.39 kg/m   Awake, alert, oriented  Speech fluent, appropriate  CN grossly intact  MAEW with good strength Incision c/d/i  PLAN 61 y.o.malePOD#5s/p L3-L5 PLIF, MRI demonstrates L1-2 likely subdural hematoma with some compression of the cauda roots. He remains neurologically intact.  - Pain much improved. - [place] for d/c today. Orders signed

## 2018-07-22 NOTE — Progress Notes (Signed)
Patient's IV removed and discharge instructions reviewed with patient.  Patient given written prescription for Hydrocodone.  Patient transported via wheelchair and discharged home with wife.

## 2018-07-30 ENCOUNTER — Ambulatory Visit: Payer: Medicare Other | Admitting: Internal Medicine

## 2018-08-04 DIAGNOSIS — I10 Essential (primary) hypertension: Secondary | ICD-10-CM | POA: Diagnosis not present

## 2018-08-04 DIAGNOSIS — M48062 Spinal stenosis, lumbar region with neurogenic claudication: Secondary | ICD-10-CM | POA: Diagnosis not present

## 2018-09-06 ENCOUNTER — Ambulatory Visit: Payer: Self-pay | Admitting: *Deleted

## 2018-09-06 NOTE — Telephone Encounter (Signed)
fyi

## 2018-09-06 NOTE — Telephone Encounter (Signed)
Patient phoned with SOB and wheezing for one week. Stated he has COPD uses symbicort BID and recently using his rescue inhaler. Reports the symptoms get worse as he exerts himself. Denies CP/dizziness. Has a productive cough, denies any fever. No PCP availability today.Refused care for today at other [facility] practice stating "tomorrow is good" Appointment made with PCP for tomorrow. Reviewed symptoms requiring immediate emergency evaluation. Stated he understood.  Reason for Disposition . [1] Longstanding difficulty breathing (e.g., CHF, COPD, emphysema) AND [2] WORSE than normal  Answer Assessment - Initial Assessment Questions 1. RESPIRATORY STATUS: "Describe your breathing?" (e.g., wheezing, shortness of breath, unable to speak, severe coughing)      Wheezing, short of breath with talking. 2. ONSET: "When did this breathing problem begin?"      One week. 3. PATTERN "Does the difficult breathing come and go, or has it been constant since it started?"      Comes and goes with exertion 4. SEVERITY: "How bad is your breathing?" (e.g., mild, moderate, severe)    - MILD: No SOB at rest, mild SOB with walking, speaks normally in sentences, can lay down, no retractions, pulse < 100.    - MODERATE: SOB at rest, SOB with minimal exertion and prefers to sit, cannot lie down flat, speaks in phrases, mild retractions, audible wheezing, pulse 100-120.    - SEVERE: Very SOB at rest, speaks in single words, struggling to breathe, sitting hunched forward, retractions, pulse > 120      sever 5. RECURRENT SYMPTOM: "Have you had difficulty breathing before?" If so, ask: "When was the last time?" and "What happened that time?"       6. CARDIAC HISTORY: "Do you have any history of heart disease?" (e.g., heart attack, angina, bypass surgery, angioplasty)       7. LUNG HISTORY: "Do you have any history of lung disease?"  (e.g., pulmonary embolus, asthma, emphysema)     COPD 8. CAUSE: "What do you think is causing the  breathing problem?"     Bronchial 9. OTHER SYMPTOMS: "Do you have any other symptoms? (e.g., dizziness, runny nose, cough, chest pain, fever)     SOB,productive cough. 10. PREGNANCY: "Is there any chance you are pregnant?" "When was your last menstrual period?"       na 11. TRAVEL: "Have you traveled out of the country in the last month?" (e.g., travel history, exposures)       no  Protocols used: BREATHING DIFFICULTY-A-AH

## 2018-09-07 ENCOUNTER — Encounter: Payer: Self-pay | Admitting: Adult Health

## 2018-09-07 ENCOUNTER — Ambulatory Visit (INDEPENDENT_AMBULATORY_CARE_PROVIDER_SITE_OTHER): Payer: Medicare Other | Admitting: Adult Health

## 2018-09-07 VITALS — BP 180/60 | HR 112 | Temp 98.1°F | Wt 171.8 lb

## 2018-09-07 DIAGNOSIS — J441 Chronic obstructive pulmonary disease with (acute) exacerbation: Secondary | ICD-10-CM

## 2018-09-07 DIAGNOSIS — J449 Chronic obstructive pulmonary disease, unspecified: Secondary | ICD-10-CM | POA: Diagnosis not present

## 2018-09-07 MED ORDER — DOXYCYCLINE HYCLATE 100 MG PO CAPS: 100.0000 mg | ORAL_CAPSULE | Freq: Two times a day (BID) | ORAL | 0 refills | Status: DC

## 2018-09-07 MED ORDER — ALBUTEROL SULFATE HFA 108 (90 BASE) MCG/ACT IN AERS: 2.0000 | INHALATION_SPRAY | Freq: Four times a day (QID) | RESPIRATORY_TRACT | 1 refills | Status: DC | PRN

## 2018-09-07 MED ORDER — IPRATROPIUM-ALBUTEROL 0.5-2.5 (3) MG/3ML IN SOLN
3.0000 mL | Freq: Once | RESPIRATORY_TRACT | Status: AC
  Administered 2018-09-07: 3 mL via RESPIRATORY_TRACT

## 2018-09-07 MED ORDER — PREDNISONE 10 MG PO TABS: ORAL_TABLET | ORAL | 0 refills | Status: DC

## 2018-09-07 NOTE — Progress Notes (Signed)
Subjective:    Patient ID: Chase Lloyd, male    DOB: 05-Sep-1956, 62 y.o.   MRN: [id number]  HPI  62 year old male who  has a past medical history of Back pain, COPD (chronic obstructive pulmonary disease) ([place]), DDD (degenerative disc disease), lumbar, Depression, Emphysema of lung ([place]), Empyema ([place]), GERD (gastroesophageal reflux disease), Hearing loss of right ear, Hodgkin's disease ([place]) (1999), Hypotension, Meniere's disease, Pneumonia, and Tinnitus.  He presents to the office today for one week of SOB and wheezing. He is using his rescue inhaler more frequently and Symbicort BID. Symptoms are worse when he exerts himself. He finds is difficult to dress himself without getting winded.   Review of Systems See HPI   Past Medical History:  Diagnosis Date  . Back pain   . COPD (chronic obstructive pulmonary disease) ([place])   . DDD (degenerative disc disease), lumbar   . Depression   . Emphysema of lung ([place])   . Empyema ([place])    2016  (was in [place])  . GERD (gastroesophageal reflux disease)   . Hearing loss of right ear    started 2001.     microphone in right ear ,  and hearing aid in left  . Hodgkin's disease ([place]) 1999  . Hypotension   . Meniere's disease   . Pneumonia   . Tinnitus     Social History   Socioeconomic History  . Marital status: Married    Spouse name: Not on file  . Number of children: Not on file  . Years of education: Not on file  . Highest education level: Not on file  Occupational History  . Occupation: Retired  Scientific laboratory technician  . Financial resource strain: Not on file  . Food insecurity:    Worry: Not on file    Inability: Not on file  . Transportation needs:    Medical: Not on file    Non-medical: Not on file  Tobacco Use  . Smoking status: Former Smoker    Packs/day: 1.50    Years: 47.00    Pack years: 70.50    Types: Cigarettes    Last attempt to quit: 06/13/2018    Years since quitting: 0.2  . Smokeless tobacco: Never Used    Substance and Sexual Activity  . Alcohol use: Yes    Comment: 24 cans in a week  . Drug use: No  . Sexual activity: Not on file  Lifestyle  . Physical activity:    Days per week: Not on file    Minutes per session: Not on file  . Stress: Not on file  Relationships  . Social connections:    Talks on phone: Not on file    Gets together: Not on file    Attends religious service: Not on file    Active member of club or organization: Not on file    Attends meetings of clubs or organizations: Not on file    Relationship status: Not on file  . Intimate partner violence:    Fear of current or ex partner: Not on file    Emotionally abused: Not on file    Physically abused: Not on file    Forced sexual activity: Not on file  Other Topics Concern  . Not on file  Social History Narrative   Retied - worked as Furniture conservator/restorer        Past Surgical History:  Procedure Laterality Date  . BACK SURGERY    . CERVICAL FUSION  2016  . ELBOW SURGERY    . EYE SURGERY    . LAMINECTOMY  1999  . lower back surgery    . TONSILLECTOMY      Family History  Problem Relation Age of Onset  . Hypertension Father   . Diabetes Father   . Testicular cancer Brother   . Melanoma Brother     No Known Allergies  Current Outpatient Medications on File Prior to Visit  Medication Sig Dispense Refill  . albuterol (PROVENTIL HFA;VENTOLIN HFA) 108 (90 Base) MCG/ACT inhaler Inhale 2 puffs into the lungs every 6 (six) hours as needed for wheezing or shortness of breath. 6.7 g 1  . ibuprofen (ADVIL,MOTRIN) 800 MG tablet Take 800 mg by mouth every 8 (eight) hours as needed (for pain.).     Marland Kitchen omeprazole (PRILOSEC) 40 MG capsule Take 1 capsule (40 mg total) by mouth daily. 90 capsule 3  . SYMBICORT 160-4.5 MCG/ACT inhaler Inhale 2 puffs into the lungs 2 (two) times daily. 1 Inhaler 11   No current facility-administered medications on file prior to visit.     BP (!) 180/60 (BP Location: Left Arm, Patient  Position: Sitting, Cuff Size: Normal)   Pulse (!) 112   Temp 98.1 F (36.7 C) (Oral)   Wt 171 lb 12.8 oz (77.9 kg)   SpO2 95%   BMI 24.65 kg/m       Objective:   Physical Exam  Constitutional: He is oriented to person, place, and time. He appears well-developed and well-nourished. No distress.  Cardiovascular: Normal rate, regular rhythm, normal heart sounds and intact distal pulses.  Pulmonary/Chest: Effort normal. No stridor. No respiratory distress. He has wheezes. He has rales. He exhibits no tenderness.  Neurological: He is alert and oriented to person, place, and time.  Skin: He is not diaphoretic.  Psychiatric: He has a normal mood and affect. His behavior is normal. Judgment and thought content normal.  Nursing note and vitals reviewed.     Assessment & Plan:  1. COPD exacerbation (HCC)  - ipratropium-albuterol (DUONEB) 0.5-2.5 (3) MG/3ML nebulizer solution 3 mL - predniSONE (DELTASONE) 10 MG tablet; 40 mg x 3 days, 20 mg x 3 days, 10 mg x 3 days  Dispense: 21 tablet; Refill: 0 - doxycycline (VIBRAMYCIN) 100 MG capsule; Take 1 capsule (100 mg total) by mouth 2 (two) times daily.  Dispense: 14 capsule; Refill: 0  - Patient endorsed improvement in breathing after nebulizer treatment. Wheezing was still present but rales has resolved - Follow up if not improved in the next 2-3 days  - Unable to get Xray at this time   2. COPD GOLD III  - albuterol (PROVENTIL HFA;VENTOLIN HFA) 108 (90 Base) MCG/ACT inhaler; Inhale 2 puffs into the lungs every 6 (six) hours as needed for wheezing or shortness of breath.  Dispense: 6.7 g; Refill: 1  [name], NP

## 2018-09-08 ENCOUNTER — Telehealth: Payer: Self-pay | Admitting: Adult Health

## 2018-09-08 ENCOUNTER — Telehealth: Payer: Self-pay | Admitting: Internal Medicine

## 2018-09-08 DIAGNOSIS — J449 Chronic obstructive pulmonary disease, unspecified: Secondary | ICD-10-CM

## 2018-09-08 NOTE — Telephone Encounter (Signed)
Called and spoke with patients wife, she stated that the patients copay will be cheaper for him to receive the generic version of Advair instead of him taking symibcort.   She would also like the patient to be placed on a nebulizer with nebulized medications.   MW please advise, thank you.

## 2018-09-08 NOTE — Telephone Encounter (Signed)
Called patient unable to reach left message to give us a call back.

## 2018-09-08 NOTE — Telephone Encounter (Signed)
Ok to give one sample of symbicort but the changes being considered cannot be done over the phone so needs to see me with all meds in hand and drug formulary before the meds run out

## 2018-09-08 NOTE — Telephone Encounter (Signed)
Copied from [place] [id number]. Topic: General - Other >> Sep 08, 2018  2:03 PM Lennox Solders wrote: Reason for CRM:pt wife is calling the pt was seen yesterday and still having sob. Pt borrowed his daughter nebulizer machine and would like new rx for nebulizer solution . [facility]. Pt insurance would not cover the proventil however they will cover albuterol HFA or  reva albuterol inhaler

## 2018-09-09 MED ORDER — SYMBICORT 160-4.5 MCG/ACT IN AERO: 2.0000 | INHALATION_SPRAY | Freq: Two times a day (BID) | RESPIRATORY_TRACT | 0 refills | Status: DC

## 2018-09-09 NOTE — Telephone Encounter (Signed)
ATC pt, no answer. Left message for pt to call back.  

## 2018-09-09 NOTE — Telephone Encounter (Signed)
Spoke with pt, advised message from Dr. Melvyn Novas. Pt verbalized understanding but refused to make an appt at this time. I advised him that I would leave sample at the front desk. Nothing further needed at this time

## 2018-09-10 ENCOUNTER — Other Ambulatory Visit: Payer: Self-pay | Admitting: Adult Health

## 2018-09-10 ENCOUNTER — Ambulatory Visit (INDEPENDENT_AMBULATORY_CARE_PROVIDER_SITE_OTHER)
Admission: RE | Admit: 2018-09-10 | Discharge: 2018-09-10 | Disposition: A | Discharge status: Home / Self Care | Payer: Medicare Other | Source: Ambulatory Visit | Attending: Pulmonary Disease | Admitting: Pulmonary Disease

## 2018-09-10 ENCOUNTER — Ambulatory Visit (INDEPENDENT_AMBULATORY_CARE_PROVIDER_SITE_OTHER): Payer: Medicare Other | Admitting: Pulmonary Disease

## 2018-09-10 ENCOUNTER — Encounter: Payer: Self-pay | Admitting: Pulmonary Disease

## 2018-09-10 VITALS — BP 148/88 | HR 108 | Temp 97.9°F | Ht 70.0 in | Wt 175.4 lb

## 2018-09-10 DIAGNOSIS — F17210 Nicotine dependence, cigarettes, uncomplicated: Secondary | ICD-10-CM

## 2018-09-10 DIAGNOSIS — J449 Chronic obstructive pulmonary disease, unspecified: Secondary | ICD-10-CM

## 2018-09-10 DIAGNOSIS — R05 Cough: Secondary | ICD-10-CM | POA: Diagnosis not present

## 2018-09-10 DIAGNOSIS — Z87891 Personal history of nicotine dependence: Secondary | ICD-10-CM | POA: Diagnosis not present

## 2018-09-10 DIAGNOSIS — K219 Gastro-esophageal reflux disease without esophagitis: Secondary | ICD-10-CM

## 2018-09-10 MED ORDER — BUDESONIDE-FORMOTEROL FUMARATE 160-4.5 MCG/ACT IN AERO: 2.0000 | INHALATION_SPRAY | Freq: Two times a day (BID) | RESPIRATORY_TRACT | 0 refills | Status: DC

## 2018-09-10 MED ORDER — TIOTROPIUM BROMIDE MONOHYDRATE 2.5 MCG/ACT IN AERS: 2.0000 | INHALATION_SPRAY | Freq: Every day | RESPIRATORY_TRACT | 2 refills | Status: DC

## 2018-09-10 MED ORDER — IPRATROPIUM-ALBUTEROL 0.5-2.5 (3) MG/3ML IN SOLN
3.0000 mL | Freq: Once | RESPIRATORY_TRACT | Status: AC
  Administered 2018-09-10: 3 mL via RESPIRATORY_TRACT

## 2018-09-10 MED ORDER — IPRATROPIUM-ALBUTEROL 0.5-2.5 (3) MG/3ML IN SOLN: 3.0000 mL | Freq: Four times a day (QID) | RESPIRATORY_TRACT | 3 refills | Status: DC | PRN

## 2018-09-10 MED ORDER — ALBUTEROL SULFATE (2.5 MG/3ML) 0.083% IN NEBU: 2.5000 mg | INHALATION_SOLUTION | Freq: Four times a day (QID) | RESPIRATORY_TRACT | 1 refills | Status: DC | PRN

## 2018-09-10 MED ORDER — AEROCHAMBER MV MISC: 0 refills | Status: DC

## 2018-09-10 MED ORDER — TIOTROPIUM BROMIDE MONOHYDRATE 2.5 MCG/ACT IN AERS: 2.0000 | INHALATION_SPRAY | Freq: Every day | RESPIRATORY_TRACT | 0 refills | Status: DC

## 2018-09-10 NOTE — Assessment & Plan Note (Addendum)
Continue Symbicort 160 >>> 2 puffs in the morning right when you wake up, rinse out your mouth after use, 12 hours later 2 puffs, rinse after use >>> Take this daily, no matter what >>> This is not a rescue inhaler   Restart Spiriva Respimat 2.5 >>> 2 puffs daily >>> Do this every day >>>This is not a rescue inhaler   Continue prednisone Continue doxycycline  DuoNeb nebulized medications prescribed today >>> If using more than 1-2 times a day please contact our office  Continue to use rescue inhaler Only use your albuterol as a rescue medication to be used if you can't catch your breath by resting or doing a relaxed purse lip breathing pattern.  - The less you use it, the better it will work when you need it. - Ok to use up to 2 puffs  every 4 hours if you must but call for immediate appointment if use goes up over your usual need - Don't leave home without it !!  (think of it like the spare tire for your car)   Chest x-ray today  Note your daily symptoms > remember "red flags" for COPD:   >>>Increase in cough >>>increase in sputum production >>>increase in shortness of breath or activity  intolerance.   If you notice these symptoms, please call the office to be seen.    Follow-up in 2 to 4 weeks with Dr. Melvyn Novas

## 2018-09-10 NOTE — Telephone Encounter (Signed)
Noted  

## 2018-09-10 NOTE — Patient Instructions (Addendum)
Continue Symbicort 160 >>> 2 puffs in the morning right when you wake up, rinse out your mouth after use, 12 hours later 2 puffs, rinse after use >>> Take this daily, no matter what >>> This is not a rescue inhaler   Restart Spiriva Respimat 2.5 >>> 2 puffs daily >>> Do this every day >>>This is not a rescue inhaler   Continue prednisone Continue doxycycline  DuoNeb nebulized medications prescribed today >>> If using more than 1-2 times a day please contact our office  Continue to use rescue inhaler Only use your albuterol as a rescue medication to be used if you can't catch your breath by resting or doing a relaxed purse lip breathing pattern.  - The less you use it, the better it will work when you need it. - Ok to use up to 2 puffs  every 4 hours if you must but call for immediate appointment if use goes up over your usual need - Don't leave home without it !!  (think of it like the spare tire for your car)   Chest x-ray today  Follow-up in 2 to 4 weeks with Dr. Melvyn Novas       November/2019 we will be moving! We will no longer be at our [place] location.  Be on the look out for a post card/mailer to let you know we have officially moved.  Our new address and phone number will be:  [address] [place], [place] [address] Telephone number: [phone]  It is flu season:   >>>Remember to be washing your hands regularly, using hand sanitizer, be careful to use around herself with has contact with people who are sick will increase her chances of getting sick yourself. >>> Best ways to protect herself from the flu: Receive the yearly flu vaccine, practice good hand hygiene washing with soap and also using hand sanitizer when available, eat a nutritious meals, get adequate rest, hydrate appropriately   Please contact the office if your symptoms worsen or you have concerns that you are not improving.   Thank you for choosing [facility] for your healthcare, and  for allowing [name] to partner with you on your healthcare journey. I am thankful to be able to provide care to you today.   Wyn Quaker FNP-C

## 2018-09-10 NOTE — Progress Notes (Signed)
@Patient  ID: Chase Lloyd, male    DOB: 09-13-1956, 62 y.o.   MRN: [id number]  Chief Complaint  Patient presents with  . Acute Visit    Cough, SOB, wants generic advair     Referring provider: [name], NP  HPI:  62 year old male active smoker followed in our office for Gold III COPD  PMH:  Smoker/ Smoking History: Former smoker.  70-pack-year smoking history. Maintenance: Symbicort 160 Pt of: Dr. Melvyn Novas  Recent [place] [facility] Encounters:   04/28/18-office visit-Wert 62 year old male active smoker initially referred to our office on 05/29/2017 by PCP with COPD. Acute visit increased shortness of breath for 2 months.  mMRC 2.  He is a rescue inhaler once every other day.  Already on doxycycline as well as prednisone by PCP.  Taking Prilosec.  Has been off Spiriva for months not convinced it makes any difference. Plan: Finish antibiotics, if not improving will add back Spiriva to Symbicort 160 work on smoking cessation  09/10/2018  - Visit   62 year old male patient presenting today for acute visit.  Patient reports that he was recently started on doxycycline as well as prednisone from primary care.  Patient is interested in switching his Symbicort 160 inhaler to the generic Advair.  Patient is wondering if we could do that today.  Patient is also requesting DuoNeb nebulized medications as well.  Patient is here with his daughter.  Patient and daughter both endorse the fact that he has had increased shortness of breath over the last 2 weeks.  Patient reporting that he has used his rescue inhaler 1-2 times a day.  Patient does not feel like that is started to improve in his breathing yet after starting antibiotics or prednisone.  Patient was started on this therapy 09/08/2018.  Patient also endorses a cough which is productive with white/yellow to thick mucus.  He does not endorse increased sputum production.  Patient also reports increased wheezing.  And fatigue.  Patient denies  recorded temperatures.  Admits that he has had a few nights where when he sleeping he feels quite hot.   MMRC - Breathlessness Score 3 - I stop for breath after walking about 100 yards or after a few minutes on level ground (isle at grocery store is 138ft)    Tests:  07/30/2017- pulmonary function test- FVC 2.45 (50% predicted), postbronchodilator ratio 48, FEV1 38, positive bronchodilator response, DLCO 42 >>>Severe obstructive airways disease >>>Severe diffusion defect  FENO:  No results found for: NITRICOXIDE  PFT: PFT Results Latest Ref Rng & Units 07/30/2017  FVC-Pre L 2.45  FVC-Predicted Pre % 50  FVC-Post L 2.90  FVC-Predicted Post % 59  Pre FEV1/FVC % % 46  Post FEV1/FCV % % 48  FEV1-Pre L 1.13  FEV1-Predicted Pre % 31  DLCO UNC% % 39  DLCO COR %Predicted % 59    Imaging: Dg Chest 2 View  Result Date: 09/10/2018 CLINICAL DATA:  Cough, congestion and Wheezing x weeks. Hx of bilateral pna, COPD, emphysema. Ex smoker. EXAM: CHEST - 2 VIEW COMPARISON:  07/14/2018 and older exams. FINDINGS: Hazy right lung base opacity has increased from prior exam. There are persistent prominent bronchovascular markings most evident the lower lungs, right greater than left. Lobe scarring, right lateral upper lobe both lung apices, is stable. There is persistent scarring at the base of the right middle lobe. Cardiac silhouette is normal in size. No mediastinal or hilar masses. No convincing adenopathy. Skeletal structures intact. IMPRESSION: 1. Hazy opacity projects at  the right lung base, increased when compared to the prior radiographs, which may reflect pneumonia or atelectasis. 2. No other change. Chronic thickening of the bronchovascular markings and areas of chronic scarring. Electronically Signed   By: Lajean Manes M.D.   On: 09/10/2018 11:06    Chart Review:   Chart review 09/07/2018-COPD exacerbation-PCP Plan: Doxycycline, prednisone, DuoNeb nebulized medications   Specialty  Problems      Pulmonary Problems   COPD GOLD III D    Quit smoking 03/2017 -  Spirometry 05/29/2017  FEV1 1.37 (39%)  Ratio 47 p am symb x2 and spirva x one and abn effort dep portion  - 05/29/2017   > rec  change  spiriva to  respimat and symb 160 2bid s spacer   - PFT's  07/30/2017  FEV1 1.39 (38 % ) ratio 48  p 23 % improvement from saba p nothing prior to study with DLCO  39/42 % corrects to 59 % for alv volume   - 04/28/2018  After extensive coaching inhaler device  effectiveness =    75% with hfa (short Ti)          No Known Allergies  Immunization History  Administered Date(s) Administered  . Influenza,inj,Quad PF,6+ Mos 12/27/2015  . Pneumococcal Conjugate-13 12/26/2015  . Td 01/01/2014  . Zoster 08/04/2015   Consider flu vaccine as well as Pneumovax at next office visit  Past Medical History:  Diagnosis Date  . Back pain   . COPD (chronic obstructive pulmonary disease) ([place])   . DDD (degenerative disc disease), lumbar   . Depression   . Emphysema of lung ([place])   . Empyema ([place])    2016  (was in [place])  . GERD (gastroesophageal reflux disease)   . Hearing loss of right ear    started 2001.     microphone in right ear ,  and hearing aid in left  . Hodgkin's disease ([place]) 1999  . Hypotension   . Meniere's disease   . Pneumonia   . Tinnitus     Tobacco History: Social History   Tobacco Use  Smoking Status Former Smoker  . Packs/day: 1.50  . Years: 47.00  . Pack years: 70.50  . Types: Cigarettes  . Last attempt to quit: 06/13/2018  . Years since quitting: 0.2  Smokeless Tobacco Never Used   Counseling given: Yes  Patient stopped smoking in August 02/2018.  Referral to lung cancer screening program today. Pt agrees.   Outpatient Encounter Medications as of 09/10/2018  Medication Sig  . doxycycline (VIBRAMYCIN) 100 MG capsule Take 1 capsule (100 mg total) by mouth 2 (two) times daily.  Marland Kitchen ibuprofen (ADVIL,MOTRIN) 800 MG tablet Take 800 mg by mouth every 8  (eight) hours as needed (for pain.).   Marland Kitchen omeprazole (PRILOSEC) 40 MG capsule Take 1 capsule (40 mg total) by mouth daily.  . predniSONE (DELTASONE) 10 MG tablet 40 mg x 3 days, 20 mg x 3 days, 10 mg x 3 days  . SYMBICORT 160-4.5 MCG/ACT inhaler Inhale 2 puffs into the lungs 2 (two) times daily.  . [DISCONTINUED] albuterol (PROVENTIL HFA;VENTOLIN HFA) 108 (90 Base) MCG/ACT inhaler Inhale 2 puffs into the lungs every 6 (six) hours as needed for wheezing or shortness of breath.  . budesonide-formoterol (SYMBICORT) 160-4.5 MCG/ACT inhaler Inhale 2 puffs into the lungs every 12 (twelve) hours.  Marland Kitchen ipratropium-albuterol (DUONEB) 0.5-2.5 (3) MG/3ML SOLN Take 3 mLs by nebulization every 6 (six) hours as needed. J44.9  . Tiotropium  Bromide Monohydrate (SPIRIVA RESPIMAT) 2.5 MCG/ACT AERS Inhale 2 puffs into the lungs daily.  . [DISCONTINUED] Spacer/Aero-Holding Chambers (AEROCHAMBER MV) inhaler Use as instructed  . [DISCONTINUED] Tiotropium Bromide Monohydrate (SPIRIVA RESPIMAT) 2.5 MCG/ACT AERS Inhale 2 puffs into the lungs daily.  . [EXPIRED] ipratropium-albuterol (DUONEB) 0.5-2.5 (3) MG/3ML nebulizer solution 3 mL    No facility-administered encounter medications on file as of 09/10/2018.      Review of Systems  Review of Systems  Constitutional: Positive for fatigue and fever (subjective fevers no recorded temps). Negative for activity change, chills and unexpected weight change.  HENT: Negative for postnasal drip, rhinorrhea, sinus pressure, sinus pain, sneezing and sore throat.   Eyes: Negative.   Respiratory: Positive for cough (yellow mucous ), shortness of breath and wheezing.   Cardiovascular: Negative for chest pain and palpitations.  Gastrointestinal: Negative for constipation, diarrhea, nausea and vomiting.  Endocrine: Negative.   Musculoskeletal: Negative.   Skin: Negative.   Neurological: Negative for dizziness and headaches.  Psychiatric/Behavioral: Negative.  Negative for dysphoric  mood. The patient is not nervous/anxious.   All other systems reviewed and are negative.    Physical Exam  BP (!) 148/88 (BP Location: Left Arm, Cuff Size: Normal)   Pulse (!) 108   Temp 97.9 F (36.6 C) (Oral)   Ht 5\' 10"  (1.778 m)   Wt 175 lb 6.4 oz (79.6 kg)   SpO2 95%   BMI 25.17 kg/m   Wt Readings from Last 5 Encounters:  09/10/18 175 lb 6.4 oz (79.6 kg)  09/07/18 171 lb 12.8 oz (77.9 kg)  07/18/18 170 lb (77.1 kg)  07/17/18 170 lb 10.2 oz (77.4 kg)  07/14/18 170 lb 10.2 oz (77.4 kg)    Physical Exam  Constitutional: He is oriented to person, place, and time and well-developed, well-nourished, and in no distress. No distress.  HENT:  Head: Normocephalic and atraumatic.  Right Ear: Hearing, tympanic membrane, external ear and ear canal normal.  Left Ear: Hearing, tympanic membrane, external ear and ear canal normal.  Nose: Nose normal. Right sinus exhibits no maxillary sinus tenderness and no frontal sinus tenderness. Left sinus exhibits no maxillary sinus tenderness and no frontal sinus tenderness.  Mouth/Throat: Uvula is midline and oropharynx is clear and moist. No oropharyngeal exudate.  Eyes: Pupils are equal, round, and reactive to light.  Neck: Normal range of motion. Neck supple. No JVD present.  Cardiovascular: Regular rhythm and normal heart sounds. Tachycardia present.  Pulmonary/Chest: Effort normal. No accessory muscle usage. No respiratory distress. He has no decreased breath sounds. He has wheezes (insp and exp wheeze ). He has no rhonchi. He has rales.  Abdominal: Soft. Bowel sounds are normal. There is no tenderness.  Musculoskeletal: Normal range of motion. He exhibits no edema.  Back brace in place  Lymphadenopathy:    He has no cervical adenopathy.  Neurological: He is alert and oriented to person, place, and time. Gait normal.  Skin: Skin is warm and dry. He is not diaphoretic. No erythema.  Psychiatric: Mood, memory, affect and judgment normal.    Nursing note and vitals reviewed.   Breathing treatment in office - duo neb Resolved inspiratory wheeze, persistent but improved left lower lobe expiratory wheeze  Lab Results:  CBC    Component Value Date/Time   WBC 7.3 07/18/2018 0918   RBC 2.81 (L) 07/18/2018 0918   HGB 9.2 (L) 07/18/2018 0918   HCT 29.0 (L) 07/18/2018 0918   PLT 93 (L) 07/18/2018 0918   MCV  103.2 (H) 07/18/2018 0918   MCH 32.7 07/18/2018 0918   MCHC 31.7 07/18/2018 0918   RDW 13.2 07/18/2018 0918   LYMPHSABS 0.7 07/18/2018 0918   MONOABS 0.6 07/18/2018 0918   EOSABS 0.0 07/18/2018 0918   BASOSABS 0.0 07/18/2018 0918    BMET    Component Value Date/Time   NA 136 07/18/2018 0918   K 3.6 07/18/2018 0918   CL 99 07/18/2018 0918   CO2 29 07/18/2018 0918   GLUCOSE 109 (H) 07/18/2018 0918   BUN 11 07/18/2018 0918   CREATININE 1.27 (H) 07/18/2018 0918   CALCIUM 8.4 (L) 07/18/2018 0918   GFRNONAA 58 (L) 07/18/2018 0918   GFRAA >60 07/18/2018 0918    BNP No results found for: BNP  ProBNP No results found for: PROBNP    Assessment & Plan:   62 year old male patient completing follow-up with our office today.  I have discussed with the patient changing inhalers.  Patient and I both agreed to continue on Symbicort 160.  I do not believe that he would benefit from the dry powder inhaler that will would be the air duo.  Patient agrees.  Will restart Spiriva Respimat 2.5.  Patient reports that he failed Spiriva when it was a [employer]er.  Patient continue on prednisone and doxycycline as prescribed by primary care.  Patient to follow-up in 2 to 4 weeks with Dr. Melvyn Novas.  Chest x-ray today.  Referred to lung cancer screening program today.  We will also prescribe DuoNeb nebulized medications.  If patient has to use more than 1-2 times a day need to contact our office.  Discussed how to monitor for anticholinergic side effects.  Patient agrees.  Could consider Daliresp in the future to help prevent future  exacerbations.  As patient is on triple therapy and is a former smoker with an FEV1 less than 50.  Patient refused spacer today.  COPD GOLD III D Continue Symbicort 160 >>> 2 puffs in the morning right when you wake up, rinse out your mouth after use, 12 hours later 2 puffs, rinse after use >>> Take this daily, no matter what >>> This is not a rescue inhaler   Restart Spiriva Respimat 2.5 >>> 2 puffs daily >>> Do this every day >>>This is not a rescue inhaler   Continue prednisone Continue doxycycline  DuoNeb nebulized medications prescribed today >>> If using more than 1-2 times a day please contact our office  Continue to use rescue inhaler Only use your albuterol as a rescue medication to be used if you can't catch your breath by resting or doing a relaxed purse lip breathing pattern.  - The less you use it, the better it will work when you need it. - Ok to use up to 2 puffs  every 4 hours if you must but call for immediate appointment if use goes up over your usual need - Don't leave home without it !!  (think of it like the spare tire for your car)   Chest x-ray today  Note your daily symptoms > remember "red flags" for COPD:   >>>Increase in cough >>>increase in sputum production >>>increase in shortness of breath or activity  intolerance.   If you notice these symptoms, please call the office to be seen.    Follow-up in 2 to 4 weeks with Dr. Melvyn Novas   Former cigarette smoker - 70 pack year hx Continue to not smoke  We will refer you today to her 73 pack year lung cancer screening  program >>>This is based off of your pack-year smoking history >>> This is a recommendation from the [name] preventative services task force (USPSTF) >>>The USPSTF recommends annual screening for lung cancer with low-dose computed tomography (LDCT) in adults aged 67 to 74 years who have a 30 pack-year smoking history and currently smoke or have quit within the past 15 years. Screening should be  discontinued once a person has not smoked for 15 years or develops a health problem that substantially limits life expectancy or the ability or willingness to have curative lung surgery.   Our office will call you and set up an appointment with Eric Form (Nurse Practitioner) who leads this program.  This appointment takes place in our office.  After completing this meeting with Eric Form NP you will get a low-dose CT as the screening >>>We will call you with those results    GERD (gastroesophageal reflux disease) Continue with omeprazole     Lauraine Rinne, NP 09/10/2018

## 2018-09-10 NOTE — Telephone Encounter (Signed)
Okay for refill? Please advise 

## 2018-09-10 NOTE — Assessment & Plan Note (Signed)
Continue to not smoke  We will refer you today to her 70 pack year lung cancer screening program >>>This is based off of your pack-year smoking history >>> This is a recommendation from the [name] preventative services task force (USPSTF) >>>The USPSTF recommends annual screening for lung cancer with low-dose computed tomography (LDCT) in adults aged 64 to 80 years who have a 30 pack-year smoking history and currently smoke or have quit within the past 15 years. Screening should be discontinued once a person has not smoked for 15 years or develops a health problem that substantially limits life expectancy or the ability or willingness to have curative lung surgery.   Our office will call you and set up an appointment with Eric Form (Nurse Practitioner) who leads this program.  This appointment takes place in our office.  After completing this meeting with Eric Form NP you will get a low-dose CT as the screening >>>We will call you with those results

## 2018-09-10 NOTE — Progress Notes (Signed)
Potential RLL pneumonia. Continue with prednisone and doxycycline.  Will need repeat chest x-ray at next follow-up visit.  Please place the order stat.  Please complete this chest x-ray prior to your appointment with Dr. Melvyn Novas, so arrive early.   Wyn Quaker FNP

## 2018-09-10 NOTE — Assessment & Plan Note (Signed)
Continue with omeprazole

## 2018-09-10 NOTE — Telephone Encounter (Signed)
Albuterol nebulizer solution sent in. He will need to follow up with pulmonary for further refills

## 2018-09-10 NOTE — Addendum Note (Signed)
Addended by: Maryanna Shape A on: 09/10/2018 01:49 PM   Modules accepted: Orders

## 2018-09-13 ENCOUNTER — Other Ambulatory Visit: Payer: Self-pay | Admitting: Acute Care

## 2018-09-13 DIAGNOSIS — Z87891 Personal history of nicotine dependence: Secondary | ICD-10-CM

## 2018-09-13 DIAGNOSIS — Z122 Encounter for screening for malignant neoplasm of respiratory organs: Secondary | ICD-10-CM

## 2018-09-15 NOTE — Progress Notes (Signed)
Chart and office note reviewed in detail  > agree with a/p as outlined    

## 2018-09-21 ENCOUNTER — Ambulatory Visit (INDEPENDENT_AMBULATORY_CARE_PROVIDER_SITE_OTHER): Payer: Medicare Other | Admitting: Internal Medicine

## 2018-09-21 ENCOUNTER — Encounter: Payer: Self-pay | Admitting: Internal Medicine

## 2018-09-21 ENCOUNTER — Ambulatory Visit: Payer: Medicare Other | Admitting: Internal Medicine

## 2018-09-21 DIAGNOSIS — I10 Essential (primary) hypertension: Secondary | ICD-10-CM

## 2018-09-21 DIAGNOSIS — J449 Chronic obstructive pulmonary disease, unspecified: Secondary | ICD-10-CM | POA: Diagnosis not present

## 2018-09-21 MED ORDER — PREDNISONE 10 MG PO TABS: ORAL_TABLET | ORAL | 0 refills | Status: DC

## 2018-09-21 MED ORDER — TIOTROPIUM BROMIDE MONOHYDRATE 1.25 MCG/ACT IN AERS: 4.0000 | INHALATION_SPRAY | Freq: Every day | RESPIRATORY_TRACT | 0 refills | Status: DC

## 2018-09-21 MED ORDER — LEVOFLOXACIN 500 MG PO TABS: 500.0000 mg | ORAL_TABLET | Freq: Every day | ORAL | 0 refills | Status: DC

## 2018-09-21 MED ORDER — ALBUTEROL SULFATE (2.5 MG/3ML) 0.083% IN NEBU: 2.5000 mg | INHALATION_SOLUTION | Freq: Four times a day (QID) | RESPIRATORY_TRACT | 3 refills | Status: DC | PRN

## 2018-09-21 NOTE — Patient Instructions (Addendum)
Plan A = Automatic = symbicort 160 Take 2 puffs first thing in am and then another 2 puffs about 12 hours later ok to use spirviva in evening = 1.25 (blue top) x 4 or 2.5 (green) x 2   Plan B = Backup Only use your albuterol inhaler as a rescue medication to be used if you can't catch your breath by resting or doing a relaxed purse lip breathing pattern.  - The less you use it, the better it will work when you need it. - Ok to use the inhaler up to 2 puffs  every 4 hours if you must but call for appointment if use goes up over your usual need - Don't leave home without it !!  (think of it like the spare tire for your car)   Plan C = Crisis - only use your albuterol nebulizer if you first try Plan B and it fails to help > ok to use the nebulizer up to every 4 hours but if start needing it regularly call for immediate appointment   Plan D = Doctor - call me if B and C not adequate  Plan E = ER - go to ER or call 911 if all else fails     Prednisone  10 mg Take 4 for three days 3 for three days 2 for three days 1 for three days and stop   Levaquin 500 mg one daily x 7 days - stop if aches in tendons   While coughing / wheezing > prilosec 40 mg Take 30- 60 min before your first and last meals of the day   GERD (REFLUX)  is an extremely common cause of respiratory symptoms just like yours , many times with no obvious heartburn at all.    It can be treated with medication, but also with lifestyle changes including elevation of the head of your bed (ideally with 6 inch  bed blocks),  Smoking cessation, avoidance of late meals, excessive alcohol, and avoid fatty foods, chocolate, peppermint, colas, red wine, and acidic juices such as orange juice.  NO MINT OR MENTHOL PRODUCTS SO NO COUGH DROPS   USE SUGARLESS CANDY INSTEAD (Jolley ranchers or Stover's or Life Savers) or even ice chips will also do - the key is to swallow to prevent all throat clearing. NO OIL BASED VITAMINS - use powdered  substitutes.   Please schedule a follow up office visit in 4 weeks, sooner if needed  with all medications /inhalers/ solutions in hand so we can verify exactly what you are taking. This includes all medications from all doctors and over the counters  - PFTs and on cxr  on return

## 2018-09-21 NOTE — Progress Notes (Signed)
Subjective:   Patient ID: Chase Lloyd, male    DOB: [birth year],     MRN: [id number]      Brief patient profile:  53 yowm quit smoking 06/2018  from  [facility]  first aware of doe 2013 dx as copd rx symbicort helped a lot then pna/ sepsis Feb 2017 and added spiriva and rx 02 short term and moved to [place] may 2018 and referred to pulmonary clinic 05/29/2017 by [name] with GOLD III criteria at initial eval    History of Present Illness  05/29/2017 1st  Pulmonary office visit/ October Peery  Maint rx symb 160 2bid / spiriva dpi  Chief Complaint  Patient presents with  . Pulmonary Consult    Pt referred by Dr. [name] for COPD. Pt c/o DOE, prod cough with yellow mucus. Pt denies CP/tightness and f/c/s. Pt states he rarely uses his albuterol hfa.   ok at rest, sleeps fine with improving cough since quit smoking  Left leg swelling x months assoc with hip pain > ortho following with 'neg sonogram"  per pt  rec Plan A = Automatic = Symbicort 160 Take 2 puffs first thing in am and then another 2 puffs about 12 hours later and spiriva 2 puffs in am only (ok to use up the powder form if you want)  Work on inhaler technique:  relax and gently blow all the way out then take a nice smooth deep breath back in, triggering the inhaler at same time you start breathing in.  Hold for up to 5 seconds if you can. Blow out thru nose. Rinse and gargle with water when done Plan B = Backup Only use your albuterol as a rescue medication   07/30/2017  f/u ov/Jazilyn Siegenthaler re:  GOLD III with reversibility / symb 160/spiriva smi Chief Complaint  Patient presents with  . Follow-up    PFT's done today. His breathing is doing well. He has not had to use his rescue inhaler. No new co's today.   Not limited by breathing from desired activities but  Due to back and hip and leg issues  Sleeping fine/ no noct saba or am flares rec Work on maintaining perfect inhaler technique:  relax and gently blow all the way out then take  a nice smooth deep breath back in, triggering the inhaler at same time you start breathing in.    04/28/2018 acute extended ov/Bettie Capistran re:  GOLD III/  Chief Complaint  Patient presents with  . Acute Visit    increased SOB x 2 months. He also c/o occ cough- prod at times with yellow sputum. He states he was just told by PCP today that he had bronchitis based on recent cxr 04/26/18. He is using his albuterol inhaler about every other day.   dyspnea gradually worse x 2 months to point = .MMRC2 = can't walk a nl pace on a flat grade s sob but does fine slow and flat but also limited back and L hip pain 02 none  saba qod  Dysphagia x years on prilosec q am but not ac  Off spiriva x months not convinced made any difference Already on pred/doxy per PCP since 04/26/18  rec Try taking omeprazole Take 30-60 min before first meal of the day  GERD diet   Plan A = Automatic = Symbicort  160 Take 2 puffs first thing in am and then another 2 puffs about 12 hours later.  Plan B = Backup Only use your albuterol  as a rescue medication   Finish the prednisone and antibiotic arleady prescribed  The key is to stop smoking completely before smoking completely stops you!  Please schedule a follow up visit in 3 months but call sooner if needed pft's > not done       09/10/18 NP eval/ aecopd aecopd x one week rec Continue Symbicort 160  Restart Spiriva Respimat 2.5 Continue prednisone Continue doxycycline DuoNeb nebulized medications prescribed today >>> If using more than 1-2 times a day please contact our office    09/21/2018 extended f/u  ov/Alexei Doswell re:  GOLD III/ group D with refractory sob  Chief Complaint  Patient presents with  . Follow-up    Breathing is worse x 2 days. He is winded just walking from room to room at home. He is not sleeping well. He is having a right temporal HA.  He is coughing with yellow sputum.    Dyspnea:  Room to room, neb helps the most  Cough: thick yellow  Esp in am    Sleeping: poorly due to sob one pillow either side  SABA use: both hfa and neb now but uses grandson's neb / hfa poor  02: no 02    No obvious day to day or daytime variability or assoc   mucus plugs or hemoptysis or cp or chest tightness, subjective wheeze or overt sinus or hb symptoms.     Also denies any obvious fluctuation of symptoms with weather or environmental changes or other aggravating or alleviating factors except as outlined above   No unusual exposure hx or h/o childhood pna/ asthma or knowledge of premature birth.  Current Allergies, Complete Past Medical History, Past Surgical History, Family History, and Social History were reviewed in [employer] record.  ROS  The following are not active complaints unless bolded Hoarseness, sore throat, dysphagia chronic esp with pills, dental problems, itching, sneezing,  nasal congestion or discharge of excess mucus or purulent secretions, ear ache,   fever, chills, sweats, unintended wt loss or wt gain, classically pleuritic or exertional cp,  orthopnea pnd or arm/hand swelling  or leg swelling, presyncope, palpitations, abdominal pain, anorexia, nausea, vomiting, diarrhea  or change in bowel habits or change in bladder habits, change in stools or change in urine, dysuria, hematuria,  rash, arthralgias, visual complaints, headache rad down neck, helps to rub the neck and temporal muscles, numbness, weakness or ataxia or problems with walking or coordination,  change in mood or  memory.        Current Meds  Medication Sig  . budesonide-formoterol (SYMBICORT) 160-4.5 MCG/ACT inhaler Inhale 2 puffs into the lungs every 12 (twelve) hours.  Marland Kitchen ibuprofen (ADVIL,MOTRIN) 800 MG tablet Take 800 mg by mouth every 8 (eight) hours as needed (for pain.).   Marland Kitchen omeprazole (PRILOSEC) 40 MG capsule Take 1 capsule (40 mg total) by mouth daily.  .     . Tiotropium Bromide Monohydrate (SPIRIVA RESPIMAT) 2.5 MCG/ACT AERS Inhale 2 puffs  into the lungs daily.  . [ ]  ipratropium-albuterol (DUONEB) 0.5-2.5 (3) MG/3ML SOLN Take 3 mLs by nebulization every 6 (six) hours as needed. J44.9                      Objective:   Physical Exam  Amb wm nad     09/21/2018      179  04/28/2018        173  07/30/2017        173  05/29/17 168 lb (76.2 kg)  05/01/17 164 lb (74.4 kg)  04/15/17 162 lb (73.5 kg)     Vital signs reviewed - Note on arrival 02 sats  95% on RA  And note bp 178/64    HEENT full dentures,  Mild non-specific swelling of  turbinates bilaterally Nl external ear canals without cough reflex    NECK :  without JVD/Nodes/TM/ nl carotid upstrokes bilaterally   LUNGS: no acc muscle use,  Mod barrel  contour chest wall with bilateral  Distant bs s audible wheeze and  without cough on insp or exp maneuver and mod  Hyperresonant  to  percussion bilaterally     CV:  RRR  no s3 or murmur or increase in P2, and trace bilateral ankle edema   ABD:  soft and nontender with pos mid insp Hoover's  in the supine position. No bruits or organomegaly appreciated, bowel sounds nl  MS:   Nl gait/  ext warm without deformities, calf tenderness, cyanosis or clubbing No obvious joint restrictions   SKIN: warm and dry without lesions    NEURO:  alert, approp, nl sensorium with  no motor or cerebellar deficits apparent.           I personally reviewed images and agree with radiology impression as follows:  CXR:   09/10/18  1. Hazy opacity projects at the right lung base, increased when compared to the prior radiographs, which may reflect pneumonia or atelectasis. 2. No other change. Chronic thickening of the bronchovascular markings and areas of chronic scarring.      Assessment & Plan:

## 2018-09-22 ENCOUNTER — Encounter: Payer: Self-pay | Admitting: Internal Medicine

## 2018-09-22 DIAGNOSIS — I10 Essential (primary) hypertension: Secondary | ICD-10-CM | POA: Insufficient documentation

## 2018-09-22 NOTE — Assessment & Plan Note (Addendum)
Has isolated systolic hbp with relatively low diastolic in setting of resp difficulty so no need to rx but f/u with PCP rec     I had an extended discussion with the patient/wife reviewing all relevant studies completed to date and  lasting 25 minutes of a 40  minute office  visit addressing severe non-specific but potentially very serious refractory respiratory symptoms of uncertain and potentially multiple  Etiologies.  See device teaching which extended face to face time for this visit    Each maintenance medication was reviewed in detail including most importantly the difference between maintenance and prns and under what circumstances the prns are to be triggered using an action plan format that is not reflected in the computer generated alphabetically organized AVS.    Please see AVS for specific instructions unique to this office visit that I personally wrote and verbalized to the the pt in detail and then reviewed with pt  by my nurse highlighting any changes in therapy/plan of care  recommended at today's visit.

## 2018-09-22 NOTE — Assessment & Plan Note (Addendum)
Quit smoking 03/2017 -  Spirometry 05/29/2017  FEV1 1.37 (39%)  Ratio 47 p am symb x2 and spirva x one and abn effort dep portion  - 05/29/2017   > rec  change  spiriva to  respimat and symb 160 2bid s spacer   - PFT's  07/30/2017  FEV1 1.39 (38 % ) ratio 48  p 23 % improvement from saba p nothing prior to study with DLCO  39/42 % corrects to 59 % for alv volume    - 09/21/2018  After extensive coaching inhaler device,  effectiveness =    75% (short Ti)       DDX of  difficult airways management almost all start with A and  include Adherence, Ace Inhibitors, Acid Reflux, Active Sinus Disease, Alpha 1 Antitripsin deficiency, Anxiety masquerading as Airways dz,  ABPA,  Allergy(esp in young), Aspiration (esp in elderly), Adverse effects of meds,  Active smoking or vaping, A bunch of PE's (a small clot burden can't cause this syndrome unless there is already severe underlying pulm or vascular dz with poor reserve) plus two Bs  = Bronchiectasis and Beta blocker use..and one C= CHF   Adherence is always the initial "prime suspect" and is a multilayered concern that requires a "trust but verify" approach in every patient - starting with knowing how to use medications, especially inhalers, correctly, keeping up with refills and understanding the fundamental difference between maintenance and prns vs those medications only taken for a very short course and then stopped and not refilled.  - see hfa teaching - return with all meds in hand using a trust but verify approach to confirm accurate Medication  Reconciliation The principal here is that until we are certain that the  patients are doing what we've asked, it makes no sense to ask them to do more.   ? Allergy/asthma component > repeat prednisone but this time x 12 days  ? Active sinus dz or pna > levaquin 500 mg daily x 7 days then call if not better for ? Sinus and Chest ct   ? Acid (or non-acid) GERD > always difficult to exclude as up to 75% of pts in  some series report no assoc GI/ Heartburn symptoms> rec max (24h)  acid suppression and diet restrictions/ reviewed and instructions given in writing.   ? Active smoking > assures me still not smoking/ re-enforced impt for breathing clean air   ? chf > he has mild systolic hbp but nothing else to suggest cardiac asthma her.    >>>> Rec  Group D in terms of symptom/risk and laba/lama/ICS  therefore appropriate rx at this point but does not need both lama and sama as risk anticholinergic side effects so d/c duoneb and just rx with lama and prn albtuterol, starting with the hfa then the neb as backup    F/u in 4 weeks, sooner if needed

## 2018-10-04 ENCOUNTER — Encounter: Payer: Self-pay | Admitting: Acute Care

## 2018-10-04 ENCOUNTER — Ambulatory Visit (INDEPENDENT_AMBULATORY_CARE_PROVIDER_SITE_OTHER)
Admission: RE | Admit: 2018-10-04 | Discharge: 2018-10-04 | Disposition: A | Discharge status: Home / Self Care | Payer: Medicare Other | Source: Ambulatory Visit | Attending: Acute Care | Admitting: Acute Care

## 2018-10-04 ENCOUNTER — Ambulatory Visit (INDEPENDENT_AMBULATORY_CARE_PROVIDER_SITE_OTHER): Payer: Medicare Other | Admitting: Acute Care

## 2018-10-04 VITALS — BP 158/70 | HR 117 | Ht 70.0 in | Wt 180.6 lb

## 2018-10-04 DIAGNOSIS — Z87891 Personal history of nicotine dependence: Secondary | ICD-10-CM

## 2018-10-04 DIAGNOSIS — Z122 Encounter for screening for malignant neoplasm of respiratory organs: Secondary | ICD-10-CM

## 2018-10-04 NOTE — Progress Notes (Signed)
Shared Decision Making Visit Lung Cancer Screening Program [phone])   Eligibility:  Age 62 y.o.  Pack Years Smoking History Calculation 46 pack year smoking history (# packs/per year x # years smoked)  Recent History of coughing up blood  no  Unexplained weight loss? no ( >Than 15 pounds within the last 6 months )  Prior History Lung / other cancer no (Diagnosis within the last 5 years already requiring surveillance chest CT Scans).  Smoking Status Former Smoker  Former Smokers: Years since quit: < 1 year  Quit Date: 06/2018  Visit Components:  Discussion included one or more decision making aids. yes  Discussion included risk/benefits of screening. yes  Discussion included potential follow up diagnostic testing for abnormal scans. yes  Discussion included meaning and risk of over diagnosis. yes  Discussion included meaning and risk of False Positives. yes  Discussion included meaning of total radiation exposure. yes  Counseling Included:  Importance of adherence to annual lung cancer LDCT screening. yes  Impact of comorbidities on ability to participate in the program. yes  Ability and willingness to under diagnostic treatment. yes  Smoking Cessation Counseling:  Current Smokers:   Discussed importance of smoking cessation. NA  Information about tobacco cessation classes and interventions provided to patient. yes  Patient provided with "ticket" for LDCT Scan. yes  Symptomatic Patient. no  Counseling  Diagnosis Code: Tobacco Use Z72.0  Asymptomatic Patient yes  Counseling (Intermediate counseling: > three minutes counseling) [id number]  Former Smokers:   Discussed the importance of maintaining cigarette abstinence. yes  Diagnosis Code: Personal History of Nicotine Dependence. K16.010  Information about tobacco cessation classes and interventions provided to patient. Yes  Patient provided with "ticket" for LDCT Scan. yes  Written Order for Lung Cancer  Screening with LDCT placed in Epic. Yes (CT Chest Lung Cancer Screening Low Dose W/O CM) [id number] Z12.2-Screening of respiratory organs Z87.891-Personal history of nicotine dependence  I spent 25 minutes of face to face time with Chase Lloyd discussing the risks and benefits of lung cancer screening. We viewed a power point together that explained in detail the above noted topics. We took the time to pause the power point at intervals to allow for questions to be asked and answered to ensure understanding. We discussed that he had taken the single most powerful action possible to decrease his risk of developing lung cancer when he quit smoking. I counseled him to remain smoke free, and to contact me if he ever had the desire to smoke again so that I can provide resources and tools to help support the effort to remain smoke free. We discussed the time and location of the scan, and that either  Doroteo Glassman RN or I will call with the results within  24-48 hours of receiving them. He has my card and contact information in the event he needs to speak with me, in addition to a copy of the power point we reviewed as a resource. He verbalized understanding of all of the above and had no further questions upon leaving the office.     I explained to the patient that there has been a high incidence of coronary artery disease noted on these exams. I explained that this is a non-gated exam therefore degree or severity cannot be determined. This patient is is not on statin therapy. I have asked the patient to follow-up with their PCP regarding any incidental finding of coronary artery disease and management with diet or medication as they feel  is clinically indicated. The patient verbalized understanding of the above and had no further questions.     Magdalen Spatz, NP 10/04/2018 4:47 PM

## 2018-10-06 ENCOUNTER — Other Ambulatory Visit: Payer: Self-pay

## 2018-10-06 ENCOUNTER — Emergency Department (HOSPITAL_COMMUNITY): Payer: Medicare Other

## 2018-10-06 ENCOUNTER — Ambulatory Visit: Payer: Medicare Other | Admitting: Internal Medicine

## 2018-10-06 ENCOUNTER — Inpatient Hospital Stay (HOSPITAL_COMMUNITY)
Admission: EM | Admit: 2018-10-06 | Discharge: 2018-10-12 | DRG: 189 | Disposition: A | Discharge status: Home / Self Care | Payer: Medicare Other | Attending: Internal Medicine | Admitting: Internal Medicine

## 2018-10-06 ENCOUNTER — Encounter (HOSPITAL_COMMUNITY): Payer: Self-pay

## 2018-10-06 ENCOUNTER — Telehealth: Payer: Self-pay | Admitting: Internal Medicine

## 2018-10-06 DIAGNOSIS — Z923 Personal history of irradiation: Secondary | ICD-10-CM

## 2018-10-06 DIAGNOSIS — Z79899 Other long term (current) drug therapy: Secondary | ICD-10-CM

## 2018-10-06 DIAGNOSIS — R0600 Dyspnea, unspecified: Secondary | ICD-10-CM

## 2018-10-06 DIAGNOSIS — Z23 Encounter for immunization: Secondary | ICD-10-CM | POA: Diagnosis not present

## 2018-10-06 DIAGNOSIS — H9191 Unspecified hearing loss, right ear: Secondary | ICD-10-CM | POA: Diagnosis not present

## 2018-10-06 DIAGNOSIS — R0603 Acute respiratory distress: Secondary | ICD-10-CM | POA: Diagnosis present

## 2018-10-06 DIAGNOSIS — N179 Acute kidney failure, unspecified: Secondary | ICD-10-CM | POA: Diagnosis not present

## 2018-10-06 DIAGNOSIS — I161 Hypertensive emergency: Secondary | ICD-10-CM | POA: Diagnosis not present

## 2018-10-06 DIAGNOSIS — Z9221 Personal history of antineoplastic chemotherapy: Secondary | ICD-10-CM

## 2018-10-06 DIAGNOSIS — K219 Gastro-esophageal reflux disease without esophagitis: Secondary | ICD-10-CM | POA: Diagnosis present

## 2018-10-06 DIAGNOSIS — Z981 Arthrodesis status: Secondary | ICD-10-CM

## 2018-10-06 DIAGNOSIS — J439 Emphysema, unspecified: Secondary | ICD-10-CM | POA: Diagnosis present

## 2018-10-06 DIAGNOSIS — Z8571 Personal history of Hodgkin lymphoma: Secondary | ICD-10-CM

## 2018-10-06 DIAGNOSIS — Z87891 Personal history of nicotine dependence: Secondary | ICD-10-CM

## 2018-10-06 DIAGNOSIS — J90 Pleural effusion, not elsewhere classified: Secondary | ICD-10-CM | POA: Diagnosis not present

## 2018-10-06 DIAGNOSIS — I160 Hypertensive urgency: Secondary | ICD-10-CM | POA: Diagnosis present

## 2018-10-06 DIAGNOSIS — I110 Hypertensive heart disease with heart failure: Secondary | ICD-10-CM | POA: Diagnosis present

## 2018-10-06 DIAGNOSIS — R7989 Other specified abnormal findings of blood chemistry: Secondary | ICD-10-CM | POA: Diagnosis present

## 2018-10-06 DIAGNOSIS — J9601 Acute respiratory failure with hypoxia: Secondary | ICD-10-CM | POA: Diagnosis not present

## 2018-10-06 DIAGNOSIS — R0609 Other forms of dyspnea: Secondary | ICD-10-CM | POA: Diagnosis present

## 2018-10-06 DIAGNOSIS — I5033 Acute on chronic diastolic (congestive) heart failure: Secondary | ICD-10-CM | POA: Diagnosis not present

## 2018-10-06 DIAGNOSIS — Z9889 Other specified postprocedural states: Secondary | ICD-10-CM

## 2018-10-06 DIAGNOSIS — R778 Other specified abnormalities of plasma proteins: Secondary | ICD-10-CM | POA: Diagnosis present

## 2018-10-06 DIAGNOSIS — Z8701 Personal history of pneumonia (recurrent): Secondary | ICD-10-CM

## 2018-10-06 DIAGNOSIS — Z7951 Long term (current) use of inhaled steroids: Secondary | ICD-10-CM

## 2018-10-06 DIAGNOSIS — R0602 Shortness of breath: Secondary | ICD-10-CM | POA: Diagnosis not present

## 2018-10-06 DIAGNOSIS — J189 Pneumonia, unspecified organism: Secondary | ICD-10-CM | POA: Diagnosis not present

## 2018-10-06 DIAGNOSIS — J441 Chronic obstructive pulmonary disease with (acute) exacerbation: Secondary | ICD-10-CM | POA: Diagnosis present

## 2018-10-06 LAB — COMPREHENSIVE METABOLIC PANEL
ALT: 26 U/L (ref 0–44)
AST: 31 U/L (ref 15–41)
Albumin: 3.4 g/dL — ABNORMAL LOW (ref 3.5–5.0)
Alkaline Phosphatase: 84 U/L (ref 38–126)
Anion gap: 8 (ref 5–15)
BUN: 12 mg/dL (ref 8–23)
CO2: 27 mmol/L (ref 22–32)
Calcium: 9.2 mg/dL (ref 8.9–10.3)
Chloride: 104 mmol/L (ref 98–111)
Creatinine, Ser: 1.23 mg/dL (ref 0.61–1.24)
GFR calc non Af Amer: >60 mL/min (ref >60)
Glucose, Bld: 99 mg/dL (ref 70–99)
Potassium: 3.8 mmol/L (ref 3.5–5.1)
Sodium: 139 mmol/L (ref 135–145)
Total Bilirubin: 0.7 mg/dL (ref 0.3–1.2)
Total Protein: 6.6 g/dL (ref 6.5–8.1)

## 2018-10-06 LAB — URINALYSIS, ROUTINE W REFLEX MICROSCOPIC
Bacteria, UA: NONE SEEN
Bilirubin Urine: NEGATIVE
Glucose, UA: NEGATIVE mg/dL
Hgb urine dipstick: NEGATIVE
Ketones, ur: NEGATIVE mg/dL
Leukocytes, UA: NEGATIVE
Nitrite: NEGATIVE
PROTEIN: 30 mg/dL — AB
Specific Gravity, Urine: 1.025 (ref 1.005–1.030)
pH: 5 (ref 5.0–8.0)

## 2018-10-06 LAB — CBC WITH DIFFERENTIAL/PLATELET
Abs Immature Granulocytes: 0.07 10*3/uL (ref 0.00–0.07)
Basophils Absolute: 0 10*3/uL (ref 0.0–0.1)
Basophils Relative: 1 %
EOS ABS: 0.1 10*3/uL (ref 0.0–0.5)
Eosinophils Relative: 1 %
HCT: 38.4 % — ABNORMAL LOW (ref 39.0–52.0)
Hemoglobin: 11.8 g/dL — ABNORMAL LOW (ref 13.0–17.0)
IMMATURE GRANULOCYTES: 1 %
Lymphocytes Relative: 15 %
Lymphs Abs: 0.9 10*3/uL (ref 0.7–4.0)
MCH: 30.4 pg (ref 26.0–34.0)
MCHC: 30.7 g/dL (ref 30.0–36.0)
MCV: 99 fL (ref 80.0–100.0)
Monocytes Absolute: 0.5 10*3/uL (ref 0.1–1.0)
Monocytes Relative: 8 %
Neutro Abs: 4.2 10*3/uL (ref 1.7–7.7)
Neutrophils Relative %: 74 %
Platelets: 154 10*3/uL (ref 150–400)
RBC: 3.88 MIL/uL — ABNORMAL LOW (ref 4.22–5.81)
RDW: 13.6 % (ref 11.5–15.5)
WBC: 5.8 10*3/uL (ref 4.0–10.5)
nRBC: 0 % (ref 0.0–0.2)

## 2018-10-06 LAB — I-STAT CG4 LACTIC ACID, ED
Lactic Acid, Venous: 0.94 mmol/L (ref 0.5–1.9)
Lactic Acid, Venous: 1.34 mmol/L (ref 0.5–1.9)

## 2018-10-06 LAB — TROPONIN I: Troponin I: 0.03 ng/mL (ref <0.03)

## 2018-10-06 LAB — PROTIME-INR
INR: 0.96
Prothrombin Time: 12.7 seconds (ref 11.4–15.2)

## 2018-10-06 LAB — BRAIN NATRIURETIC PEPTIDE: B Natriuretic Peptide: 65.7 pg/mL (ref 0.0–100.0)

## 2018-10-06 MED ORDER — IOPAMIDOL (ISOVUE-370) INJECTION 76%
INTRAVENOUS | Status: AC
  Filled 2018-10-06: qty 100

## 2018-10-06 MED ORDER — HYDRALAZINE HCL 20 MG/ML IJ SOLN
10.0000 mg | Freq: Once | INTRAMUSCULAR | Status: AC
  Administered 2018-10-06: 10 mg via INTRAVENOUS
  Filled 2018-10-06: qty 1

## 2018-10-06 MED ORDER — IOPAMIDOL (ISOVUE-370) INJECTION 76%
100.0000 mL | Freq: Once | INTRAVENOUS | Status: AC | PRN
  Administered 2018-10-06: 43 mL via INTRAVENOUS

## 2018-10-06 NOTE — ED Notes (Signed)
Dr. Nanavati at bedside 

## 2018-10-06 NOTE — ED Triage Notes (Addendum)
Pt. diagnosed with pneumonia 5 to 6 weeks ago.  Having a difficulty time getting better.  In the last few days sob is worse.  Had a CT scan done on Monday and was called with results , CHF and fluid around the lungs.  He is sob in triage and wheezing.  Skin is pale warm and dry.  Pt. Denies any chest pain.  Dr. Leonides Schanz told pt. To come to the ED if this round of antibiotics are not helpful.  He has been on Levaquin and steroids which not helping.

## 2018-10-06 NOTE — ED Notes (Signed)
Pt. To CT via stretcher. 

## 2018-10-06 NOTE — Telephone Encounter (Signed)
I have called Mr. Spiering with the results of his scan. I explained that his scan was read as a Lung RADS 4 B indicates suspicious findings for which additional diagnostic testing and or tissue sampling is recommended. Additionally I explained that the scan was concerning as there were bilateral pleural effusions, vs pneumonia, and suspected CHF. The patient has never been seen by a cardiologist . I spoke with the patient's wife in addition to the patient. She told me the patient is having worsening exertional dyspnea.The patient has had recent pneumonia ( 09/10/2018). Dr. Melvyn Novas had told him to go to the hospital if he continued to worsen when he saw the patient 09/21/2018. Based on the patient's worsening dyspnea, I have advised them to go to the ED for evaluation and treatment of pleural effusions, ? Pneumonia, ? Pulmonary edema  and suspected CHF. I told them we will see him for hospital follow up once he has been treated, but the most important issue at present was improving his dyspnea and making sure he was oxygenating adequately.Both the patient and his wife verbalized understanding.She is taking the patient to the hospital. Langley Gauss, please place order for follow up LDCT in 3 months. This may need to be adjusted based on the outcome of the hospitalization. Thanks so much.    Low Dose CT Chest Lung Cancer Screening Lung-RADS 4Bs, suspicious. Additional imaging evaluation or consultation with Pulmonology or Thoracic Surgery recommended. 2. The S modifier above refers to masslike area of architectural distortion within the right lung base with underlying moderate pleural effusion. This is indeterminate finding and may represent an area of pneumonia, rounded atelectasis or neoplasm. Additionally, the patient appears to be in heart failure. There is also a left pleural effusion and mild interstitial edema. Prior to proceeding with either PET-CT or biopsy of this area consider addressing any acute clinical  condition such as CHF and/or pneumonia followed by repeat imaging in 3 months after resolution of any pulmonary symptoms the patient may be experiencing. 3. Aortic Atherosclerosis (ICD10-I70.0) and Emphysema (ICD10-J43.9). 4. Multi vessel coronary artery atherosclerotic calcifications. 5. Prior granulomatous disease.

## 2018-10-06 NOTE — Telephone Encounter (Signed)
Called and spoke to patient, made aware NP was trying to contact him regarding his CT results. I informed patient that she was seeing patients but she would give him a call back this afternoon when she had time. Voiced understanding.   SG please advise.

## 2018-10-07 ENCOUNTER — Encounter (HOSPITAL_COMMUNITY): Payer: Self-pay | Admitting: Family Medicine

## 2018-10-07 ENCOUNTER — Other Ambulatory Visit: Payer: Self-pay

## 2018-10-07 ENCOUNTER — Observation Stay (HOSPITAL_COMMUNITY): Payer: Medicare Other

## 2018-10-07 ENCOUNTER — Observation Stay (HOSPITAL_BASED_OUTPATIENT_CLINIC_OR_DEPARTMENT_OTHER): Payer: Medicare Other

## 2018-10-07 ENCOUNTER — Other Ambulatory Visit: Payer: Self-pay | Admitting: Acute Care

## 2018-10-07 DIAGNOSIS — J439 Emphysema, unspecified: Secondary | ICD-10-CM | POA: Diagnosis not present

## 2018-10-07 DIAGNOSIS — Z981 Arthrodesis status: Secondary | ICD-10-CM | POA: Diagnosis not present

## 2018-10-07 DIAGNOSIS — I5033 Acute on chronic diastolic (congestive) heart failure: Secondary | ICD-10-CM | POA: Diagnosis not present

## 2018-10-07 DIAGNOSIS — I361 Nonrheumatic tricuspid (valve) insufficiency: Secondary | ICD-10-CM | POA: Diagnosis not present

## 2018-10-07 DIAGNOSIS — R7989 Other specified abnormal findings of blood chemistry: Secondary | ICD-10-CM | POA: Diagnosis not present

## 2018-10-07 DIAGNOSIS — I160 Hypertensive urgency: Secondary | ICD-10-CM

## 2018-10-07 DIAGNOSIS — N179 Acute kidney failure, unspecified: Secondary | ICD-10-CM | POA: Diagnosis not present

## 2018-10-07 DIAGNOSIS — H9191 Unspecified hearing loss, right ear: Secondary | ICD-10-CM | POA: Diagnosis not present

## 2018-10-07 DIAGNOSIS — J441 Chronic obstructive pulmonary disease with (acute) exacerbation: Secondary | ICD-10-CM | POA: Diagnosis not present

## 2018-10-07 DIAGNOSIS — J90 Pleural effusion, not elsewhere classified: Secondary | ICD-10-CM | POA: Diagnosis not present

## 2018-10-07 DIAGNOSIS — Z8571 Personal history of Hodgkin lymphoma: Secondary | ICD-10-CM | POA: Diagnosis not present

## 2018-10-07 DIAGNOSIS — Z87891 Personal history of nicotine dependence: Secondary | ICD-10-CM | POA: Diagnosis not present

## 2018-10-07 DIAGNOSIS — R778 Other specified abnormalities of plasma proteins: Secondary | ICD-10-CM | POA: Diagnosis present

## 2018-10-07 DIAGNOSIS — Z122 Encounter for screening for malignant neoplasm of respiratory organs: Secondary | ICD-10-CM

## 2018-10-07 DIAGNOSIS — I340 Nonrheumatic mitral (valve) insufficiency: Secondary | ICD-10-CM

## 2018-10-07 DIAGNOSIS — K219 Gastro-esophageal reflux disease without esophagitis: Secondary | ICD-10-CM | POA: Diagnosis not present

## 2018-10-07 DIAGNOSIS — J9601 Acute respiratory failure with hypoxia: Secondary | ICD-10-CM | POA: Diagnosis not present

## 2018-10-07 DIAGNOSIS — Z23 Encounter for immunization: Secondary | ICD-10-CM | POA: Diagnosis not present

## 2018-10-07 DIAGNOSIS — I161 Hypertensive emergency: Secondary | ICD-10-CM | POA: Diagnosis not present

## 2018-10-07 LAB — TROPONIN I
Troponin I: <0.03 ng/mL (ref <0.03)
Troponin I: <0.03 ng/mL (ref <0.03)

## 2018-10-07 LAB — BASIC METABOLIC PANEL
ANION GAP: 12 (ref 5–15)
BUN: 12 mg/dL (ref 8–23)
CO2: 25 mmol/L (ref 22–32)
Calcium: 9 mg/dL (ref 8.9–10.3)
Chloride: 102 mmol/L (ref 98–111)
Creatinine, Ser: 0.93 mg/dL (ref 0.61–1.24)
GFR calc Af Amer: >60 mL/min (ref >60)
GFR calc non Af Amer: >60 mL/min (ref >60)
Glucose, Bld: 90 mg/dL (ref 70–99)
Potassium: 3.5 mmol/L (ref 3.5–5.1)
Sodium: 139 mmol/L (ref 135–145)

## 2018-10-07 LAB — ECHOCARDIOGRAM COMPLETE
Height: 70 in
Weight: 2888.91 oz

## 2018-10-07 MED ORDER — LABETALOL HCL 5 MG/ML IV SOLN: 10.0000 mg | INTRAVENOUS | Status: DC | PRN

## 2018-10-07 MED ORDER — LABETALOL HCL 5 MG/ML IV SOLN: 40.0000 mg | INTRAVENOUS | Status: DC | PRN

## 2018-10-07 MED ORDER — ALBUTEROL SULFATE (2.5 MG/3ML) 0.083% IN NEBU
2.5000 mg | INHALATION_SOLUTION | RESPIRATORY_TRACT | Status: DC | PRN
  Administered 2018-10-07: 2.5 mg via RESPIRATORY_TRACT
  Filled 2018-10-07: qty 3

## 2018-10-07 MED ORDER — METHYLPREDNISOLONE SODIUM SUCC 125 MG IJ SOLR
125.0000 mg | Freq: Once | INTRAMUSCULAR | Status: AC
  Administered 2018-10-07: 125 mg via INTRAVENOUS
  Filled 2018-10-07: qty 2

## 2018-10-07 MED ORDER — LABETALOL HCL 5 MG/ML IV SOLN
20.0000 mg | Freq: Once | INTRAVENOUS | Status: AC
  Administered 2018-10-07: 20 mg via INTRAVENOUS
  Filled 2018-10-07: qty 4

## 2018-10-07 MED ORDER — LABETALOL HCL 5 MG/ML IV SOLN: 80.0000 mg | INTRAVENOUS | Status: DC | PRN

## 2018-10-07 MED ORDER — SODIUM CHLORIDE 0.9% FLUSH
3.0000 mL | INTRAVENOUS | Status: DC | PRN
  Administered 2018-10-07: 3 mL via INTRAVENOUS
  Filled 2018-10-07: qty 3

## 2018-10-07 MED ORDER — IPRATROPIUM-ALBUTEROL 0.5-2.5 (3) MG/3ML IN SOLN
3.0000 mL | Freq: Four times a day (QID) | RESPIRATORY_TRACT | Status: DC
  Administered 2018-10-07 – 2018-10-10 (×13): 3 mL via RESPIRATORY_TRACT
  Filled 2018-10-07 (×14): qty 3

## 2018-10-07 MED ORDER — ACETAMINOPHEN 325 MG PO TABS
650.0000 mg | ORAL_TABLET | Freq: Four times a day (QID) | ORAL | Status: DC | PRN
  Administered 2018-10-07: 650 mg via ORAL
  Filled 2018-10-07: qty 2

## 2018-10-07 MED ORDER — UMECLIDINIUM BROMIDE 62.5 MCG/INH IN AEPB
1.0000 | INHALATION_SPRAY | Freq: Every day | RESPIRATORY_TRACT | Status: DC
  Administered 2018-10-07: 1 via RESPIRATORY_TRACT
  Filled 2018-10-07: qty 7

## 2018-10-07 MED ORDER — ONDANSETRON HCL 4 MG/2ML IJ SOLN: 4.0000 mg | Freq: Four times a day (QID) | INTRAMUSCULAR | Status: DC | PRN

## 2018-10-07 MED ORDER — SENNOSIDES-DOCUSATE SODIUM 8.6-50 MG PO TABS: 1.0000 | ORAL_TABLET | Freq: Every evening | ORAL | Status: DC | PRN

## 2018-10-07 MED ORDER — METHYLPREDNISOLONE SODIUM SUCC 125 MG IJ SOLR
60.0000 mg | Freq: Four times a day (QID) | INTRAMUSCULAR | Status: DC
  Administered 2018-10-07 – 2018-10-10 (×13): 60 mg via INTRAVENOUS
  Filled 2018-10-07 (×13): qty 2

## 2018-10-07 MED ORDER — SODIUM CHLORIDE 0.9% FLUSH
3.0000 mL | Freq: Two times a day (BID) | INTRAVENOUS | Status: DC
  Administered 2018-10-07: 3 mL via INTRAVENOUS

## 2018-10-07 MED ORDER — SODIUM CHLORIDE 0.9% FLUSH
3.0000 mL | Freq: Two times a day (BID) | INTRAVENOUS | Status: DC
  Administered 2018-10-07 – 2018-10-12 (×11): 3 mL via INTRAVENOUS

## 2018-10-07 MED ORDER — ONDANSETRON HCL 4 MG PO TABS: 4.0000 mg | ORAL_TABLET | Freq: Four times a day (QID) | ORAL | Status: DC | PRN

## 2018-10-07 MED ORDER — SODIUM CHLORIDE 0.9 % IV SOLN
500.0000 mg | INTRAVENOUS | Status: DC
  Administered 2018-10-07: 500 mg via INTRAVENOUS
  Administered 2018-10-08: 250 mg via INTRAVENOUS
  Administered 2018-10-09 – 2018-10-10 (×2): 500 mg via INTRAVENOUS
  Filled 2018-10-07 (×4): qty 500

## 2018-10-07 MED ORDER — HYDROCODONE-ACETAMINOPHEN 5-325 MG PO TABS: 1.0000 | ORAL_TABLET | ORAL | Status: DC | PRN

## 2018-10-07 MED ORDER — ALBUTEROL SULFATE (2.5 MG/3ML) 0.083% IN NEBU
2.5000 mg | INHALATION_SOLUTION | RESPIRATORY_TRACT | Status: DC
  Administered 2018-10-07: 2.5 mg via RESPIRATORY_TRACT
  Filled 2018-10-07: qty 3

## 2018-10-07 MED ORDER — ACETAMINOPHEN 325 MG PO TABS
650.0000 mg | ORAL_TABLET | Freq: Four times a day (QID) | ORAL | Status: DC | PRN
  Administered 2018-10-07 – 2018-10-12 (×14): 650 mg via ORAL
  Filled 2018-10-07 (×14): qty 2

## 2018-10-07 MED ORDER — PANTOPRAZOLE SODIUM 40 MG PO TBEC
40.0000 mg | DELAYED_RELEASE_TABLET | Freq: Every day | ORAL | Status: DC
  Administered 2018-10-07 – 2018-10-12 (×6): 40 mg via ORAL
  Filled 2018-10-07 (×6): qty 1

## 2018-10-07 MED ORDER — ENOXAPARIN SODIUM 40 MG/0.4ML ~~LOC~~ SOLN
40.0000 mg | SUBCUTANEOUS | Status: DC
  Administered 2018-10-07 – 2018-10-11 (×5): 40 mg via SUBCUTANEOUS
  Filled 2018-10-07 (×5): qty 0.4

## 2018-10-07 MED ORDER — ALBUTEROL SULFATE (2.5 MG/3ML) 0.083% IN NEBU
2.5000 mg | INHALATION_SOLUTION | RESPIRATORY_TRACT | Status: DC | PRN
  Filled 2018-10-07: qty 3

## 2018-10-07 MED ORDER — MOMETASONE FURO-FORMOTEROL FUM 200-5 MCG/ACT IN AERO
2.0000 | INHALATION_SPRAY | Freq: Two times a day (BID) | RESPIRATORY_TRACT | Status: DC
  Administered 2018-10-07 – 2018-10-12 (×11): 2 via RESPIRATORY_TRACT
  Filled 2018-10-07: qty 8.8

## 2018-10-07 MED ORDER — INFLUENZA VAC SPLIT QUAD 0.5 ML IM SUSY
0.5000 mL | PREFILLED_SYRINGE | INTRAMUSCULAR | Status: AC
  Administered 2018-10-11: 0.5 mL via INTRAMUSCULAR
  Filled 2018-10-07 (×2): qty 0.5

## 2018-10-07 MED ORDER — SODIUM CHLORIDE 0.9 % IV SOLN: 250.0000 mL | INTRAVENOUS | Status: DC | PRN

## 2018-10-07 MED ORDER — ACETAMINOPHEN 650 MG RE SUPP: 650.0000 mg | Freq: Four times a day (QID) | RECTAL | Status: DC | PRN

## 2018-10-07 NOTE — ED Provider Notes (Addendum)
[facility] EMERGENCY DEPARTMENT Provider Note   CSN: [id number] Arrival date & time: 10/06/18  1755     History   Chief Complaint Chief Complaint  Patient presents with  . Shortness of Breath  . Pneumonia    HPI Chase Lloyd is a 62 y.o. male.  HPI  62 year old male with history of lung emphysema and recent spine surgery comes in with chief complaint of shortness of breath.  Patient reports that after his surgery in September he started having some shortness of breath.  He was started on an antibiotic by his PCP.  Patient's symptoms did not improve therefore he saw his pulmonologist to put him on Levaquin and doxycycline.  Patient also had a CT scan done 2 days ago that was ordered by the pulmonologist for screening of cancer, and the CT results showed that he had some pulmonary edema and questionable pneumonia.  His pulmonologist told him that if he is not getting better than he needed to go to the hospital and get admitted, and be seen by a pulmonologist and cardiologist.  Patient denies any chest pain.  He has no history of PE, DVT.  Patient now states that minimal exertion gets him short of breath which is unusual.  He is taking prednisone right now and has been taking his nebulizers as ordered.  Past Medical History:  Diagnosis Date  . Back pain   . COPD (chronic obstructive pulmonary disease) ([place])   . DDD (degenerative disc disease), lumbar   . Depression   . Emphysema of lung ([place])   . Empyema ([place])    2016  (was in [place])  . GERD (gastroesophageal reflux disease)   . Hearing loss of right ear    started 2001.     microphone in right ear ,  and hearing aid in left  . Hodgkin's disease ([place]) 1999  . Hypotension   . Meniere's disease   . Pneumonia   . Tinnitus     Patient Active Problem List   Diagnosis Date Noted  . Acute respiratory distress 10/06/2018  . Essential hypertension 09/22/2018  . Low back pain 07/18/2018  . Traumatic  subdural hematoma of neuraxis ([place]) 07/18/2018  . Lumbar spinal stenosis 07/16/2018  . GERD (gastroesophageal reflux disease) 04/26/2018  . Intermittent claudication ([place]) 06/12/2017  . Lumbar spondylosis 06/12/2017  . COPD GOLD III D 05/29/2017  . Tendinopathy of left gluteus medius 05/01/2017  . Former cigarette smoker - 70 pack year hx 05/01/2017    Past Surgical History:  Procedure Laterality Date  . BACK SURGERY    . CERVICAL FUSION  2016  . ELBOW SURGERY    . EYE SURGERY    . LAMINECTOMY  1999  . lower back surgery    . TONSILLECTOMY          Home Medications    Prior to Admission medications   Medication Sig Start Date End Date Taking? Authorizing Provider  albuterol (PROVENTIL) (2.5 MG/3ML) 0.083% nebulizer solution Take 3 mLs (2.5 mg total) by nebulization every 6 (six) hours as needed for wheezing or shortness of breath. 09/21/18  Yes Tanda Rockers, MD  ibuprofen (ADVIL,MOTRIN) 800 MG tablet Take 800 mg by mouth every 8 (eight) hours as needed (for pain.).    Yes [provider]  omeprazole (PRILOSEC) 40 MG capsule Take 1 capsule (40 mg total) by mouth daily. 04/26/18  Yes Nafziger, Tommi Rumps, NP  SYMBICORT 160-4.5 MCG/ACT inhaler Inhale 2 puffs into the  lungs 2 (two) times daily. 09/09/18  Yes Tanda Rockers, MD  Tiotropium Bromide Monohydrate (SPIRIVA RESPIMAT) 2.5 MCG/ACT AERS Inhale 2 puffs into the lungs daily. 09/10/18  Yes Lauraine Rinne, NP    Family History Family History  Problem Relation Age of Onset  . Hypertension Father   . Diabetes Father   . Testicular cancer Brother   . Melanoma Brother     Social History Social History   Tobacco Use  . Smoking status: Former Smoker    Packs/day: 1.50    Years: 47.00    Pack years: 70.50    Types: Cigarettes    Last attempt to quit: 06/13/2018    Years since quitting: 0.3  . Smokeless tobacco: Never Used  Substance Use Topics  . Alcohol use: Yes    Comment: 24 cans in a week  . Drug use: No      Allergies   Patient has no known allergies.   Review of Systems Review of Systems  Constitutional: Positive for activity change.  Respiratory: Positive for shortness of breath.   All other systems reviewed and are negative.    Physical Exam Updated Vital Signs BP (!) 180/81   Pulse (!) 102   Temp 98.2 F (36.8 C) (Oral)   Resp (!) 25   Ht 5\' 10"  (1.778 m)   Wt 81.9 kg   SpO2 97%   BMI 25.91 kg/m   Physical Exam  Constitutional: He is oriented to person, place, and time. He appears well-developed.  HENT:  Head: Atraumatic.  Neck: Neck supple.  Cardiovascular: Normal rate.  Pulmonary/Chest: Effort normal. Tachypnea noted. He has wheezes in the right lower field and the left lower field. He has rales in the right lower field and the left lower field.  Neurological: He is alert and oriented to person, place, and time.  Skin: Skin is warm.  Nursing note and vitals reviewed.    ED Treatments / Results  Labs (all labs ordered are listed, but only abnormal results are displayed) Labs Reviewed  COMPREHENSIVE METABOLIC PANEL - Abnormal; Notable for the following components:      Result Value   Albumin 3.4 (*)    All other components within normal limits  CBC WITH DIFFERENTIAL/PLATELET - Abnormal; Notable for the following components:   RBC 3.88 (*)    Hemoglobin 11.8 (*)    HCT 38.4 (*)    All other components within normal limits  URINALYSIS, ROUTINE W REFLEX MICROSCOPIC - Abnormal; Notable for the following components:   Protein, ur 30 (*)    All other components within normal limits  TROPONIN I - Abnormal; Notable for the following components:   Troponin I 0.03 (*)    All other components within normal limits  CULTURE, BLOOD (ROUTINE X 2)  CULTURE, BLOOD (ROUTINE X 2)  PROTIME-INR  BRAIN NATRIURETIC PEPTIDE  I-STAT CG4 LACTIC ACID, ED  I-STAT CG4 LACTIC ACID, ED    EKG EKG Interpretation  Date/Time:  Wednesday October 06 2018 19:02:15  EST Ventricular Rate:  107 PR Interval:  150 QRS Duration: 94 QT Interval:  336 QTC Calculation: 448 R Axis:   97 Text Interpretation:  Sinus tachycardia Rightward axis Borderline ECG [id number] new Confirmed by Varney Biles ([id number]) on 10/06/2018 6:48:42 PM   Radiology Dg Chest 2 View  Result Date: 10/06/2018 CLINICAL DATA:  62 year old male with pneumonia. EXAM: CHEST - 2 VIEW COMPARISON:  Chest CT dated 10/04/2018 FINDINGS: There is emphysematous changes of the lungs and  diffuse interstitial coarsening and mild chronic bronchitic changes. There is a small right pleural effusion. There is no focal consolidation. Slight increased density over the right mid to lower lung field similar to prior radiograph likely atelectasis. Developing infiltrate is not excluded. Clinical correlation is recommended. No pneumothorax. The cardiac silhouette is within normal limits. No acute osseous pathology. Cervical fixation hardware. IMPRESSION: 1. Small right pleural effusion and probable right lung base atelectasis. Developing infiltrate is not excluded. 2. Emphysema. Electronically Signed   By: Anner Crete M.D.   On: 10/06/2018 21:17   Ct Angio Chest Pe W And/or Wo Contrast  Result Date: 10/06/2018 CLINICAL DATA:  Shortness of breath EXAM: CT ANGIOGRAPHY CHEST WITH CONTRAST TECHNIQUE: Multidetector CT imaging of the chest was performed using the standard protocol during bolus administration of intravenous contrast. Multiplanar CT image reconstructions and MIPs were obtained to evaluate the vascular anatomy. CONTRAST:  29mL ISOVUE-370 IOPAMIDOL (ISOVUE-370) INJECTION 76% COMPARISON:  None. FINDINGS: Cardiovascular: No filling defects in the pulmonary arteries to suggest pulmonary emboli. Diffuse coronary artery calcifications. Moderate aortic calcifications. No aneurysm. Heart is normal size. Mediastinum/Nodes: No mediastinal, hilar, or axillary adenopathy. Lungs/Pleura: Small bilateral pleural effusions.  Advanced emphysema. Postoperative changes in the right upper lobe. Bibasilar and biapical scarring. Upper Abdomen: Imaging into the upper abdomen shows no acute findings. Musculoskeletal: Chest wall soft tissues are unremarkable. No acute bony abnormality Review of the MIP images confirms the above findings. IMPRESSION: Advanced emphysema. Postoperative changes in the right upper lobe. Small bilateral pleural effusions. Coronary artery disease, aortic atherosclerosis. No evidence of pulmonary embolus. Electronically Signed   By: Rolm Baptise M.D.   On: 10/06/2018 22:12    Procedures .Critical Care Performed by: Varney Biles, MD Authorized by: Varney Biles, MD   Critical care provider statement:    Critical care time (minutes):  45   Critical care start time:  10/06/2018 10:00 PM   Critical care end time:  10/07/2018 12:12 AM   Critical care time was exclusive of:  Separately billable procedures and treating other patients   Critical care was necessary to treat or prevent imminent or life-threatening deterioration of the following conditions:  Respiratory failure and circulatory failure   Critical care was time spent personally by me on the following activities:  Discussions with consultants, evaluation of patient's response to treatment, examination of patient, ordering and performing treatments and interventions, ordering and review of laboratory studies, ordering and review of radiographic studies, pulse oximetry, re-evaluation of patient's condition, obtaining history from patient or surrogate and review of old charts   (including critical care time)  Medications Ordered in ED Medications  iopamidol (ISOVUE-370) 76 % injection (has no administration in time range)  labetalol (NORMODYNE,TRANDATE) injection 20 mg (has no administration in time range)    And  labetalol (NORMODYNE,TRANDATE) injection 40 mg (has no administration in time range)    And  labetalol (NORMODYNE,TRANDATE)  injection 80 mg (has no administration in time range)  iopamidol (ISOVUE-370) 76 % injection 100 mL (43 mLs Intravenous Contrast Given 10/06/18 2159)  hydrALAZINE (APRESOLINE) injection 10 mg (10 mg Intravenous Given 10/06/18 2348)     Initial Impression / Assessment and Plan / ED Course  I have reviewed the triage vital signs and the nursing notes.  Pertinent labs & imaging results that were available during my care of the patient were reviewed by me and considered in my medical decision making (see chart for details).     62 year old male comes in with chief complaint  of shortness of breath.  He is noted to be hypertensive.  Patient is tachypneic but not in hypoxic respiratory failure.  He is noted to have minimal rales at the base and no pitting edema or JVD.  Patient also has mild wheezing.  He has history of emphysema and a recent spine surgery.  Patient has been on 2 rounds of antibiotics for a presumed pneumonia without any improvement.  Differential diagnosis includes new onset congestive heart failure, ACS, PE, pleural effusion or worsening pneumonia.  Patient truly is not having orthopnea or paroxysmal nocturnal dyspnea -therefore CHF is deemed less likely in the setting of no pitting edema or significant rales.  X-ray also ordered to evaluate for any large pleural effusion or pulmonary edema.  12:09 AM Patient's x-ray did not appear concerning.  His BNP is mildly elevated.  White count and lactate are fine.  CT PE ordered and is negative for acute process.  Patient is still noted to be dyspneic.  We will admit him to medicine service. I spoke with Dr. Myna Hidalgo about management of the blood pressure.  I have ordered 10 mg IV hydralazine already, and Dr. Myna Hidalgo is okay for give him labetalol for better blood pressure control.  Patient does not have any history of elevated blood pressure.  Working diagnosis is that patient's symptoms could be because of worsening emphysema or new onset  diastolic dysfunction/valvular disorder or hypertension related.   Results from the ER workup discussed with the patient face to face and all questions answered to the best of my ability.    Final Clinical Impressions(s) / ED Diagnoses   Final diagnoses:  Hypertensive emergency  Dyspnea, unspecified type    ED Discharge Orders    None       Varney Biles, MD 10/07/18 [id number]    Varney Biles, MD 10/07/18 2979

## 2018-10-07 NOTE — Plan of Care (Signed)
  Problem: Clinical Measurements: Goal: Respiratory complications will improve Outcome: Progressing   Problem: Health Behavior/Discharge Planning: Goal: Ability to manage health-related needs will improve Outcome: Progressing   Problem: Pain Managment: Goal: General experience of comfort will improve Outcome: Progressing   

## 2018-10-07 NOTE — Progress Notes (Signed)
Patient asleep resting comfortably in bed.Will continue to monitor.

## 2018-10-07 NOTE — Progress Notes (Signed)
New orders received and carried out.Will continue to monitor patient.

## 2018-10-07 NOTE — Progress Notes (Signed)
  Echocardiogram 2D Echocardiogram has been performed.  Chase Lloyd 10/07/2018, 11:22 AM

## 2018-10-07 NOTE — Progress Notes (Signed)
0055 Patient arrived to unit 2 west bed 32 from emergency department and walked from stretcher to bed with ED staff.Patient short of breath with and wheezing from short walk.Oxygen at 2 liters nasal canula placed and oxygen saturations 96%.Patient c/o headache 5/10 and blood pressure elevated 203/90. Oriented patient to call bell and nursing unit.Educated patient not to get out of bed without assistance from staff and patient verbalized understanding.Text paged Dr.Opyd about elevated blood pressure and need for breathing treatment and medication for headache.

## 2018-10-07 NOTE — Progress Notes (Addendum)
Patient admitted after midnight, please see H&P.  Here with SOB/cough.  ON exam is still having wheezing in all lung fields.  Increased work of breathing with just speaking and eating.  Will change nebulizer after discussion with respiratory therapy to schedule duonebs and PRN albuterol (change from scheduled albuterol).  Continue IV solumedrol.  BNP normal but patient has never had echo so will check echo as well.  CTA w/o PE but does have advanced emphysema.  ON O2 but no documentation of low O2 sat.  Will ask nurse to check on RA.   Per chart review, failed 2 rounds of outpatient abx/steroids. Eulogio Bear DO

## 2018-10-07 NOTE — H&P (Signed)
History and Physical    Chase Lloyd FKC:[id number] DOB: Sep 16, 1956 DOA: 10/06/2018  PCP: [name], NP   Patient coming from: Home   Chief Complaint: SOB, cough   HPI: Chase Lloyd is a 62 y.o. male with medical history significant for emphysema, tobacco abuse in remission, and degenerative disc disease, now presenting to the emergency department for evaluation of progressive shortness of breath and cough despite completing 2 courses of antibiotics and prednisone over the past 1 month.  Patient reports that his chronic dyspnea and cough had worsened a little over a month ago, he was seen by his PCP at that time, started on doxycycline and prednisone, failed to have any significant improvement, and then followed up with his pulmonologist about 2 weeks ago.  Pulmonologist started Levaquin and prednisone, but he continued to have increased shortness of breath and cough.  There has not been any chest pain, fevers, chills, or leg swelling or tenderness associated with this.  He underwent a lung cancer screening CT on 10/04/2018 with possible pneumonia versus atelectasis versus neoplasm, as well as left pleural effusion and suspected interstitial edema.  He was contacted by his pulmonology clinic and directed to the ED for evaluation of this.  ED Course: Upon arrival to the ED, patient is found to be afebrile, saturating mid 90s on room air, slightly tachypneic, mildly tachycardic, and with blood pressure 205/87.  EKG features a sinus tachycardia with rate 107, right axis deviation, and nonspecific ST-T abnormality that appears similar to prior.  CTA chest was performed and is negative for PE, but notable for small bilateral pleural effusions and advanced emphysema.  Blood cultures were collected in the ED and the patient was treated with labetalol with improvement in his blood pressure.  Hospitalist were asked to admit for further evaluation and management.  Review of Systems:  All other systems  reviewed and apart from HPI, are negative.  Past Medical History:  Diagnosis Date  . Back pain   . COPD (chronic obstructive pulmonary disease) ([place])   . DDD (degenerative disc disease), lumbar   . Depression   . Emphysema of lung ([place])   . Empyema ([place])    2016  (was in [place])  . GERD (gastroesophageal reflux disease)   . Hearing loss of right ear    started 2001.     microphone in right ear ,  and hearing aid in left  . Hodgkin's disease ([place]) 1999  . Hypotension   . Meniere's disease   . Pneumonia   . Tinnitus     Past Surgical History:  Procedure Laterality Date  . BACK SURGERY    . CERVICAL FUSION  2016  . ELBOW SURGERY    . EYE SURGERY    . LAMINECTOMY  1999  . lower back surgery    . TONSILLECTOMY       reports that he quit smoking about 3 months ago. His smoking use included cigarettes. He has a 70.50 pack-year smoking history. He has never used smokeless tobacco. He reports that he drinks alcohol. He reports that he does not use drugs.  No Known Allergies  Family History  Problem Relation Age of Onset  . Hypertension Father   . Diabetes Father   . Testicular cancer Brother   . Melanoma Brother      Prior to Admission medications   Medication Sig Start Date End Date Taking? Authorizing Provider  albuterol (PROVENTIL) (2.5 MG/3ML) 0.083% nebulizer solution Take 3 mLs (2.5  mg total) by nebulization every 6 (six) hours as needed for wheezing or shortness of breath. 09/21/18  Yes Tanda Rockers, MD  ibuprofen (ADVIL,MOTRIN) 800 MG tablet Take 800 mg by mouth every 8 (eight) hours as needed (for pain.).    Yes [provider]  omeprazole (PRILOSEC) 40 MG capsule Take 1 capsule (40 mg total) by mouth daily. 04/26/18  Yes Nafziger, Tommi Rumps, NP  SYMBICORT 160-4.5 MCG/ACT inhaler Inhale 2 puffs into the lungs 2 (two) times daily. 09/09/18  Yes Tanda Rockers, MD  Tiotropium Bromide Monohydrate (SPIRIVA RESPIMAT) 2.5 MCG/ACT AERS Inhale 2 puffs into the lungs  daily. 09/10/18  Yes Lauraine Rinne, NP    Physical Exam: Vitals:   10/07/18 0057 10/07/18 0058 10/07/18 0148 10/07/18 0152  BP: (!) 201/79 (!) 203/90 (!) 156/76   Pulse: (!) 110     Resp: (!) 25     Temp: 97.8 F (36.6 C)     TempSrc: Oral     SpO2: 99%   98%  Weight:      Height:        Constitutional: NAD, calm  Eyes: PERTLA, lids and conjunctivae normal ENMT: Mucous membranes are moist. Posterior pharynx clear of any exudate or lesions.   Neck: normal, supple, no masses, no thyromegaly Respiratory: breath sounds diminished with prolonged expiratory phase and expiratory wheeze on the left. No accessory muscle use.  Cardiovascular: S1 & S2 heard, regular rate and rhythm. No extremity edema. No significant JVD. Abdomen: No distension, no tenderness, soft. Bowel sounds active.  Musculoskeletal: no clubbing / cyanosis. No joint deformity upper and lower extremities.   Skin: no significant rashes, lesions, ulcers. Warm, dry, well-perfused. Neurologic: No facial asymmetry. Sensation intact. Moving all extremities.  Psychiatric: Alert and oriented x 3. Pleasant and cooperative.    Labs on Admission: I have personally reviewed following labs and imaging studies  CBC: Recent Labs  Lab 10/06/18 1833  WBC 5.8  NEUTROABS 4.2  HGB 11.8*  HCT 38.4*  MCV 99.0  PLT 644   Basic Metabolic Panel: Recent Labs  Lab 10/06/18 1833  NA 139  K 3.8  CL 104  CO2 27  GLUCOSE 99  BUN 12  CREATININE 1.23  CALCIUM 9.2   GFR: Estimated Creatinine Clearance: 62.6 mL/min (by C-G formula based on SCr of 1.23 mg/dL). Liver Function Tests: Recent Labs  Lab 10/06/18 1833  AST 31  ALT 26  ALKPHOS 84  BILITOT 0.7  PROT 6.6  ALBUMIN 3.4*   No results for input(s): LIPASE, AMYLASE in the last 168 hours. No results for input(s): AMMONIA in the last 168 hours. Coagulation Profile: Recent Labs  Lab 10/06/18 1833  INR 0.96   Cardiac Enzymes: Recent Labs  Lab 10/06/18 2114    TROPONINI 0.03*   BNP (last 3 results) No results for input(s): PROBNP in the last 8760 hours. HbA1C: No results for input(s): HGBA1C in the last 72 hours. CBG: No results for input(s): GLUCAP in the last 168 hours. Lipid Profile: No results for input(s): CHOL, HDL, LDLCALC, TRIG, CHOLHDL, LDLDIRECT in the last 72 hours. Thyroid Function Tests: No results for input(s): TSH, T4TOTAL, FREET4, T3FREE, THYROIDAB in the last 72 hours. Anemia Panel: No results for input(s): VITAMINB12, FOLATE, FERRITIN, TIBC, IRON, RETICCTPCT in the last 72 hours. Urine analysis:    Component Value Date/Time   COLORURINE YELLOW 10/06/2018 1809   APPEARANCEUR CLEAR 10/06/2018 1809   LABSPEC 1.025 10/06/2018 1809   PHURINE 5.0 10/06/2018 1809  GLUCOSEU NEGATIVE 10/06/2018 1809   HGBUR NEGATIVE 10/06/2018 1809   BILIRUBINUR NEGATIVE 10/06/2018 1809   KETONESUR NEGATIVE 10/06/2018 1809   PROTEINUR 30 (A) 10/06/2018 1809   NITRITE NEGATIVE 10/06/2018 1809   LEUKOCYTESUR NEGATIVE 10/06/2018 1809   Sepsis Labs: @LABRCNTIP (procalcitonin:4,lacticidven:4) )No results found for this or any previous visit (from the past 240 hour(s)).   Radiological Exams on Admission: Dg Chest 2 View  Result Date: 10/06/2018 CLINICAL DATA:  62 year old male with pneumonia. EXAM: CHEST - 2 VIEW COMPARISON:  Chest CT dated 10/04/2018 FINDINGS: There is emphysematous changes of the lungs and diffuse interstitial coarsening and mild chronic bronchitic changes. There is a small right pleural effusion. There is no focal consolidation. Slight increased density over the right mid to lower lung field similar to prior radiograph likely atelectasis. Developing infiltrate is not excluded. Clinical correlation is recommended. No pneumothorax. The cardiac silhouette is within normal limits. No acute osseous pathology. Cervical fixation hardware. IMPRESSION: 1. Small right pleural effusion and probable right lung base atelectasis. Developing  infiltrate is not excluded. 2. Emphysema. Electronically Signed   By: Anner Crete M.D.   On: 10/06/2018 21:17   Ct Angio Chest Pe W And/or Wo Contrast  Result Date: 10/06/2018 CLINICAL DATA:  Shortness of breath EXAM: CT ANGIOGRAPHY CHEST WITH CONTRAST TECHNIQUE: Multidetector CT imaging of the chest was performed using the standard protocol during bolus administration of intravenous contrast. Multiplanar CT image reconstructions and MIPs were obtained to evaluate the vascular anatomy. CONTRAST:  6mL ISOVUE-370 IOPAMIDOL (ISOVUE-370) INJECTION 76% COMPARISON:  None. FINDINGS: Cardiovascular: No filling defects in the pulmonary arteries to suggest pulmonary emboli. Diffuse coronary artery calcifications. Moderate aortic calcifications. No aneurysm. Heart is normal size. Mediastinum/Nodes: No mediastinal, hilar, or axillary adenopathy. Lungs/Pleura: Small bilateral pleural effusions. Advanced emphysema. Postoperative changes in the right upper lobe. Bibasilar and biapical scarring. Upper Abdomen: Imaging into the upper abdomen shows no acute findings. Musculoskeletal: Chest wall soft tissues are unremarkable. No acute bony abnormality Review of the MIP images confirms the above findings. IMPRESSION: Advanced emphysema. Postoperative changes in the right upper lobe. Small bilateral pleural effusions. Coronary artery disease, aortic atherosclerosis. No evidence of pulmonary embolus. Electronically Signed   By: Rolm Baptise M.D.   On: 10/06/2018 22:12    EKG: Independently reviewed. Sinus tachycardia, RAD, non-specific ST-T abnormality appears similar to prior.   Assessment/Plan  1. COPD with acute exacerbation  - Reports progressive SOB and cough despite completing 2 courses of antibiotics and prednisone  - Found to be tachycardic with slight elevation in troponin and CTA was performed, negative for PE or other acute findings  - BNP is normal, there is no peripheral edema, JVD, or rales  - He  reports improvement with albuterol neb but still slightly dyspneic at rest with some wheezing  - Check sputum culture, start IV Solu-Medrol and azithromycin, continue ICS/LABA and LAMA, continue as-needed albuterol nebs    2. Hypertensive urgency  - SBP 205 in ED, improved with IV labetalol  - He denies hx of HTN  - Continue as-needed treatment for now and consider starting a scheduled agent if persists    3. Troponin elevation  - Troponin is 0.03 in ED without chest pain  - CTA negative for PE  - He was severely hypertensive in ED and treated with IV labetalol  - EKG with non-specific ST-T abnormalities that do not appear significantly different than prior  - Continue cardiac monitoring, trend troponin, repeat EKG    DVT prophylaxis: Lovenox  Code Status: Full  Family Communication: Discussed with patient  Consults called: None Admission status: Observation     Vianne Bulls, MD [facility] Pager [phone]  If 7PM-7AM, please contact night-coverage [email or web address] Password [email or web address]  10/07/2018, 4:12 AM

## 2018-10-08 ENCOUNTER — Encounter: Payer: Self-pay | Admitting: Physician Assistant

## 2018-10-08 ENCOUNTER — Inpatient Hospital Stay (HOSPITAL_COMMUNITY): Payer: Medicare Other

## 2018-10-08 DIAGNOSIS — Z8701 Personal history of pneumonia (recurrent): Secondary | ICD-10-CM | POA: Diagnosis not present

## 2018-10-08 DIAGNOSIS — R0603 Acute respiratory distress: Secondary | ICD-10-CM

## 2018-10-08 DIAGNOSIS — J948 Other specified pleural conditions: Secondary | ICD-10-CM | POA: Diagnosis not present

## 2018-10-08 DIAGNOSIS — Z23 Encounter for immunization: Secondary | ICD-10-CM | POA: Diagnosis not present

## 2018-10-08 DIAGNOSIS — Z7951 Long term (current) use of inhaled steroids: Secondary | ICD-10-CM | POA: Diagnosis not present

## 2018-10-08 DIAGNOSIS — Z87891 Personal history of nicotine dependence: Secondary | ICD-10-CM | POA: Diagnosis not present

## 2018-10-08 DIAGNOSIS — I5033 Acute on chronic diastolic (congestive) heart failure: Secondary | ICD-10-CM | POA: Diagnosis not present

## 2018-10-08 DIAGNOSIS — Z9221 Personal history of antineoplastic chemotherapy: Secondary | ICD-10-CM | POA: Diagnosis not present

## 2018-10-08 DIAGNOSIS — H9191 Unspecified hearing loss, right ear: Secondary | ICD-10-CM | POA: Diagnosis present

## 2018-10-08 DIAGNOSIS — Z981 Arthrodesis status: Secondary | ICD-10-CM | POA: Diagnosis not present

## 2018-10-08 DIAGNOSIS — R0600 Dyspnea, unspecified: Secondary | ICD-10-CM

## 2018-10-08 DIAGNOSIS — N179 Acute kidney failure, unspecified: Secondary | ICD-10-CM | POA: Diagnosis not present

## 2018-10-08 DIAGNOSIS — J9601 Acute respiratory failure with hypoxia: Secondary | ICD-10-CM | POA: Diagnosis present

## 2018-10-08 DIAGNOSIS — Z9889 Other specified postprocedural states: Secondary | ICD-10-CM | POA: Diagnosis not present

## 2018-10-08 DIAGNOSIS — J439 Emphysema, unspecified: Secondary | ICD-10-CM | POA: Diagnosis present

## 2018-10-08 DIAGNOSIS — I110 Hypertensive heart disease with heart failure: Secondary | ICD-10-CM | POA: Diagnosis present

## 2018-10-08 DIAGNOSIS — R7989 Other specified abnormal findings of blood chemistry: Secondary | ICD-10-CM | POA: Diagnosis present

## 2018-10-08 DIAGNOSIS — R0609 Other forms of dyspnea: Secondary | ICD-10-CM

## 2018-10-08 DIAGNOSIS — J441 Chronic obstructive pulmonary disease with (acute) exacerbation: Secondary | ICD-10-CM | POA: Diagnosis not present

## 2018-10-08 DIAGNOSIS — Z8571 Personal history of Hodgkin lymphoma: Secondary | ICD-10-CM | POA: Diagnosis not present

## 2018-10-08 DIAGNOSIS — Z923 Personal history of irradiation: Secondary | ICD-10-CM | POA: Diagnosis not present

## 2018-10-08 DIAGNOSIS — I161 Hypertensive emergency: Secondary | ICD-10-CM | POA: Diagnosis present

## 2018-10-08 DIAGNOSIS — K219 Gastro-esophageal reflux disease without esophagitis: Secondary | ICD-10-CM | POA: Diagnosis present

## 2018-10-08 DIAGNOSIS — Z79899 Other long term (current) drug therapy: Secondary | ICD-10-CM | POA: Diagnosis not present

## 2018-10-08 DIAGNOSIS — J90 Pleural effusion, not elsewhere classified: Secondary | ICD-10-CM | POA: Diagnosis present

## 2018-10-08 HISTORY — PX: IR THORACENTESIS ASP PLEURAL SPACE W/IMG GUIDE: IMG5380

## 2018-10-08 LAB — BASIC METABOLIC PANEL
ANION GAP: 13 (ref 5–15)
BUN: 14 mg/dL (ref 8–23)
CO2: 26 mmol/L (ref 22–32)
Calcium: 9.1 mg/dL (ref 8.9–10.3)
Chloride: 101 mmol/L (ref 98–111)
Creatinine, Ser: 1.17 mg/dL (ref 0.61–1.24)
GFR calc Af Amer: >60 mL/min (ref >60)
GFR calc non Af Amer: >60 mL/min (ref >60)
Glucose, Bld: 160 mg/dL — ABNORMAL HIGH (ref 70–99)
Potassium: 4 mmol/L (ref 3.5–5.1)
Sodium: 140 mmol/L (ref 135–145)

## 2018-10-08 LAB — BODY FLUID CELL COUNT WITH DIFFERENTIAL
Eos, Fluid: 0 %
Lymphs, Fluid: 54 %
Monocyte-Macrophage-Serous Fluid: 41 % — ABNORMAL LOW (ref 50–90)
Neutrophil Count, Fluid: 5 % (ref 0–25)
Other Cells, Fluid: 1 %
WBC FLUID: 572 uL (ref 0–1000)

## 2018-10-08 LAB — LACTATE DEHYDROGENASE, PLEURAL OR PERITONEAL FLUID: LD, Fluid: 86 U/L — ABNORMAL HIGH (ref 3–23)

## 2018-10-08 LAB — CBC
HCT: 35.8 % — ABNORMAL LOW (ref 39.0–52.0)
Hemoglobin: 11.4 g/dL — ABNORMAL LOW (ref 13.0–17.0)
MCH: 31.1 pg (ref 26.0–34.0)
MCHC: 31.8 g/dL (ref 30.0–36.0)
MCV: 97.5 fL (ref 80.0–100.0)
Platelets: 139 10*3/uL — ABNORMAL LOW (ref 150–400)
RBC: 3.67 MIL/uL — AB (ref 4.22–5.81)
RDW: 13.3 % (ref 11.5–15.5)
WBC: 5.5 10*3/uL (ref 4.0–10.5)
nRBC: 0 % (ref 0.0–0.2)

## 2018-10-08 LAB — GRAM STAIN

## 2018-10-08 LAB — LACTATE DEHYDROGENASE: LDH: 126 U/L (ref 98–192)

## 2018-10-08 LAB — PROTEIN, PLEURAL OR PERITONEAL FLUID: Total protein, fluid: <3 g/dL

## 2018-10-08 MED ORDER — LIDOCAINE HCL 1 % IJ SOLN
INTRAMUSCULAR | Status: DC | PRN
  Administered 2018-10-08: 10 mL

## 2018-10-08 MED ORDER — LIDOCAINE HCL 1 % IJ SOLN
INTRAMUSCULAR | Status: AC
  Administered 2018-10-08: 11:00:00
  Filled 2018-10-08: qty 20

## 2018-10-08 NOTE — Progress Notes (Signed)
Progress Note    Chase Lloyd  OVF:[id number] DOB: January 26, 1956  DOA: 10/06/2018 PCP: [name], NP    Brief Narrative:    Medical records reviewed and are as summarized below:  Chase Lloyd is an 62 y.o. male with medical history significant for emphysema, tobacco abuse in remission, and degenerative disc disease, now presenting to the emergency department for evaluation of progressive shortness of breath and cough despite completing 2 courses of antibiotics and prednisone over the past 1 month.  Patient reports that his chronic dyspnea and cough had worsened a little over a month ago, he was seen by his PCP at that time, started on doxycycline and prednisone, failed to have any significant improvement, and then followed up with his pulmonologist about 2 weeks ago.  Pulmonologist started Levaquin and prednisone, but he continued to have increased shortness of breath and cough.  There has not been any chest pain, fevers, chills, or leg swelling or tenderness associated with this.  He underwent a lung cancer screening CT on 10/04/2018 with possible pneumonia versus atelectasis versus neoplasm, as well as left pleural effusion and suspected interstitial edema.  He was contacted by his pulmonology clinic and directed to the ED for evaluation of this.   Assessment/Plan:   Principal Problem:   COPD with acute exacerbation (HCC) Active Problems:   Acute respiratory distress   Hypertensive urgency   Troponin I above reference range   Dyspnea   Acute respiratory failure -home O2 eval before discharge-- may go home on home O2  COPD with acute exacerbation - severe emphysema - Reports progressive SOB and cough despite completing 2 courses of antibiotics and prednisone  - Found to be tachycardic with slight elevation in troponin and CTA was performed, negative for PE or other acute findings  - BNP is normal, there is no peripheral edema, JVD, or rales  - echo: normal EF, grade 1  diastolic, normal wall motion IV steroids-- wean to PO As able  Small pleural effusion -s/p drainage in IR -350cc removed -as he was wheezing throughout, doubt will improve much with this -await studies/culture   Hypertensive urgency  - SBP 205 in ED - He denies hx of HTN  - Continue as-needed treatment for now and consider starting a scheduled agent if persists    Troponin elevation  - CE negative   Family Communication/Anticipated D/C date and plan/Code Status   DVT prophylaxis: Lovenox ordered. Code Status: Full Code.  Family Communication: daughter on phone Disposition Plan: pending improvement   Medical Consultants:    IR   pulm     Subjective:   Told me his breathing was not better, told PA for pulm he was doing better  Objective:    Vitals:   10/07/18 2216 10/08/18 0251 10/08/18 0740 10/08/18 0825  BP: (!) 147/66  132/70   Pulse: (!) 105  (!) 101   Resp: 20  15   Temp: 98.1 F (36.7 C)  97.6 F (36.4 C)   TempSrc: Oral  Oral   SpO2: 96% 94% 98% 97%  Weight:      Height:        Intake/Output Summary (Last 24 hours) at 10/08/2018 1337 Last data filed at 10/08/2018 0900 Gross per 24 hour  Intake 368 ml  Output 350 ml  Net 18 ml   Filed Weights   10/06/18 1806  Weight: 81.9 kg    Exam: In bed with O2 on  Mild increase in work of  breathing at rest Wheezing in all lung fields No LE edema +BS, soft rrr  Data Reviewed:   I have personally reviewed following labs and imaging studies:  Labs: Labs show the following:   Basic Metabolic Panel: Recent Labs  Lab 10/06/18 1833 10/07/18 0515 10/08/18 0240  NA 139 139 140  K 3.8 3.5 4.0  CL 104 102 101  CO2 27 25 26   GLUCOSE 99 90 160*  BUN 12 12 14   CREATININE 1.23 0.93 1.17  CALCIUM 9.2 9.0 9.1   GFR Estimated Creatinine Clearance: 65.9 mL/min (by C-G formula based on SCr of 1.17 mg/dL). Liver Function Tests: Recent Labs  Lab 10/06/18 1833  AST 31  ALT 26  ALKPHOS 84    BILITOT 0.7  PROT 6.6  ALBUMIN 3.4*   No results for input(s): LIPASE, AMYLASE in the last 168 hours. No results for input(s): AMMONIA in the last 168 hours. Coagulation profile Recent Labs  Lab 10/06/18 1833  INR 0.96    CBC: Recent Labs  Lab 10/06/18 1833 10/08/18 0240  WBC 5.8 5.5  NEUTROABS 4.2  --   HGB 11.8* 11.4*  HCT 38.4* 35.8*  MCV 99.0 97.5  PLT 154 139*   Cardiac Enzymes: Recent Labs  Lab 10/06/18 2114 10/07/18 0515 10/07/18 1004 10/07/18 1549  TROPONINI 0.03* <0.03 <0.03 <0.03   BNP (last 3 results) No results for input(s): PROBNP in the last 8760 hours. CBG: No results for input(s): GLUCAP in the last 168 hours. D-Dimer: No results for input(s): DDIMER in the last 72 hours. Hgb A1c: No results for input(s): HGBA1C in the last 72 hours. Lipid Profile: No results for input(s): CHOL, HDL, LDLCALC, TRIG, CHOLHDL, LDLDIRECT in the last 72 hours. Thyroid function studies: No results for input(s): TSH, T4TOTAL, T3FREE, THYROIDAB in the last 72 hours.  Invalid input(s): FREET3 Anemia work up: No results for input(s): VITAMINB12, FOLATE, FERRITIN, TIBC, IRON, RETICCTPCT in the last 72 hours. Sepsis Labs: Recent Labs  Lab 10/06/18 1833 10/06/18 1839 10/06/18 2155 10/08/18 0240  WBC 5.8  --   --  5.5  LATICACIDVEN  --  0.94 1.34  --     Microbiology Recent Results (from the past 240 hour(s))  Culture, blood (Routine x 2)     Status: None (Preliminary result)   Collection Time: 10/06/18  6:33 PM  Result Value Ref Range Status   Specimen Description BLOOD BLOOD RIGHT WRIST  Final   Special Requests   Final    BOTTLES DRAWN AEROBIC AND ANAEROBIC Blood Culture adequate volume   Culture   Final    NO GROWTH 2 DAYS Performed at [facility], [address] [place], [place] [address]    Report Status PENDING  Incomplete  Culture, blood (Routine x 2)     Status: None (Preliminary result)   Collection Time: 10/06/18  8:32 PM  Result Value  Ref Range Status   Specimen Description BLOOD RIGHT FOREARM  Final   Special Requests   Final    BOTTLES DRAWN AEROBIC AND ANAEROBIC Blood Culture results may not be optimal due to an inadequate volume of blood received in culture bottles   Culture   Final    NO GROWTH 2 DAYS Performed at [facility], [place] [address] [place], [place] [address]    Report Status PENDING  Incomplete  Gram stain     Status: None   Collection Time: 10/08/18 11:45 AM  Result Value Ref Range Status   Specimen  Description PLEURAL  Final   Special Requests NONE  Final   Gram Stain   Final    ABUNDANT WBC PRESENT, PREDOMINANTLY MONONUCLEAR NO ORGANISMS SEEN Performed at [facility], [place] [address] [place], [place] [address]    Report Status 10/08/2018 FINAL  Final    Procedures and diagnostic studies:  Dg Chest 1 View  Result Date: 10/08/2018 CLINICAL DATA:  Status post thoracentesis. EXAM: CHEST  1 VIEW COMPARISON:  Radiograph of October 07, 2018. FINDINGS: The heart size and mediastinal contours are within normal limits. No pneumothorax is noted. Right pleural effusion is significantly smaller status post thoracentesis. Stable scarring is noted in right upper lobe. No acute pulmonary disease is noted. The visualized skeletal structures are unremarkable. IMPRESSION: No pneumothorax status post right-sided thoracentesis. Electronically Signed   By: Marijo Conception, M.D.   On: 10/08/2018 12:19   Dg Chest 2 View  Result Date: 10/06/2018 CLINICAL DATA:  62 year old male with pneumonia. EXAM: CHEST - 2 VIEW COMPARISON:  Chest CT dated 10/04/2018 FINDINGS: There is emphysematous changes of the lungs and diffuse interstitial coarsening and mild chronic bronchitic changes. There is a small right pleural effusion. There is no focal consolidation. Slight increased density over the right mid to lower lung field similar to prior radiograph likely atelectasis. Developing infiltrate is not excluded. Clinical  correlation is recommended. No pneumothorax. The cardiac silhouette is within normal limits. No acute osseous pathology. Cervical fixation hardware. IMPRESSION: 1. Small right pleural effusion and probable right lung base atelectasis. Developing infiltrate is not excluded. 2. Emphysema. Electronically Signed   By: Anner Crete M.D.   On: 10/06/2018 21:17   Ct Angio Chest Pe W And/or Wo Contrast  Result Date: 10/06/2018 CLINICAL DATA:  Shortness of breath EXAM: CT ANGIOGRAPHY CHEST WITH CONTRAST TECHNIQUE: Multidetector CT imaging of the chest was performed using the standard protocol during bolus administration of intravenous contrast. Multiplanar CT image reconstructions and MIPs were obtained to evaluate the vascular anatomy. CONTRAST:  41mL ISOVUE-370 IOPAMIDOL (ISOVUE-370) INJECTION 76% COMPARISON:  None. FINDINGS: Cardiovascular: No filling defects in the pulmonary arteries to suggest pulmonary emboli. Diffuse coronary artery calcifications. Moderate aortic calcifications. No aneurysm. Heart is normal size. Mediastinum/Nodes: No mediastinal, hilar, or axillary adenopathy. Lungs/Pleura: Small bilateral pleural effusions. Advanced emphysema. Postoperative changes in the right upper lobe. Bibasilar and biapical scarring. Upper Abdomen: Imaging into the upper abdomen shows no acute findings. Musculoskeletal: Chest wall soft tissues are unremarkable. No acute bony abnormality Review of the MIP images confirms the above findings. IMPRESSION: Advanced emphysema. Postoperative changes in the right upper lobe. Small bilateral pleural effusions. Coronary artery disease, aortic atherosclerosis. No evidence of pulmonary embolus. Electronically Signed   By: Rolm Baptise M.D.   On: 10/06/2018 22:12   Dg Chest Right Decubitus  Result Date: 10/07/2018 CLINICAL DATA:  Pleural effusion EXAM: CHEST - RIGHT DECUBITUS COMPARISON:  Chest x-ray and CT 10/06/2018 FINDINGS: Layering small right pleural effusion noted on  decubitus view. IMPRESSION: Small layering right pleural effusion. Electronically Signed   By: Rolm Baptise M.D.   On: 10/07/2018 20:25   Ir Thoracentesis Asp Pleural Space W/img Guide  Result Date: 10/08/2018 Lynnea Ferrier     10/08/2018 11:35 AM PROCEDURE SUMMARY: Successful image-guided right thoracentesis. Yielded 350 milliliters of golden yellow fluid. Patient tolerated procedure well. No immediate complications. Specimen was sent for labs. CXR ordered. Please see imaging section of Epic for full dictation. Joaquim Nam PA-C 10/08/2018 11:35 AM  Medications:   . enoxaparin (LOVENOX) injection  40 mg Subcutaneous Q24H  . Influenza vac split quadrivalent PF  0.5 mL Intramuscular Tomorrow-1000  . ipratropium-albuterol  3 mL Nebulization Q6H  . lidocaine      . methylPREDNISolone (SOLU-MEDROL) injection  60 mg Intravenous Q6H  . mometasone-formoterol  2 puff Inhalation BID  . pantoprazole  40 mg Oral Daily  . sodium chloride flush  3 mL Intravenous Q12H   Continuous Infusions: . sodium chloride    . azithromycin Stopped (10/08/18 0603)     LOS: 0 days   Geradine Girt  [facility]   *Please refer to [email or web address], password [email or web address] to get updated schedule on who will round on this patient, as hospitalists switch teams weekly. If 7PM-7AM, please contact night-coverage at [email or web address], password [email or web address] for any overnight needs.  10/08/2018, 1:37 PM

## 2018-10-08 NOTE — Consult Note (Signed)
NAME:  Chase Lloyd, MRN:  [id number], DOB:  11/29/1955, LOS: 0 ADMISSION DATE:  10/06/2018, CONSULTATION DATE: 10/08/2018 REFERRING MD: [facility], CHIEF COMPLAINT: Dyspnea refractory to current treatment  Brief History   62 year old male with gold stage III COPD history of Hodgkin's disease with chemoradiation 1999 is having increasing dyspnea over last 3 months following back surgery and pneumonia. Quit smoking 03/2017 -  Spirometry 05/29/2017  FEV1 1.37 (39%)  Ratio 47 p am symb x2 and spirva x one and abn effort dep portion  - 05/29/2017   > rec  change  spiriva to  respimat and symb 160 2bid s spacer   - PFT's  07/30/2017  FEV1 1.39 (38 % ) ratio 48  p 23 % improvement from saba p nothing prior to study with DLCO  39/42 % corrects to 59 % for alv volume   History of present illness   Chase Lloyd is a 62 year old male 2 pack-a-day smoker age 67 to age 15 having quit in May 2019 after being informed he had cold stage III COPD by Dr. Melvyn Novas.  Of note he has a past medical history significant for Hodgkin's disease in 1999 at which time he underwent chemo and radiation without further event.  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  Has had multiple pneumonias in 2000 and required a chest tube for empyema. Prior to September this year he was able to walk 100 feet without being short of breath as documented.  September he had back surgery with lumbar fusion and had a hematoma following surgery that required another 4 to 5 days stay in the hospital.  He is noted since that time he is more short of breath and a daily basis.  He has been evaluated by Dr. Melvyn Novas is found to have gold stage III COPD as documented by PFTs and CT scans and was undergoing further work-up for cancer screening and noted to have suspicious mass on the chest CT will require further work-up in the future.  Since September is noticed increasing shortness of breath with fever.  He had  2 courses of antimicrobial therapy as an outpatient prior to having a CT scan.  He was admitted for further evaluation and treatment after CT scan showed bilateral pleural effusions and scarring of the lungs.  He has been treated with steroids antimicrobial therapy bronchodilators and inhaled steroids and reports being better. There was a concern of congestive heart failure due to bilateral pleural effusions therefore he had a 2D echo performed on 10/07/2018 which demonstrated left ventricular ejection fraction of 60 to 65%, left ventricular size was normal there was mild mitral valve regurgitation.  Right ventricle systolic function was normal.  Did demonstrate a grade 1 diastolic dysfunction.  10/08/2018 pulmonary critical care was asked to evaluate, he reports he is doing better on a very efficient pharmaceutical regimen this been instituted by the [facility] hospitalist team.  Chase Lloyd will need to follow-up with pulmonary as an outpatient.  Okay to start weaning his FiO2 was also sats are greater than 88%.  Would suggest a walk test prior to discharge to confirm that his O2 sats remained greater than 88% if they do not he qualifies for oxygen if his O2 sats are less than 88%.  I suspect he said adequate antimicrobial therapy at this point.  He can probably transition to p.o. steroids and tapered off.  Past Medical History  Gold stage  III COPD Emphysema Degenerative joint disease with posterior cervical fusion and lumbar fusion that was completed September 2019 1999 Hodgkin's disease treated with chemo and radiation Empyema 2000 treated with chest tube drainage Frequent pneumonias 10/04/2018 CT the chest shows right upper lobe suspicious mass and evaluation of lung cancer   Significant Hospital Events     Consults:  10/08/2018 pulmonary  Procedures:    Significant Diagnostic Tests:  Quit smoking 03/2017 -  Spirometry 05/29/2017  FEV1 1.37 (39%)  Ratio 47 p am symb x2 and spirva x one and abn  effort dep portion  - 05/29/2017   > rec  change  spiriva to  respimat and symb 160 2bid s spacer   - PFT's  07/30/2017  FEV1 1.39 (38 % ) ratio 48  p 23 % improvement from saba p nothing prior to study with DLCO  39/42 % corrects to 59 % for alv volume  Micro Data:    Antimicrobials:  02/05/2018 doxycycline>>  Interim history/subjective:  63 year old male who is currently at day 2 of treatment with improved dyspnea and responding to current treatment  Objective   Blood pressure 132/70, pulse (!) 101, temperature 97.6 F (36.4 C), temperature source Oral, resp. rate 15, height 5\' 10"  (1.778 m), weight 81.9 kg, SpO2 97 %.        Intake/Output Summary (Last 24 hours) at 10/08/2018 1107 Last data filed at 10/08/2018 0900 Gross per 24 hour  Intake 368 ml  Output 350 ml  Net 18 ml   Filed Weights   10/06/18 1806  Weight: 81.9 kg    Examination: General: Well-developed developed well-nourished male no acute distress at rest HENT: Currently on nasal cannula with O2 sats greater than 96%, no JVD lymphadenopathy is Lungs: Creased air movement is noted, poor excursion, mild barrel chested.  Faint expiratory wheezes are appreciated Cardiovascular: Heart sounds are regular regular rate and rhythm Abdomen: Abdomen soft nontender positive bowel sounds Extremities: Warm dry no overt edema Neuro: Appears intact follows commands moves all extremities speech is clear GU: Not examined  Resolved Hospital Problem list     Assessment & Plan:  Severe dyspnea in setting of gold stage III COPD with recent pneumonia bilateral pleural effusions, recent CT suspicion for right upper lobe mass.  In setting of lifelong tobacco abuse, history of Hodgkin's disease in 1999 with radiation chemo, history of empyema with right chest tube secondary to pneumonia. Recent pneumonia 2019 October Bilateral pleural effusions on CT scan  -Agree with current treatment antimicrobials, steroids, O2 therapy and  bronchodilators.  In his current treatment for 2 days reports breathing better. -Wean FiO2 for sats greater than 88%. -He will need follow-up with Dr. Melvyn Novas as an outpatient. -Treat chronic pain -Wean steroids to p.o. and taper over a 14-day. -Suspect he has had adequate antimicrobial therapy over the last month or 2 with multiple antimicrobial regimens have been instituted by 3 different groups of physicians.  Degenerative joint disease of the recent lumbar fusion with a history of posterior cervical fusion.  Activities been limited secondary to back pain unable to perform walk test. -Continue to treat chronic pain  History of Hodgkin's disease with radiation and chemo 1999 no acute treatment required -No acute interventions required .  Hypertension -Treatment as ordered  Best practice:  Diet: Heart healthy Pain/Anxiety/Delirium protocol (if indicated): Indicated VAP protocol (if indicated): None indicated DVT prophylaxis: Lovenox GI prophylaxis: Protonix Glucose control: None indicated Mobility: As tolerated Code Status: Full Family Communication: 10/08/2018 patient updated  at bedside Disposition: Remains in the progressive care unit no acute distress  Labs   CBC: Recent Labs  Lab 10/06/18 1833 10/08/18 0240  WBC 5.8 5.5  NEUTROABS 4.2  --   HGB 11.8* 11.4*  HCT 38.4* 35.8*  MCV 99.0 97.5  PLT 154 139*    Basic Metabolic Panel: Recent Labs  Lab 10/06/18 1833 10/07/18 0515 10/08/18 0240  NA 139 139 140  K 3.8 3.5 4.0  CL 104 102 101  CO2 27 25 26   GLUCOSE 99 90 160*  BUN 12 12 14   CREATININE 1.23 0.93 1.17  CALCIUM 9.2 9.0 9.1   GFR: Estimated Creatinine Clearance: 65.9 mL/min (by C-G formula based on SCr of 1.17 mg/dL). Recent Labs  Lab 10/06/18 1833 10/06/18 1839 10/06/18 2155 10/08/18 0240  WBC 5.8  --   --  5.5  LATICACIDVEN  --  0.94 1.34  --     Liver Function Tests: Recent Labs  Lab 10/06/18 1833  AST 31  ALT 26  ALKPHOS 84  BILITOT  0.7  PROT 6.6  ALBUMIN 3.4*   No results for input(s): LIPASE, AMYLASE in the last 168 hours. No results for input(s): AMMONIA in the last 168 hours.  ABG No results found for: PHART, PCO2ART, PO2ART, HCO3, TCO2, ACIDBASEDEF, O2SAT   Coagulation Profile: Recent Labs  Lab 10/06/18 1833  INR 0.96    Cardiac Enzymes: Recent Labs  Lab 10/06/18 2114 10/07/18 0515 10/07/18 1004 10/07/18 1549  TROPONINI 0.03* <0.03 <0.03 <0.03    HbA1C: No results found for: HGBA1C  CBG: No results for input(s): GLUCAP in the last 168 hours.  Review of Systems:   10 point review of system taken, please see HPI for positives and negatives.   Past Medical History  He,  has a past medical history of Back pain, COPD (chronic obstructive pulmonary disease) ([place]), DDD (degenerative disc disease), lumbar, Depression, Emphysema of lung ([place]), Empyema ([place]), GERD (gastroesophageal reflux disease), Hearing loss of right ear, Hodgkin's disease ([place]) (1999), Hypotension, Meniere's disease, Pneumonia, and Tinnitus.   Surgical History    Past Surgical History:  Procedure Laterality Date  . BACK SURGERY    . CERVICAL FUSION  2016  . ELBOW SURGERY    . EYE SURGERY    . LAMINECTOMY  1999  . lower back surgery    . TONSILLECTOMY       Social History   reports that he quit smoking about 3 months ago. His smoking use included cigarettes. He has a 70.50 pack-year smoking history. He has never used smokeless tobacco. He reports that he drinks alcohol. He reports that he does not use drugs.   Family History   His family history includes Diabetes in his father; Hypertension in his father; Melanoma in his brother; Testicular cancer in his brother.   Allergies No Known Allergies   Home Medications  Prior to Admission medications   Medication Sig Start Date End Date Taking? Authorizing Provider  albuterol (PROVENTIL) (2.5 MG/3ML) 0.083% nebulizer solution Take 3 mLs (2.5 mg total) by nebulization every  6 (six) hours as needed for wheezing or shortness of breath. 09/21/18  Yes Tanda Rockers, MD  ibuprofen (ADVIL,MOTRIN) 800 MG tablet Take 800 mg by mouth every 8 (eight) hours as needed (for pain.).    Yes [provider]  omeprazole (PRILOSEC) 40 MG capsule Take 1 capsule (40 mg total) by mouth daily. 04/26/18  Yes Nafziger, Tommi Rumps, NP  SYMBICORT 160-4.5 MCG/ACT inhaler Inhale 2 puffs  into the lungs 2 (two) times daily. 09/09/18  Yes Tanda Rockers, MD  Tiotropium Bromide Monohydrate (SPIRIVA RESPIMAT) 2.5 MCG/ACT AERS Inhale 2 puffs into the lungs daily. 09/10/18  Yes Lauraine Rinne, NP         Richardson Landry Minor ACNP Maryanna Shape PCCM Pager [phone] till 1 pm If no answer page [phone] 10/08/2018, 11:07 AM

## 2018-10-08 NOTE — Procedures (Signed)
PROCEDURE SUMMARY:  Successful image-guided right thoracentesis. Yielded 350 milliliters of golden yellow fluid. Patient tolerated procedure well. No immediate complications.  Specimen was sent for labs. CXR ordered.  Please see imaging section of Epic for full dictation.  Joaquim Nam PA-C 10/08/2018 11:35 AM

## 2018-10-09 DIAGNOSIS — J90 Pleural effusion, not elsewhere classified: Secondary | ICD-10-CM

## 2018-10-09 LAB — BASIC METABOLIC PANEL
ANION GAP: 11 (ref 5–15)
BUN: 18 mg/dL (ref 8–23)
CO2: 26 mmol/L (ref 22–32)
Calcium: 9.1 mg/dL (ref 8.9–10.3)
Chloride: 102 mmol/L (ref 98–111)
Creatinine, Ser: 1.11 mg/dL (ref 0.61–1.24)
GFR calc Af Amer: >60 mL/min (ref >60)
GFR calc non Af Amer: >60 mL/min (ref >60)
Glucose, Bld: 136 mg/dL — ABNORMAL HIGH (ref 70–99)
Potassium: 4.3 mmol/L (ref 3.5–5.1)
Sodium: 139 mmol/L (ref 135–145)

## 2018-10-09 LAB — CBC
HCT: 35.7 % — ABNORMAL LOW (ref 39.0–52.0)
Hemoglobin: 11.2 g/dL — ABNORMAL LOW (ref 13.0–17.0)
MCH: 31.3 pg (ref 26.0–34.0)
MCHC: 31.4 g/dL (ref 30.0–36.0)
MCV: 99.7 fL (ref 80.0–100.0)
NRBC: 0 % (ref 0.0–0.2)
Platelets: 146 10*3/uL — ABNORMAL LOW (ref 150–400)
RBC: 3.58 MIL/uL — ABNORMAL LOW (ref 4.22–5.81)
RDW: 13.7 % (ref 11.5–15.5)
WBC: 9.7 10*3/uL (ref 4.0–10.5)

## 2018-10-09 MED ORDER — FUROSEMIDE 10 MG/ML IJ SOLN
20.0000 mg | Freq: Once | INTRAMUSCULAR | Status: AC
  Administered 2018-10-09: 20 mg via INTRAVENOUS
  Filled 2018-10-09: qty 2

## 2018-10-09 NOTE — Progress Notes (Signed)
Patient did not want to be woken at this time for breathing treatment this evening.

## 2018-10-09 NOTE — Plan of Care (Signed)

## 2018-10-09 NOTE — Progress Notes (Signed)
Progress Note    Chase Lloyd  HLK:[id number] DOB: [birth year]  DOA: 10/06/2018 PCP: [name], NP    Brief Narrative:    Medical records reviewed and are as summarized below:  Chase Lloyd is an 62 y.o. male with medical history significant for emphysema, tobacco abuse in remission, and degenerative disc disease, now presenting to the emergency department for evaluation of progressive shortness of breath and cough despite completing 2 courses of antibiotics and prednisone over the past 1 month.  Patient reports that his chronic dyspnea and cough had worsened a little over a month ago, he was seen by his PCP at that time, started on doxycycline and prednisone, failed to have any significant improvement, and then followed up with his pulmonologist about 2 weeks ago.  Pulmonologist started Levaquin and prednisone, but he continued to have increased shortness of breath and cough.  There has not been any chest pain, fevers, chills, or leg swelling or tenderness associated with this.  He underwent a lung cancer screening CT on 10/04/2018 with possible pneumonia versus atelectasis versus neoplasm, as well as left pleural effusion and suspected interstitial edema.  He was contacted by his pulmonology clinic and directed to the ED for evaluation of this.  Subjective:   Still report some dyspnea, cough, nonproductive  Assessment/Plan:   Principal Problem:   COPD with acute exacerbation (HCC) Active Problems:   Acute respiratory distress   Hypertensive urgency   Troponin I above reference range   Dyspnea   Acute respiratory failure -Still with increased work of breathing, diffuse wheezing, requiring oxygen, likely will need to go home with home oxygen  COPD with acute exacerbation - severe emphysema - Reports progressive SOB and cough despite completing 2 courses of antibiotics and prednisone  - Found to be tachycardic with slight elevation in troponin and CTA was performed,  negative for PE or other acute findings  - BNP is normal, there is no peripheral edema, JVD, or rales  - echo: normal EF, grade 1 diastolic, normal wall motion -Continue with IV steroids, significantly dyspneic, with diminished air entry and wheezing today, no tapering today , continue with Dulera and nebs  Small pleural effusion -s/p drainage in IR -350cc removed -as he was wheezing throughout, doubt will improve much with this -Appears to have mixed picture, Exudative by LDH criteria 86/126> 0.68 muscle for Gram stain and cultures appear negative   Hypertensive urgency  -Blood pressure elevated on admission, most likely in the setting of respiratory distress, currently blood pressure controlled without any medications   Troponin elevation  - CE negative   Family Communication/Anticipated D/C date and plan/Code Status   DVT prophylaxis: Lovenox ordered. Code Status: Full Code.  Family Communication: daughter on phone Disposition Plan: pending improvement   Medical Consultants:    IR   pulm       Objective:    Vitals:   10/08/18 2136 10/08/18 2349 10/09/18 0731 10/09/18 0935  BP:  140/70 139/73   Pulse:  (!) 105 (!) 102   Resp:  18 18   Temp:  98.1 F (36.7 C) 97.8 F (36.6 C)   TempSrc:  Oral Oral   SpO2: 96% 99% 99% 99%  Weight:      Height:        Intake/Output Summary (Last 24 hours) at 10/09/2018 1534 Last data filed at 10/09/2018 0845 Gross per 24 hour  Intake 240 ml  Output -  Net 240 ml   [employer]  10/06/18 1806  Weight: 81.9 kg    Exam: Awake Alert, Oriented X 3, No new F.N deficits, Normal affect Symmetrical Chest wall movement, diminished air entry, significant wheezing, has increased work of breathing RRR,No Gallops,Rubs or new Murmurs, No Parasternal Heave +ve B.Sounds, Abd Soft, No tenderness, No rebound - guarding or rigidity. No Cyanosis, Clubbing or edema, No new Rash or bruise     Data Reviewed:   I have personally  reviewed following labs and imaging studies:  Labs: Labs show the following:   Basic Metabolic Panel: Recent Labs  Lab 10/06/18 1833 10/07/18 0515 10/08/18 0240 10/09/18 0301  NA 139 139 140 139  K 3.8 3.5 4.0 4.3  CL 104 102 101 102  CO2 27 25 26 26   GLUCOSE 99 90 160* 136*  BUN 12 12 14 18   CREATININE 1.23 0.93 1.17 1.11  CALCIUM 9.2 9.0 9.1 9.1   GFR Estimated Creatinine Clearance: 69.4 mL/min (by C-G formula based on SCr of 1.11 mg/dL). Liver Function Tests: Recent Labs  Lab 10/06/18 1833  AST 31  ALT 26  ALKPHOS 84  BILITOT 0.7  PROT 6.6  ALBUMIN 3.4*   No results for input(s): LIPASE, AMYLASE in the last 168 hours. No results for input(s): AMMONIA in the last 168 hours. Coagulation profile Recent Labs  Lab 10/06/18 1833  INR 0.96    CBC: Recent Labs  Lab 10/06/18 1833 10/08/18 0240 10/09/18 0301  WBC 5.8 5.5 9.7  NEUTROABS 4.2  --   --   HGB 11.8* 11.4* 11.2*  HCT 38.4* 35.8* 35.7*  MCV 99.0 97.5 99.7  PLT 154 139* 146*   Cardiac Enzymes: Recent Labs  Lab 10/06/18 2114 10/07/18 0515 10/07/18 1004 10/07/18 1549  TROPONINI 0.03* <0.03 <0.03 <0.03   BNP (last 3 results) No results for input(s): PROBNP in the last 8760 hours. CBG: No results for input(s): GLUCAP in the last 168 hours. D-Dimer: No results for input(s): DDIMER in the last 72 hours. Hgb A1c: No results for input(s): HGBA1C in the last 72 hours. Lipid Profile: No results for input(s): CHOL, HDL, LDLCALC, TRIG, CHOLHDL, LDLDIRECT in the last 72 hours. Thyroid function studies: No results for input(s): TSH, T4TOTAL, T3FREE, THYROIDAB in the last 72 hours.  Invalid input(s): FREET3 Anemia work up: No results for input(s): VITAMINB12, FOLATE, FERRITIN, TIBC, IRON, RETICCTPCT in the last 72 hours. Sepsis Labs: Recent Labs  Lab 10/06/18 1833 10/06/18 1839 10/06/18 2155 10/08/18 0240 10/09/18 0301  WBC 5.8  --   --  5.5 9.7  LATICACIDVEN  --  0.94 1.34  --   --      Microbiology Recent Results (from the past 240 hour(s))  Culture, blood (Routine x 2)     Status: None (Preliminary result)   Collection Time: 10/06/18  6:33 PM  Result Value Ref Range Status   Specimen Description BLOOD BLOOD RIGHT WRIST  Final   Special Requests   Final    BOTTLES DRAWN AEROBIC AND ANAEROBIC Blood Culture adequate volume   Culture   Final    NO GROWTH 3 DAYS Performed at [facility], [address], [place], [place] [address]    Report Status PENDING  Incomplete  Culture, blood (Routine x 2)     Status: None (Preliminary result)   Collection Time: 10/06/18  8:32 PM  Result Value Ref Range Status   Specimen Description BLOOD RIGHT FOREARM  Final   Special Requests   Final    BOTTLES DRAWN AEROBIC  AND ANAEROBIC Blood Culture results may not be optimal due to an inadequate volume of blood received in culture bottles   Culture   Final    NO GROWTH 3 DAYS Performed at [facility], [place] [address], [place], [place] [address]    Report Status PENDING  Incomplete  Gram stain     Status: None   Collection Time: 10/08/18 11:45 AM  Result Value Ref Range Status   Specimen Description PLEURAL  Final   Special Requests NONE  Final   Gram Stain   Final    ABUNDANT WBC PRESENT, PREDOMINANTLY MONONUCLEAR NO ORGANISMS SEEN Performed at [facility], [address] [place], [place] [address]    Report Status 10/08/2018 FINAL  Final  Culture, body fluid-bottle     Status: None (Preliminary result)   Collection Time: 10/08/18 12:12 PM  Result Value Ref Range Status   Specimen Description PLEURAL  Final   Special Requests NONE  Final   Culture   Final    NO GROWTH 1 DAY Performed at [facility], [place] [address], [place], [place] [address]    Report Status PENDING  Incomplete    Procedures and diagnostic studies:  Dg Chest 1 View  Result Date: 10/08/2018 CLINICAL DATA:  Status post thoracentesis. EXAM: CHEST  1 VIEW COMPARISON:   Radiograph of October 07, 2018. FINDINGS: The heart size and mediastinal contours are within normal limits. No pneumothorax is noted. Right pleural effusion is significantly smaller status post thoracentesis. Stable scarring is noted in right upper lobe. No acute pulmonary disease is noted. The visualized skeletal structures are unremarkable. IMPRESSION: No pneumothorax status post right-sided thoracentesis. Electronically Signed   By: Marijo Conception, M.D.   On: 10/08/2018 12:19   Dg Chest Right Decubitus  Result Date: 10/07/2018 CLINICAL DATA:  Pleural effusion EXAM: CHEST - RIGHT DECUBITUS COMPARISON:  Chest x-ray and CT 10/06/2018 FINDINGS: Layering small right pleural effusion noted on decubitus view. IMPRESSION: Small layering right pleural effusion. Electronically Signed   By: Rolm Baptise M.D.   On: 10/07/2018 20:25   Ir Thoracentesis Asp Pleural Space W/img Guide  Result Date: 10/08/2018 Lynnea Ferrier     10/08/2018 11:35 AM PROCEDURE SUMMARY: Successful image-guided right thoracentesis. Yielded 350 milliliters of golden yellow fluid. Patient tolerated procedure well. No immediate complications. Specimen was sent for labs. CXR ordered. Please see imaging section of Epic for full dictation. Joaquim Nam PA-C 10/08/2018 11:35 AM    Medications:   . enoxaparin (LOVENOX) injection  40 mg Subcutaneous Q24H  . furosemide  20 mg Intravenous Once  . Influenza vac split quadrivalent PF  0.5 mL Intramuscular Tomorrow-1000  . ipratropium-albuterol  3 mL Nebulization Q6H  . methylPREDNISolone (SOLU-MEDROL) injection  60 mg Intravenous Q6H  . mometasone-formoterol  2 puff Inhalation BID  . pantoprazole  40 mg Oral Daily  . sodium chloride flush  3 mL Intravenous Q12H   Continuous Infusions: . sodium chloride    . azithromycin 500 mg (10/09/18 0529)     LOS: 1 day   Phillips Climes MD [facility]   *Please refer to [email or web address], password [email or web address] to get updated schedule  on who will round on this patient, as hospitalists switch teams weekly. If 7PM-7AM, please contact night-coverage at [email or web address], password [email or web address] for any overnight needs.  10/09/2018, 3:34 PM

## 2018-10-10 LAB — BASIC METABOLIC PANEL
Anion gap: 12 (ref 5–15)
BUN: 17 mg/dL (ref 8–23)
CO2: 28 mmol/L (ref 22–32)
Calcium: 8.9 mg/dL (ref 8.9–10.3)
Chloride: 99 mmol/L (ref 98–111)
Creatinine, Ser: 1.08 mg/dL (ref 0.61–1.24)
GFR calc Af Amer: >60 mL/min (ref >60)
GFR calc non Af Amer: >60 mL/min (ref >60)
Glucose, Bld: 132 mg/dL — ABNORMAL HIGH (ref 70–99)
Potassium: 3.8 mmol/L (ref 3.5–5.1)
Sodium: 139 mmol/L (ref 135–145)

## 2018-10-10 LAB — CBC
HCT: 35.2 % — ABNORMAL LOW (ref 39.0–52.0)
Hemoglobin: 11 g/dL — ABNORMAL LOW (ref 13.0–17.0)
MCH: 31.2 pg (ref 26.0–34.0)
MCHC: 31.3 g/dL (ref 30.0–36.0)
MCV: 99.7 fL (ref 80.0–100.0)
PLATELETS: 143 10*3/uL — AB (ref 150–400)
RBC: 3.53 MIL/uL — ABNORMAL LOW (ref 4.22–5.81)
RDW: 13.5 % (ref 11.5–15.5)
WBC: 7.9 10*3/uL (ref 4.0–10.5)
nRBC: 0 % (ref 0.0–0.2)

## 2018-10-10 MED ORDER — IPRATROPIUM-ALBUTEROL 0.5-2.5 (3) MG/3ML IN SOLN
3.0000 mL | Freq: Three times a day (TID) | RESPIRATORY_TRACT | Status: DC
  Administered 2018-10-11 – 2018-10-12 (×5): 3 mL via RESPIRATORY_TRACT
  Filled 2018-10-10 (×5): qty 3

## 2018-10-10 MED ORDER — FUROSEMIDE 10 MG/ML IJ SOLN: 20.0000 mg | Freq: Every day | INTRAMUSCULAR | Status: DC

## 2018-10-10 MED ORDER — POTASSIUM CHLORIDE CRYS ER 20 MEQ PO TBCR
40.0000 meq | EXTENDED_RELEASE_TABLET | Freq: Once | ORAL | Status: AC
  Administered 2018-10-10: 40 meq via ORAL
  Filled 2018-10-10: qty 2

## 2018-10-10 MED ORDER — METHYLPREDNISOLONE SODIUM SUCC 125 MG IJ SOLR
60.0000 mg | Freq: Three times a day (TID) | INTRAMUSCULAR | Status: DC
  Administered 2018-10-10 – 2018-10-11 (×2): 60 mg via INTRAVENOUS
  Filled 2018-10-10 (×2): qty 2

## 2018-10-10 MED ORDER — AZITHROMYCIN 250 MG PO TABS
500.0000 mg | ORAL_TABLET | Freq: Every day | ORAL | Status: DC
  Administered 2018-10-11 – 2018-10-12 (×2): 500 mg via ORAL
  Filled 2018-10-10 (×2): qty 2

## 2018-10-10 MED ORDER — FUROSEMIDE 10 MG/ML IJ SOLN
40.0000 mg | Freq: Every day | INTRAMUSCULAR | Status: DC
  Administered 2018-10-10: 40 mg via INTRAVENOUS
  Filled 2018-10-10: qty 4

## 2018-10-10 NOTE — Progress Notes (Signed)
Progress Note    Chase Lloyd  LKT:[id number] DOB: [birth year]  DOA: 10/06/2018 PCP: [name], NP    Brief Narrative:    62 y.o. male with medical history significant for emphysema, tobacco abuse in remission, and degenerative disc disease, now presenting to the emergency department for evaluation of progressive shortness of breath and cough despite completing 2 courses of antibiotics and prednisone over the past 1 month.    Work-up significant for pleural effusion, small, and COPD exacerbation. Subjective:   Reported dyspnea is improving, to ambulate to the bathroom back-and-forth with minimal dyspnea, reports cough, nonproductive  Assessment/Plan:   Principal Problem:   COPD with acute exacerbation (HCC) Active Problems:   Acute respiratory distress   Hypertensive urgency   Troponin I above reference range   Dyspnea   Acute Hypoxic respiratory failure -Still with increased work of breathing, diffuse wheezing, requiring oxygen, likely will need to go home with home oxygen  COPD with acute exacerbation - severe emphysema - Reports progressive SOB and cough despite completing 2 courses of antibiotics and prednisone  - Found to be tachycardic with slight elevation in troponin and CTA was performed, negative for PE or other acute findings  - BNP is normal, there is no peripheral edema, JVD, or rales  - echo: normal EF, grade 1 diastolic, normal wall motion -Continue with IV steroids, significantly dyspneic, has some improvement today, I will decrease his IV Solu-Medrol 60 mg every 8 hours, 10 you with scheduled nebs, continue with Dulera.  Small pleural effusion -s/p drainage in IR, total of 350 cc were removed, Significant for exudative effusion as LDH ratio is 0.68, but so far Gram stain is negative, cultures remains negative -Follow cytology -Encouraged to use incentive spirometry  Acute on chronic diastolic CHF -2D echo with a preserved EF, but grade 1 diastolic  dysfunction, has lower extremity edema, improving with diuresis   Hypertensive urgency  -Blood pressure elevated on admission, most likely in the setting of respiratory distress, currently blood pressure controlled without any medications  Troponin elevation  - CE negative   Family Communication/Anticipated D/C date and plan/Code Status   DVT prophylaxis: Lovenox ordered. Code Status: Full Code.  Family Communication: Daughter at bedside Disposition Plan: pending improvement   Medical Consultants:    IR   pulm       Objective:    Vitals:   10/10/18 0119 10/10/18 0800 10/10/18 0843 10/10/18 1403  BP:  (!) 152/73    Pulse:  98    Resp:      Temp:  98.2 F (36.8 C)    TempSrc:  Oral    SpO2: 96% 100% 93% 96%  Weight:      Height:       No intake or output data in the 24 hours ending 10/10/18 1442 Filed Weights   10/06/18 1806  Weight: 81.9 kg    Exam:  Awake Alert, Oriented X 3, No new F.N deficits, Normal affect Symmetrical Chest wall movement, diminished air entry bilaterally, patient had significant wheezing, but it did improve from yesterday, not work as hard as yesterday, RRR,No Gallops,Rubs or new Murmurs, No Parasternal Heave +ve B.Sounds, Abd Soft, No tenderness, No rebound - guarding or rigidity. No Cyanosis, Clubbing, +1 edema, No new Rash or bruise       Data Reviewed:   I have personally reviewed following labs and imaging studies:  Labs: Labs show the following:   Basic Metabolic Panel: Recent Labs  Lab 10/06/18  5364 10/07/18 0515 10/08/18 0240 10/09/18 0301 10/10/18 0227  NA 139 139 140 139 139  K 3.8 3.5 4.0 4.3 3.8  CL 104 102 101 102 99  CO2 27 25 26 26 28   GLUCOSE 99 90 160* 136* 132*  BUN 12 12 14 18 17   CREATININE 1.23 0.93 1.17 1.11 1.08  CALCIUM 9.2 9.0 9.1 9.1 8.9   GFR Estimated Creatinine Clearance: 71.3 mL/min (by C-G formula based on SCr of 1.08 mg/dL). Liver Function Tests: Recent Labs  Lab  10/06/18 1833  AST 31  ALT 26  ALKPHOS 84  BILITOT 0.7  PROT 6.6  ALBUMIN 3.4*   No results for input(s): LIPASE, AMYLASE in the last 168 hours. No results for input(s): AMMONIA in the last 168 hours. Coagulation profile Recent Labs  Lab 10/06/18 1833  INR 0.96    CBC: Recent Labs  Lab 10/06/18 1833 10/08/18 0240 10/09/18 0301 10/10/18 0227  WBC 5.8 5.5 9.7 7.9  NEUTROABS 4.2  --   --   --   HGB 11.8* 11.4* 11.2* 11.0*  HCT 38.4* 35.8* 35.7* 35.2*  MCV 99.0 97.5 99.7 99.7  PLT 154 139* 146* 143*   Cardiac Enzymes: Recent Labs  Lab 10/06/18 2114 10/07/18 0515 10/07/18 1004 10/07/18 1549  TROPONINI 0.03* <0.03 <0.03 <0.03   BNP (last 3 results) No results for input(s): PROBNP in the last 8760 hours. CBG: No results for input(s): GLUCAP in the last 168 hours. D-Dimer: No results for input(s): DDIMER in the last 72 hours. Hgb A1c: No results for input(s): HGBA1C in the last 72 hours. Lipid Profile: No results for input(s): CHOL, HDL, LDLCALC, TRIG, CHOLHDL, LDLDIRECT in the last 72 hours. Thyroid function studies: No results for input(s): TSH, T4TOTAL, T3FREE, THYROIDAB in the last 72 hours.  Invalid input(s): FREET3 Anemia work up: No results for input(s): VITAMINB12, FOLATE, FERRITIN, TIBC, IRON, RETICCTPCT in the last 72 hours. Sepsis Labs: Recent Labs  Lab 10/06/18 1833 10/06/18 1839 10/06/18 2155 10/08/18 0240 10/09/18 0301 10/10/18 0227  WBC 5.8  --   --  5.5 9.7 7.9  LATICACIDVEN  --  0.94 1.34  --   --   --     Microbiology Recent Results (from the past 240 hour(s))  Culture, blood (Routine x 2)     Status: None (Preliminary result)   Collection Time: 10/06/18  6:33 PM  Result Value Ref Range Status   Specimen Description BLOOD BLOOD RIGHT WRIST  Final   Special Requests   Final    BOTTLES DRAWN AEROBIC AND ANAEROBIC Blood Culture adequate volume   Culture   Final    NO GROWTH 3 DAYS Performed at [facility] [address], [place], [place] [address]    Report Status PENDING  Incomplete  Culture, blood (Routine x 2)     Status: None (Preliminary result)   Collection Time: 10/06/18  8:32 PM  Result Value Ref Range Status   Specimen Description BLOOD RIGHT FOREARM  Final   Special Requests   Final    BOTTLES DRAWN AEROBIC AND ANAEROBIC Blood Culture results may not be optimal due to an inadequate volume of blood received in culture bottles   Culture   Final    NO GROWTH 3 DAYS Performed at [facility], [place] [address], [place], [place] [address]    Report Status PENDING  Incomplete  Gram stain     Status: None   Collection Time: 10/08/18 11:45 AM  Result  Value Ref Range Status   Specimen Description PLEURAL  Final   Special Requests NONE  Final   Gram Stain   Final    ABUNDANT WBC PRESENT, PREDOMINANTLY MONONUCLEAR NO ORGANISMS SEEN Performed at [facility], [address] [place], [place] [address]    Report Status 10/08/2018 FINAL  Final  Culture, body fluid-bottle     Status: None (Preliminary result)   Collection Time: 10/08/18 12:12 PM  Result Value Ref Range Status   Specimen Description PLEURAL  Final   Special Requests NONE  Final   Culture   Final    NO GROWTH 1 DAY Performed at [facility], [place] [address], [place], [place] [address]    Report Status PENDING  Incomplete    Procedures and diagnostic studies:  No results found.  Medications:   . [START ON 10/11/2018] azithromycin  500 mg Oral Q0600  . enoxaparin (LOVENOX) injection  40 mg Subcutaneous Q24H  . furosemide  20 mg Intravenous Daily  . Influenza vac split quadrivalent PF  0.5 mL Intramuscular Tomorrow-1000  . ipratropium-albuterol  3 mL Nebulization Q6H  . methylPREDNISolone (SOLU-MEDROL) injection  60 mg Intravenous Q8H  . mometasone-formoterol  2 puff Inhalation BID  . pantoprazole  40 mg Oral Daily  . sodium chloride flush  3 mL Intravenous Q12H   Continuous Infusions: . sodium chloride        LOS: 2 days   Phillips Climes MD [facility]   *Please refer to [email or web address], password [email or web address] to get updated schedule on who will round on this patient, as hospitalists switch teams weekly. If 7PM-7AM, please contact night-coverage at [email or web address], password [email or web address] for any overnight needs.  10/10/2018, 2:42 PM

## 2018-10-11 LAB — BASIC METABOLIC PANEL
Anion gap: 8 (ref 5–15)
BUN: 20 mg/dL (ref 8–23)
CO2: 34 mmol/L — ABNORMAL HIGH (ref 22–32)
Calcium: 8.7 mg/dL — ABNORMAL LOW (ref 8.9–10.3)
Chloride: 98 mmol/L (ref 98–111)
Creatinine, Ser: 1.31 mg/dL — ABNORMAL HIGH (ref 0.61–1.24)
GFR calc Af Amer: >60 mL/min (ref >60)
GFR, EST NON AFRICAN AMERICAN: 57 mL/min — AB (ref >60)
Glucose, Bld: 133 mg/dL — ABNORMAL HIGH (ref 70–99)
Potassium: 4.2 mmol/L (ref 3.5–5.1)
Sodium: 140 mmol/L (ref 135–145)

## 2018-10-11 LAB — CULTURE, BLOOD (ROUTINE X 2)
Culture: NO GROWTH
Culture: NO GROWTH
SPECIAL REQUESTS: ADEQUATE

## 2018-10-11 LAB — PH, BODY FLUID: pH, Body Fluid: 7.7

## 2018-10-11 MED ORDER — METHYLPREDNISOLONE SODIUM SUCC 40 MG IJ SOLR
40.0000 mg | Freq: Two times a day (BID) | INTRAMUSCULAR | Status: DC
  Administered 2018-10-11 – 2018-10-12 (×2): 40 mg via INTRAVENOUS
  Filled 2018-10-11 (×2): qty 1

## 2018-10-11 NOTE — Progress Notes (Signed)
SATURATION QUALIFICATIONS: (This note is used to comply with regulatory documentation for home oxygen)  Patient Saturations on Room Air at Rest = 93%  Patient Saturations on Room Air while Ambulating = 87%  Patient Saturations on 2 Liters of oxygen while Ambulating = 94%  Please briefly explain why patient needs home oxygen: Pt oxygen levels dropped after walking without oxygen.

## 2018-10-11 NOTE — Care Management Important Message (Signed)
Important Message  Patient Details  Name: Chase Lloyd MRN: [id number] Date of Birth: 1956-04-20   Medicare Important Message Given:  Yes    [name] [address] 10/11/2018, 4:19 PM

## 2018-10-11 NOTE — Progress Notes (Signed)
Progress Note    Chase Lloyd  AQT:[id number] DOB: February 06, 1956  DOA: 10/06/2018 PCP: [name], NP    Brief Narrative:    62 y.o. male with medical history significant for emphysema, tobacco abuse in remission, and degenerative disc disease, now presenting to the emergency department for evaluation of progressive shortness of breath and cough despite completing 2 courses of antibiotics and prednisone over the past 1 month.    Work-up significant for pleural effusion, small, and COPD exacerbation. Subjective:   Dyspnea has improved, denies any cough or productive sputum today .  Assessment/Plan:   Principal Problem:   COPD with acute exacerbation (HCC) Active Problems:   Acute respiratory distress   Hypertensive urgency   Troponin I above reference range   Dyspnea   Acute Hypoxic respiratory failure -Still with increased work of breathing, diffuse wheezing, requiring oxygen, will ambulate and monitoring for hypoxia to see if he qualifies for home O2.  COPD with acute exacerbation - severe emphysema - Reports progressive SOB and cough despite completing 2 courses of antibiotics and prednisone  - Found to be tachycardic with slight elevation in troponin and CTA was performed, negative for PE or other acute findings  - BNP is normal, there is no peripheral edema, JVD, or rales  - echo: normal EF, grade 1 diastolic, normal wall motion -Dyspnea and wheezing has improved, will taper IV steroids further today, continue with Dulera and scheduled nebs .  Small pleural effusion -s/p drainage in IR, total of 350 cc were removed, Significant for exudative effusion as LDH ratio is 0.68, but so far Gram stain is negative, cultures remains negative -Follow cytology, still pending -Encouraged to use incentive spirometry  Acute on chronic diastolic CHF -2D echo with a preserved EF, but grade 1 diastolic dysfunction, has lower extremity edema, improving with diuresis, will hold Lasix  today given increase in creatinine to 1.3  AKI -Creatinine peaked at 1.3 today, will hold Lasix today   Hypertensive urgency  -Blood pressure elevated on admission, most likely in the setting of respiratory distress, currently blood pressure controlled without any medications  Troponin elevation  - CE negative   Family Communication/Anticipated D/C date and plan/Code Status   DVT prophylaxis: Lovenox ordered. Code Status: Full Code.  Family Communication: none at bedside Disposition Plan: pending improvement   Medical Consultants:    IR   pulm   Objective:    Vitals:   10/10/18 2105 10/10/18 2157 10/11/18 0749 10/11/18 0851  BP: 131/62 136/72 (!) 157/76   Pulse:  (!) 101 87   Resp:  20 20   Temp:  98.3 F (36.8 C) 98.6 F (37 C)   TempSrc:  Oral    SpO2: 99% 98% 96% 96%  Weight:      Height:        Intake/Output Summary (Last 24 hours) at 10/11/2018 1107 Last data filed at 10/11/2018 0800 Gross per 24 hour  Intake 600 ml  Output -  Net 600 ml   Filed Weights   10/06/18 1806  Weight: 81.9 kg    Exam:  Awake Alert, Oriented X 3, No new F.N deficits, Normal affect Symmetrical Chest wall movement, improved air entry today, and has subsided as well RRR,No Gallops,Rubs or new Murmurs, No Parasternal Heave +ve B.Sounds, Abd Soft, No tenderness, No rebound - guarding or rigidity. No Cyanosis, Clubbing , +1 edema, No new Rash or bruise        Data Reviewed:   I have  personally reviewed following labs and imaging studies:  Labs: Labs show the following:   Basic Metabolic Panel: Recent Labs  Lab 10/07/18 0515 10/08/18 0240 10/09/18 0301 10/10/18 0227 10/11/18 0240  NA 139 140 139 139 140  K 3.5 4.0 4.3 3.8 4.2  CL 102 101 102 99 98  CO2 25 26 26 28  34*  GLUCOSE 90 160* 136* 132* 133*  BUN 12 14 18 17 20   CREATININE 0.93 1.17 1.11 1.08 1.31*  CALCIUM 9.0 9.1 9.1 8.9 8.7*   GFR Estimated Creatinine Clearance: 58.8 mL/min (A) (by C-G  formula based on SCr of 1.31 mg/dL (H)). Liver Function Tests: Recent Labs  Lab 10/06/18 1833  AST 31  ALT 26  ALKPHOS 84  BILITOT 0.7  PROT 6.6  ALBUMIN 3.4*   No results for input(s): LIPASE, AMYLASE in the last 168 hours. No results for input(s): AMMONIA in the last 168 hours. Coagulation profile Recent Labs  Lab 10/06/18 1833  INR 0.96    CBC: Recent Labs  Lab 10/06/18 1833 10/08/18 0240 10/09/18 0301 10/10/18 0227  WBC 5.8 5.5 9.7 7.9  NEUTROABS 4.2  --   --   --   HGB 11.8* 11.4* 11.2* 11.0*  HCT 38.4* 35.8* 35.7* 35.2*  MCV 99.0 97.5 99.7 99.7  PLT 154 139* 146* 143*   Cardiac Enzymes: Recent Labs  Lab 10/06/18 2114 10/07/18 0515 10/07/18 1004 10/07/18 1549  TROPONINI 0.03* <0.03 <0.03 <0.03   BNP (last 3 results) No results for input(s): PROBNP in the last 8760 hours. CBG: No results for input(s): GLUCAP in the last 168 hours. D-Dimer: No results for input(s): DDIMER in the last 72 hours. Hgb A1c: No results for input(s): HGBA1C in the last 72 hours. Lipid Profile: No results for input(s): CHOL, HDL, LDLCALC, TRIG, CHOLHDL, LDLDIRECT in the last 72 hours. Thyroid function studies: No results for input(s): TSH, T4TOTAL, T3FREE, THYROIDAB in the last 72 hours.  Invalid input(s): FREET3 Anemia work up: No results for input(s): VITAMINB12, FOLATE, FERRITIN, TIBC, IRON, RETICCTPCT in the last 72 hours. Sepsis Labs: Recent Labs  Lab 10/06/18 1833 10/06/18 1839 10/06/18 2155 10/08/18 0240 10/09/18 0301 10/10/18 0227  WBC 5.8  --   --  5.5 9.7 7.9  LATICACIDVEN  --  0.94 1.34  --   --   --     Microbiology Recent Results (from the past 240 hour(s))  Culture, blood (Routine x 2)     Status: None (Preliminary result)   Collection Time: 10/06/18  6:33 PM  Result Value Ref Range Status   Specimen Description BLOOD BLOOD RIGHT WRIST  Final   Special Requests   Final    BOTTLES DRAWN AEROBIC AND ANAEROBIC Blood Culture adequate volume    Culture   Final    NO GROWTH 4 DAYS Performed at [facility] [address], [place], [place] [address]    Report Status PENDING  Incomplete  Culture, blood (Routine x 2)     Status: None (Preliminary result)   Collection Time: 10/06/18  8:32 PM  Result Value Ref Range Status   Specimen Description BLOOD RIGHT FOREARM  Final   Special Requests   Final    BOTTLES DRAWN AEROBIC AND ANAEROBIC Blood Culture results may not be optimal due to an inadequate volume of blood received in culture bottles   Culture   Final    NO GROWTH 4 DAYS Performed at [facility], [place] [address], [place], [place] [address]  Report Status PENDING  Incomplete  Gram stain     Status: None   Collection Time: 10/08/18 11:45 AM  Result Value Ref Range Status   Specimen Description PLEURAL  Final   Special Requests NONE  Final   Gram Stain   Final    ABUNDANT WBC PRESENT, PREDOMINANTLY MONONUCLEAR NO ORGANISMS SEEN Performed at [facility], [address], [place], [place] [address]    Report Status 10/08/2018 FINAL  Final  Culture, body fluid-bottle     Status: None (Preliminary result)   Collection Time: 10/08/18 12:12 PM  Result Value Ref Range Status   Specimen Description PLEURAL  Final   Special Requests NONE  Final   Culture   Final    NO GROWTH 2 DAYS Performed at [facility], [place] [address], [place],  [address]    Report Status PENDING  Incomplete    Procedures and diagnostic studies:  No results found.  Medications:   . azithromycin  500 mg Oral Q0600  . enoxaparin (LOVENOX) injection  40 mg Subcutaneous Q24H  . ipratropium-albuterol  3 mL Nebulization TID  . methylPREDNISolone (SOLU-MEDROL) injection  40 mg Intravenous Q12H  . mometasone-formoterol  2 puff Inhalation BID  . pantoprazole  40 mg Oral Daily  . sodium chloride flush  3 mL Intravenous Q12H   Continuous Infusions: . sodium chloride       LOS: 3 days   Phillips Climes MD [facility]   *Please refer to [email or web address], password [email or web address] to get updated schedule on who will round on this patient, as hospitalists switch teams weekly. If 7PM-7AM, please contact night-coverage at [email or web address], password [email or web address] for any overnight needs.  10/11/2018, 11:07 AM

## 2018-10-12 LAB — BASIC METABOLIC PANEL
Anion gap: 10 (ref 5–15)
BUN: 21 mg/dL (ref 8–23)
CO2: 28 mmol/L (ref 22–32)
CREATININE: 1.19 mg/dL (ref 0.61–1.24)
Calcium: 8.7 mg/dL — ABNORMAL LOW (ref 8.9–10.3)
Chloride: 100 mmol/L (ref 98–111)
GFR calc Af Amer: >60 mL/min (ref >60)
GFR calc non Af Amer: >60 mL/min (ref >60)
Glucose, Bld: 121 mg/dL — ABNORMAL HIGH (ref 70–99)
Potassium: 4 mmol/L (ref 3.5–5.1)
Sodium: 138 mmol/L (ref 135–145)

## 2018-10-12 MED ORDER — FUROSEMIDE 20 MG PO TABS: 20.0000 mg | ORAL_TABLET | Freq: Every day | ORAL | 11 refills | Status: DC

## 2018-10-12 MED ORDER — AZITHROMYCIN 250 MG PO TABS: 250.0000 mg | ORAL_TABLET | Freq: Every day | ORAL | 0 refills | Status: DC

## 2018-10-12 MED ORDER — ACETAMINOPHEN 325 MG PO TABS: 650.0000 mg | ORAL_TABLET | Freq: Four times a day (QID) | ORAL | Status: DC | PRN

## 2018-10-12 MED ORDER — PREDNISONE 10 MG (21) PO TBPK: ORAL_TABLET | ORAL | 0 refills | Status: DC

## 2018-10-12 MED FILL — predniSONE 10 MG (21) TBPK: 10 | 21 days supply | Qty: 21 | Fill #0

## 2018-10-12 MED FILL — AZITHROMYCIN 250 MG TABLET: 250 | 4 days supply | Qty: 4 | Fill #0

## 2018-10-12 MED FILL — FUROSEMIDE 20 MG TAB: 20 | 30 days supply | Qty: 30 | Fill #0 | Status: TO

## 2018-10-12 NOTE — Discharge Instructions (Signed)
Follow with Primary MD [name], NP in 7 days   Get CBC, CMP, 2 view Chest X ray checked  by Primary MD next visit.    Activity: As tolerated with Full fall precautions use walker/cane & assistance as needed   Disposition Home    Diet: Heart Healthy  , with feeding assistance and aspiration precautions.  For Heart failure patients - Check your Weight same time everyday, if you gain over 2 pounds, or you develop in leg swelling, experience more shortness of breath or chest pain, call your Primary MD immediately. Follow Cardiac Low Salt Diet and 1.5 lit/day fluid restriction.   On your next visit with your primary care physician please Get Medicines reviewed and adjusted.   Please request your Prim.MD to go over all Hospital Tests and Procedure/Radiological results at the follow up, please get all Hospital records sent to your Prim MD by signing hospital release before you go home.   If you experience worsening of your admission symptoms, develop shortness of breath, life threatening emergency, suicidal or homicidal thoughts you must seek medical attention immediately by calling 911 or calling your MD immediately  if symptoms less severe.  You Must read complete instructions/literature along with all the possible adverse reactions/side effects for all the Medicines you take and that have been prescribed to you. Take any new Medicines after you have completely understood and accpet all the possible adverse reactions/side effects.   Do not drive, operating heavy machinery, perform activities at heights, swimming or participation in water activities or provide baby sitting services if your were admitted for syncope or siezures until you have seen by Primary MD or a Neurologist and advised to do so again.  Do not drive when taking Pain medications.    Do not take more than prescribed Pain, Sleep and Anxiety Medications  Special Instructions: If you have smoked or chewed Tobacco  in  the last 2 yrs please stop smoking, stop any regular Alcohol  and or any Recreational drug use.  Wear Seat belts while driving.   Please note  You were cared for by a hospitalist during your hospital stay. If you have any questions about your discharge medications or the care you received while you were in the hospital after you are discharged, you can call the unit and asked to speak with the hospitalist on call if the hospitalist that took care of you is not available. Once you are discharged, your primary care physician will handle any further medical issues. Please note that NO REFILLS for any discharge medications will be authorized once you are discharged, as it is imperative that you return to your primary care physician (or establish a relationship with a primary care physician if you do not have one) for your aftercare needs so that they can reassess your need for medications and monitor your lab values.

## 2018-10-12 NOTE — Discharge Summary (Signed)
Chase Lloyd, is a 62 y.o. male  DOB May 01, 1956  MRN [id number].  Admission date:  10/06/2018  Admitting Physician  Vianne Bulls, MD  Discharge Date:  10/12/2018   Primary MD  [name], NP  Recommendations for primary care physician for things to follow:  -Please check CBC, BMP to ensure stable renal function after initiation of diuresis  Admission Diagnosis  Hypertensive emergency [I16.1] Dyspnea, unspecified type [R06.00]   Discharge Diagnosis  Hypertensive emergency [I16.1] Dyspnea, unspecified type [R06.00]    Principal Problem:   COPD with acute exacerbation ([place]) Active Problems:   Acute respiratory distress   Hypertensive urgency   Troponin I above reference range   Dyspnea      Past Medical History:  Diagnosis Date  . Back pain   . COPD (chronic obstructive pulmonary disease) ([place])   . DDD (degenerative disc disease), lumbar   . Depression   . Emphysema of lung ([place])   . Empyema ([place])    2016  (was in [place])  . GERD (gastroesophageal reflux disease)   . Hearing loss of right ear    started 2001.     microphone in right ear ,  and hearing aid in left  . Hodgkin's disease ([place]) 1999  . Hypotension   . Meniere's disease   . Pneumonia   . Tinnitus     Past Surgical History:  Procedure Laterality Date  . BACK SURGERY    . CERVICAL FUSION  2016  . ELBOW SURGERY    . EYE SURGERY    . IR THORACENTESIS ASP PLEURAL SPACE W/IMG GUIDE  10/08/2018  . LAMINECTOMY  1999  . lower back surgery    . TONSILLECTOMY         History of present illness and  Hospital Course:     Kindly see H&P for history of present illness and admission details, please review complete Labs, Consult reports and Test reports for all details in brief  HPI  from the history and physical done on the day of admission 10/07/2018  HPI: Chase Lloyd is a 62 y.o. male with medical history  significant for emphysema, tobacco abuse in remission, and degenerative disc disease, now presenting to the emergency department for evaluation of progressive shortness of breath and cough despite completing 2 courses of antibiotics and prednisone over the past 1 month.  Patient reports that his chronic dyspnea and cough had worsened a little over a month ago, he was seen by his PCP at that time, started on doxycycline and prednisone, failed to have any significant improvement, and then followed up with his pulmonologist about 2 weeks ago.  Pulmonologist started Levaquin and prednisone, but he continued to have increased shortness of breath and cough.  There has not been any chest pain, fevers, chills, or leg swelling or tenderness associated with this.  He underwent a lung cancer screening CT on 10/04/2018 with possible pneumonia versus atelectasis versus neoplasm, as well as left pleural effusion and suspected interstitial edema.  He was contacted by his pulmonology clinic and directed to the ED for evaluation of this.  ED Course: Upon arrival to the ED, patient is found to be afebrile, saturating mid 90s on room air, slightly tachypneic, mildly tachycardic, and with blood pressure 205/87.  EKG features a sinus tachycardia with rate 107, right axis deviation, and nonspecific ST-T abnormality that appears similar to prior.  CTA chest was performed and is negative for PE, but notable for small bilateral pleural effusions and advanced emphysema.  Blood cultures were collected in the ED and the patient was treated with labetalol with improvement in his blood pressure.  Hospitalist were asked to admit for further evaluation and management.  Hospital Course    62 y.o. male with medical history significant foremphysema, tobacco abuse in remission, and degenerative disc disease, now presenting to the emergency department for evaluation of progressive shortness of breath and cough despite completing 2 courses of  antibiotics and prednisone over the past 1 month.   Work-up significant for pleural effusion, small, and COPD exacerbation.  Acute Hypoxic respiratory failure -Secondary to COPD exacerbation, and small pleural effusion, he qualified for oxygen as dropped to 87% on ambulation   COPD with acute exacerbation- severe emphysema -Reports progressive SOB and cough despite completing 2 courses of antibiotics and prednisone -Found to be tachycardic with slight elevation in troponin and CTA was performed, negative for PE or other acute findings - echo: normal EF, grade 1 diastolic, normal wall motion -Was on IV steroids during hospital stay, wheezing has significantly improved, he will be discharged on prednisone taper, as well to finish another 4 days of oral azithromycin given his productive cough .  Small pleural effusion -s/p drainage in IR, total of 350 cc were removed, Significant for exudative effusion as LDH ratio is 0.68, but so far Gram stain is negative, cultures remains negative -Encouraged to use incentive spirometry -Collagen with no evidence of malignant cells  Acute on chronic diastolic CHF -2D echo with a preserved EF, but grade 1 diastolic dysfunction, has lower extremity edema, improving with diuresis, w will be discharged on low dose Lasix  AKI -Creatinine peaked at 1.3 today, secondary to diuresis, improved with holding diuretics, he will be discharged on low-dose LasixHypertensive urgency -Blood pressure elevated on admission, most likely in the setting of respiratory distress, currently blood pressure controlled without any medications  Troponin elevation -CE negative    Discharge Condition:  Stable   Follow UP  Follow-up Information    Nafziger, Tommi Rumps, NP Follow up in 1 week(s).   Specialty:  Family Medicine Contact information: [address] [place] [address] [phone]             Discharge Instructions  and  Discharge  Medications    Discharge Instructions    Discharge instructions   Complete by:  As directed    Follow with Primary MD [name], NP in 7 days   Get CBC, CMP, 2 view Chest X ray checked  by Primary MD next visit.    Activity: As tolerated with Full fall precautions use walker/cane & assistance as needed   Disposition Home    Diet: Heart Healthy  , with feeding assistance and aspiration precautions.  For Heart failure patients - Check your Weight same time everyday, if you gain over 2 pounds, or you develop in leg swelling, experience more shortness of breath or chest pain, call your Primary MD immediately. Follow Cardiac Low Salt Diet and 1.5 lit/day fluid restriction.  On your next visit with your primary care physician please Get Medicines reviewed and adjusted.   Please request your Prim.MD to go over all Hospital Tests and Procedure/Radiological results at the follow up, please get all Hospital records sent to your Prim MD by signing hospital release before you go home.   If you experience worsening of your admission symptoms, develop shortness of breath, life threatening emergency, suicidal or homicidal thoughts you must seek medical attention immediately by calling 911 or calling your MD immediately  if symptoms less severe.  You Must read complete instructions/literature along with all the possible adverse reactions/side effects for all the Medicines you take and that have been prescribed to you. Take any new Medicines after you have completely understood and accpet all the possible adverse reactions/side effects.   Do not drive, operating heavy machinery, perform activities at heights, swimming or participation in water activities or provide baby sitting services if your were admitted for syncope or siezures until you have seen by Primary MD or a Neurologist and advised to do so again.  Do not drive when taking Pain medications.    Do not take more than prescribed  Pain, Sleep and Anxiety Medications  Special Instructions: If you have smoked or chewed Tobacco  in the last 2 yrs please stop smoking, stop any regular Alcohol  and or any Recreational drug use.  Wear Seat belts while driving.   Please note  You were cared for by a hospitalist during your hospital stay. If you have any questions about your discharge medications or the care you received while you were in the hospital after you are discharged, you can call the unit and asked to speak with the hospitalist on call if the hospitalist that took care of you is not available. Once you are discharged, your primary care physician will handle any further medical issues. Please note that NO REFILLS for any discharge medications will be authorized once you are discharged, as it is imperative that you return to your primary care physician (or establish a relationship with a primary care physician if you do not have one) for your aftercare needs so that they can reassess your need for medications and monitor your lab values.   Increase activity slowly   Complete by:  As directed      Allergies as of 10/12/2018   No Known Allergies     Medication List    STOP taking these medications   ibuprofen 800 MG tablet Commonly known as:  ADVIL,MOTRIN     TAKE these medications   acetaminophen 325 MG tablet Commonly known as:  TYLENOL Take 2 tablets (650 mg total) by mouth every 6 (six) hours as needed for mild pain (or Fever >/= 101).   albuterol (2.5 MG/3ML) 0.083% nebulizer solution Commonly known as:  PROVENTIL Take 3 mLs (2.5 mg total) by nebulization every 6 (six) hours as needed for wheezing or shortness of breath.   azithromycin 250 MG tablet Commonly known as:  ZITHROMAX Take 1 tablet (250 mg total) by mouth daily.   furosemide 20 MG tablet Commonly known as:  LASIX Take 1 tablet (20 mg total) by mouth daily.   omeprazole 40 MG capsule Commonly known as:  PRILOSEC Take 1 capsule (40 mg  total) by mouth daily.   predniSONE 10 MG (21) Tbpk tablet Commonly known as:  STERAPRED UNI-PAK 21 TAB Use per package instruction   SYMBICORT 160-4.5 MCG/ACT inhaler Generic drug:  budesonide-formoterol Inhale 2 puffs into the  lungs 2 (two) times daily.   Tiotropium Bromide Monohydrate 2.5 MCG/ACT Aers Inhale 2 puffs into the lungs daily.            Durable Medical Equipment  (From admission, onward)         Start     Ordered   10/12/18 1052  DME Oxygen  Once    Question Answer Comment  Mode or (Route) Nasal cannula   Liters per Minute 2   Frequency Continuous (stationary and portable oxygen unit needed)   Oxygen conserving device Yes   Oxygen delivery system Gas      10/12/18 1051            Diet and Activity recommendation: See Discharge Instructions above   Consults obtained -  Pulmonary   Major procedures and Radiology Reports - PLEASE review detailed and final reports for all details, in brief -      Dg Chest 1 View  Result Date: 10/08/2018 CLINICAL DATA:  Status post thoracentesis. EXAM: CHEST  1 VIEW COMPARISON:  Radiograph of October 07, 2018. FINDINGS: The heart size and mediastinal contours are within normal limits. No pneumothorax is noted. Right pleural effusion is significantly smaller status post thoracentesis. Stable scarring is noted in right upper lobe. No acute pulmonary disease is noted. The visualized skeletal structures are unremarkable. IMPRESSION: No pneumothorax status post right-sided thoracentesis. Electronically Signed   By: Marijo Conception, M.D.   On: 10/08/2018 12:19   Dg Chest 2 View  Result Date: 10/06/2018 CLINICAL DATA:  62 year old male with pneumonia. EXAM: CHEST - 2 VIEW COMPARISON:  Chest CT dated 10/04/2018 FINDINGS: There is emphysematous changes of the lungs and diffuse interstitial coarsening and mild chronic bronchitic changes. There is a small right pleural effusion. There is no focal consolidation. Slight  increased density over the right mid to lower lung field similar to prior radiograph likely atelectasis. Developing infiltrate is not excluded. Clinical correlation is recommended. No pneumothorax. The cardiac silhouette is within normal limits. No acute osseous pathology. Cervical fixation hardware. IMPRESSION: 1. Small right pleural effusion and probable right lung base atelectasis. Developing infiltrate is not excluded. 2. Emphysema. Electronically Signed   By: Anner Crete M.D.   On: 10/06/2018 21:17   Ct Angio Chest Pe W And/or Wo Contrast  Result Date: 10/06/2018 CLINICAL DATA:  Shortness of breath EXAM: CT ANGIOGRAPHY CHEST WITH CONTRAST TECHNIQUE: Multidetector CT imaging of the chest was performed using the standard protocol during bolus administration of intravenous contrast. Multiplanar CT image reconstructions and MIPs were obtained to evaluate the vascular anatomy. CONTRAST:  40mL ISOVUE-370 IOPAMIDOL (ISOVUE-370) INJECTION 76% COMPARISON:  None. FINDINGS: Cardiovascular: No filling defects in the pulmonary arteries to suggest pulmonary emboli. Diffuse coronary artery calcifications. Moderate aortic calcifications. No aneurysm. Heart is normal size. Mediastinum/Nodes: No mediastinal, hilar, or axillary adenopathy. Lungs/Pleura: Small bilateral pleural effusions. Advanced emphysema. Postoperative changes in the right upper lobe. Bibasilar and biapical scarring. Upper Abdomen: Imaging into the upper abdomen shows no acute findings. Musculoskeletal: Chest wall soft tissues are unremarkable. No acute bony abnormality Review of the MIP images confirms the above findings. IMPRESSION: Advanced emphysema. Postoperative changes in the right upper lobe. Small bilateral pleural effusions. Coronary artery disease, aortic atherosclerosis. No evidence of pulmonary embolus. Electronically Signed   By: Rolm Baptise M.D.   On: 10/06/2018 22:12   Dg Chest Right Decubitus  Result Date: 10/07/2018 CLINICAL  DATA:  Pleural effusion EXAM: CHEST - RIGHT DECUBITUS COMPARISON:  Chest x-ray and CT  10/06/2018 FINDINGS: Layering small right pleural effusion noted on decubitus view. IMPRESSION: Small layering right pleural effusion. Electronically Signed   By: Rolm Baptise M.D.   On: 10/07/2018 20:25   Ct Chest Lung Ca Screen Low Dose W/o Cm  Result Date: 10/04/2018 CLINICAL DATA:  Lung cancer screening. Former smoker. Forty-six pack-year history. Asymptomatic. EXAM: CT CHEST WITHOUT CONTRAST LOW-DOSE FOR LUNG CANCER SCREENING TECHNIQUE: Multidetector CT imaging of the chest was performed following the standard protocol without IV contrast. COMPARISON:  None. FINDINGS: Cardiovascular: Normal heart size. No pericardial effusion. There is aortic atherosclerosis with multi vessel coronary artery atherosclerotic calcifications including left main disease. Mediastinum/Nodes: Normal appearance of the thyroid gland. The trachea appears patent and is midline. Normal appearance of the esophagus. Calcified right paratracheal calcified mediastinal and hilar lymph nodes the are identified. No mediastinal or axillary adenopathy identified. Lungs/Pleura: Moderate to advanced changes of emphysema identified. There are bilateral pleural effusions identified, right greater than left and there is mild bibasilar interlobular septal thickening suggesting interstitial edema. Extensive biapical pleuroparenchymal scarring with calcifications are identified. Mild diffuse pleural thickening overlying the right lung is identified. Perilymphatic micro nodularity is suspected bilaterally. Within the right lung base there is an area of masslike architectural distortion which has an equivalent diameter of 21.8 mm. Indeterminate. Additionally there are several scattered small pulmonary nodules within the left lung. The largest of which is in the left lower lobe with an equivalent diameter of 6.1 mm. Upper Abdomen: No acute abnormality. Musculoskeletal:  No chest wall mass or suspicious bone lesions identified. IMPRESSION: 1. Lung-RADS 4Bs, suspicious. Additional imaging evaluation or consultation with Pulmonology or Thoracic Surgery recommended. 2. The S modifier above refers to masslike area of architectural distortion within the right lung base with underlying moderate pleural effusion. This is indeterminate finding and may represent an area of pneumonia, rounded atelectasis or neoplasm. Additionally, the patient appears to be in heart failure. There is also a left pleural effusion and mild interstitial edema. Prior to proceeding with either PET-CT or biopsy of this area consider addressing any acute clinical condition such as CHF and/or pneumonia followed by repeat imaging in 3 months after resolution of any pulmonary symptoms the patient may be experiencing. 3. Aortic Atherosclerosis (ICD10-I70.0) and Emphysema (ICD10-J43.9). 4. Multi vessel coronary artery atherosclerotic calcifications. 5. Prior granulomatous disease. Electronically Signed   By: Kerby Moors M.D.   On: 10/04/2018 17:35   Ir Thoracentesis Asp Pleural Space W/img Guide  Result Date: 10/08/2018 Lynnea Ferrier     10/08/2018 11:35 AM PROCEDURE SUMMARY: Successful image-guided right thoracentesis. Yielded 350 milliliters of golden yellow fluid. Patient tolerated procedure well. No immediate complications. Specimen was sent for labs. CXR ordered. Please see imaging section of Epic for full dictation. Joaquim Nam PA-C 10/08/2018 11:35 AM    Micro Results    Recent Results (from the past 240 hour(s))  Culture, blood (Routine x 2)     Status: None   Collection Time: 10/06/18  6:33 PM  Result Value Ref Range Status   Specimen Description BLOOD BLOOD RIGHT WRIST  Final   Special Requests   Final    BOTTLES DRAWN AEROBIC AND ANAEROBIC Blood Culture adequate volume   Culture   Final    NO GROWTH 5 DAYS Performed at [facility], [address]  [place], [place] [address]    Report Status 10/11/2018 FINAL  Final  Culture, blood (Routine x 2)     Status: None   Collection  Time: 10/06/18  8:32 PM  Result Value Ref Range Status   Specimen Description BLOOD RIGHT FOREARM  Final   Special Requests   Final    BOTTLES DRAWN AEROBIC AND ANAEROBIC Blood Culture results may not be optimal due to an inadequate volume of blood received in culture bottles   Culture   Final    NO GROWTH 5 DAYS Performed at [facility], [place] [address], [place], [place] [address]    Report Status 10/11/2018 FINAL  Final  Gram stain     Status: None   Collection Time: 10/08/18 11:45 AM  Result Value Ref Range Status   Specimen Description PLEURAL  Final   Special Requests NONE  Final   Gram Stain   Final    ABUNDANT WBC PRESENT, PREDOMINANTLY MONONUCLEAR NO ORGANISMS SEEN Performed at [facility], [place] [address] [place], [place] [address]    Report Status 10/08/2018 FINAL  Final  Culture, body fluid-bottle     Status: None (Preliminary result)   Collection Time: 10/08/18 12:12 PM  Result Value Ref Range Status   Specimen Description PLEURAL  Final   Special Requests NONE  Final   Culture   Final    NO GROWTH 3 DAYS Performed at [facility] [address] [place], [place] [address]    Report Status PENDING  Incomplete       Today   Subjective:   Chase Lloyd today has no headache,no chest or abdominal pain,no new weakness tingling or numbness, feels much better wants to go home today.   Objective:   Blood pressure (!) 151/68, pulse 97, temperature 97.9 F (36.6 C), temperature source Oral, resp. rate 12, height 5\' 10"  (1.778 m), weight 81.9 kg, SpO2 98 %.  No intake or output data in the 24 hours ending 10/12/18 1200  Exam Awake Alert, Oriented x 3, No new F.N deficits, Normal affect  Symmetrical Chest wall movement, air entry significantly improved, only mildly diminished, minimal end expiratory wheezing  RRR,No  Gallops,Rubs or new Murmurs, No Parasternal Heave +ve B.Sounds, Abd Soft, Non tender, No organomegaly appriciated, No rebound -guarding or rigidity. No Cyanosis, Clubbing , mild pedal edema, No new Rash or bruise  Data Review   CBC w Diff:  Lab Results  Component Value Date   WBC 7.9 10/10/2018   HGB 11.0 (L) 10/10/2018   HCT 35.2 (L) 10/10/2018   PLT 143 (L) 10/10/2018   LYMPHOPCT 15 10/06/2018   MONOPCT 8 10/06/2018   EOSPCT 1 10/06/2018   BASOPCT 1 10/06/2018    CMP:  Lab Results  Component Value Date   NA 138 10/12/2018   K 4.0 10/12/2018   CL 100 10/12/2018   CO2 28 10/12/2018   BUN 21 10/12/2018   CREATININE 1.19 10/12/2018   PROT 6.6 10/06/2018   ALBUMIN 3.4 (L) 10/06/2018   BILITOT 0.7 10/06/2018   ALKPHOS 84 10/06/2018   AST 31 10/06/2018   ALT 26 10/06/2018  .   Total Time in preparing paper work, data evaluation and todays exam - 62 minutes  Phillips Climes M.D on 10/12/2018 at 12:00 PM  [facility]   Office  [phone]

## 2018-10-12 NOTE — Progress Notes (Signed)
Pt seen by MD, orders written for d/c.  Went over discharge instructions with pt and answered all questions.  Removed IV, and telemetry.  Escorted for discharge via wheelchair with all belongings.  Will follow up outpatient with MD. 

## 2018-10-12 NOTE — Care Management Note (Signed)
Case Management Note  Patient Details  Name: Chase Lloyd MRN: [id number] Date of Birth: 12-24-1955  Subjective/Objective:    From home, pta indep,  For dc today, will need home oxygen , NCM informed him of different oxygen companies , he would like to work with [facility], referral given to [place] with [facility], she will bring oxygen to patient room prior to dc.                 Action/Plan: DC home when oxygen received to patient room.  Expected Discharge Date:  10/12/18               Expected Discharge Plan:  Home/Self Care  In-House Referral:     Discharge planning Services  CM Consult  Post Acute Care Choice:    Choice offered to:  Patient  DME Arranged:  Oxygen DME Agency:  [place]:    [facility] Agency:     Status of Service:  Completed, signed off  If discussed at [place] of Stay Meetings, dates discussed:    Additional Comments:  Zenon Mayo, RN 10/12/2018, 12:58 PM

## 2018-10-13 ENCOUNTER — Encounter: Payer: Self-pay | Admitting: Pulmonary Disease

## 2018-10-13 ENCOUNTER — Telehealth: Payer: Self-pay

## 2018-10-13 ENCOUNTER — Ambulatory Visit (INDEPENDENT_AMBULATORY_CARE_PROVIDER_SITE_OTHER): Payer: Medicare Other | Admitting: Pulmonary Disease

## 2018-10-13 ENCOUNTER — Telehealth: Payer: Self-pay | Admitting: *Deleted

## 2018-10-13 VITALS — BP 156/76 | HR 114 | Wt 183.6 lb

## 2018-10-13 DIAGNOSIS — J439 Emphysema, unspecified: Secondary | ICD-10-CM

## 2018-10-13 DIAGNOSIS — R942 Abnormal results of pulmonary function studies: Secondary | ICD-10-CM

## 2018-10-13 DIAGNOSIS — J984 Other disorders of lung: Secondary | ICD-10-CM | POA: Diagnosis not present

## 2018-10-13 DIAGNOSIS — R0602 Shortness of breath: Secondary | ICD-10-CM

## 2018-10-13 DIAGNOSIS — J441 Chronic obstructive pulmonary disease with (acute) exacerbation: Secondary | ICD-10-CM

## 2018-10-13 LAB — CULTURE, BODY FLUID W GRAM STAIN -BOTTLE: Culture: NO GROWTH

## 2018-10-13 MED ORDER — BUDESONIDE-FORMOTEROL FUMARATE 160-4.5 MCG/ACT IN AERO: 2.0000 | INHALATION_SPRAY | Freq: Two times a day (BID) | RESPIRATORY_TRACT | 0 refills | Status: DC

## 2018-10-13 MED ORDER — PREDNISONE 10 MG PO TABS: ORAL_TABLET | ORAL | 0 refills | Status: DC

## 2018-10-13 MED ORDER — TIOTROPIUM BROMIDE MONOHYDRATE 2.5 MCG/ACT IN AERS: 2.0000 | INHALATION_SPRAY | Freq: Every day | RESPIRATORY_TRACT | 0 refills | Status: DC

## 2018-10-13 NOTE — Patient Instructions (Addendum)
PROVIDED Symbicort and Spiriva samples.  COMPLETE steroid taper as directed.  STOP BY the lab to complete your blood draw.  ORDERED CXR  SCHEDULED HRCT Chest in 3-6 months for to evaluate lung parenchyma when we have optimized your volume status  USE albuterol nebulizer every 4 hours as need for shortness of breath or wheezing  REFER to Pulmonary Rehab

## 2018-10-13 NOTE — Telephone Encounter (Signed)
Transition Care Management Follow-up Telephone Call  Attempted to schedule patient an appointment, but was declined at this time.  Discharge Date:  10/12/2018   Primary MD  [name], NP  Recommendations for primary care physician for things to follow:  -Please check CBC, BMP to ensure stable renal function after initiation of diuresis  Admission Diagnosis  Hypertensive emergency [I16.1] Dyspnea, unspecified type [R06.00]   Discharge Diagnosis  Hypertensive emergency [I16.1] Dyspnea, unspecified type [R06.00]    Principal Problem:   COPD with acute exacerbation ([place]) Active Problems:   Acute respiratory distress   Hypertensive urgency   Troponin I above reference range   Dyspnea         How have you been since you were released from the hospital? alright   Do you understand why you were in the hospital? yes   Do you understand the discharge instructions? yes   Where were you discharged to? home   Items Reviewed:  Medications reviewed: yes  Allergies reviewed: yes  Dietary changes reviewed: yes  Referrals reviewed: yes   Functional Questionnaire:   Activities of Daily Living (ADLs):   He states they are independent in the following: none States they require assistance with the following: none   Any transportation issues/concerns?: no   Any patient concerns? no   Confirmed importance and date/time of follow-up visits scheduled -no   Provider Appointment booked with  Confirmed with patient if condition begins to worsen call PCP or go to the ER.  Patient was given the office number and encouraged to call back with question or concerns.  : yes

## 2018-10-13 NOTE — Progress Notes (Signed)
Synopsis: Referred in 10/2018 for hospital follow-up  Subjective:   PATIENT ID: Chase Lloyd GENDER: male DOB: 1956-08-11, MRN: [id number]   HPI  Chief Complaint  Patient presents with  . [facility] follow up - still wheezing and SOB - states home oxygen to be set up today   Chase Lloyd is a 62 year old male former smoker (100 pack years) with severe obstructive and restrictive defect (FEV1 38%), history of Hodgkin's status post chemoradiation 1999, hypertension, DDD status post recent surgery in 07/2018 who presents as hospital follow-up for recent COPD exacerbation.  He has had chronic dyspnea managed by Dr. Melvyn Novas at [facility].  He notes that his dyspnea became markedly worse after his surgery in September when he was immobile for several weeks due to pain.  He recently had an outpatient CT lung cancer screening completed which demonstrated bilateral pleural effusions.  These findings in combination with his worsening dyspnea from prompted his recent admission to the hospital from 12/5 to 10/12/18.  He will underwent thoracentesis with drainage of 350 cc which helped relieve his symptoms.  He was treated with bronchodilators, diuretics, steroids and antibiotics.  He was discharged home yesterday with new O2 requirement and steroid taper.   Today, he reports unchanged dyspnea on exertion. Wife is present to provide history.  She feels his wheezing is similar to prior hospitalization and expresses concern that he may end up in the hospital again.  He feels activity is limited due to his breathing and back pain. Reports orthopnea, dizziness and general weakness. Denies chest pain, headaches, leg swelling.  100-pack-year history. Environmental exposures: Radiation, foundry, Engineer, manufacturing, Furniture conservator/restorer   I have personally reviewed patient's past medical/family/social history, allergies, current medications.  Past Medical History:  Diagnosis Date  . Back pain   . COPD  (chronic obstructive pulmonary disease) ([place])   . DDD (degenerative disc disease), lumbar   . Depression   . Emphysema of lung ([place])   . Empyema ([place])    2016  (was in [place])  . GERD (gastroesophageal reflux disease)   . Hearing loss of right ear    started 2001.     microphone in right ear ,  and hearing aid in left  . Hodgkin's disease ([place]) 1999  . Hypotension   . Meniere's disease   . Pneumonia   . Tinnitus      Family History  Problem Relation Age of Onset  . Hypertension Father   . Diabetes Father   . Testicular cancer Brother   . Melanoma Brother      Social History   Occupational History  . Occupation: Retired  Tobacco Use  . Smoking status: Former Smoker    Packs/day: 1.50    Years: 47.00    Pack years: 70.50    Types: Cigarettes    Last attempt to quit: 06/13/2018    Years since quitting: 0.3  . Smokeless tobacco: Never Used  Substance and Sexual Activity  . Alcohol use: Yes    Comment: 24 cans in a week  . Drug use: No  . Sexual activity: Not on file    No Known Allergies   Outpatient Medications Prior to Visit  Medication Sig Dispense Refill  . acetaminophen (TYLENOL) 325 MG tablet Take 2 tablets (650 mg total) by mouth every 6 (six) hours as needed for mild pain (or Fever >/= 101).    Marland Kitchen albuterol (PROVENTIL) (2.5 MG/3ML) 0.083% nebulizer solution Take 3 mLs (2.5 mg  total) by nebulization every 6 (six) hours as needed for wheezing or shortness of breath. 150 mL 3  . Albuterol Sulfate 108 (90 Base) MCG/ACT AEPB Inhale 1-2 puffs into the lungs every 6 (six) hours as needed.    Marland Kitchen azithromycin (ZITHROMAX) 250 MG tablet Take 1 tablet (250 mg total) by mouth daily. 4 each 0  . furosemide (LASIX) 20 MG tablet Take 1 tablet (20 mg total) by mouth daily. 30 tablet 11  . omeprazole (PRILOSEC) 40 MG capsule Take 1 capsule (40 mg total) by mouth daily. 90 capsule 3  . predniSONE (STERAPRED UNI-PAK 21 TAB) 10 MG (21) TBPK tablet Use per package instruction 21  tablet 0  . SYMBICORT 160-4.5 MCG/ACT inhaler Inhale 2 puffs into the lungs 2 (two) times daily. 1 Inhaler 0  . Tiotropium Bromide Monohydrate (SPIRIVA RESPIMAT) 2.5 MCG/ACT AERS Inhale 2 puffs into the lungs daily. 1 Inhaler 2   No facility-administered medications prior to visit.     Review of Systems  Constitutional: Positive for malaise/fatigue. Negative for chills, diaphoresis, fever and weight loss.  HENT: Positive for hearing loss. Negative for congestion.   Respiratory: Positive for cough, shortness of breath and wheezing. Negative for hemoptysis, sputum production and stridor.   Cardiovascular: Positive for orthopnea. Negative for chest pain and leg swelling.  Gastrointestinal: Positive for diarrhea. Negative for abdominal pain, heartburn and nausea.  Genitourinary: Negative for frequency.  Musculoskeletal: Positive for back pain.  Skin: Negative for rash.  Neurological: Positive for dizziness and weakness. Negative for loss of consciousness.  Endo/Heme/Allergies: Negative for environmental allergies.  Psychiatric/Behavioral: Negative for depression.     Objective:  Physical Exam   Vitals:   10/13/18 1607  BP: (!) 156/76  Pulse: (!) 114  SpO2: 98%  Weight: 183 lb 9.6 oz (83.3 kg)   SpO2: 98 % O2 Device: None (Room air)  Physical Exam: General: Well-appearing, no acute distress HENT: [place], AT Eyes: EOMI, no scleral icterus Respiratory: Decreased breath sounds bilaterally.  No crackles, wheezing or rales Cardiovascular: RRR, -M/R/G, no JVD GI: BS+, soft, nontender Extremities:-Edema,-tenderness Neuro: AAO x4, CNII-XII grossly intact Skin: Intact, no rashes or bruising Psych: Normal mood, normal affect  Chest imaging: CTA 10/06/2018-no PE, emphysema, small bilateral pleural effusions  PFT 9.30.19: Severe obstructive and restrictive defect with severely reduced DLCO. Significant bronchodilator effect present  TTE 10/07/2018: EF 60 to 81%, grade 1 diastolic  dysfunction, no WMA, moderate AR  I have personally reviewed the above labs, images and tests noted above.    Assessment & Plan:  #Severe obstructive and restrictive defect #Dyspnea  #Abnormal CT  Discussion: 62 year old male former smoker (100 pack years) with severe obstructive and restrictive defect (FEV1 38%), history of Hodgkin's status post chemoradiation, DDD status post recent surgery in 07/2018 who presents for hospital follow-up for recent COPD exacerbation.  His dyspnea is likely multifactorial in setting of COPD exacerbation with underlying restrictive disease and deconditioning from his recent surgery.  CT lung screen concerning for right lung base masslike distortion however difficult to discern in setting of pleural effusions.  Will need to optimize volume status and repeat CT imaging before further recommendations can be provided.  PROVIDED Symbicort and Spiriva samples.  COMPLETE steroid taper as directed.  STOP BY the lab to complete your blood draw.  ORDERED CXR  SCHEDULED HRCT Chest in 3-6 months for to evaluate lung parenchyma when we have optimized your volume status  USE albuterol nebulizer every 4 hours as need for shortness of breath  or wheezing  REFER to Pulmonary Rehab  Orders Placed This Encounter  Procedures  . DG Chest 1 View    Standing Status:   Future    Standing Expiration Date:   12/15/2019    Order Specific Question:   Reason for Exam (SYMPTOM  OR DIAGNOSIS REQUIRED)    Answer:   effusions    Order Specific Question:   Preferred imaging location?    Answer:   [facility]    Order Specific Question:   Radiology Contrast Protocol - do NOT remove file path    Answer:   \\[email or web address]\Radiant\[email or web address].pdf  . CT Chest High Resolution    Standing Status:   Future    Standing Expiration Date:   04/14/2019    Scheduling Instructions:     Please schedule 3-6 months (4 months)    Order Specific Question:   ** REASON FOR EXAM  (FREE TEXT)    Answer:   restrictive lung disease    Order Specific Question:   Preferred imaging location?    Answer:   [facility] [address] [facility]    Order Specific Question:   Radiology Contrast Protocol - do NOT remove file path    Answer:   \\[email or web address]\Radiant\CTProtocols.pdf  . CBC with Differential  . Comp Met (CMET)    Standing Status:   Future    Number of Occurrences:   1    Standing Expiration Date:   10/13/2019  . AMB referral to pulmonary rehabilitation    Referral Priority:   Routine    Referral Type:   Consultation    Number of Visits Requested:   1   Meds ordered this encounter  Medications  . predniSONE (DELTASONE) 10 MG tablet    Sig: Prednisone taper: 23m x 3 days 40 mg x 3 days 30 mg x 3 days 20 mg x 3 days 10 mg x 3 days    Dispense:  45 tablet    Refill:  0  . budesonide-formoterol (SYMBICORT) 160-4.5 MCG/ACT inhaler    Sig: Inhale 2 puffs into the lungs 2 (two) times daily for 1 day.    Dispense:  1 Inhaler    Refill:  0  . Tiotropium Bromide Monohydrate (SPIRIVA RESPIMAT) 2.5 MCG/ACT AERS    Sig: Inhale 2 puffs into the lungs daily for 1 day.    Dispense:  1 Inhaler    Refill:  0    No follow-ups on file.   Thank you for choosing [facility] for your health needs!   Ramsha Lonigro JRodman Pickle MD [facility] 10/13/2018 9:31 PM  Personal pager: #[phone] unanswered, please page CCM On-call: #[phone]

## 2018-10-13 NOTE — Telephone Encounter (Signed)
Contacted patient to come in at 3:30 pm today 10/13/18 for appt with Dr. Loanne Drilling.  Was scheduled for 4:30pm but able to move appt to earlier slot per MD.

## 2018-10-13 NOTE — Progress Notes (Signed)
Please see note by Dr. Loanne Drilling

## 2018-10-14 LAB — CBC WITH DIFFERENTIAL/PLATELET
Basophils Relative: 0 % (ref 0.0–3.0)
Eosinophils Relative: 1 % (ref 0.0–5.0)
HCT: 39.2 % (ref 39.0–52.0)
HEMOGLOBIN: 13.3 g/dL (ref 13.0–17.0)
LYMPHS PCT: 23 % (ref 12.0–46.0)
MCHC: 33.9 g/dL (ref 30.0–36.0)
MCV: 95.3 fl (ref 78.0–100.0)
Monocytes Relative: 12 % (ref 3.0–12.0)
Neutrophils Relative %: 64 % (ref 43.0–77.0)
Platelets: 180 10*3/uL (ref 150.0–400.0)
RBC: 4.11 Mil/uL — ABNORMAL LOW (ref 4.22–5.81)
RDW: 14.1 % (ref 11.5–15.5)
WBC: 9.1 10*3/uL (ref 4.0–10.5)

## 2018-10-14 LAB — COMPREHENSIVE METABOLIC PANEL
ALBUMIN: 3.8 g/dL (ref 3.5–5.2)
ALT: 250 U/L — ABNORMAL HIGH (ref 0–53)
AST: 103 U/L — ABNORMAL HIGH (ref 0–37)
Alkaline Phosphatase: 82 U/L (ref 39–117)
BUN: 18 mg/dL (ref 6–23)
CO2: 34 mEq/L — ABNORMAL HIGH (ref 19–32)
Calcium: 8.9 mg/dL (ref 8.4–10.5)
Chloride: 97 mEq/L (ref 96–112)
Creatinine, Ser: 1.04 mg/dL (ref 0.40–1.50)
GFR: 76.39 mL/min (ref >60.00)
Glucose, Bld: 109 mg/dL — ABNORMAL HIGH (ref 70–99)
Potassium: 4.5 mEq/L (ref 3.5–5.1)
Sodium: 136 mEq/L (ref 135–145)
Total Bilirubin: 0.8 mg/dL (ref 0.2–1.2)
Total Protein: 6.8 g/dL (ref 6.0–8.3)

## 2018-10-15 ENCOUNTER — Ambulatory Visit (INDEPENDENT_AMBULATORY_CARE_PROVIDER_SITE_OTHER)
Admission: RE | Admit: 2018-10-15 | Discharge: 2018-10-15 | Disposition: A | Discharge status: Home / Self Care | Payer: Medicare Other | Source: Ambulatory Visit | Attending: Pulmonary Disease | Admitting: Pulmonary Disease

## 2018-10-15 ENCOUNTER — Other Ambulatory Visit: Payer: Self-pay | Admitting: Pulmonary Disease

## 2018-10-15 DIAGNOSIS — R0602 Shortness of breath: Secondary | ICD-10-CM

## 2018-10-16 DIAGNOSIS — J984 Other disorders of lung: Secondary | ICD-10-CM | POA: Insufficient documentation

## 2018-10-16 DIAGNOSIS — J439 Emphysema, unspecified: Secondary | ICD-10-CM

## 2018-10-16 DIAGNOSIS — J449 Chronic obstructive pulmonary disease, unspecified: Secondary | ICD-10-CM | POA: Insufficient documentation

## 2018-10-18 ENCOUNTER — Telehealth: Payer: Self-pay | Admitting: Internal Medicine

## 2018-10-18 ENCOUNTER — Telehealth: Payer: Self-pay

## 2018-10-18 NOTE — Telephone Encounter (Signed)
I spoke to Chase Lloyd at [facility].  He states O2 was sent home with the pt from the hospital.  The patient will need to contact [facility] & sign a release for them to send records over to [place] and then [facility] can provide his O2.

## 2018-10-18 NOTE — Telephone Encounter (Signed)
Called and spoke with patients wife, advised her of response below. Nothing further needed.

## 2018-10-18 NOTE — Telephone Encounter (Signed)
Spoke with patient to give CXR results.  Dr. Loanne Drilling reviewed and noted small pleural effusion but improved compared to last CT.  Nothing further needed at this time.

## 2018-10-18 NOTE — Telephone Encounter (Signed)
I don't see where we have sent in O2 order to anybody.  Called Jason at [facility] & left vm for him to check & see who ordered O2 and to give me a call back.

## 2018-10-22 ENCOUNTER — Ambulatory Visit (INDEPENDENT_AMBULATORY_CARE_PROVIDER_SITE_OTHER): Payer: Medicare Other | Admitting: Internal Medicine

## 2018-10-22 ENCOUNTER — Encounter: Payer: Self-pay | Admitting: Internal Medicine

## 2018-10-22 VITALS — BP 126/64 | HR 105 | Ht 71.0 in | Wt 184.4 lb

## 2018-10-22 DIAGNOSIS — J441 Chronic obstructive pulmonary disease with (acute) exacerbation: Secondary | ICD-10-CM | POA: Diagnosis not present

## 2018-10-22 DIAGNOSIS — J449 Chronic obstructive pulmonary disease, unspecified: Secondary | ICD-10-CM

## 2018-10-22 DIAGNOSIS — R0609 Other forms of dyspnea: Secondary | ICD-10-CM

## 2018-10-22 DIAGNOSIS — K219 Gastro-esophageal reflux disease without esophagitis: Secondary | ICD-10-CM

## 2018-10-22 LAB — PULMONARY FUNCTION TEST
DL/VA % pred: 67 %
DL/VA: 3.14 ml/min/mmHg/L
DLCO cor % pred: 43 %
DLCO cor: 14.51 ml/min/mmHg
DLCO unc % pred: 37 %
DLCO unc: 12.79 ml/min/mmHg
FEF 25-75 Post: 0.68 L/sec
FEF 25-75 Pre: 0.47 L/sec
FEF2575-%Change-Post: 43 %
FEF2575-%Pred-Post: 23 %
FEF2575-%Pred-Pre: 16 %
FEV1-%Change-Post: 14 %
FEV1-%Pred-Post: 35 %
FEV1-%Pred-Pre: 30 %
FEV1-Post: 1.26 L
FEV1-Pre: 1.1 L
FEV1FVC-%Change-Post: -3 %
FEV1FVC-%Pred-Pre: 61 %
FEV6-%Change-Post: 13 %
FEV6-%PRED-PRE: 51 %
FEV6-%Pred-Post: 58 %
FEV6-PRE: 2.35 L
FEV6-Post: 2.68 L
FEV6FVC-%Change-Post: -4 %
FEV6FVC-%Pred-Post: 98 %
FEV6FVC-%Pred-Pre: 102 %
FVC-%Change-Post: 18 %
FVC-%PRED-POST: 59 %
FVC-%PRED-PRE: 50 %
FVC-POST: 2.87 L
FVC-Pre: 2.42 L
POST FEV6/FVC RATIO: 93 %
Post FEV1/FVC ratio: 44 %
Pre FEV1/FVC ratio: 46 %
Pre FEV6/FVC Ratio: 97 %
RV % pred: 129 %
RV: 3.07 L
TLC % pred: 81 %
TLC: 5.86 L

## 2018-10-22 LAB — CBC WITH DIFFERENTIAL/PLATELET
BASOS ABS: 0 10*3/uL (ref 0.0–0.1)
Basophils Relative: 0.5 % (ref 0.0–3.0)
Eosinophils Absolute: 0 10*3/uL (ref 0.0–0.7)
Eosinophils Relative: 0.3 % (ref 0.0–5.0)
HCT: 39.3 % (ref 39.0–52.0)
Hemoglobin: 13.3 g/dL (ref 13.0–17.0)
Lymphocytes Relative: 7.8 % — ABNORMAL LOW (ref 12.0–46.0)
Lymphs Abs: 0.8 10*3/uL (ref 0.7–4.0)
MCHC: 33.7 g/dL (ref 30.0–36.0)
MCV: 96.1 fl (ref 78.0–100.0)
Monocytes Absolute: 0.4 10*3/uL (ref 0.1–1.0)
Monocytes Relative: 4.1 % (ref 3.0–12.0)
NEUTROS PCT: 87.3 % — AB (ref 43.0–77.0)
Neutro Abs: 8.5 10*3/uL — ABNORMAL HIGH (ref 1.4–7.7)
Platelets: 158 10*3/uL (ref 150.0–400.0)
RBC: 4.09 Mil/uL — ABNORMAL LOW (ref 4.22–5.81)
RDW: 14.3 % (ref 11.5–15.5)
WBC: 9.8 10*3/uL (ref 4.0–10.5)

## 2018-10-22 LAB — BASIC METABOLIC PANEL
BUN: 16 mg/dL (ref 6–23)
CO2: 33 mEq/L — ABNORMAL HIGH (ref 19–32)
Calcium: 9.4 mg/dL (ref 8.4–10.5)
Chloride: 97 mEq/L (ref 96–112)
Creatinine, Ser: 1.11 mg/dL (ref 0.40–1.50)
GFR: 70.85 mL/min (ref >60.00)
Glucose, Bld: 109 mg/dL — ABNORMAL HIGH (ref 70–99)
Potassium: 4.5 mEq/L (ref 3.5–5.1)
Sodium: 137 mEq/L (ref 135–145)

## 2018-10-22 MED ORDER — BUDESONIDE-FORMOTEROL FUMARATE 160-4.5 MCG/ACT IN AERO: 2.0000 | INHALATION_SPRAY | Freq: Two times a day (BID) | RESPIRATORY_TRACT | 0 refills | Status: DC

## 2018-10-22 MED ORDER — OMEPRAZOLE 40 MG PO CPDR: DELAYED_RELEASE_CAPSULE | ORAL | 3 refills | Status: DC

## 2018-10-22 MED ORDER — BUDESONIDE-FORMOTEROL FUMARATE 160-4.5 MCG/ACT IN AERO: 2.0000 | INHALATION_SPRAY | Freq: Two times a day (BID) | RESPIRATORY_TRACT | 11 refills | Status: DC

## 2018-10-22 NOTE — Progress Notes (Signed)
Subjective:   Patient ID: Chase Lloyd, male    DOB: [birth year],     MRN: [id number]      Brief patient profile:  2 yowm quit smoking 06/2018  from  [facility]  first aware of doe 2013 dx as copd rx symbicort helped a lot then pna/ sepsis Feb 2017 and added spiriva and rx 02 short term and moved to [place] may 2018 and referred to pulmonary clinic 05/29/2017 by [name] with GOLD III criteria at initial eval    History of Present Illness  05/29/2017 1st [facility] office visit/ Chase Lloyd  Maint rx symb 160 2bid / spiriva dpi  Chief Complaint  Patient presents with  . Pulmonary Consult    Pt referred by Dr. [name] for COPD. Pt c/o DOE, prod cough with yellow mucus. Pt denies CP/tightness and f/c/s. Pt states he rarely uses his albuterol hfa.   ok at rest, sleeps fine with improving cough since quit smoking  Left leg swelling x months assoc with hip pain > ortho following with 'neg sonogram"  per pt  rec Plan A = Automatic = Symbicort 160 Take 2 puffs first thing in am and then another 2 puffs about 12 hours later and spiriva 2 puffs in am only (ok to use up the powder form if you want)  Work on inhaler technique:  relax and gently blow all the way out then take a nice smooth deep breath back in, triggering the inhaler at same time you start breathing in.  Hold for up to 5 seconds if you can. Blow out thru nose. Rinse and gargle with water when done Plan B = Backup Only use your albuterol as a rescue medication   07/30/2017  f/u ov/Chase Lloyd re:  GOLD III with reversibility / symb 160/spiriva smi Chief Complaint  Patient presents with  . Follow-up    PFT's done today. His breathing is doing well. He has not had to use his rescue inhaler. No new co's today.   Not limited by breathing from desired activities but  Due to back and hip and leg issues  Sleeping fine/ no noct saba or am flares rec Work on maintaining perfect inhaler technique:  relax and gently blow all the way out then take  a nice smooth deep breath back in, triggering the inhaler at same time you start breathing in.    04/28/2018 acute extended ov/Chase Lloyd re:  GOLD III/  Chief Complaint  Patient presents with  . Acute Visit    increased SOB x 2 months. He also c/o occ cough- prod at times with yellow sputum. He states he was just told by PCP today that he had bronchitis based on recent cxr 04/26/18. He is using his albuterol inhaler about every other day.   dyspnea gradually worse x 2 months to point = .MMRC2 = can't walk a nl pace on a flat grade s sob but does fine slow and flat but also limited back and L hip pain 02 none  saba qod  Dysphagia x years on prilosec q am but not ac  Off spiriva x months not convinced made any difference Already on pred/doxy per PCP since 04/26/18  rec Try taking omeprazole Take 30-60 min before first meal of the day  GERD diet   Plan A = Automatic = Symbicort  160 Take 2 puffs first thing in am and then another 2 puffs about 12 hours later.  Plan B = Backup Only use your albuterol  as a rescue medication   Finish the prednisone and antibiotic arleady prescribed  The key is to stop smoking completely before smoking completely stops you!  Please schedule a follow up visit in 3 months but call sooner if needed pft's > not done       09/10/18 NP eval/ aecopd aecopd x one week rec Continue Symbicort 160  Restart Spiriva Respimat 2.5 Continue prednisone Continue doxycycline DuoNeb nebulized medications prescribed today >>> If using more than 1-2 times a day please contact our office    09/21/2018 extended f/u  ov/Chase Lloyd re:  GOLD III/ group D with refractory sob  Chief Complaint  Patient presents with  . Follow-up    Breathing is worse x 2 days. He is winded just walking from room to room at home. He is not sleeping well. He is having a right temporal HA.  He is coughing with yellow sputum.    Dyspnea:  Room to room, neb helps the most  Cough: thick yellow  Esp in am    Sleeping: poorly due to sob one pillow either side  SABA use: both hfa and neb now but uses grandson's neb / hfa poor  02: no 02  rec Plan A = Automatic = symbicort 160 Take 2 puffs first thing in am and then another 2 puffs about 12 hours later ok to use spirviva in evening = 1.25 (blue top) x 4 or 2.5 (green) x 2  Plan B = Backup Only use your albuterol inhaler as a rescue medication Plan C = Crisis - only use your albuterol nebulizer if you first try Plan B and it fails to help > ok to use the nebulizer up to every 4 hours but if start needing it regularly call for immediate appointment Plan D = Doctor - call me if B and C not adequate Plan E = ER - go to ER or call 911 if all else fails   Prednisone  10 mg Take 4 for three days 3 for three days 2 for three days 1 for three days and stop  Levaquin 500 mg one daily x 7 days - stop if aches in tendons  While coughing / wheezing > prilosec 40 mg Take 30- 60 min before your first and last meals of the day  GERD   Please schedule a follow up office visit in 4 weeks     Admission date:  10/06/2018   Discharge Date:  10/12/2018    Recommendations for primary care physician for things to follow:  -Please check CBC, BMP to ensure stable renal function after initiation of diuresis  Admission Diagnosis  Hypertensive emergency [I16.1] Dyspnea, unspecified type [R06.00]   Discharge Diagnosis  Hypertensive emergency [I16.1] Dyspnea, unspecified type [R06.00]   Principal Problem:   COPD with acute exacerbation ([name]) Active Problems:   Acute respiratory distress   Hypertensive urgency   Troponin I above reference range   Dyspnea               RUE:AVWUJW L [name] a 62 y.o.malewith medical history significant foremphysema, tobacco abuse in remission, and degenerative disc disease, now presenting to the emergency department for evaluation of progressive shortness of breath and cough despite completing 2 courses of  antibiotics and prednisone over the past 1 month. Patient reports that his chronic dyspnea and cough had worsened a little over a month ago, he was seen by his PCP at that time, started on doxycycline and prednisone, failed to have any significant  improvement, and then followed up with his pulmonologist about 2 weeks ago. Pulmonologist started Levaquin and prednisone, but he continued to have increased shortness of breath and cough. There has not been any chest pain, fevers, chills, or leg swelling or tenderness associated with this. He underwent a lung cancer screening CT on 10/04/2018 with possible pneumonia versus atelectasis versus neoplasm, as well as left pleural effusion and suspected interstitial edema. He was contacted by his pulmonology clinic and directed to the ED for evaluation of this.   ED Course:Upon arrival to the ED, patient is found to be afebrile, saturating mid 90s on room air, slightly tachypneic, mildly tachycardic, and with blood pressure 205/87. EKG features a sinus tachycardia with rate 107, right axis deviation, and nonspecific ST-T abnormality that appears similar to prior. CTA chest was performed and is negative for PE, but notable for small bilateral pleural effusions and advanced emphysema. Blood cultures were collected in the ED and the patient was treated with labetalol with improvement in his blood pressure. Hospitalist were asked to admit for further evaluation and management.  Hospital Course   62 y.o.malewith medical history significant foremphysema, tobacco abuse in remission, and degenerative disc disease, now presenting to the emergency department for evaluation of progressive shortness of breath and cough despite completing 2 courses of antibiotics and prednisone over the past 1 month. Work-up significant for pleural effusion, small, and COPD exacerbation.  Acute Hypoxic respiratory failure -Secondary to COPD exacerbation, and small pleural  effusion, he qualified for oxygen as dropped to 87% on ambulation   COPD with acute exacerbation- severe emphysema -Reports progressive SOB and cough despite completing 2 courses of antibiotics and prednisone -Found to be tachycardic with slight elevation in troponin and CTA was performed, negative for PE or other acute findings - echo: normal EF, grade 1 diastolic, normal wall motion -Was on IV steroids during hospital stay, wheezing has significantly improved, he will be discharged on prednisone taper, as well to finish another 4 days of oral azithromycin given his productive cough .  Small pleural effusion -s/p drainage in IR, total of 350 cc were removed, Significant for exudative effusion as LDH ratio is 0.68, but so far Gram stain is negative, cultures remains negative -Encouraged to use incentive spirometry -Collagen with no evidence of malignant cells  Acute on chronic diastolic CHF -2D echo with a preserved EF, but grade 1 diastolic dysfunction, has lower extremity edema, improving with diuresis,w will be discharged on low dose Lasix  AKI -Creatinine peaked at 1.3 today, secondary to diuresis, improved with holding diuretics, he will be discharged on low-dose LasixHypertensive urgency -Blood pressure elevated on admission, most likely in the setting of respiratory distress, currently blood pressure controlled without any medications  Troponin elevation -CE negative      10/22/2018  f/u ov/Chase Lloyd re:  GOLD III / still on 30 mg pred / no longer smoking at all maint on symb/spiriva Chief Complaint  Patient presents with  . Follow-up    Pt has some SOB with exertion, productive cough-yellow, and some wheezing in last week. He rarely uses his albuterol inhaler. He uses his neb 3 x daily.   Dyspnea:  MMRC3 = can't walk 100 yards even at a slow pace at a flat grade s stopping due to sob   Cough:  Better / some am congestion  Sleeping: bed is flat, sleeps on  side SABA use: albuterol neb w/in 15 min of rising daily  02: 2llpm 24/7   No obvious day  to day or daytime variability or assoc ongoing  excess/ purulent sputum or mucus plugs or hemoptysis or cp or chest tightness, s  or overt sinus or hb symptoms.   Sleeps as above without nocturnal  or early am exacerbation  of respiratory  c/o's or need for noct saba. Also denies any obvious fluctuation of symptoms with weather or environmental changes or other aggravating or alleviating factors except as outlined above   No unusual exposure hx or h/o childhood pna/ asthma or knowledge of premature birth.  Current Allergies, Complete Past Medical History, Past Surgical History, Family History, and Social History were reviewed in [employer] record.  ROS  The following are not active complaints unless bolded Hoarseness, sore throat, dysphagia, dental problems, itching, sneezing,  nasal congestion or discharge of excess mucus or purulent secretions, ear ache,   fever, chills, sweats, unintended wt loss or wt gain, classically pleuritic or exertional cp,  orthopnea pnd or arm/hand swelling  or leg swelling resolved, presyncope, palpitations, abdominal pain, anorexia, nausea, vomiting, diarrhea  or change in bowel habits or change in bladder habits, change in stools or change in urine, dysuria, hematuria,  rash, arthralgias, visual complaints, headache, numbness, weakness or ataxia or problems with walking or coordination,  change in mood or  memory.        Current Meds  Medication Sig  . acetaminophen (TYLENOL) 325 MG tablet Take 2 tablets (650 mg total) by mouth every 6 (six) hours as needed for mild pain (or Fever >/= 101).  Marland Kitchen albuterol (PROVENTIL) (2.5 MG/3ML) 0.083% nebulizer solution Take 3 mLs (2.5 mg total) by nebulization every 6 (six) hours as needed for wheezing or shortness of breath.  . Albuterol Sulfate 108 (90 Base) MCG/ACT AEPB Inhale 1-2 puffs into the lungs every 6 (six)  hours as needed.  . furosemide (LASIX) 20 MG tablet Take 1 tablet (20 mg total) by mouth daily.  Marland Kitchen omeprazole (PRILOSEC) 40 MG capsule Take 30- 60 min before your first and last meals of the day  . predniSONE (DELTASONE) 10 MG tablet Prednisone taper: 50mg  x 3 days 40 mg x 3 days 30 mg x 3 days 20 mg x 3 days 10 mg x 3 days  . predniSONE (STERAPRED UNI-PAK 21 TAB) 10 MG (21) TBPK tablet Use per package instruction  . SYMBICORT 160-4.5 MCG/ACT inhaler Inhale 2 puffs into the lungs 2 (two) times daily.  . Tiotropium Bromide Monohydrate (SPIRIVA RESPIMAT) 2.5 MCG/ACT AERS Inhale 2 puffs into the lungs daily.  .     .               Objective:   Physical Exam  amb wm slt hoarse     10/22/2018      184 09/21/2018      179  04/28/2018        173  07/30/2017        173  05/29/17 168 lb (76.2 kg)  05/01/17 164 lb (74.4 kg)  04/15/17 162 lb (73.5 kg)     Vital signs reviewed - Note on arrival 02 sats  98% on RA     HEENT: Full dentures/ nl oropharynx. Nl external ear canals without cough reflex -  Mild bilateral non-specific turbinate edema     NECK :  without JVD/Nodes/TM/ nl carotid upstrokes bilaterally   LUNGS: no acc muscle use,  Mod barrel  contour chest wall with bilateral  Distant bs s audible wheeze and  without cough on insp  or exp maneuver and mod  Hyperresonant  to  percussion bilaterally     CV:  RRR  no s3 or murmur or increase in P2, and no edema   ABD:  soft and nontender with pos mid insp Hoover's  in the supine position. No bruits or organomegaly appreciated, bowel sounds nl  MS:   Nl gait/  ext warm without deformities, calf tenderness, cyanosis or clubbing No obvious joint restrictions   SKIN: warm and dry without lesions    NEURO:  alert, approp, nl sensorium with  no motor or cerebellar deficits apparent.         I personally reviewed images and agree with radiology impression as follows:  CXR:   10/15/18 Stable right lung scarring is noted with  small right pleural Effusion.  Labs ordered/ reviewed:      Chemistry      Component Value Date/Time   NA 137 10/22/2018 1556   K 4.5 10/22/2018 1556   CL 97 10/22/2018 1556   CO2 33 (H) 10/22/2018 1556   BUN 16 10/22/2018 1556   CREATININE 1.11 10/22/2018 1556      Component Value Date/Time   CALCIUM 9.4 10/22/2018 1556   ALKPHOS 82 10/13/2018 1726   AST 103 (H) 10/13/2018 1726   ALT 250 (H) 10/13/2018 1726   BILITOT 0.8 10/13/2018 1726        Lab Results  Component Value Date   WBC 9.8 10/22/2018   HGB 13.3 10/22/2018   HCT 39.3 10/22/2018   MCV 96.1 10/22/2018   PLT 158.0 10/22/2018       EOS                                                               0.0                                    10/22/2018             Assessment & Plan:

## 2018-10-22 NOTE — Patient Instructions (Addendum)
Plan A = Automatic = symbicort 160 Take 2 puffs first thing in am chase with Spiriva  and then another 2 puffs about 12 hours later    Plan B = Backup Only use your albuterol inhaler as a rescue medication to be used if you can't catch your breath by resting or doing a relaxed purse lip breathing pattern.  - The less you use it, the better it will work when you need it. - Ok to use the inhaler up to 2 puffs  every 4 hours if you must but call for appointment if use goes up over your usual need - Don't leave home without it !!  (think of it like the spare tire for your car)   Plan C = Crisis - only use your albuterol nebulizer if you first try Plan B and it fails to help > ok to use the nebulizer up to every 4 hours but if start needing it regularly call for immediate appointment   Continue to taper off the prednisone     Continue Prilosec 40 mg Take 30- 60 min before your first and last meals of the day   GERD (REFLUX)  is an extremely common cause of respiratory symptoms just like yours , many times with no obvious heartburn at all.    It can be treated with medication, but also with lifestyle changes including elevation of the head of your bed (ideally with 6-8 inch  bed blocks),  Smoking cessation, avoidance of late meals, excessive alcohol, and avoid fatty foods, chocolate, peppermint, colas, red wine, and acidic juices such as orange juice.  NO MINT OR MENTHOL PRODUCTS SO NO COUGH DROPS   USE SUGARLESS CANDY INSTEAD (Jolley ranchers or Stover's or Life Savers) or even ice chips will also do - the key is to swallow to prevent all throat clearing. NO OIL BASED VITAMINS - use powdered substitutes.  .Please remember to go to the lab department   for your tests - we will call you with the results when they are available.      Please schedule a follow up office visit in 6 weeks, call sooner if needed  Add: needs alpha one screen next ov

## 2018-10-22 NOTE — Progress Notes (Signed)
Patient completed full PFT today. 

## 2018-10-23 ENCOUNTER — Encounter: Payer: Self-pay | Admitting: Internal Medicine

## 2018-10-23 DIAGNOSIS — J449 Chronic obstructive pulmonary disease, unspecified: Secondary | ICD-10-CM | POA: Insufficient documentation

## 2018-10-23 DIAGNOSIS — J4489 Other specified chronic obstructive pulmonary disease: Secondary | ICD-10-CM | POA: Insufficient documentation

## 2018-10-23 NOTE — Assessment & Plan Note (Signed)
Quit smoking 03/2017 -  Spirometry 05/29/2017  FEV1 1.37 (39%)  Ratio 47 p am symb x2 and spirva x one and abn effort dep portion  - 05/29/2017   > rec  change  spiriva to  respimat and symb 160 2bid s spacer   - PFT's  07/30/2017  FEV1 1.39 (38 % ) ratio 48  p 23 % improvement from saba p nothing prior to study with DLCO  39/42 % corrects to 59 % for alv volume    - PFT's  10/22/2018  FEV1 1.26 (35 % ) ratio 44  p 14 % improvement from saba p symb x2  prior to study with DLCO  37/43c % corrects to 67 % for alv volume  - 10/22/2018  After extensive coaching inhaler device,  effectiveness =    75% (short Ti)     Clearly the last admit was not a typical "aecopd" flare (more likely chf)  and now that off cigs should be able to wean off prednisone and maint on lama/laba with ICS as well until next ov and then look to possibly just using lama/laba if stable off pred x 3 months   I had an extended discussion with the patient and wifereviewing all relevant studies (including hosp records) completed to date and  lasting 15 to 20 minutes of a 25 minute visit    See device teaching which extended face to face time for this visit.  Each maintenance medication was reviewed in detail including emphasizing most importantly the difference between maintenance and prns and under what circumstances the prns are to be triggered using an action plan format that is not reflected in the computer generated alphabetically organized AVS which I have not found useful in most complex patients, especially with respiratory illnesses  Please see AVS for specific instructions unique to this visit that I personally wrote and verbalized to the the pt in detail and then reviewed with pt  by my nurse highlighting any  changes in therapy recommended at today's visit to their plan of care.

## 2018-10-23 NOTE — Assessment & Plan Note (Signed)
Echo 10/07/18 Left ventricle: The cavity size was normal. There was mild   concentric hypertrophy. Systolic function was normal. The   estimated ejection fraction was in the range of 60% to 65%. Wall   motion was normal; there were no regional wall motion   abnormalities. Doppler parameters are consistent with abnormal   left ventricular relaxation (grade 1 diastolic dysfunction).   There was no evidence of elevated ventricular filling pressure by   Doppler parameters. - Aortic valve: Trileaflet; mildly thickened leaflets. There was   moderate regurgitation. - Mitral valve: There was mild regurgitation. - Left atrium: The atrium was normal in size. - Right ventricle: Systolic function was normal. - Tricuspid valve: There was mild regurgitation. - Pulmonic valve: There was no regurgitation. - Pulmonary arteries: Systolic pressure was within the normal   range. - Inferior vena cava: The vessel was normal in size. - Pericardium, extracardiac: There was no pericardial effusion.    DDX of  difficult airways management almost all start with A and  include Adherence, Ace Inhibitors, Acid Reflux, Active Sinus Disease, Alpha 1 Antitripsin deficiency, Anxiety masquerading as Airways dz,  ABPA,  Allergy(esp in young), Aspiration (esp in elderly), Adverse effects of meds,  Active smoking or vaping, A bunch of PE's (a small clot burden can't cause this syndrome unless there is already severe underlying pulm or vascular dz with poor reserve) plus two Bs  = Bronchiectasis and Beta blocker use..and one C= CHF   Adherence is always the initial "prime suspect" and is a multilayered concern that requires a "trust but verify" approach in every patient - starting with knowing how to use medications, especially inhalers, correctly, keeping up with refills and understanding the fundamental difference between maintenance and prns vs those medications only taken for a very short course and then stopped and not  refilled.  - see hfa teaching - return with all meds in hand using a trust but verify approach to confirm accurate Medication  Reconciliation The principal here is that until we are certain that the  patients are doing what we've asked, it makes no sense to ask them to do more.   ? Acid (or non-acid) GERD > always difficult to exclude as up to 75% of pts in some series report no assoc GI/ Heartburn symptoms> rec continue max (24h)  acid suppression and diet restrictions/ reviewed  ? Allergy/ asthma > taper off prednisone and see if flares   ? Alpha one AT def > screen next ov      ? chf > valvular heart dz exac by high bp likely cause of chf episode, mainly needs to control bp, consider adding afterload reducer in this setting like an ARB but defer to PCP rx including timing for referral to cards

## 2018-11-05 ENCOUNTER — Other Ambulatory Visit: Payer: Self-pay | Admitting: Adult Health

## 2018-11-08 DIAGNOSIS — Z6826 Body mass index (BMI) 26.0-26.9, adult: Secondary | ICD-10-CM | POA: Diagnosis not present

## 2018-11-08 DIAGNOSIS — M25552 Pain in left hip: Secondary | ICD-10-CM | POA: Diagnosis not present

## 2018-11-10 ENCOUNTER — Encounter: Payer: Self-pay | Admitting: Adult Health

## 2018-11-10 ENCOUNTER — Ambulatory Visit (INDEPENDENT_AMBULATORY_CARE_PROVIDER_SITE_OTHER): Payer: Medicare Other | Admitting: Adult Health

## 2018-11-10 VITALS — BP 120/50 | Temp 98.2°F | Wt 187.0 lb

## 2018-11-10 DIAGNOSIS — I509 Heart failure, unspecified: Secondary | ICD-10-CM | POA: Diagnosis not present

## 2018-11-10 DIAGNOSIS — R252 Cramp and spasm: Secondary | ICD-10-CM

## 2018-11-10 DIAGNOSIS — F339 Major depressive disorder, recurrent, unspecified: Secondary | ICD-10-CM

## 2018-11-10 DIAGNOSIS — J449 Chronic obstructive pulmonary disease, unspecified: Secondary | ICD-10-CM

## 2018-11-10 DIAGNOSIS — B356 Tinea cruris: Secondary | ICD-10-CM | POA: Diagnosis not present

## 2018-11-10 LAB — BASIC METABOLIC PANEL
BUN: 14 mg/dL (ref 6–23)
CO2: 30 mEq/L (ref 19–32)
Calcium: 9.6 mg/dL (ref 8.4–10.5)
Chloride: 98 mEq/L (ref 96–112)
Creatinine, Ser: 1.18 mg/dL (ref 0.40–1.50)
GFR: 66.01 mL/min (ref >60.00)
GLUCOSE: 88 mg/dL (ref 70–99)
Potassium: 3.9 mEq/L (ref 3.5–5.1)
SODIUM: 138 meq/L (ref 135–145)

## 2018-11-10 LAB — MAGNESIUM: Magnesium: 1.6 mg/dL (ref 1.5–2.5)

## 2018-11-10 MED ORDER — NYSTATIN 100000 UNIT/GM EX POWD: Freq: Four times a day (QID) | CUTANEOUS | 1 refills | Status: DC

## 2018-11-10 MED ORDER — CITALOPRAM HYDROBROMIDE 10 MG PO TABS: 10.0000 mg | ORAL_TABLET | Freq: Every day | ORAL | 1 refills | Status: DC

## 2018-11-10 NOTE — Progress Notes (Signed)
Subjective:    Patient ID: Chase Lloyd, male    DOB: May 21, 1956, 63 y.o.   MRN: [id number]  HPI 63 year old male who  has a past medical history of Back pain, COPD (chronic obstructive pulmonary disease) ([place]), DDD (degenerative disc disease), lumbar, Depression, Emphysema of lung ([place]), Empyema ([place]), GERD (gastroesophageal reflux disease), Hearing loss of right ear, Hodgkin's disease ([place]) (1999), Hypotension, Meniere's disease, Pneumonia, and Tinnitus.   He presents to the office today for follow-up after hospital admission from 12/4 to 10/12/2018.  He has been seen by pulmonary for his TCM visit prior to this appointment.   He presented to the emergency room for evaluation of progressive shortness of breath and cough despite completing 2 courses of antibiotic and prednisone over the month prior.  He underwent a lung cancer screening CT on 10/04/2018 which showed possible pneumonia versus atelectasis versus neoplasm, as well as a left pleural effusion and suspected interstitial edema.  Pulmonary contacted him and directed him to the ED for evaluation of this issue.  Arrival to the ED the patient was afebrile, saturating mid 90s on room air, slightly tachycardia P neck and mildly tachycardic.  His blood pressure was 205/87.  EKG showed sinus tachycardia with a rate of 107, right axis deviation, nonspecific ST abnormality that of appeared similar to prior.  CTA chest was performed and it was negative for PE but notable for small bilateral pleural effusions and advanced emphysema.  Patient was treated with labetalol with improvement in blood pressures and was admitted for further evaluation and management.   Hospital Course  1.  Acute Hypoxic respiratory failure -Secondary to COPD exacerbation small pleural effusion, he qualified for oxygen as his O2 saturation dropped to 87% on ambulation  2.  COPD with acute exacerbation-Severe emphysema -Warted progressive shortness of breath and cough  despite completing 2 courses of antibiotics and prednisone.  Found to be tachycardic with slight elevation in troponin and CTA was performed, which was negative for PE or other acute findings. -Was on IV steroids during hospital stay, wheezing has significantly improved and he was discharged on a prednisone taper, as well to finish 4 days of oral azithromycin for his productive cough  3. Small Pleural Effusion  -Status post drainage in IR, total of 350 cc were removed.  Significant for exudative effusion but Gram stain was negative.  Cultures remain negative. -Courage to use incentive spirometry -Halogen with no evidence of malignant cells  4. Acute on Chronic diastolic HF -2D echo with preserved EF, but grade 1 diastolic dysfunction, has lower extremity edema that improved with diuresis, was discharged on low-dose Lasix   Echo 10/07/18 Left ventricle: The cavity size was normal. There was mild concentric hypertrophy. Systolic function was normal. The estimated ejection fraction was in the range of 60% to 65%. Wall motion was normal; there were no regional wall motion abnormalities. Doppler parameters are consistent with abnormal left ventricular relaxation (grade 1 diastolic dysfunction). There was no evidence of elevated ventricular filling pressure by Doppler parameters. - Aortic valve: Trileaflet; mildly thickened leaflets. There was moderate regurgitation. - Mitral valve: There was mild regurgitation. - Left atrium: The atrium was normal in size. - Right ventricle: Systolic function was normal. - Tricuspid valve: There was mild regurgitation. - Pulmonic valve: There was no regurgitation. - Pulmonary arteries: Systolic pressure was within the normal range. - Inferior vena cava: The vessel was normal in size. - Pericardium, extracardiac: There was no pericardial effusion  [facility]  Discharge   He was seen by pulmonary, Dr. Shyrl Numbers on 10/22/2018 for hospital  follow-up.  Currently prescribed Symbicort 160 mg, take 2 puffs in the morning followed by Spiriva and another 2 puffs about 12 hours later.  He is advised to only use his albuterol inhaler as a rescue medication and only to use albuterol nebulizer if albuterol inhaler fails to help.  Peers as though he is going to start pulmonary rehab at the end of this month.    There was a question of congestive heart failure versus valvular heart disease that was exacerbated by high blood pressure as a cause of his CHF episode.    Today in the office he reports that he feels as though he is breathing slightly better but continues to become short of breath with minimal exertion.  Is in his inhalers as directed and is excited to start pulmonary rehab to see if this helps with breathing issues.  He has noticed that his lower extremity edema has resolved since he started Lasix unfortunately has muscle cramps in his head for a period of time have become worse.  He reports that they often wake him up in the middle the night and cause severe pain.  BC and BMP were done by pulmonary and were essentially within normal limits.  He also complains of depression is interested in starting something this.  His depression stems from not being able to do things that he wants enjoyed due to becoming so short of breath easily.  He denies suicidal ideation.  Reports that oftentimes he does not want to get out of the bed in the morning and will often either sleep all day or take naps throughout the afternoon.  Additionally he complains of fungal infection in his groin.  He has been applying many over-the-counter sprays and ointments but this has not resolved.  Complains of itching and burning.   Review of Systems See HPI   Past Medical History:  Diagnosis Date  . Back pain   . COPD (chronic obstructive pulmonary disease) ([place])   . DDD (degenerative disc disease), lumbar   . Depression   . Emphysema of lung ([place])   . Empyema  ([place])    2016  (was in [place])  . GERD (gastroesophageal reflux disease)   . Hearing loss of right ear    started 2001.     microphone in right ear ,  and hearing aid in left  . Hodgkin's disease ([place]) 1999  . Hypotension   . Meniere's disease   . Pneumonia   . Tinnitus     Social History   Socioeconomic History  . Marital status: Married    Spouse name: Not on file  . Number of children: Not on file  . Years of education: Not on file  . Highest education level: Not on file  Occupational History  . Occupation: Retired  Scientific laboratory technician  . Financial resource strain: Not on file  . Food insecurity:    Worry: Not on file    Inability: Not on file  . Transportation needs:    Medical: Not on file    Non-medical: Not on file  Tobacco Use  . Smoking status: Former Smoker    Packs/day: 1.50    Years: 47.00    Pack years: 70.50    Types: Cigarettes    Last attempt to quit: 06/13/2018    Years since quitting: 0.4  . Smokeless tobacco: Never Used  Substance and Sexual Activity  .  Alcohol use: Yes    Comment: 24 cans in a week  . Drug use: No  . Sexual activity: Not on file  Lifestyle  . Physical activity:    Days per week: Not on file    Minutes per session: Not on file  . Stress: Not on file  Relationships  . Social connections:    Talks on phone: Not on file    Gets together: Not on file    Attends religious service: Not on file    Active member of club or organization: Not on file    Attends meetings of clubs or organizations: Not on file    Relationship status: Not on file  . Intimate partner violence:    Fear of current or ex partner: Not on file    Emotionally abused: Not on file    Physically abused: Not on file    Forced sexual activity: Not on file  Other Topics Concern  . Not on file  Social History Narrative   Retied - worked as Furniture conservator/restorer        Past Surgical History:  Procedure Laterality Date  . BACK SURGERY    . CERVICAL FUSION  2016  . ELBOW  SURGERY    . EYE SURGERY    . IR THORACENTESIS ASP PLEURAL SPACE W/IMG GUIDE  10/08/2018  . LAMINECTOMY  1999  . lower back surgery    . TONSILLECTOMY      Family History  Problem Relation Age of Onset  . Hypertension Father   . Diabetes Father   . Testicular cancer Brother   . Melanoma Brother     No Known Allergies  Current Outpatient Medications on File Prior to Visit  Medication Sig Dispense Refill  . Albuterol Sulfate 108 (90 Base) MCG/ACT AEPB Inhale 1-2 puffs into the lungs every 6 (six) hours as needed.    . budesonide-formoterol (SYMBICORT) 160-4.5 MCG/ACT inhaler Inhale 2 puffs into the lungs 2 (two) times daily. 1 Inhaler 11  . furosemide (LASIX) 20 MG tablet Take 1 tablet (20 mg total) by mouth daily. 30 tablet 11  . ibuprofen (ADVIL,MOTRIN) 800 MG tablet TAKE 1 TABLET BY MOUTH EVERY 8 HOURS AS NEEDED 180 tablet 1  . omeprazole (PRILOSEC) 40 MG capsule Take 30- 60 min before your first and last meals of the day 180 capsule 3  . predniSONE (DELTASONE) 10 MG tablet Prednisone taper: 50mg  x 3 days 40 mg x 3 days 30 mg x 3 days 20 mg x 3 days 10 mg x 3 days 45 tablet 0  . SYMBICORT 160-4.5 MCG/ACT inhaler Inhale 2 puffs into the lungs 2 (two) times daily. 1 Inhaler 0  . Tiotropium Bromide Monohydrate (SPIRIVA RESPIMAT) 2.5 MCG/ACT AERS Inhale 2 puffs into the lungs daily. 1 Inhaler 2   No current facility-administered medications on file prior to visit.     BP (!) 120/50   Temp 98.2 F (36.8 C)   Wt 187 lb (84.8 kg)   BMI 26.08 kg/m       Objective:   Physical Exam Vitals signs and nursing note reviewed.  Constitutional:      Appearance: Normal appearance.  Cardiovascular:     Rate and Rhythm: Normal rate and regular rhythm.     Pulses: Normal pulses.     Heart sounds: Normal heart sounds.  Pulmonary:     Effort: Pulmonary effort is normal.     Breath sounds: No stridor. Decreased breath sounds present. No wheezing, rhonchi or  rales.  Musculoskeletal:  Normal range of motion.  Skin:    General: Skin is warm and dry.     Findings: Rash (bilateral groin folds) present.  Neurological:     General: No focal deficit present.     Mental Status: He is alert. Mental status is at baseline. He is disoriented.  Psychiatric:        Mood and Affect: Mood normal.        Behavior: Behavior normal.        Thought Content: Thought content normal.        Judgment: Judgment normal.       Assessment & Plan:  1. COPD GOLD III  -Continue with pulmonary plan of care  2. Depression, recurrent ([place]) PHQ 9 score = 14 -We will start off on low-dose Celexa, reviewed side effects with the patient and his wife.  We will have him follow-up in 1 month - citalopram (CELEXA) 10 MG tablet; Take 1 tablet (10 mg total) by mouth daily.  Dispense: 30 tablet; Refill: 1  3. Muscle cramping -Cramping has become worse since starting Lasix.  Will recheck BMP and magnesium.  Follow-up with patient once labs have resulted - Basic Metabolic Panel - Magnesium  4. Acute congestive heart failure, unspecified heart failure type ([place]) -I think it is reasonable with someone in his condition to refer to cardiology for further evaluation of CHF. - Basic Metabolic Panel - Magnesium  5. Jock itch  - nystatin (NYSTATIN) powder; Apply topically 4 (four) times daily.  Dispense: 45 g; Refill: 1  [name], NP

## 2018-11-11 ENCOUNTER — Other Ambulatory Visit: Payer: Self-pay | Admitting: Adult Health

## 2018-11-11 MED ORDER — GABAPENTIN 300 MG PO CAPS: 300.0000 mg | ORAL_CAPSULE | Freq: Two times a day (BID) | ORAL | 0 refills | Status: DC

## 2018-12-03 ENCOUNTER — Telehealth: Payer: Self-pay | Admitting: Adult Health

## 2018-12-03 ENCOUNTER — Ambulatory Visit (INDEPENDENT_AMBULATORY_CARE_PROVIDER_SITE_OTHER): Payer: Medicare Other | Admitting: Internal Medicine

## 2018-12-03 ENCOUNTER — Encounter: Payer: Self-pay | Admitting: Internal Medicine

## 2018-12-03 ENCOUNTER — Ambulatory Visit: Payer: Medicare Other | Admitting: Internal Medicine

## 2018-12-03 VITALS — BP 130/50 | HR 83 | Ht 71.0 in | Wt 189.4 lb

## 2018-12-03 DIAGNOSIS — J9611 Chronic respiratory failure with hypoxia: Secondary | ICD-10-CM | POA: Diagnosis not present

## 2018-12-03 DIAGNOSIS — J449 Chronic obstructive pulmonary disease, unspecified: Secondary | ICD-10-CM

## 2018-12-03 NOTE — Patient Instructions (Signed)
Please remember to go to the lab department   for your tests - we will call you with the results when they are available.      Please schedule a follow up visit in 3 months but call sooner if needed

## 2018-12-03 NOTE — Progress Notes (Signed)
Subjective:   Patient ID: Chase Lloyd, male    DOB: 04/19/1956,     MRN: [id number]      Brief patient profile:  12 yowm quit smoking 06/2018  from  [facility]  first aware of doe 2013 dx as copd rx symbicort helped a lot then pna/ sepsis Feb 2017 and added spiriva and rx 02 short term and moved to [place] may 2018 and referred to pulmonary clinic 05/29/2017 by [name] with GOLD III criteria at initial eval    History of Present Illness  05/29/2017 1st [facility] office visit/ [name]  Maint rx symb 160 2bid / spiriva dpi  Chief Complaint  Patient presents with  . Pulmonary Consult    Pt referred by Dr. [name] for COPD. Pt c/o DOE, prod cough with yellow mucus. Pt denies CP/tightness and f/c/s. Pt states he rarely uses his albuterol hfa.   ok at rest, sleeps fine with improving cough since quit smoking  Left leg swelling x months assoc with hip pain > ortho following with 'neg sonogram"  per pt  rec Plan A = Automatic = Symbicort 160 Take 2 puffs first thing in am and then another 2 puffs about 12 hours later and spiriva 2 puffs in am only (ok to use up the powder form if you want)  Work on inhaler technique:  relax and gently blow all the way out then take a nice smooth deep breath back in, triggering the inhaler at same time you start breathing in.  Hold for up to 5 seconds if you can. Blow out thru nose. Rinse and gargle with water when done Plan B = Backup Only use your albuterol as a rescue medication   07/30/2017  f/u ov/[name] re:  GOLD III with reversibility / symb 160/spiriva smi Chief Complaint  Patient presents with  . Follow-up    PFT's done today. His breathing is doing well. He has not had to use his rescue inhaler. No new co's today.   Not limited by breathing from desired activities but  Due to back and hip and leg issues  Sleeping fine/ no noct saba or am flares rec Work on maintaining perfect inhaler technique:  relax and gently blow all the way out then take  a nice smooth deep breath back in, triggering the inhaler at same time you start breathing in.    04/28/2018 acute extended ov/[name] re:  GOLD III/  Chief Complaint  Patient presents with  . Acute Visit    increased SOB x 2 months. He also c/o occ cough- prod at times with yellow sputum. He states he was just told by PCP today that he had bronchitis based on recent cxr 04/26/18. He is using his albuterol inhaler about every other day.   dyspnea gradually worse x 2 months to point = .MMRC2 = can't walk a nl pace on a flat grade s sob but does fine slow and flat but also limited back and L hip pain 02 none  saba qod  Dysphagia x years on prilosec q am but not ac  Off spiriva x months not convinced made any difference Already on pred/doxy per PCP since 04/26/18  rec Try taking omeprazole Take 30-60 min before first meal of the day  GERD diet   Plan A = Automatic = Symbicort  160 Take 2 puffs first thing in am and then another 2 puffs about 12 hours later.  Plan B = Backup Only use your albuterol  as a rescue medication   Finish the prednisone and antibiotic arleady prescribed  The key is to stop smoking completely before smoking completely stops you!  Please schedule a follow up visit in 3 months but call sooner if needed pft's > not done       09/10/18 NP eval/ aecopd aecopd x one week rec Continue Symbicort 160  Restart Spiriva Respimat 2.5 Continue prednisone Continue doxycycline DuoNeb nebulized medications prescribed today >>> If using more than 1-2 times a day please contact our office    09/21/2018 extended f/u  ov/[name] re:  GOLD III/ group D with refractory sob  Chief Complaint  Patient presents with  . Follow-up    Breathing is worse x 2 days. He is winded just walking from room to room at home. He is not sleeping well. He is having a right temporal HA.  He is coughing with yellow sputum.    Dyspnea:  Room to room, neb helps the most  Cough: thick yellow  Esp in am    Sleeping: poorly due to sob one pillow either side  SABA use: both hfa and neb now but uses grandson's neb / hfa poor  02: no 02  rec Plan A = Automatic = symbicort 160 Take 2 puffs first thing in am and then another 2 puffs about 12 hours later ok to use spirviva in evening = 1.25 (blue top) x 4 or 2.5 (green) x 2  Plan B = Backup Only use your albuterol inhaler as a rescue medication Plan C = Crisis - only use your albuterol nebulizer if you first try Plan B and it fails to help > ok to use the nebulizer up to every 4 hours but if start needing it regularly call for immediate appointment Plan D = Doctor - call me if B and C not adequate Plan E = ER - go to ER or call 911 if all else fails   Prednisone  10 mg Take 4 for three days 3 for three days 2 for three days 1 for three days and stop  Levaquin 500 mg one daily x 7 days - stop if aches in tendons  While coughing / wheezing > prilosec 40 mg Take 30- 60 min before your first and last meals of the day  GERD   Please schedule a follow up office visit in 4 weeks     Admission date:  10/06/2018   Discharge Date:  10/12/2018    Recommendations for primary care physician for things to follow:  -Please check CBC, BMP to ensure stable renal function after initiation of diuresis  Admission Diagnosis  Hypertensive emergency [I16.1] Dyspnea, unspecified type [R06.00]   Discharge Diagnosis  Hypertensive emergency [I16.1] Dyspnea, unspecified type [R06.00]   Principal Problem:   COPD with acute exacerbation ([place]) Active Problems:   Acute respiratory distress   Hypertensive urgency   Troponin I above reference range   Dyspnea                    ZOX:WRUEAV L Chase Lloyd a 63 y.o.malewith medical history significant foremphysema, tobacco abuse in remission, and degenerative disc disease, now presenting to the emergency department for evaluation of progressive shortness of breath and cough despite completing 2 courses  of antibiotics and prednisone over the past 1 month. Patient reports that his chronic dyspnea and cough had worsened a little over a month ago, he was seen by his PCP at that time, started on doxycycline and prednisone,  failed to have any significant improvement, and then followed up with his pulmonologist about 2 weeks ago. Pulmonologist started Levaquin and prednisone, but he continued to have increased shortness of breath and cough. There has not been any chest pain, fevers, chills, or leg swelling or tenderness associated with this. He underwent a lung cancer screening CT on 10/04/2018 with possible pneumonia versus atelectasis versus neoplasm, as well as left pleural effusion and suspected interstitial edema. He was contacted by his pulmonology clinic and directed to the ED for evaluation of this.   ED Course:Upon arrival to the ED, patient is found to be afebrile, saturating mid 90s on room air, slightly tachypneic, mildly tachycardic, and with blood pressure 205/87. EKG features a sinus tachycardia with rate 107, right axis deviation, and nonspecific ST-T abnormality that appears similar to prior. CTA chest was performed and is negative for PE, but notable for small bilateral pleural effusions and advanced emphysema. Blood cultures were collected in the ED and the patient was treated with labetalol with improvement in his blood pressure. Hospitalist were asked to admit for further evaluation and management.  Hospital Course   63 y.o.malewith medical history significant foremphysema, tobacco abuse in remission, and degenerative disc disease, now presenting to the emergency department for evaluation of progressive shortness of breath and cough despite completing 2 courses of antibiotics and prednisone over the past 1 month. Work-up significant for pleural effusion, small, and COPD exacerbation.  Acute Hypoxic respiratory failure -Secondary to COPD exacerbation, and small pleural  effusion, he qualified for oxygen as dropped to 87% on ambulation   COPD with acute exacerbation- severe emphysema -Reports progressive SOB and cough despite completing 2 courses of antibiotics and prednisone -Found to be tachycardic with slight elevation in troponin and CTA was performed, negative for PE or other acute findings - echo: normal EF, grade 1 diastolic, normal wall motion -Was on IV steroids during hospital stay, wheezing has significantly improved, he will be discharged on prednisone taper, as well to finish another 4 days of oral azithromycin given his productive cough .  Small pleural effusion -s/p drainage in IR, total of 350 cc were removed, Significant for exudative effusion as LDH ratio is 0.68, but so far Gram stain is negative, cultures remains negative -Encouraged to use incentive spirometry -Collagen with no evidence of malignant cells  Acute on chronic diastolic CHF -2D echo with a preserved EF, but grade 1 diastolic dysfunction, has lower extremity edema, improving with diuresis,w will be discharged on low dose Lasix  AKI -Creatinine peaked at 1.3 today, secondary to diuresis, improved with holding diuretics, he will be discharged on low-dose LasixHypertensive urgency -Blood pressure elevated on admission, most likely in the setting of respiratory distress, currently blood pressure controlled without any medications  Troponin elevation -CE negative      10/22/2018  f/u ov/Erin Obando re:  GOLD III / still on 30 mg pred / no longer smoking at all maint on symb/spiriva Chief Complaint  Patient presents with  . Follow-up    Pt has some SOB with exertion, productive cough-yellow, and some wheezing in last week. He rarely uses his albuterol inhaler. He uses his neb 3 x daily.   Dyspnea:  MMRC3 = can't walk 100 yards even at a slow pace at a flat grade s stopping due to sob   Cough:  Better / some am congestion  Sleeping: bed is flat, sleeps on  side SABA use: albuterol neb w/in 15 min of rising daily  02: 2lpm hs  and some daytime  rec Plan A = Automatic = symbicort 160 Take 2 puffs first thing in am chase with Spiriva  and then another 2 puffs about 12 hours later  Plan B = Backup Only use your albuterol inhaler  Plan C = Crisis - only use your albuterol nebulizer Continue to taper off the prednisone  Continue Prilosec 40 mg Take 30- 60 min before your first and last meals of the day  GERD  Diet         12/03/2018  f/u ov/Ralph Benavidez re:   GOLD III/ maint sym/spiriva/ off prednisone/  Chief Complaint  Patient presents with  . Follow-up    Breathing has improved some. He uses his albuterol inhaler about once per wk.   Dyspnea: hip stopping first  Cough: some nasal/ thraot congestion Sleeping: bed is flat  SABA use: rarely 02: 2lpm hs    No obvious day to day or daytime variability or assoc excess/ purulent sputum or mucus plugs or hemoptysis or cp or chest tightness, subjective wheeze or overt sinus or hb symptoms.   Sleeping as above  without nocturnal  or early am exacerbation  of respiratory  c/o's or need for noct saba. Also denies any obvious fluctuation of symptoms with weather or environmental changes or other aggravating or alleviating factors except as outlined above   No unusual exposure hx or h/o childhood pna/ asthma or knowledge of premature birth.  Current Allergies, Complete Past Medical History, Past Surgical History, Family History, and Social History were reviewed in [employer] record.  ROS  The following are not active complaints unless bolded Hoarseness, sore throat, dysphagia, dental problems, itching, sneezing,  nasal congestion or discharge of excess mucus or purulent secretions, ear ache,   fever, chills, sweats, unintended wt loss or wt gain, classically pleuritic or exertional cp,  orthopnea pnd or arm/hand swelling  or leg swelling, presyncope, palpitations, abdominal pain,  anorexia, nausea, vomiting, diarrhea  or change in bowel habits or change in bladder habits, change in stools or change in urine, dysuria, hematuria,  rash, arthralgias, visual complaints, headache, numbness, weakness or ataxia or problems with walking or coordination,  change in mood or  memory.        Current Meds  Medication Sig  . Albuterol Sulfate 108 (90 Base) MCG/ACT AEPB Inhale 1-2 puffs into the lungs every 6 (six) hours as needed.  . budesonide-formoterol (SYMBICORT) 160-4.5 MCG/ACT inhaler Inhale 2 puffs into the lungs 2 (two) times daily.  . citalopram (CELEXA) 10 MG tablet Take 1 tablet (10 mg total) by mouth daily.  . furosemide (LASIX) 20 MG tablet Take 1 tablet (20 mg total) by mouth daily.  Marland Kitchen gabapentin (NEURONTIN) 300 MG capsule Take 1 capsule (300 mg total) by mouth 2 (two) times daily.  Marland Kitchen ibuprofen (ADVIL,MOTRIN) 800 MG tablet TAKE 1 TABLET BY MOUTH EVERY 8 HOURS AS NEEDED  . omeprazole (PRILOSEC) 40 MG capsule Take 30- 60 min before your first and last meals of the day  . SYMBICORT 160-4.5 MCG/ACT inhaler Inhale 2 puffs into the lungs 2 (two) times daily.  . Tiotropium Bromide Monohydrate (SPIRIVA RESPIMAT) 2.5 MCG/ACT AERS Inhale 2 puffs into the lungs daily.           Objective:   Physical Exam  amb wm nad    12/03/2018          189 10/22/2018      184 09/21/2018      179  04/28/2018  173  07/30/2017        173  05/29/17 168 lb (76.2 kg)  05/01/17 164 lb (74.4 kg)  04/15/17 162 lb (73.5 kg)     Vital signs reviewed - Note on arrival 02 sats  97% on RA       HEENT: full dentures/  nl oropharynx. Nl external ear canals without cough reflex -  Mild bilateral non-specific turbinate edema     NECK :  without JVD/Nodes/TM/ nl carotid upstrokes bilaterally   LUNGS: no acc muscle use,  Mod barrel  contour chest wall with bilateral  Distant bs s audible wheeze and  without cough on insp or exp maneuver and mod  Hyperresonant  to  percussion bilaterally      CV:  RRR  no s3 or murmur or increase in P2, and no edema   ABD:  soft and nontender with pos mod insp Hoover's  in the supine position. No bruits or organomegaly appreciated, bowel sounds nl  MS:    ext warm without deformities, calf tenderness, cyanosis or clubbing No obvious joint restrictions   SKIN: warm and dry without lesions    NEURO:  alert, approp, nl sensorium with  no motor or cerebellar deficits apparent.          Labs ordered 12/03/2018  Alpha one screen     Chemistry      Component Value Date/Time   NA 138 11/10/2018 1453   K 3.9 11/10/2018 1453   CL 98 11/10/2018 1453   CO2 30 11/10/2018 1453   BUN 14 11/10/2018 1453   CREATININE 1.18 11/10/2018 1453      Component Value Date/Time   CALCIUM 9.6 11/10/2018 1453   ALKPHOS 82 10/13/2018 1726   AST 103 (H) 10/13/2018 1726   ALT 250 (H) 10/13/2018 1726   BILITOT 0.8 10/13/2018 1726              Assessment & Plan:

## 2018-12-03 NOTE — Telephone Encounter (Signed)
Copied from [place] [id number]. Topic: Quick Communication - See Telephone Encounter >> Dec 03, 2018  4:51 PM Vernona Rieger wrote: CRM for notification. See Telephone encounter for: 12/03/18.  Patient states that he needs a higher dose of citalopram (CELEXA) 10 MG tablet. He said it is not effective. Please advise.

## 2018-12-04 ENCOUNTER — Encounter: Payer: Self-pay | Admitting: Internal Medicine

## 2018-12-04 DIAGNOSIS — J9611 Chronic respiratory failure with hypoxia: Secondary | ICD-10-CM | POA: Insufficient documentation

## 2018-12-04 NOTE — Assessment & Plan Note (Signed)
Using 02 2lpm hs only as of 12/03/2018 / last hc03 normalized at 30 so hypercarbia improved/ resolved.  No change rx for now - he is slowed more by hip than breathing so no need for ambulatory 02 eval at this point    I had an extended discussion with the patient reviewing all relevant studies completed to date and  lasting 15 to 20 minutes of a 25 minute visit    See device teaching which extended face to face time for this visit.  Each maintenance medication was reviewed in detail including emphasizing most importantly the difference between maintenance and prns and under what circumstances the prns are to be triggered using an action plan format that is not reflected in the computer generated alphabetically organized AVS which I have not found useful in most complex patients, especially with respiratory illnesses  Please see AVS for specific instructions unique to this visit that I personally wrote and verbalized to the the pt in detail and then reviewed with pt  by my nurse highlighting any  changes in therapy recommended at today's visit to their plan of care.

## 2018-12-04 NOTE — Assessment & Plan Note (Signed)
Quit smoking 06/2018  -  Spirometry 05/29/2017  FEV1 1.37 (39%)  Ratio 47 p am symb x2 and spirva x one and abn effort dep portion  - 05/29/2017   > rec  change  spiriva to  respimat and symb 160 2bid s spacer   - PFT's  07/30/2017  FEV1 1.39 (38 % ) ratio 48  p 23 % improvement from saba p nothing prior to study with DLCO  39/42 % corrects to 59 % for alv volume  - PFT's  10/22/2018  FEV1 1.26 (35 % ) ratio 44  p 14 % improvement from saba p symb x2  prior to study with DLCO  37/43c % corrects to 67 % for alv volume   - 12/03/2018  After extensive coaching inhaler device,  effectiveness =    90%  - alpha one AT screen 12/03/2018   Group D in terms of symptom/risk and laba/lama/ICS  therefore appropriate rx at this point > continue symb 160/spiriva

## 2018-12-07 NOTE — Telephone Encounter (Signed)
Called and spoke to the pt.  Advised that he is due for 1 month follow up for Celexa.  Pt states he will need to speak to his son and check his schedule.  Will call back to schedule appointment.  Nothing further needed at this time.

## 2018-12-10 ENCOUNTER — Telehealth: Payer: Self-pay | Admitting: Internal Medicine

## 2018-12-10 DIAGNOSIS — J441 Chronic obstructive pulmonary disease with (acute) exacerbation: Secondary | ICD-10-CM

## 2018-12-10 LAB — ALPHA-1 ANTITRYPSIN PHENOTYPE: A-1 Antitrypsin, Ser: 155 mg/dL (ref 83–199)

## 2018-12-10 NOTE — Telephone Encounter (Signed)
That's fine - I re-ordered and he should only be charged once for this study

## 2018-12-10 NOTE — Telephone Encounter (Signed)
Called Patients Wife, Audrea Muscat.  Left message to call back.

## 2018-12-10 NOTE — Telephone Encounter (Signed)
Apologize to him but apparently not done yet and you will need to notify lab it is [facility] overdue and if they can't run it on what they have then we can do it at next ov and make sure he doesn't get charged twice

## 2018-12-10 NOTE — Telephone Encounter (Signed)
Patient returned call, CB is [phone].

## 2018-12-10 NOTE — Telephone Encounter (Signed)
Called and spoke with Chase Lloyd, [name].  She stated that someone else was working in her place that day.  Since the order was released to [facility] and not done, it will have to be redrawn.  Called and spoke with Patients Wife, Chase Lloyd. She requested to have lab redrawn, so they can have the results now, because next OV is 03/04/19.  She is also requesting no charge, because previous Alpha 1 antitrypsin phenotype was not done properly.  Message routed to Dr Melvyn Novas, for new alpha 1 antitrypsin phenotype order  Message routed to Kathlee Nations, to follow up on lab charge

## 2018-12-10 NOTE — Telephone Encounter (Signed)
Called patient, phone rang and was answered but got disconnected x2. Will call back.

## 2018-12-10 NOTE — Telephone Encounter (Signed)
Spoke with Audrea Muscat (Wife).  She wanted to know the results of the [facility] lab work.  Informed it was not resulted and would send a note to Dr. Melvyn Novas stating they were calling.   MW, please advise.   Please remember to go to the lab department   for your tests - we will call you with the results when they are available.      Please schedule a follow up visit in 3 months but call sooner if needed        After Visit Summary (Printed 12/03/2018)

## 2018-12-13 NOTE — Telephone Encounter (Signed)
ATC pt, the line rang and then disconnected. Will try back.

## 2018-12-13 NOTE — Progress Notes (Signed)
Spoke with pt and notified of results per Dr. Wert. Pt verbalized understanding and denied any questions. 

## 2018-12-13 NOTE — Telephone Encounter (Signed)
Confirmed the patient was not charged for an Alpha 1 Antitrypsin.

## 2018-12-14 ENCOUNTER — Encounter: Payer: Self-pay | Admitting: Adult Health

## 2018-12-14 ENCOUNTER — Ambulatory Visit (INDEPENDENT_AMBULATORY_CARE_PROVIDER_SITE_OTHER): Payer: Medicare Other | Admitting: Adult Health

## 2018-12-14 VITALS — BP 142/54 | Temp 98.4°F | Wt 189.0 lb

## 2018-12-14 DIAGNOSIS — F339 Major depressive disorder, recurrent, unspecified: Secondary | ICD-10-CM

## 2018-12-14 MED ORDER — CITALOPRAM HYDROBROMIDE 20 MG PO TABS: 20.0000 mg | ORAL_TABLET | Freq: Every day | ORAL | 3 refills | Status: DC

## 2018-12-14 NOTE — Telephone Encounter (Signed)
ATC pt, the line rang and then disconnected. Will try back.

## 2018-12-14 NOTE — Progress Notes (Signed)
Subjective:    Patient ID: Chase Lloyd, male    DOB: 1955/11/11, 63 y.o.   MRN: [id number]  HPI  63 year old male who presents to the office today for one month follow up regarding depression. In January he was started on Celexa 10 mg due to depression. Today he reports that he has not noticed any improvement in his symptoms and would like to increase his dose   Past Medical History:  Diagnosis Date  . Back pain   . COPD (chronic obstructive pulmonary disease) ([place])   . DDD (degenerative disc disease), lumbar   . Depression   . Emphysema of lung ([place])   . Empyema ([place])    2016  (was in [place])  . GERD (gastroesophageal reflux disease)   . Hearing loss of right ear    started 2001.     microphone in right ear ,  and hearing aid in left  . Hodgkin's disease ([place]) 1999  . Hypotension   . Meniere's disease   . Pneumonia   . Tinnitus     Review of Systems  Respiratory: Positive for shortness of breath.   Cardiovascular: Negative.   Skin: Negative.   Hematological: Negative.   Psychiatric/Behavioral: Positive for dysphoric mood.  All other systems reviewed and are negative.  Past Medical History:  Diagnosis Date  . Back pain   . COPD (chronic obstructive pulmonary disease) ([place])   . DDD (degenerative disc disease), lumbar   . Depression   . Emphysema of lung ([place])   . Empyema ([place])    2016  (was in [place])  . GERD (gastroesophageal reflux disease)   . Hearing loss of right ear    started 2001.     microphone in right ear ,  and hearing aid in left  . Hodgkin's disease ([place]) 1999  . Hypotension   . Meniere's disease   . Pneumonia   . Tinnitus     Social History   Socioeconomic History  . Marital status: Married    Spouse name: Not on file  . Number of children: Not on file  . Years of education: Not on file  . Highest education level: Not on file  Occupational History  . Occupation: Retired  Scientific laboratory technician  . Financial resource strain: Not on file  .  Food insecurity:    Worry: Not on file    Inability: Not on file  . Transportation needs:    Medical: Not on file    Non-medical: Not on file  Tobacco Use  . Smoking status: Former Smoker    Packs/day: 1.50    Years: 47.00    Pack years: 70.50    Types: Cigarettes    Last attempt to quit: 06/13/2018    Years since quitting: 0.5  . Smokeless tobacco: Never Used  Substance and Sexual Activity  . Alcohol use: Yes    Comment: 24 cans in a week  . Drug use: No  . Sexual activity: Not on file  Lifestyle  . Physical activity:    Days per week: Not on file    Minutes per session: Not on file  . Stress: Not on file  Relationships  . Social connections:    Talks on phone: Not on file    Gets together: Not on file    Attends religious service: Not on file    Active member of club or organization: Not on file    Attends meetings of clubs or  organizations: Not on file    Relationship status: Not on file  . Intimate partner violence:    Fear of current or ex partner: Not on file    Emotionally abused: Not on file    Physically abused: Not on file    Forced sexual activity: Not on file  Other Topics Concern  . Not on file  Social History Narrative   Retied - worked as Furniture conservator/restorer        Past Surgical History:  Procedure Laterality Date  . BACK SURGERY    . CERVICAL FUSION  2016  . ELBOW SURGERY    . EYE SURGERY    . IR THORACENTESIS ASP PLEURAL SPACE W/IMG GUIDE  10/08/2018  . LAMINECTOMY  1999  . lower back surgery    . TONSILLECTOMY      Family History  Problem Relation Age of Onset  . Hypertension Father   . Diabetes Father   . Testicular cancer Brother   . Melanoma Brother     No Known Allergies  Current Outpatient Medications on File Prior to Visit  Medication Sig Dispense Refill  . Albuterol Sulfate 108 (90 Base) MCG/ACT AEPB Inhale 1-2 puffs into the lungs every 6 (six) hours as needed.    . budesonide-formoterol (SYMBICORT) 160-4.5 MCG/ACT inhaler Inhale 2  puffs into the lungs 2 (two) times daily. 1 Inhaler 11  . furosemide (LASIX) 20 MG tablet Take 1 tablet (20 mg total) by mouth daily. 30 tablet 11  . gabapentin (NEURONTIN) 300 MG capsule Take 1 capsule (300 mg total) by mouth 2 (two) times daily. 180 capsule 0  . ibuprofen (ADVIL,MOTRIN) 800 MG tablet TAKE 1 TABLET BY MOUTH EVERY 8 HOURS AS NEEDED 180 tablet 1  . omeprazole (PRILOSEC) 40 MG capsule Take 30- 60 min before your first and last meals of the day 180 capsule 3  . SYMBICORT 160-4.5 MCG/ACT inhaler Inhale 2 puffs into the lungs 2 (two) times daily. 1 Inhaler 0  . Tiotropium Bromide Monohydrate (SPIRIVA RESPIMAT) 2.5 MCG/ACT AERS Inhale 2 puffs into the lungs daily. 1 Inhaler 2   No current facility-administered medications on file prior to visit.     BP (!) 142/54   Temp 98.4 F (36.9 C)   Wt 189 lb (85.7 kg)   BMI 26.36 kg/m       Objective:   Physical Exam Vitals signs and nursing note reviewed.  Constitutional:      Appearance: Normal appearance.  Skin:    General: Skin is warm and dry.     Capillary Refill: Capillary refill takes less than 2 seconds.  Neurological:     General: No focal deficit present.     Mental Status: He is alert and oriented to person, place, and time.  Psychiatric:        Mood and Affect: Mood normal.        Behavior: Behavior normal.        Thought Content: Thought content normal.        Judgment: Judgment normal.       Assessment & Plan:  1. Depression, recurrent ([place]) - Will increased Celexa to 20 mg.  - He starts Pulmonary rehab twice a week for 3 months, next week  - Advised to call up here and let me know how he is doing with the increased dose   [name], NP

## 2018-12-15 ENCOUNTER — Telehealth: Payer: Self-pay | Admitting: Internal Medicine

## 2018-12-15 NOTE — Telephone Encounter (Signed)
Called and spoke with patient's wife. She stated that the patient has his first appointment with pulmonary rehab tomorrow and the insurance will not cover it. Their insurance has no one in network around here that it can cover. They are unable to pay out of pocket for this and she wanted to know what their options were. MW please advise, thank you.

## 2018-12-15 NOTE — Telephone Encounter (Signed)
ATC pt, the line rang and then disconnected. Will try back.

## 2018-12-15 NOTE — Telephone Encounter (Signed)
Would not do it if not covered  - could send to [facility] to review coverage and let [facility] talk to rehab folks to see if there's any other way to file it

## 2018-12-15 NOTE — Telephone Encounter (Signed)
Called and spoke with patients wife, she is aware of Dr. Gustavus Bryant response. Patients wife verbalized understanding. [facility] can you help with this?

## 2018-12-16 ENCOUNTER — Encounter (HOSPITAL_COMMUNITY): Payer: Medicare Other

## 2018-12-16 NOTE — Telephone Encounter (Signed)
This is not something that we can change on how it's billed Medicare covers 80% then his secondary picks up what is left where they have to meet there deductible before it pays.  See note from rehab in the order-   Patient's wife called on 12/15/18 cancel their visit and to inform me that she contacted her secondary insurance and found out that they do not cover the 20% until they meet their $1250 of their OOP expenses. She said Dr. Melvyn Novas is working with them to try and get that waived. They want to hold off until they see if that can be done.  Diane Coad     This states Dr Melvyn Novas is going to try and get the deductible waived I don't think this is able to be done by our office and the patient should contact there insurance company because this is part of the insurance policy and how it's written.

## 2018-12-16 NOTE — Telephone Encounter (Signed)
Spoke with pt. He is aware that he needs to contact his insurance company. Nothing further was needed.

## 2018-12-16 NOTE — Telephone Encounter (Signed)
We have attempted to contact pt several times with no success. Per triage protocol, message will be closed.  

## 2018-12-21 ENCOUNTER — Encounter: Payer: Self-pay | Admitting: Interventional Cardiology

## 2019-01-06 ENCOUNTER — Ambulatory Visit (INDEPENDENT_AMBULATORY_CARE_PROVIDER_SITE_OTHER): Payer: Medicare Other | Admitting: Interventional Cardiology

## 2019-01-06 ENCOUNTER — Encounter: Payer: Self-pay | Admitting: Interventional Cardiology

## 2019-01-06 VITALS — BP 136/62 | HR 107 | Ht 71.0 in | Wt 191.8 lb

## 2019-01-06 DIAGNOSIS — R0602 Shortness of breath: Secondary | ICD-10-CM

## 2019-01-06 DIAGNOSIS — R0609 Other forms of dyspnea: Secondary | ICD-10-CM

## 2019-01-06 DIAGNOSIS — I2510 Atherosclerotic heart disease of native coronary artery without angina pectoris: Secondary | ICD-10-CM | POA: Insufficient documentation

## 2019-01-06 DIAGNOSIS — I2584 Coronary atherosclerosis due to calcified coronary lesion: Secondary | ICD-10-CM | POA: Diagnosis not present

## 2019-01-06 DIAGNOSIS — I10 Essential (primary) hypertension: Secondary | ICD-10-CM

## 2019-01-06 DIAGNOSIS — J441 Chronic obstructive pulmonary disease with (acute) exacerbation: Secondary | ICD-10-CM | POA: Diagnosis not present

## 2019-01-06 MED ORDER — METOPROLOL TARTRATE 100 MG PO TABS: ORAL_TABLET | ORAL | 0 refills | Status: DC

## 2019-01-06 NOTE — Patient Instructions (Addendum)
Medication Instructions:  Your physician recommends that you continue on your current medications as directed. Please refer to the Current Medication list given to you today.  If you need a refill on your cardiac medications before your next appointment, please call your pharmacy.   Lab work: Pro BNP today  If you have labs (blood work) drawn today and your tests are completely normal, you will receive your results only by: Marland Kitchen MyChart Message (if you have MyChart) OR . A paper copy in the mail If you have any lab test that is abnormal or we need to change your treatment, we will call you to review the results.  Testing/Procedures: Your physician has requested that you have an echocardiogram. Echocardiography is a painless test that uses sound waves to create images of your heart. It provides your doctor with information about the size and shape of your heart and how well your heart's chambers and valves are working. This procedure takes approximately one hour. There are no restrictions for this procedure.  Your physician has requested that you have cardiac CT. Cardiac computed tomography (CT) is a painless test that uses an x-ray machine to take clear, detailed pictures of your heart. For further information please visit [email or web address]. Please follow instruction sheet as given.    Follow-Up: Your physician recommends that you schedule a follow-up appointment as needed with Dr. [name].   Any Other Special Instructions Will Be Listed Below (If Applicable).   Please arrive at the [facility] main entrance of [facility] 30-45 minutes prior to test start time  [facility] [place],  [id number] [phone]  Proceed to the [facility] Radiology Department (First Floor).  Please follow these instructions carefully (unless otherwise directed):  Hold all erectile dysfunction medications at least 48 hours prior to test.  On the Night Before  the Test: . Be sure to Drink plenty of water. . Do not consume any caffeinated/decaffeinated beverages or chocolate 12 hours prior to your test. . Do not take any antihistamines 12 hours prior to your test. . If you take Metformin do not take 24 hours prior to test.  On the Day of the Test: . Drink plenty of water. Do not drink any water within one hour of the test. . Do not eat any food 4 hours prior to the test. . You may take your regular medications prior to the test.  . Take metoprolol (Lopressor) two hours prior to test. . HOLD Furosemide/Hydrochlorothiazide morning of the test.       After the Test: . Drink plenty of water. . After receiving IV contrast, you may experience a mild flushed feeling. This is normal. . On occasion, you may experience a mild rash up to 24 hours after the test. This is not dangerous. If this occurs, you can take Benadryl 25 mg and increase your fluid intake. . If you experience trouble breathing, this can be serious. If it is severe call 911 IMMEDIATELY. If it is mild, please call our office. . If you take any of these medications: Glipizide/Metformin, Avandament, Glucavance, please do not take 48 hours after completing test.

## 2019-01-06 NOTE — Progress Notes (Signed)
Cardiology Office Note:    Date:  01/06/2019   ID:  Chase Lloyd, DOB 1956-06-21, MRN [id number]  PCP:  [name], NP  Cardiologist:  No primary care provider on file.   Referring MD: [name], NP   Chief Complaint  Patient presents with  . Shortness of Breath    COPD    History of Present Illness:    Chase Lloyd is a 63 y.o. male with a hx of shortness of breath and recent diagnosis of acute systolic heart failure referred for consultation by [name], NP  Difficult to know exactly what the question is in this patient but after speaking with him the consultation is likely related to whether or not heart failure is playing any role in the patient's significant shortness of breath.  Since prior to a spine operation in September which was complicated by hematoma formation, the patient has noted progressive shortness of breath.  He was hospitalized after the surgical procedure because of pain related to the hematoma and was told that he developed pneumonia.  In reviewing the hospital discharge, the diagnosis suggests that the clinical diagnosis was COPD with exacerbation.  The patient also had hypertensive urgency and an elevated troponin.  Echocardiogram performed in December 2019 demonstrated mild concentric hypertrophy with moderate aortic regurgitation and preserved left ventricular systolic function with EF 60%.  Since September the patient's dyspnea has progressively worsened.  He feels his heart races when he walks.  He has noted some mild lower extremity edema.  He denies orthopnea.  He has not had syncope or chest pain.  Further interrogation of data in his chart reveals coronary artery calcification in all 3 vessels.  Stop smoking within the last 6 months.  No prior history of heart disease, heart murmur, or rheumatic fever.  Past Medical History:  Diagnosis Date  . Back pain   . COPD (chronic obstructive pulmonary disease) ([place])   . DDD (degenerative disc  disease), lumbar   . Depression   . Emphysema of lung ([place])   . Empyema ([place])    2016  (was in [place])  . GERD (gastroesophageal reflux disease)   . Hearing loss of right ear    started 2001.     microphone in right ear ,  and hearing aid in left  . Hodgkin's disease ([place]) 1999  . Hypotension   . Meniere's disease   . Pneumonia   . Tinnitus     Past Surgical History:  Procedure Laterality Date  . BACK SURGERY    . CERVICAL FUSION  2016  . ELBOW SURGERY    . EYE SURGERY    . IR THORACENTESIS ASP PLEURAL SPACE W/IMG GUIDE  10/08/2018  . LAMINECTOMY  1999  . lower back surgery    . TONSILLECTOMY      Current Medications: Current Meds  Medication Sig  . Albuterol Sulfate 108 (90 Base) MCG/ACT AEPB Inhale 1-2 puffs into the lungs every 6 (six) hours as needed.  . citalopram (CELEXA) 20 MG tablet Take 1 tablet (20 mg total) by mouth daily.  . furosemide (LASIX) 20 MG tablet Take 1 tablet (20 mg total) by mouth daily.  Marland Kitchen gabapentin (NEURONTIN) 300 MG capsule Take 1 capsule (300 mg total) by mouth 2 (two) times daily.  Marland Kitchen ibuprofen (ADVIL,MOTRIN) 800 MG tablet TAKE 1 TABLET BY MOUTH EVERY 8 HOURS AS NEEDED  . omeprazole (PRILOSEC) 40 MG capsule Take 30- 60 min before your first and last meals of  the day  . SYMBICORT 160-4.5 MCG/ACT inhaler Inhale 2 puffs into the lungs 2 (two) times daily.  . Tiotropium Bromide Monohydrate (SPIRIVA RESPIMAT) 2.5 MCG/ACT AERS Inhale 2 puffs into the lungs daily.     Allergies:   Patient has no known allergies.   Social History   Socioeconomic History  . Marital status: Married    Spouse name: Not on file  . Number of children: Not on file  . Years of education: Not on file  . Highest education level: Not on file  Occupational History  . Occupation: Retired  Scientific laboratory technician  . Financial resource strain: Not on file  . Food insecurity:    Worry: Not on file    Inability: Not on file  . Transportation needs:    Medical: Not on file     Non-medical: Not on file  Tobacco Use  . Smoking status: Former Smoker    Packs/day: 1.50    Years: 47.00    Pack years: 70.50    Types: Cigarettes    Last attempt to quit: 06/13/2018    Years since quitting: 0.5  . Smokeless tobacco: Never Used  Substance and Sexual Activity  . Alcohol use: Yes    Comment: 24 cans in a week  . Drug use: No  . Sexual activity: Not on file  Lifestyle  . Physical activity:    Days per week: Not on file    Minutes per session: Not on file  . Stress: Not on file  Relationships  . Social connections:    Talks on phone: Not on file    Gets together: Not on file    Attends religious service: Not on file    Active member of club or organization: Not on file    Attends meetings of clubs or organizations: Not on file    Relationship status: Not on file  Other Topics Concern  . Not on file  Social History Narrative   Retied - worked as Furniture conservator/restorer         Family History: The patient's family history includes Diabetes in his father; Hypertension in his father; Melanoma in his brother; Testicular cancer in his brother.  ROS:   Please see the history of present illness.    He has noticed some leg swelling, significant shortness of breath with activity, back pain, muscle pain, depression, and recent pneumonia.  All other systems reviewed and are negative.  EKGs/Labs/Other Studies Reviewed:    The following studies were reviewed today:  CT scan October 06, 2018: IMPRESSION: Advanced emphysema. Postoperative changes in the right upper lobe. Small bilateral pleural effusions.  Coronary artery disease, aortic atherosclerosis.  No evidence of pulmonary embolus.  2D Doppler echocardiography 10/07/2018: Study Conclusions  - Left ventricle: The cavity size was normal. There was mild   concentric hypertrophy. Systolic function was normal. The   estimated ejection fraction was in the range of 60% to 65%. Wall   motion was normal; there were no  regional wall motion   abnormalities. Doppler parameters are consistent with abnormal   left ventricular relaxation (grade 1 diastolic dysfunction).   There was no evidence of elevated ventricular filling pressure by   Doppler parameters. - Aortic valve: Trileaflet; mildly thickened leaflets. There was   moderate regurgitation. - Mitral valve: There was mild regurgitation. - Left atrium: The atrium was normal in size. - Right ventricle: Systolic function was normal. - Tricuspid valve: There was mild regurgitation. - Pulmonic valve: There was no regurgitation. -  Pulmonary arteries: Systolic pressure was within the normal   range. - Inferior vena cava: The vessel was normal in size. - Pericardium, extracardiac: There was no pericardial effusion.   EKG:  EKG EKG performed October 06, 2018 reveals sinus tachycardia, vertical axis, and prominent voltage.  Recent Labs: 05/31/2018: TSH 6.40 10/06/2018: B Natriuretic Peptide 65.7 10/13/2018: ALT 250 10/22/2018: Hemoglobin 13.3; Platelets 158.0 11/10/2018: BUN 14; Creatinine, Ser 1.18; Magnesium 1.6; Potassium 3.9; Sodium 138  Recent Lipid Panel    Component Value Date/Time   CHOL 182 05/05/2018 0803   TRIG 153.0 (H) 05/05/2018 0803   HDL 60.50 05/05/2018 0803   CHOLHDL 3 05/05/2018 0803   VLDL 30.6 05/05/2018 0803   LDLCALC 91 05/05/2018 0803    Physical Exam:    VS:  BP 136/62   Pulse (!) 107   Ht 5\' 11"  (1.803 m)   Wt 191 lb 12.8 oz (87 kg)   SpO2 96%   BMI 26.75 kg/m     Wt Readings from Last 3 Encounters:  01/06/19 191 lb 12.8 oz (87 kg)  12/14/18 189 lb (85.7 kg)  12/03/18 189 lb 6.4 oz (85.9 kg)     GEN: Slender, somewhat pale appearing. No acute distress HEENT: Normal NECK: No JVD. LYMPHATICS: No lymphadenopathy CARDIAC: RRR.  1-2 over 6 right upper sternal border systolic murmur.  No significant diastolic murmur, S4 gallop, no edema VASCULAR: 2+ bilateral posterior tibial and popliteal pulses, no  bruits RESPIRATORY:  Clear to auscultation without rales, wheezing or rhonchi  ABDOMEN: Soft, non-tender, non-distended, No pulsatile mass, MUSCULOSKELETAL: No deformity  SKIN: Warm and dry NEUROLOGIC:  Alert and oriented x 3 PSYCHIATRIC:  Normal affect   ASSESSMENT:    1. Coronary artery calcification   2. DOE (dyspnea on exertion)   3. Essential hypertension   4. COPD with acute exacerbation ([place])   5. SOB (shortness of breath)    PLAN:    In order of problems listed above:  1. Coronary CT with morphology and FFR if indicated.  Concerned the patient may have severe CAD causing diastolic heart failure and dyspnea 2. BNP will be obtained today.  Repeat echo will be done to assess LV function.  Echo will also reassess aortic valve which showed "moderate aortic regurgitation" in December 2019.  There is no auscultatory evidence by exam. 3. Blood pressure is adequately controlled.  There is certainly no pulse pressure consistent with severe aortic regurgitation. 4. This problem may be severe.  Overall, this patient likely has progressive severe COPD causing dyspnea and decreased quality of life.  Rule out severe three-vessel coronary artery disease with coronary CTA and morphology/FFR if indicated.  He is not wheezing so I believe he can tolerate beta-blocker therapy.  Repeat 2D Doppler echo will reassess whether or not aortic valve lesion is real.  May also give some idea about the presence or absence of pulmonary hypertension.  Follow-up will be determined by the presence or absence of findings on work-up.   Medication Adjustments/Labs and Tests Ordered: Current medicines are reviewed at length with the patient today.  Concerns regarding medicines are outlined above.  No orders of the defined types were placed in this encounter.  No orders of the defined types were placed in this encounter.   There are no Patient Instructions on file for this visit.   Signed, Sinclair Grooms, MD  01/06/2019 3:09 PM    [facility]

## 2019-01-07 LAB — PRO B NATRIURETIC PEPTIDE: NT-Pro BNP: 239 pg/mL — ABNORMAL HIGH (ref 0–210)

## 2019-01-12 ENCOUNTER — Ambulatory Visit (HOSPITAL_COMMUNITY): Payer: Medicare Other | Attending: Interventional Cardiology

## 2019-01-12 ENCOUNTER — Other Ambulatory Visit: Payer: Self-pay

## 2019-01-12 ENCOUNTER — Encounter (INDEPENDENT_AMBULATORY_CARE_PROVIDER_SITE_OTHER): Payer: Self-pay

## 2019-01-12 DIAGNOSIS — R0602 Shortness of breath: Secondary | ICD-10-CM | POA: Insufficient documentation

## 2019-01-20 ENCOUNTER — Telehealth: Payer: Self-pay | Admitting: Internal Medicine

## 2019-01-20 NOTE — Telephone Encounter (Signed)
Called and spoke with [place]. She stated that the patients wife stated that the patient is needing to fly to [place] but he cannot do so until he has his CT scan. This is scheduled for 3/27 but she wants it done tomorrow morning.   MW please advise if this is ok thank you.

## 2019-01-21 NOTE — Telephone Encounter (Signed)
Spoke with the pt and notified of recs per MW  He verbalized understanding  He will defer CT for now

## 2019-01-21 NOTE — Telephone Encounter (Signed)
Let him know I reviewed his record and see this was ordered by Dr Loanne Drilling before xmas 2020  but that was a much different situation than we're dealing with today.  There is no urgency to this scan which was ordered just to be complete, not to work up an active problem, and we're canceling this type of CT to reduce use of our scanners and exposures to the coronavirus  - if I thought it was urgent based on his records I'd agree to do it or if he had ongoing symptoms then it would be more pressing.   Hopefully he's not traveling to [facility] just to visit as risk of COVID-19 is much higher there and he would be very high risk of morbidity/mortality if he gets it.   If he is happy with this fine - if he still feels it's urgent then send this  to Dr Loanne Drilling to see if she still feels it's needed.

## 2019-01-26 ENCOUNTER — Telehealth: Payer: Self-pay | Admitting: *Deleted

## 2019-01-26 NOTE — Telephone Encounter (Signed)
Called pt canceled appointment for CT per DR Loanne Drilling due to COVID Restrictions. Will need to reschedule. PT is aware and understands.

## 2019-01-28 ENCOUNTER — Inpatient Hospital Stay: Admission: RE | Admit: 2019-01-28 | Payer: Medicare Other | Source: Ambulatory Visit

## 2019-02-03 ENCOUNTER — Ambulatory Visit (HOSPITAL_COMMUNITY): Payer: Medicare Other

## 2019-02-14 ENCOUNTER — Ambulatory Visit: Payer: Self-pay | Admitting: Adult Health

## 2019-02-14 ENCOUNTER — Encounter (HOSPITAL_COMMUNITY): Payer: Self-pay | Admitting: *Deleted

## 2019-02-14 ENCOUNTER — Emergency Department (HOSPITAL_COMMUNITY): Payer: Medicare Other

## 2019-02-14 ENCOUNTER — Emergency Department (HOSPITAL_COMMUNITY)
Admission: EM | Admit: 2019-02-14 | Discharge: 2019-02-14 | Disposition: A | Discharge status: Home / Self Care | Payer: Medicare Other | Attending: Emergency Medicine | Admitting: Emergency Medicine

## 2019-02-14 ENCOUNTER — Other Ambulatory Visit: Payer: Self-pay

## 2019-02-14 DIAGNOSIS — R000 Tachycardia, unspecified: Secondary | ICD-10-CM | POA: Diagnosis not present

## 2019-02-14 DIAGNOSIS — Z87891 Personal history of nicotine dependence: Secondary | ICD-10-CM | POA: Insufficient documentation

## 2019-02-14 DIAGNOSIS — R1031 Right lower quadrant pain: Secondary | ICD-10-CM

## 2019-02-14 DIAGNOSIS — J449 Chronic obstructive pulmonary disease, unspecified: Secondary | ICD-10-CM | POA: Insufficient documentation

## 2019-02-14 DIAGNOSIS — Z79899 Other long term (current) drug therapy: Secondary | ICD-10-CM | POA: Insufficient documentation

## 2019-02-14 DIAGNOSIS — R109 Unspecified abdominal pain: Secondary | ICD-10-CM | POA: Diagnosis not present

## 2019-02-14 DIAGNOSIS — R0602 Shortness of breath: Secondary | ICD-10-CM | POA: Diagnosis not present

## 2019-02-14 LAB — CBC WITH DIFFERENTIAL/PLATELET
Abs Immature Granulocytes: 0.02 10*3/uL (ref 0.00–0.07)
Basophils Absolute: 0 10*3/uL (ref 0.0–0.1)
Basophils Relative: 1 %
Eosinophils Absolute: 0 10*3/uL (ref 0.0–0.5)
Eosinophils Relative: 1 %
HCT: 40.1 % (ref 39.0–52.0)
Hemoglobin: 13.1 g/dL (ref 13.0–17.0)
Immature Granulocytes: 1 %
Lymphocytes Relative: 16 %
Lymphs Abs: 0.6 10*3/uL — ABNORMAL LOW (ref 0.7–4.0)
MCH: 32 pg (ref 26.0–34.0)
MCHC: 32.7 g/dL (ref 30.0–36.0)
MCV: 98 fL (ref 80.0–100.0)
Monocytes Absolute: 0.3 10*3/uL (ref 0.1–1.0)
Monocytes Relative: 8 %
Neutro Abs: 2.8 10*3/uL (ref 1.7–7.7)
Neutrophils Relative %: 73 %
Platelets: 150 10*3/uL (ref 150–400)
RBC: 4.09 MIL/uL — ABNORMAL LOW (ref 4.22–5.81)
RDW: 12.9 % (ref 11.5–15.5)
WBC: 3.8 10*3/uL — ABNORMAL LOW (ref 4.0–10.5)
nRBC: 0 % (ref 0.0–0.2)

## 2019-02-14 LAB — COMPREHENSIVE METABOLIC PANEL
ALT: 18 U/L (ref 0–44)
AST: 21 U/L (ref 15–41)
Albumin: 3.8 g/dL (ref 3.5–5.0)
Alkaline Phosphatase: 81 U/L (ref 38–126)
Anion gap: 12 (ref 5–15)
BUN: 7 mg/dL — ABNORMAL LOW (ref 8–23)
CO2: 27 mmol/L (ref 22–32)
Calcium: 10 mg/dL (ref 8.9–10.3)
Chloride: 99 mmol/L (ref 98–111)
Creatinine, Ser: 1.1 mg/dL (ref 0.61–1.24)
GFR calc Af Amer: >60 mL/min (ref >60)
GFR calc non Af Amer: >60 mL/min (ref >60)
Glucose, Bld: 97 mg/dL (ref 70–99)
Potassium: 3.7 mmol/L (ref 3.5–5.1)
Sodium: 138 mmol/L (ref 135–145)
Total Bilirubin: 1 mg/dL (ref 0.3–1.2)
Total Protein: 7.3 g/dL (ref 6.5–8.1)

## 2019-02-14 LAB — URINALYSIS, ROUTINE W REFLEX MICROSCOPIC
Bacteria, UA: NONE SEEN
Bilirubin Urine: NEGATIVE
Glucose, UA: NEGATIVE mg/dL
Ketones, ur: NEGATIVE mg/dL
Leukocytes,Ua: NEGATIVE
Nitrite: NEGATIVE
Protein, ur: NEGATIVE mg/dL
Specific Gravity, Urine: 1.009 (ref 1.005–1.030)
pH: 6 (ref 5.0–8.0)

## 2019-02-14 MED ORDER — ONDANSETRON HCL 4 MG/2ML IJ SOLN
4.0000 mg | Freq: Once | INTRAMUSCULAR | Status: AC
  Administered 2019-02-14: 13:00:00 4 mg via INTRAVENOUS
  Filled 2019-02-14: qty 2

## 2019-02-14 MED ORDER — MORPHINE SULFATE (PF) 4 MG/ML IV SOLN
4.0000 mg | Freq: Once | INTRAVENOUS | Status: AC
  Administered 2019-02-14: 13:00:00 4 mg via INTRAVENOUS
  Filled 2019-02-14: qty 1

## 2019-02-14 MED ORDER — IOHEXOL 300 MG/ML  SOLN
100.0000 mL | Freq: Once | INTRAMUSCULAR | Status: AC | PRN
  Administered 2019-02-14: 100 mL via INTRAVENOUS

## 2019-02-14 MED ORDER — SODIUM CHLORIDE 0.9 % IV BOLUS
1000.0000 mL | Freq: Once | INTRAVENOUS | Status: AC
  Administered 2019-02-14: 1000 mL via INTRAVENOUS

## 2019-02-14 MED ORDER — TRAMADOL HCL 50 MG PO TABS: 50.0000 mg | ORAL_TABLET | Freq: Four times a day (QID) | ORAL | 0 refills | Status: DC | PRN

## 2019-02-14 NOTE — ED Notes (Signed)
Patient transported to X-ray 

## 2019-02-14 NOTE — Telephone Encounter (Signed)
Pt. Started having abdominal pain 2 days ago. Right side, slightly above his naval. Also having black, loose stools. Loss of appetite. No bright red blood. Has taken Pepto. Instructed to go to ED for evaluation due to pain and possible blood in stool.Verbalizes understanding.  Reason for Disposition . Black or tarry bowel movements  (Exception: chronic-unchanged  black-grey bowel movements AND is taking iron pills or Pepto-bismol)  Answer Assessment - Initial Assessment Questions 1. LOCATION: "Where does it hurt?"      Hurts right side above the belly button 2. RADIATION: "Does the pain shoot anywhere else?" (e.g., chest, back)     Whole stoach hurts 3. ONSET: "When did the pain begin?" (Minutes, hours or days ago)      2 days ago 4. SUDDEN: "Gradual or sudden onset?"     Sudden 5. PATTERN "Does the pain come and go, or is it constant?"    - If constant: "Is it getting better, staying the same, or worsening?"      (Note: Constant means the pain never goes away completely; most serious pain is constant and it progresses)     - If intermittent: "How Wass does it last?" "Do you have pain now?"     (Note: Intermittent means the pain goes away completely between bouts)     Constant 6. SEVERITY: "How bad is the pain?"  (e.g., Scale 1-10; mild, moderate, or severe)    - MILD (1-3): doesn't interfere with normal activities, abdomen soft and not tender to touch     - MODERATE (4-7): interferes with normal activities or awakens from sleep, tender to touch     - SEVERE (8-10): excruciating pain, doubled over, unable to do any normal activities       5-8 7. RECURRENT SYMPTOM: "Have you ever had this type of abdominal pain before?" If so, ask: "When was the last time?" and "What happened that time?"      No 8. CAUSE: "What do you think is causing the abdominal pain?"     Unsure 9. RELIEVING/AGGRAVATING FACTORS: "What makes it better or worse?" (e.g., movement, antacids, bowel movement)      Nothing 10. OTHER SYMPTOMS: "Has there been any vomiting, diarrhea, constipation, or urine problems?"       Black stools - loose stools.  Protocols used: ABDOMINAL PAIN - MALE-A-AH

## 2019-02-14 NOTE — ED Notes (Signed)
Pt verbalized understanding of discharge paperwork, follow-up care and prescriptions. Wife updated by PA via phone

## 2019-02-14 NOTE — ED Notes (Signed)
ED Provider at bedside. 

## 2019-02-14 NOTE — ED Notes (Signed)
Pt at CT scan.

## 2019-02-14 NOTE — ED Triage Notes (Signed)
Pt in c/o RUQ pain constant and stabbing onset x 2 days, denies n/v/d, pt A&O x4

## 2019-02-14 NOTE — Discharge Instructions (Signed)
Return here as needed.  Follow-up with the GI specialist provided.  Also follow-up with your primary doctor.

## 2019-02-15 ENCOUNTER — Telehealth: Payer: Self-pay

## 2019-02-15 NOTE — Telephone Encounter (Signed)
Copied from [place] [id number]. Topic: Referral - Request for Referral >> Feb 14, 2019  4:08 PM Alanda Slim E wrote: Has patient seen PCP for this complaint? No  *If NO, is insurance requiring patient see PCP for this issue before PCP can refer them? Referral for which specialty: Gastroenterologist  Preferred provider/office:  Reason for referral: severe stomach pains

## 2019-02-15 NOTE — ED Provider Notes (Signed)
[facility] EMERGENCY DEPARTMENT Provider Note   CSN: [id number] Arrival date & time: 02/14/19  4854    History   Chief Complaint Chief Complaint  Patient presents with   Abdominal Pain    HPI Chase Lloyd is a 63 y.o. male.     HPI Patient presents to the emergency department with right-sided abdominal pain that is been ongoing for 3 days.  Patient states that he has had no nausea or vomiting.  He states he is also had no diarrhea.  Patient states nothing seems make the condition better or worse.  Patient states that he did not take any medications prior to arrival.  The patient denies chest pain, shortness of breath, headache,blurred vision, neck pain, fever, cough, weakness, numbness, dizziness, anorexia, edema,  nausea, vomiting, diarrhea, rash, back pain, dysuria, hematemesis, bloody stool, near syncope, or syncope. Past Medical History:  Diagnosis Date   Back pain    COPD (chronic obstructive pulmonary disease) (HCC)    DDD (degenerative disc disease), lumbar    Depression    Emphysema of lung ([place])    Empyema ([place])    2016  (was in [place])   GERD (gastroesophageal reflux disease)    Hearing loss of right ear    started 2001.     microphone in right ear ,  and hearing aid in left   Hodgkin's disease ([place]) 1999   Hypotension    Meniere's disease    Pneumonia    Tinnitus     Patient Active Problem List   Diagnosis Date Noted   CAD (coronary artery disease) 01/06/2019   Chronic respiratory failure with hypoxia ([place]) 12/04/2018   COPD GOLD III  10/23/2018   Mixed restrictive and obstructive lung disease ([place]) 10/16/2018   DOE (dyspnea on exertion) 10/08/2018   Hypertensive urgency 10/07/2018   Troponin I above reference range 10/07/2018   Acute respiratory distress 10/06/2018   Essential hypertension 09/22/2018   Low back pain 07/18/2018   Traumatic subdural hematoma of neuraxis ([place]) 07/18/2018   Lumbar spinal  stenosis 07/16/2018   GERD (gastroesophageal reflux disease) 04/26/2018   Intermittent claudication ([place]) 06/12/2017   Lumbar spondylosis 06/12/2017   COPD with acute exacerbation ([place]) 05/29/2017   Tendinopathy of left gluteus medius 05/01/2017   Former cigarette smoker - 70 pack year hx 05/01/2017    Past Surgical History:  Procedure Laterality Date   BACK SURGERY     CERVICAL FUSION  2016   ELBOW SURGERY     EYE SURGERY     IR THORACENTESIS ASP PLEURAL SPACE W/IMG GUIDE  10/08/2018   LAMINECTOMY  1999   lower back surgery     TONSILLECTOMY          Home Medications    Prior to Admission medications   Medication Sig Start Date End Date Taking? Authorizing Provider  Albuterol Sulfate 108 (90 Base) MCG/ACT AEPB Inhale 1-2 puffs into the lungs every 6 (six) hours as needed (wheezing).    Yes [provider]  furosemide (LASIX) 20 MG tablet Take 1 tablet (20 mg total) by mouth daily. 10/12/18 10/12/19 Yes Elgergawy, Silver Huguenin, MD  ibuprofen (ADVIL,MOTRIN) 800 MG tablet TAKE 1 TABLET BY MOUTH EVERY 8 HOURS AS NEEDED Patient taking differently: Take 800 mg by mouth every 8 (eight) hours as needed (back pain).  11/09/18  Yes Nafziger, Tommi Rumps, NP  omeprazole (PRILOSEC) 40 MG capsule Take 30- 60 min before your first and last meals of the day Patient  taking differently: Take 40 mg by mouth 2 (two) times daily. Take 30- 60 min before your first and last meals of the day 10/22/18  Yes Tanda Rockers, MD  SYMBICORT 160-4.5 MCG/ACT inhaler Inhale 2 puffs into the lungs 2 (two) times daily. 09/09/18  Yes Tanda Rockers, MD  Tiotropium Bromide Monohydrate (SPIRIVA RESPIMAT) 2.5 MCG/ACT AERS Inhale 2 puffs into the lungs daily. 09/10/18  Yes Lauraine Rinne, NP  citalopram (CELEXA) 20 MG tablet Take 1 tablet (20 mg total) by mouth daily. Patient not taking: Reported on 02/14/2019 12/14/18   [name], NP  gabapentin (NEURONTIN) 300 MG capsule Take 1 capsule (300 mg total) by  mouth 2 (two) times daily. Patient not taking: Reported on 02/14/2019 11/11/18 02/09/19  [name], NP  metoprolol tartrate (LOPRESSOR) 100 MG tablet Take one tablet by mouth 2 hours prior to your CT Patient not taking: Reported on 02/14/2019 01/06/19   Belva Crome, MD  traMADol (ULTRAM) 50 MG tablet Take 1 tablet (50 mg total) by mouth every 6 (six) hours as needed for severe pain. 02/14/19   Kevina Piloto, Harrell Gave, PA-C    Family History Family History  Problem Relation Age of Onset   Hypertension Father    Diabetes Father    Testicular cancer Brother    Melanoma Brother     Social History Social History   Tobacco Use   Smoking status: Former Smoker    Packs/day: 1.50    Years: 47.00    Pack years: 70.50    Types: Cigarettes    Last attempt to quit: 06/13/2018    Years since quitting: 0.6   Smokeless tobacco: Never Used  Substance Use Topics   Alcohol use: Yes    Comment: 24 cans in a week   Drug use: No     Allergies   Patient has no known allergies.   Review of Systems Review of Systems All other systems negative except as documented in the HPI. All pertinent positives and negatives as reviewed in the HPI.  Physical Exam Updated Vital Signs BP (!) 166/74 (BP Location: Right Arm)    Pulse (!) 101    Temp 98.6 F (37 C) (Oral)    Resp (!) 22    Ht 5\' 11"  (1.803 m)    Wt 84.4 kg    SpO2 100%    BMI 25.94 kg/m   Physical Exam Vitals signs and nursing note reviewed.  Constitutional:      General: He is not in acute distress.    Appearance: He is well-developed.  HENT:     Head: Normocephalic and atraumatic.  Eyes:     Pupils: Pupils are equal, round, and reactive to light.  Neck:     Musculoskeletal: Normal range of motion and neck supple.  Cardiovascular:     Rate and Rhythm: Normal rate and regular rhythm.     Heart sounds: Normal heart sounds. No murmur. No friction rub. No gallop.   Pulmonary:     Effort: Pulmonary effort is normal. No respiratory  distress.     Breath sounds: Normal breath sounds. No wheezing.  Abdominal:     General: Bowel sounds are normal. There is no distension.     Palpations: Abdomen is soft.     Tenderness: There is abdominal tenderness in the right lower quadrant.  Skin:    General: Skin is warm and dry.     Capillary Refill: Capillary refill takes less than 2 seconds.  Findings: No erythema or rash.  Neurological:     Mental Status: He is alert and oriented to person, place, and time.     Motor: No abnormal muscle tone.     Coordination: Coordination normal.  Psychiatric:        Behavior: Behavior normal.      ED Treatments / Results  Labs (all labs ordered are listed, but only abnormal results are displayed) Labs Reviewed  COMPREHENSIVE METABOLIC PANEL - Abnormal; Notable for the following components:      Result Value   BUN 7 (*)    All other components within normal limits  CBC WITH DIFFERENTIAL/PLATELET - Abnormal; Notable for the following components:   WBC 3.8 (*)    RBC 4.09 (*)    Lymphs Abs 0.6 (*)    All other components within normal limits  URINALYSIS, ROUTINE W REFLEX MICROSCOPIC - Abnormal; Notable for the following components:   Hgb urine dipstick SMALL (*)    All other components within normal limits    EKG EKG Interpretation  Date/Time:  Monday February 14 2019 13:33:09 EDT Ventricular Rate:  108 PR Interval:    QRS Duration: 98 QT Interval:  325 QTC Calculation: 436 R Axis:   86 Text Interpretation:  Sinus tachycardia Probable inferior infarct, old No acute changes No significant change since last tracing Confirmed by Varney Biles [phone]) on 02/14/2019 1:36:37 PM   Radiology Dg Chest 2 View  Result Date: 02/14/2019 CLINICAL DATA:  Shortness of breath.  Right-sided flank pain. EXAM: CHEST - 2 VIEW COMPARISON:  Chest x-rays dated 10/15/2018 and 04/26/2018 and chest CT dated 10/04/2018 FINDINGS: Heart size and pulmonary vascularity are normal. The patient has  chronic interstitial and obstructive lung disease as demonstrated on prior exams. The interstitial disease is most prominent at the right lung base. There is slight chronic blunting of the right costophrenic angle with slight irregular chronic pleural thickening at the right lung base. No acute infiltrates or effusions.  No acute bone abnormality. IMPRESSION: No acute abnormalities. Chronic interstitial and obstructive lung disease. Electronically Signed   By: Lorriane Shire M.D.   On: 02/14/2019 14:09   Ct Abdomen Pelvis W Contrast  Result Date: 02/14/2019 CLINICAL DATA:  63 year old male with acute RIGHT abdominal pain for 2 days. EXAM: CT ABDOMEN AND PELVIS WITH CONTRAST TECHNIQUE: Multidetector CT imaging of the abdomen and pelvis was performed using the standard protocol following bolus administration of intravenous contrast. CONTRAST:  134mL OMNIPAQUE IOHEXOL 300 MG/ML  SOLN COMPARISON:  10/04/2018 chest CT. FINDINGS: Lower chest: A small RIGHT pleural effusion and RIGHT basilar atelectasis have slightly decreased. Hepatobiliary: The liver and gallbladder are unremarkable. No biliary dilatation. Pancreas: Unremarkable Spleen: A 1.5 cm indeterminate hypoechoic mass is again noted (series 3: Image 14). The remainder of the spleen is unremarkable. Adrenals/Urinary Tract: The kidneys, adrenal glands and bladder are unremarkable except for a RIGHT renal cyst. Stomach/Bowel: Stomach is within normal limits. Appendix appears normal. No evidence of bowel wall thickening, distention, or inflammatory changes. Vascular/Lymphatic: Aortic atherosclerosis. No enlarged abdominal or pelvic lymph nodes. Reproductive: Prostate is unremarkable. Other: No ascites, focal collection or pneumoperitoneum. Musculoskeletal: No acute or suspicious bony abnormalities. Posterior/interbody fusion changes from L3-L5 again noted. IMPRESSION: 1. No acute abnormality. 2. Slightly decreased small RIGHT pleural effusion and RIGHT basilar  atelectasis. 3. Unchanged 1.5 cm splenic mass, statistically benign. 4.  Aortic Atherosclerosis (ICD10-I70.0). Electronically Signed   By: Margarette Canada M.D.   On: 02/14/2019 12:04    Procedures  Procedures (including critical care time)  Medications Ordered in ED Medications  sodium chloride 0.9 % bolus 1,000 mL (0 mLs Intravenous Stopped 02/14/19 1241)  iohexol (OMNIPAQUE) 300 MG/ML solution 100 mL (100 mLs Intravenous Contrast Given 02/14/19 1145)  morphine 4 MG/ML injection 4 mg (4 mg Intravenous Given 02/14/19 1237)  ondansetron (ZOFRAN) injection 4 mg (4 mg Intravenous Given 02/14/19 1237)     Initial Impression / Assessment and Plan / ED Course  I have reviewed the triage vital signs and the nursing notes.  Pertinent labs & imaging results that were available during my care of the patient were reviewed by me and considered in my medical decision making (see chart for details).        Patient is advised of the CT scan results and that this could be an evolving process that is yet to fully declare itself.  Patient is advised to return here for any worsening in his condition.  I did give any follow-up with GI.  Advised patient that he needs to return for any worsening in his condition.  Final Clinical Impressions(s) / ED Diagnoses   Final diagnoses:  Right lower quadrant abdominal pain    ED Discharge Orders         Ordered    traMADol (ULTRAM) 50 MG tablet  Every 6 hours PRN     02/14/19 [address] PA-C 02/15/19 [place], Ankit, MD 02/18/19 1708

## 2019-02-15 NOTE — Telephone Encounter (Signed)
LMOM for a return call.  

## 2019-02-16 ENCOUNTER — Other Ambulatory Visit: Payer: Self-pay

## 2019-02-16 ENCOUNTER — Encounter: Payer: Self-pay | Admitting: Adult Health

## 2019-02-16 ENCOUNTER — Ambulatory Visit (INDEPENDENT_AMBULATORY_CARE_PROVIDER_SITE_OTHER): Payer: Medicare Other | Admitting: Adult Health

## 2019-02-16 DIAGNOSIS — I2510 Atherosclerotic heart disease of native coronary artery without angina pectoris: Secondary | ICD-10-CM

## 2019-02-16 DIAGNOSIS — I2584 Coronary atherosclerosis due to calcified coronary lesion: Secondary | ICD-10-CM

## 2019-02-16 DIAGNOSIS — R1031 Right lower quadrant pain: Secondary | ICD-10-CM | POA: Diagnosis not present

## 2019-02-16 DIAGNOSIS — K5900 Constipation, unspecified: Secondary | ICD-10-CM | POA: Diagnosis not present

## 2019-02-16 NOTE — Telephone Encounter (Signed)
Pt now scheduled to see Tommi Rumps at 2 PM today.  Nothing further needed.

## 2019-02-16 NOTE — Progress Notes (Addendum)
Virtual Visit via Video Note  I connected with Ananias Pilgrim on 02/16/19 at  2:00 PM EDT by a video enabled telemedicine application and verified that I am speaking with the correct person using two identifiers.  Location patient: home Location provider:work or home office Persons participating in the virtual visit: patient, provider  I discussed the limitations of evaluation and management by telemedicine and the availability of in person appointments. The patient expressed understanding and agreed to proceed.   HPI: 63 year old male who is being evaluated today for right lower quadrant abdominal pain.  He was seen in the emergency room 2 days ago for right-sided abdominal pain that has been present for 3 days prior.  At this time he denied any nausea vomiting, chest pain, back pain, syncope, or bloody stool.  He did report dark stool but also took six pepto bismol prior to being seen in the emergency room.  Has not had any dark stool since  In the ER his labs, CT scan of the abdomen, and chest x-ray were all reassuring, he did have a small amount of hemoglobin on his urine dipstick.  Per patient he reports that on the night he went to the emergency room the pain is at its worst "I felt like he might be passing a kidney stone or something".  He was given tramadol in the emergency room which he reports he last took at 7 AM this morning and does not plan on taking anymore because it makes him feel fatigued and has caused constipation.  He continues to have intermittent "sharp" pain in the right lower abdomen, he rates his pain at 2 out of 10.  These sharp pains are coming multiple times throughout the day and have been present since he left the emergency room.  Does have a history of chronic diarrhea and has not had a recent colonoscopy   Is recommended by the ER that he follow-up with gastroenterology    ROS: See pertinent positives and negatives per HPI.  Past Medical History:  Diagnosis  Date  . Back pain   . COPD (chronic obstructive pulmonary disease) ([place])   . DDD (degenerative disc disease), lumbar   . Depression   . Emphysema of lung ([place])   . Empyema ([place])    2016  (was in [place])  . GERD (gastroesophageal reflux disease)   . Hearing loss of right ear    started 2001.     microphone in right ear ,  and hearing aid in left  . Hodgkin's disease ([place]) 1999  . Hypotension   . Meniere's disease   . Pneumonia   . Tinnitus     Past Surgical History:  Procedure Laterality Date  . BACK SURGERY    . CERVICAL FUSION  2016  . ELBOW SURGERY    . EYE SURGERY    . IR THORACENTESIS ASP PLEURAL SPACE W/IMG GUIDE  10/08/2018  . LAMINECTOMY  1999  . lower back surgery    . TONSILLECTOMY      Family History  Problem Relation Age of Onset  . Hypertension Father   . Diabetes Father   . Testicular cancer Brother   . Melanoma Brother      Current Outpatient Medications:  .  Albuterol Sulfate 108 (90 Base) MCG/ACT AEPB, Inhale 1-2 puffs into the lungs every 6 (six) hours as needed (wheezing). , Disp: , Rfl:  .  citalopram (CELEXA) 20 MG tablet, Take 1 tablet (20 mg total) by mouth daily. (  Patient not taking: Reported on 02/14/2019), Disp: 30 tablet, Rfl: 3 .  furosemide (LASIX) 20 MG tablet, Take 1 tablet (20 mg total) by mouth daily., Disp: 30 tablet, Rfl: 11 .  gabapentin (NEURONTIN) 300 MG capsule, Take 1 capsule (300 mg total) by mouth 2 (two) times daily. (Patient not taking: Reported on 02/14/2019), Disp: 180 capsule, Rfl: 0 .  ibuprofen (ADVIL,MOTRIN) 800 MG tablet, TAKE 1 TABLET BY MOUTH EVERY 8 HOURS AS NEEDED (Patient taking differently: Take 800 mg by mouth every 8 (eight) hours as needed (back pain). ), Disp: 180 tablet, Rfl: 1 .  metoprolol tartrate (LOPRESSOR) 100 MG tablet, Take one tablet by mouth 2 hours prior to your CT (Patient not taking: Reported on 02/14/2019), Disp: 1 tablet, Rfl: 0 .  omeprazole (PRILOSEC) 40 MG capsule, Take 30- 60 min before your  first and last meals of the day (Patient taking differently: Take 40 mg by mouth 2 (two) times daily. Take 30- 60 min before your first and last meals of the day), Disp: 180 capsule, Rfl: 3 .  SYMBICORT 160-4.5 MCG/ACT inhaler, Inhale 2 puffs into the lungs 2 (two) times daily., Disp: 1 Inhaler, Rfl: 0 .  Tiotropium Bromide Monohydrate (SPIRIVA RESPIMAT) 2.5 MCG/ACT AERS, Inhale 2 puffs into the lungs daily., Disp: 1 Inhaler, Rfl: 2 .  traMADol (ULTRAM) 50 MG tablet, Take 1 tablet (50 mg total) by mouth every 6 (six) hours as needed for severe pain., Disp: 15 tablet, Rfl: 0  EXAM:  VITALS per patient if applicable:  GENERAL: alert, oriented, appears well and in no acute distress  HEENT: atraumatic, conjunttiva clear, no obvious abnormalities on inspection of external nose and ears  NECK: normal movements of the head and neck  LUNGS: on inspection no signs of respiratory distress, breathing rate appears normal, no obvious gross SOB, gasping or wheezing  CV: no obvious cyanosis  MS: moves all visible extremities without noticeable abnormality  PSYCH/NEURO: pleasant and cooperative, no obvious depression or anxiety, speech and thought processing grossly intact  ASSESSMENT AND PLAN:  Discussed the following assessment and plan:  Right lower quadrant abdominal pain  Constipation, unspecified constipation type  Pain has improved and is not constant at this point in time.  Likely current abdominal pain is from constipation.  Advised to take a stool softener and to stop tramadol.  He would like to be referred to gastroenterology so I will make this referral.  I am wondering if the pain he was experiencing was possibly from a kidney stone.  Is that I am sure he will feel better after he has a bowel movement, if pain persists after bowel movement then he will notify me.  Also believe his dark stool was likely from taking excessive pepto bismol    I discussed the assessment and treatment plan  with the patient. The patient was provided an opportunity to ask questions and all were answered. The patient agreed with the plan and demonstrated an understanding of the instructions.   The patient was advised to call back or seek an in-person evaluation if the symptoms worsen or if the condition fails to improve as anticipated.   [name], NP

## 2019-02-24 DIAGNOSIS — M25552 Pain in left hip: Secondary | ICD-10-CM | POA: Diagnosis not present

## 2019-03-01 ENCOUNTER — Encounter: Payer: Self-pay | Admitting: Internal Medicine

## 2019-03-04 ENCOUNTER — Ambulatory Visit: Payer: Medicare Other | Admitting: Internal Medicine

## 2019-03-09 DIAGNOSIS — M25552 Pain in left hip: Secondary | ICD-10-CM | POA: Diagnosis not present

## 2019-03-11 ENCOUNTER — Telehealth: Payer: Self-pay | Admitting: *Deleted

## 2019-03-11 NOTE — Telephone Encounter (Signed)
Covid-19 travel screening questions  Have you traveled in the last 14 days? If yes where? No Do you now or have you had a fever in the last 14 days? No Do you have any respiratory symptoms of shortness of breath or cough now or in the last 14 days? No Do you have any family members or close contacts with diagnosed or suspected Covid-19? No      

## 2019-03-14 ENCOUNTER — Other Ambulatory Visit: Payer: Self-pay

## 2019-03-14 ENCOUNTER — Ambulatory Visit (INDEPENDENT_AMBULATORY_CARE_PROVIDER_SITE_OTHER)
Admission: RE | Admit: 2019-03-14 | Discharge: 2019-03-14 | Disposition: A | Discharge status: Home / Self Care | Payer: Medicare Other | Source: Ambulatory Visit | Attending: Pulmonary Disease | Admitting: Pulmonary Disease

## 2019-03-14 DIAGNOSIS — R942 Abnormal results of pulmonary function studies: Secondary | ICD-10-CM | POA: Diagnosis not present

## 2019-03-14 DIAGNOSIS — R0602 Shortness of breath: Secondary | ICD-10-CM | POA: Diagnosis not present

## 2019-03-17 DIAGNOSIS — M24152 Other articular cartilage disorders, left hip: Secondary | ICD-10-CM | POA: Diagnosis not present

## 2019-03-17 DIAGNOSIS — M87852 Other osteonecrosis, left femur: Secondary | ICD-10-CM | POA: Diagnosis not present

## 2019-03-17 DIAGNOSIS — M7062 Trochanteric bursitis, left hip: Secondary | ICD-10-CM | POA: Diagnosis not present

## 2019-03-21 NOTE — Progress Notes (Signed)
Spoke with pt and notified of results per Dr. Wert. Pt verbalized understanding and denied any questions. 

## 2019-04-04 ENCOUNTER — Other Ambulatory Visit: Payer: Self-pay | Admitting: Adult Health

## 2019-04-15 ENCOUNTER — Other Ambulatory Visit: Payer: Self-pay | Admitting: *Deleted

## 2019-04-15 DIAGNOSIS — I10 Essential (primary) hypertension: Secondary | ICD-10-CM

## 2019-04-18 ENCOUNTER — Other Ambulatory Visit: Payer: Self-pay

## 2019-04-18 ENCOUNTER — Other Ambulatory Visit: Payer: Medicare Other | Admitting: *Deleted

## 2019-04-18 DIAGNOSIS — I10 Essential (primary) hypertension: Secondary | ICD-10-CM | POA: Diagnosis not present

## 2019-04-18 LAB — BASIC METABOLIC PANEL
BUN/Creatinine Ratio: 8 — ABNORMAL LOW (ref 10–24)
BUN: 10 mg/dL (ref 8–27)
CO2: 25 mmol/L (ref 20–29)
Calcium: 9.5 mg/dL (ref 8.6–10.2)
Chloride: 101 mmol/L (ref 96–106)
Creatinine, Ser: 1.21 mg/dL (ref 0.76–1.27)
GFR calc Af Amer: 73 mL/min/{1.73_m2} (ref >59)
GFR calc non Af Amer: 63 mL/min/{1.73_m2} (ref >59)
Glucose: 87 mg/dL (ref 65–99)
Potassium: 3.6 mmol/L (ref 3.5–5.2)
Sodium: 140 mmol/L (ref 134–144)

## 2019-04-21 ENCOUNTER — Telehealth (HOSPITAL_COMMUNITY): Payer: Self-pay | Admitting: Emergency Medicine

## 2019-04-21 NOTE — Telephone Encounter (Signed)
Reaching out to patient to offer assistance regarding upcoming cardiac imaging study; pt verbalizes understanding of appt date/time, parking situation and where to check in, pre-test NPO status and medications ordered, and verified current allergies; name and call back number provided for further questions should they arise Arjen Deringer RN Navigator Cardiac Imaging [facility] [phone] office [phone] cell  Pt denies covid symptoms, verbalized understanding of visitor policy. 

## 2019-04-22 ENCOUNTER — Ambulatory Visit (HOSPITAL_COMMUNITY)
Admission: RE | Admit: 2019-04-22 | Discharge: 2019-04-22 | Disposition: A | Discharge status: Home / Self Care | Payer: Medicare Other | Source: Ambulatory Visit | Attending: Interventional Cardiology | Admitting: Interventional Cardiology

## 2019-04-22 ENCOUNTER — Telehealth: Payer: Self-pay | Admitting: *Deleted

## 2019-04-22 ENCOUNTER — Other Ambulatory Visit: Payer: Self-pay

## 2019-04-22 DIAGNOSIS — I2510 Atherosclerotic heart disease of native coronary artery without angina pectoris: Secondary | ICD-10-CM

## 2019-04-22 DIAGNOSIS — R0602 Shortness of breath: Secondary | ICD-10-CM | POA: Insufficient documentation

## 2019-04-22 DIAGNOSIS — I2584 Coronary atherosclerosis due to calcified coronary lesion: Secondary | ICD-10-CM

## 2019-04-22 MED ORDER — METOPROLOL TARTRATE 5 MG/5ML IV SOLN
INTRAVENOUS | Status: AC
  Filled 2019-04-22: qty 10

## 2019-04-22 MED ORDER — METOPROLOL TARTRATE 5 MG/5ML IV SOLN
5.0000 mg | INTRAVENOUS | Status: DC | PRN
  Administered 2019-04-22 (×3): 5 mg via INTRAVENOUS
  Filled 2019-04-22: qty 5

## 2019-04-22 MED ORDER — NITROGLYCERIN 0.4 MG SL SUBL
0.8000 mg | SUBLINGUAL_TABLET | Freq: Once | SUBLINGUAL | Status: AC
  Administered 2019-04-22: 0.8 mg via SUBLINGUAL
  Filled 2019-04-22: qty 25

## 2019-04-22 MED ORDER — IOHEXOL 350 MG/ML SOLN
100.0000 mL | Freq: Once | INTRAVENOUS | Status: AC | PRN
  Administered 2019-04-22: 100 mL via INTRAVENOUS

## 2019-04-22 MED ORDER — NITROGLYCERIN 0.4 MG SL SUBL
SUBLINGUAL_TABLET | SUBLINGUAL | Status: AC
  Filled 2019-04-22: qty 2

## 2019-04-22 NOTE — Progress Notes (Signed)
CT complete. Patient denies any complaints. Offered patient snack and beverage.

## 2019-04-22 NOTE — Telephone Encounter (Signed)
Spoke with pt and wife and went over information.  Pt agreeable to appt on Monday.  Scheduled pt.  Screened pt and wife for COVID.  Wife is coming in with pt because he has difficulty with SOB and balance.    Both deny sx or recent exposure to anyone with COVID.

## 2019-04-22 NOTE — Telephone Encounter (Signed)
-----   Message from Belva Crome, MD sent at 04/22/2019  4:59 PM EDT ----- Regarding: ? Left Main disease Let the patient know that the preliminary CT findings suggest he will need a left heart cath next week. We need to see him Monday to gt it set up.

## 2019-04-24 NOTE — Progress Notes (Signed)
Cardiology Office Note:    Date:  04/25/2019   ID:  SINAI MAHANY, DOB 1956/03/01, MRN [id number]  PCP:  [name], NP  Cardiologist:  No primary care provider on file.   Referring MD: [name], NP   Chief Complaint  Patient presents with  . Coronary Artery Disease    History of Present Illness:    Chase Lloyd is a 63 y.o. male with a hx of  shortness of breath and recent diagnosis of acute systolic heart failure, COPD Gold 3, and CA calcification/LM disease on FFRCTA (04/2019).  The patient was seen several months ago to be evaluated for shortness of breath out of proportion to his degree of COPD.  He has difficulty ambulating and doing much around his home before becoming short of breath.  This all started progressing after he had back surgery in October 2019.  This was complicated by a large hematoma.  He had to have rehabilitation.  More recently he notices that he can barely do much of anything without severe shortness of breath.  He denies orthopnea.  He denies lower extremity swelling.  He was previously prescribed diuretic therapy but does not use it.  No prior history of heart disease.  He was noted to have coronary calcification on noncontrast chest CT.  Coronary CT with FFR was ordered and identified flow-limiting obstructive disease in the left main.  The left coronary system appears to be relatively small comparatively speaking.  His disease appears to be primarily isolated to the proximal and mid left main.  Coronary calcium score is greater than 1000.  He is here today for further recommendations.  We discussed coronary angiography.  In evaluating the patient, if he has significant left main disease he is probably not a good candidate for bypass surgery although this would need to be debated.  Past Medical History:  Diagnosis Date  . Back pain   . COPD (chronic obstructive pulmonary disease) ([facility])   . DDD (degenerative disc disease), lumbar   . Depression   .  Emphysema of lung ([place])   . Empyema ([place])    2016  (was in [place])  . GERD (gastroesophageal reflux disease)   . Hearing loss of right ear    started 2001.     microphone in right ear ,  and hearing aid in left  . Hodgkin's disease ([place]) 1999  . Hypotension   . Meniere's disease   . Pneumonia   . Tinnitus     Past Surgical History:  Procedure Laterality Date  . BACK SURGERY    . CERVICAL FUSION  2016  . ELBOW SURGERY    . EYE SURGERY    . IR THORACENTESIS ASP PLEURAL SPACE W/IMG GUIDE  10/08/2018  . LAMINECTOMY  1999  . lower back surgery    . TONSILLECTOMY      Current Medications: Current Meds  Medication Sig  . Albuterol Sulfate 108 (90 Base) MCG/ACT AEPB Inhale 1-2 puffs into the lungs every 6 (six) hours as needed (wheezing).   . furosemide (LASIX) 20 MG tablet Take 1 tablet (20 mg total) by mouth daily.  Marland Kitchen ibuprofen (ADVIL) 800 MG tablet TAKE 1 TABLET BY MOUTH EVERY 8 HOURS AS NEEDED  . omeprazole (PRILOSEC) 40 MG capsule Take 40 mg by mouth daily.  . SYMBICORT 160-4.5 MCG/ACT inhaler Inhale 2 puffs into the lungs 2 (two) times daily.  . Tiotropium Bromide Monohydrate (SPIRIVA RESPIMAT) 2.5 MCG/ACT AERS Inhale 2 puffs into the  lungs daily.     Allergies:   Patient has no known allergies.   Social History   Socioeconomic History  . Marital status: Married    Spouse name: Not on file  . Number of children: Not on file  . Years of education: Not on file  . Highest education level: Not on file  Occupational History  . Occupation: Retired  Scientific laboratory technician  . Financial resource strain: Not on file  . Food insecurity    Worry: Not on file    Inability: Not on file  . Transportation needs    Medical: Not on file    Non-medical: Not on file  Tobacco Use  . Smoking status: Former Smoker    Packs/day: 1.50    Years: 47.00    Pack years: 70.50    Types: Cigarettes    Quit date: 06/13/2018    Years since quitting: 0.8  . Smokeless tobacco: Never Used  Substance  and Sexual Activity  . Alcohol use: Yes    Comment: 24 cans in a week  . Drug use: No  . Sexual activity: Not on file  Lifestyle  . Physical activity    Days per week: Not on file    Minutes per session: Not on file  . Stress: Not on file  Relationships  . Social Herbalist on phone: Not on file    Gets together: Not on file    Attends religious service: Not on file    Active member of club or organization: Not on file    Attends meetings of clubs or organizations: Not on file    Relationship status: Not on file  Other Topics Concern  . Not on file  Social History Narrative   Retied - worked as Furniture conservator/restorer         Family History: The patient's family history includes Diabetes in his father; Hypertension in his father; Melanoma in his brother; Testicular cancer in his brother.  ROS:   Please see the history of present illness.    Known COPD with wheezing.  All other systems reviewed and are negative.  EKGs/Labs/Other Studies Reviewed:    The following studies were reviewed today:  shortness of breath and recent diagnosis of acute systolic heart failure referred for consultation by [name], NP  EKG:  EKG normal sinus rhythm, prominent voltage, small Q waves II, III, aVF, and V5 and V6.  Recent Labs: 05/31/2018: TSH 6.40 10/06/2018: B Natriuretic Peptide 65.7 11/10/2018: Magnesium 1.6 01/06/2019: NT-Pro BNP 239 02/14/2019: ALT 18; Hemoglobin 13.1; Platelets 150 04/18/2019: BUN 10; Creatinine, Ser 1.21; Potassium 3.6; Sodium 140  Recent Lipid Panel    Component Value Date/Time   CHOL 182 05/05/2018 0803   TRIG 153.0 (H) 05/05/2018 0803   HDL 60.50 05/05/2018 0803   CHOLHDL 3 05/05/2018 0803   VLDL 30.6 05/05/2018 0803   LDLCALC 91 05/05/2018 0803    Physical Exam:    VS:  BP (!) 142/64   Pulse 86   Ht 5\' 11"  (1.803 m)   Wt 194 lb 3.2 oz (88.1 kg)   SpO2 94%   BMI 27.09 kg/m     Wt Readings from Last 3 Encounters:  04/25/19 194 lb 3.2 oz (88.1 kg)   02/14/19 186 lb (84.4 kg)  01/06/19 191 lb 12.8 oz (87 kg)     GEN: Obese. No acute distress HEENT: Normal NECK: No JVD. LYMPHATICS: No lymphadenopathy CARDIAC: RRR.  No murmur, no gallop, no edema  VASCULAR: 2+ bilateral radial and 2+ bilateral femoral pulses, no bruits RESPIRATORY:  Clear to auscultation without rales, wheezing or rhonchi  ABDOMEN: Soft, non-tender, non-distended, No pulsatile mass, MUSCULOSKELETAL: No deformity  SKIN: Warm and dry NEUROLOGIC:  Alert and oriented x 3 PSYCHIATRIC:  Normal affect   ASSESSMENT:    1. Coronary artery disease involving native coronary artery of native heart with other form of angina pectoris ([place])   2. DOE (dyspnea on exertion)   3. Essential hypertension   4. COPD with acute exacerbation ([place])   5. Intermittent claudication ([facility])    PLAN:    In order of problems listed above:  1. Suspected ostial to mid body left main disease with CT anatomy demonstrating a dominant right coronary.  I have recommended coronary angiography to further define anatomy and help guide therapy.  He has significant COPD but will probably preclude open heart surgery.  I have recommended that he undergo a left and right heart cath to define pulmonary pressures.  Further recommendations will be depending upon verification of left main disease and heart team approach relative to management in his particular clinical situation. 2. There is a chance that dyspnea on exertion has an ischemic component.  Angiography will help to define.  Right heart cath will also help rule out diastolic heart failure.  Recent echocardiogram within the past 12 months demonstrated normal LV systolic function. 3. The patient is on diuretic therapy but has not used it recently.  This could be contributing to dyspnea.  Without diuretic therapy here in the office today his blood pressure is mildly elevated on the systolic side. 4. COPD the gold 3. 5. Not addressed  The patient was  counseled to undergo left and right heart catheterization, coronary angiography, and possible percutaneous coronary intervention with stent implantation. The procedural risks and benefits were discussed in detail. The risks discussed included death, stroke, myocardial infarction, life-threatening bleeding, limb ischemia, kidney injury, allergy, and possible emergency cardiac surgery. The risk of these significant complications were estimated to occur less than 1% of the time. After discussion, the patient has agreed to proceed.  Right heart cath will help guide whether or not diuretic therapy to help manage diastolic heart failure would be helpful.    Medication Adjustments/Labs and Tests Ordered: Current medicines are reviewed at length with the patient today.  Concerns regarding medicines are outlined above.  Orders Placed This Encounter  Procedures  . Basic metabolic panel  . CBC  . EKG 12-Lead   No orders of the defined types were placed in this encounter.   Patient Instructions  Medication Instructions:  Your physician recommends that you continue on your current medications as directed. Please refer to the Current Medication list given to you today.  If you need a refill on your cardiac medications before your next appointment, please call your pharmacy.   Lab work: BMET, CBC today  If you have labs (blood work) drawn today and your tests are completely normal, you will receive your results only by: Marland Kitchen MyChart Message (if you have MyChart) OR . A paper copy in the mail If you have any lab test that is abnormal or we need to change your treatment, we will call you to review the results.  Testing/Procedures: Your physician has requested that you have a cardiac catheterization. Cardiac catheterization is used to diagnose and/or treat various heart conditions. Doctors may recommend this procedure for a number of different reasons. The most common reason is to evaluate chest  pain.  Chest pain can be a symptom of coronary artery disease (CAD), and cardiac catheterization can show whether plaque is narrowing or blocking your heart's arteries. This procedure is also used to evaluate the valves, as well as measure the blood flow and oxygen levels in different parts of your heart. For further information please visit [email or web address]. Please follow instruction sheet, as given.   Follow-Up: At [facility], you and your health needs are our priority.  As part of our continuing mission to provide you with exceptional heart care, we have created designated Provider Care Teams.  These Care Teams include your primary Cardiologist (physician) and Advanced Practice Providers (APPs -  Physician Assistants and Nurse Practitioners) who all work together to provide you with the care you need, when you need it. You will need a follow up appointment in 2-3 weeks.  Please call our office 2 months in advance to schedule this appointment.  You may see Dr. [name] or one of the following Advanced Practice Providers on your designated Care Team:   Truitt Merle, NP Cecilie Kicks, NP . Kathyrn Drown, NP  Any Other Special Instructions Will Be Listed Below (If Applicable).      [facility] [place], [place] [address] [address] Dept: [phone] Loc: [place]  04/25/2019  You are scheduled for a Cardiac Catheterization on Friday, June 26 with Dr. Shelva Majestic.  1. Please arrive at the [facility] (Main Entrance A) at [facility]: [address] [place], [place] [address] at 7:00 AM (This time is two hours before your procedure to ensure your preparation). Free valet parking service is available.   Special note: Every effort is made to have your procedure done on time. Please understand that emergencies sometimes delay scheduled procedures.  2. Diet: Do not eat solid  foods after midnight.  The patient may have clear liquids until 5am upon the day of the procedure.  3. Labs: You had labs drawn today  4. Medication instructions in preparation for your procedure:   Contrast Allergy: No   DO NOT TAKE YOUR FUROSEMIDE THE MORNING OF YOUR TEST.   On the morning of your procedure, take your Aspirin and any morning medicines NOT listed above.  You may use sips of water.  5. Plan for one night stay--bring personal belongings. 6. Bring a current list of your medications and current insurance cards. 7. You MUST have a responsible person to drive you home. 8. Someone MUST be with you the first 24 hours after you arrive home or your discharge will be delayed. 9. Please wear clothes that are easy to get on and off and wear slip-on shoes.  Thank you for allowing [name] to care for you!   -- [facility]       Signed, Sinclair Grooms, MD  04/25/2019 5:22 PM    [place]

## 2019-04-24 NOTE — H&P (View-Only) (Signed)
Cardiology Office Note:    Date:  04/25/2019   ID:  Chase Lloyd, DOB 1956/11/01, MRN [id number]  PCP:  [name], NP  Cardiologist:  No primary care provider on file.   Referring MD: [name], NP   Chief Complaint  Patient presents with  . Coronary Artery Disease    History of Present Illness:    Chase Lloyd is a 62 y.o. male with a hx of  shortness of breath and recent diagnosis of acute systolic heart failure, COPD Gold 3, and CA calcification/LM disease on FFRCTA (04/2019).  The patient was seen several months ago to be evaluated for shortness of breath out of proportion to his degree of COPD.  He has difficulty ambulating and doing much around his home before becoming short of breath.  This all started progressing after he had back surgery in October 2019.  This was complicated by a large hematoma.  He had to have rehabilitation.  More recently he notices that he can barely do much of anything without severe shortness of breath.  He denies orthopnea.  He denies lower extremity swelling.  He was previously prescribed diuretic therapy but does not use it.  No prior history of heart disease.  He was noted to have coronary calcification on noncontrast chest CT.  Coronary CT with FFR was ordered and identified flow-limiting obstructive disease in the left main.  The left coronary system appears to be relatively small comparatively speaking.  His disease appears to be primarily isolated to the proximal and mid left main.  Coronary calcium score is greater than 1000.  He is here today for further recommendations.  We discussed coronary angiography.  In evaluating the patient, if he has significant left main disease he is probably not a good candidate for bypass surgery although this would need to be debated.  Past Medical History:  Diagnosis Date  . Back pain   . COPD (chronic obstructive pulmonary disease) ([place])   . DDD (degenerative disc disease), lumbar   . Depression   .  Emphysema of lung ([place])   . Empyema ([place])    2016  (was in [place])  . GERD (gastroesophageal reflux disease)   . Hearing loss of right ear    started 2001.     microphone in right ear ,  and hearing aid in left  . Hodgkin's disease ([place]) 1999  . Hypotension   . Meniere's disease   . Pneumonia   . Tinnitus     Past Surgical History:  Procedure Laterality Date  . BACK SURGERY    . CERVICAL FUSION  2016  . ELBOW SURGERY    . EYE SURGERY    . IR THORACENTESIS ASP PLEURAL SPACE W/IMG GUIDE  10/08/2018  . LAMINECTOMY  1999  . lower back surgery    . TONSILLECTOMY      Current Medications: Current Meds  Medication Sig  . Albuterol Sulfate 108 (90 Base) MCG/ACT AEPB Inhale 1-2 puffs into the lungs every 6 (six) hours as needed (wheezing).   . furosemide (LASIX) 20 MG tablet Take 1 tablet (20 mg total) by mouth daily.  Marland Kitchen ibuprofen (ADVIL) 800 MG tablet TAKE 1 TABLET BY MOUTH EVERY 8 HOURS AS NEEDED  . omeprazole (PRILOSEC) 40 MG capsule Take 40 mg by mouth daily.  . SYMBICORT 160-4.5 MCG/ACT inhaler Inhale 2 puffs into the lungs 2 (two) times daily.  . Tiotropium Bromide Monohydrate (SPIRIVA RESPIMAT) 2.5 MCG/ACT AERS Inhale 2 puffs into the  lungs daily.     Allergies:   Patient has no known allergies.   Social History   Socioeconomic History  . Marital status: Married    Spouse name: Not on file  . Number of children: Not on file  . Years of education: Not on file  . Highest education level: Not on file  Occupational History  . Occupation: Retired  Scientific laboratory technician  . Financial resource strain: Not on file  . Food insecurity    Worry: Not on file    Inability: Not on file  . Transportation needs    Medical: Not on file    Non-medical: Not on file  Tobacco Use  . Smoking status: Former Smoker    Packs/day: 1.50    Years: 47.00    Pack years: 70.50    Types: Cigarettes    Quit date: 06/13/2018    Years since quitting: 0.8  . Smokeless tobacco: Never Used  Substance  and Sexual Activity  . Alcohol use: Yes    Comment: 24 cans in a week  . Drug use: No  . Sexual activity: Not on file  Lifestyle  . Physical activity    Days per week: Not on file    Minutes per session: Not on file  . Stress: Not on file  Relationships  . Social Herbalist on phone: Not on file    Gets together: Not on file    Attends religious service: Not on file    Active member of club or organization: Not on file    Attends meetings of clubs or organizations: Not on file    Relationship status: Not on file  Other Topics Concern  . Not on file  Social History Narrative   Retied - worked as Furniture conservator/restorer         Family History: The patient's family history includes Diabetes in his father; Hypertension in his father; Melanoma in his brother; Testicular cancer in his brother.  ROS:   Please see the history of present illness.    Known COPD with wheezing.  All other systems reviewed and are negative.  EKGs/Labs/Other Studies Reviewed:    The following studies were reviewed today:  shortness of breath and recent diagnosis of acute systolic heart failure referred for consultation by [name], NP  EKG:  EKG normal sinus rhythm, prominent voltage, small Q waves II, III, aVF, and V5 and V6.  Recent Labs: 05/31/2018: TSH 6.40 10/06/2018: B Natriuretic Peptide 65.7 11/10/2018: Magnesium 1.6 01/06/2019: NT-Pro BNP 239 02/14/2019: ALT 18; Hemoglobin 13.1; Platelets 150 04/18/2019: BUN 10; Creatinine, Ser 1.21; Potassium 3.6; Sodium 140  Recent Lipid Panel    Component Value Date/Time   CHOL 182 05/05/2018 0803   TRIG 153.0 (H) 05/05/2018 0803   HDL 60.50 05/05/2018 0803   CHOLHDL 3 05/05/2018 0803   VLDL 30.6 05/05/2018 0803   LDLCALC 91 05/05/2018 0803    Physical Exam:    VS:  BP (!) 142/64   Pulse 86   Ht 5\' 11"  (1.803 m)   Wt 194 lb 3.2 oz (88.1 kg)   SpO2 94%   BMI 27.09 kg/m     Wt Readings from Last 3 Encounters:  04/25/19 194 lb 3.2 oz (88.1 kg)   02/14/19 186 lb (84.4 kg)  01/06/19 191 lb 12.8 oz (87 kg)     GEN: Obese. No acute distress HEENT: Normal NECK: No JVD. LYMPHATICS: No lymphadenopathy CARDIAC: RRR.  No murmur, no gallop, no edema  VASCULAR: 2+ bilateral radial and 2+ bilateral femoral pulses, no bruits RESPIRATORY:  Clear to auscultation without rales, wheezing or rhonchi  ABDOMEN: Soft, non-tender, non-distended, No pulsatile mass, MUSCULOSKELETAL: No deformity  SKIN: Warm and dry NEUROLOGIC:  Alert and oriented x 3 PSYCHIATRIC:  Normal affect   ASSESSMENT:    1. Coronary artery disease involving native coronary artery of native heart with other form of angina pectoris ([place])   2. DOE (dyspnea on exertion)   3. Essential hypertension   4. COPD with acute exacerbation ([place])   5. Intermittent claudication (H[facility])    PLAN:    In order of problems listed above:  1. Suspected ostial to mid body left main disease with CT anatomy demonstrating a dominant right coronary.  I have recommended coronary angiography to further define anatomy and help guide therapy.  He has significant COPD but will probably preclude open heart surgery.  I have recommended that he undergo a left and right heart cath to define pulmonary pressures.  Further recommendations will be depending upon verification of left main disease and heart team approach relative to management in his particular clinical situation. 2. There is a chance that dyspnea on exertion has an ischemic component.  Angiography will help to define.  Right heart cath will also help rule out diastolic heart failure.  Recent echocardiogram within the past 12 months demonstrated normal LV systolic function. 3. The patient is on diuretic therapy but has not used it recently.  This could be contributing to dyspnea.  Without diuretic therapy here in the office today his blood pressure is mildly elevated on the systolic side. 4. COPD the gold 3. 5. Not addressed  The patient was  counseled to undergo left and right heart catheterization, coronary angiography, and possible percutaneous coronary intervention with stent implantation. The procedural risks and benefits were discussed in detail. The risks discussed included death, stroke, myocardial infarction, life-threatening bleeding, limb ischemia, kidney injury, allergy, and possible emergency cardiac surgery. The risk of these significant complications were estimated to occur less than 1% of the time. After discussion, the patient has agreed to proceed.  Right heart cath will help guide whether or not diuretic therapy to help manage diastolic heart failure would be helpful.    Medication Adjustments/Labs and Tests Ordered: Current medicines are reviewed at length with the patient today.  Concerns regarding medicines are outlined above.  Orders Placed This Encounter  Procedures  . Basic metabolic panel  . CBC  . EKG 12-Lead   No orders of the defined types were placed in this encounter.   Patient Instructions  Medication Instructions:  Your physician recommends that you continue on your current medications as directed. Please refer to the Current Medication list given to you today.  If you need a refill on your cardiac medications before your next appointment, please call your pharmacy.   Lab work: BMET, CBC today  If you have labs (blood work) drawn today and your tests are completely normal, you will receive your results only by: Marland Kitchen MyChart Message (if you have MyChart) OR . A paper copy in the mail If you have any lab test that is abnormal or we need to change your treatment, we will call you to review the results.  Testing/Procedures: Your physician has requested that you have a cardiac catheterization. Cardiac catheterization is used to diagnose and/or treat various heart conditions. Doctors may recommend this procedure for a number of different reasons. The most common reason is to evaluate chest  pain.  Chest pain can be a symptom of coronary artery disease (CAD), and cardiac catheterization can show whether plaque is narrowing or blocking your heart's arteries. This procedure is also used to evaluate the valves, as well as measure the blood flow and oxygen levels in different parts of your heart. For further information please visit [facility]uge[facility]iesta.tn. Please follow instruction sheet, as given.   Follow-Up: At [facility], you and your health needs are our priority.  As part of our continuing mission to provide you with exceptional heart care, we have created designated Provider Care Teams.  These Care Teams include your primary Cardiologist (physician) and Advanced Practice Providers (APPs -  Physician Assistants and Nurse Practitioners) who all work together to provide you with the care you need, when you need it. You will need a follow up appointment in 2-3 weeks.  Please call our office 2 months in advance to schedule this appointment.  You may see Dr. [name] or one of the following Advanced Practice Providers on your designated Care Team:   Truitt Merle, NP Cecilie Kicks, NP . Kathyrn Drown, NP  Any Other Special Instructions Will Be Listed Below (If Applicable).      [facility] OFFICE [place], [place] [address] Dept: [phone] Loc: [place]  04/25/2019  You are scheduled for a Cardiac Catheterization on Friday, June 26 with Dr. Shelva Majestic.  1. Please arrive at the [facility] (Main Entrance A) at [facility]: [address] [place],  [address] at 7:00 AM (This time is two hours before your procedure to ensure your preparation). Free valet parking service is available.   Special note: Every effort is made to have your procedure done on time. Please understand that emergencies sometimes delay scheduled procedures.  2. Diet: Do not eat solid  foods after midnight.  The patient may have clear liquids until 5am upon the day of the procedure.  3. Labs: You had labs drawn today  4. Medication instructions in preparation for your procedure:   Contrast Allergy: No   DO NOT TAKE YOUR FUROSEMIDE THE MORNING OF YOUR TEST.   On the morning of your procedure, take your Aspirin and any morning medicines NOT listed above.  You may use sips of water.  5. Plan for one night stay--bring personal belongings. 6. Bring a current list of your medications and current insurance cards. 7. You MUST have a responsible person to drive you home. 8. Someone MUST be with you the first 24 hours after you arrive home or your discharge will be delayed. 9. Please wear clothes that are easy to get on and off and wear slip-on shoes.  Thank you for allowing [name] to care for you!   -- [place] [facility]       Signed, Sinclair Grooms, MD  04/25/2019 5:22 PM    [place]

## 2019-04-25 ENCOUNTER — Ambulatory Visit (INDEPENDENT_AMBULATORY_CARE_PROVIDER_SITE_OTHER): Payer: Medicare Other | Admitting: Interventional Cardiology

## 2019-04-25 ENCOUNTER — Encounter: Payer: Self-pay | Admitting: Interventional Cardiology

## 2019-04-25 ENCOUNTER — Other Ambulatory Visit: Payer: Self-pay

## 2019-04-25 VITALS — BP 142/64 | HR 86 | Ht 71.0 in | Wt 194.2 lb

## 2019-04-25 DIAGNOSIS — I25118 Atherosclerotic heart disease of native coronary artery with other forms of angina pectoris: Secondary | ICD-10-CM

## 2019-04-25 DIAGNOSIS — J441 Chronic obstructive pulmonary disease with (acute) exacerbation: Secondary | ICD-10-CM

## 2019-04-25 DIAGNOSIS — I2510 Atherosclerotic heart disease of native coronary artery without angina pectoris: Secondary | ICD-10-CM

## 2019-04-25 DIAGNOSIS — I2584 Coronary atherosclerosis due to calcified coronary lesion: Secondary | ICD-10-CM

## 2019-04-25 DIAGNOSIS — I739 Peripheral vascular disease, unspecified: Secondary | ICD-10-CM | POA: Diagnosis not present

## 2019-04-25 DIAGNOSIS — R0609 Other forms of dyspnea: Secondary | ICD-10-CM

## 2019-04-25 DIAGNOSIS — I10 Essential (primary) hypertension: Secondary | ICD-10-CM

## 2019-04-25 NOTE — Patient Instructions (Signed)
Medication Instructions:  Your physician recommends that you continue on your current medications as directed. Please refer to the Current Medication list given to you today.  If you need a refill on your cardiac medications before your next appointment, please call your pharmacy.   Lab work: BMET, CBC today  If you have labs (blood work) drawn today and your tests are completely normal, you will receive your results only by:  [place] (if you have MyChart) OR  A paper copy in the mail If you have any lab test that is abnormal or we need to change your treatment, we will call you to review the results.  Testing/Procedures: Your physician has requested that you have a cardiac catheterization. Cardiac catheterization is used to diagnose and/or treat various heart conditions. Doctors may recommend this procedure for a number of different reasons. The most common reason is to evaluate chest pain. Chest pain can be a symptom of coronary artery disease (CAD), and cardiac catheterization can show whether plaque is narrowing or blocking your hearts arteries. This procedure is also used to evaluate the valves, as well as measure the blood flow and oxygen levels in different parts of your heart. For further information please visit HugeFiesta.tn. Please follow instruction sheet, as given.   Follow-Up: At [facility], you and your health needs are our priority.  As part of our continuing mission to provide you with exceptional heart care, we have created designated Provider Care Teams.  These Care Teams include your primary Cardiologist (physician) and Advanced Practice Providers (APPs -  Physician Assistants and Nurse Practitioners) who all work together to provide you with the care you need, when you need it. You will need a follow up appointment in 2-3 weeks.  Please call our office 2 months in advance to schedule this appointment.  You may see Dr. [name] or one of the following Advanced  Practice Providers on your designated Care Team:   Truitt Merle, NP Cecilie Kicks, NP  Kathyrn Drown, NP  Any Other Special Instructions Will Be Listed Below (If Applicable).      [facility] OFFICE [place], [place] [address] Dept: [phone] Loc: [place]  04/25/2019  You are scheduled for a Cardiac Catheterization on Friday, June 26 with Dr. Shelva Majestic.  1. Please arrive at the [facility] (Main Entrance A) at [facility]: [address] [place], [place] [address] at 7:00 AM (This time is two hours before your procedure to ensure your preparation). Free valet parking service is available.   Special note: Every effort is made to have your procedure done on time. Please understand that emergencies sometimes delay scheduled procedures.  2. Diet: Do not eat solid foods after midnight.  The patient may have clear liquids until 5am upon the day of the procedure.  3. Labs: You had labs drawn today  4. Medication instructions in preparation for your procedure:   Contrast Allergy: No   DO NOT TAKE YOUR FUROSEMIDE THE MORNING OF YOUR TEST.   On the morning of your procedure, take your Aspirin and any morning medicines NOT listed above.  You may use sips of water.  5. Plan for one night stay--bring personal belongings. 6. Bring a current list of your medications and current insurance cards. 7. You MUST have a responsible person to drive you home. 8. Someone MUST be with you the first 24 hours after  you arrive home or your discharge will be delayed. 9. Please wear clothes that are easy to get on and off and wear slip-on shoes.  Thank you for allowing [name] to care for you!   -- [place] [facility]

## 2019-04-26 ENCOUNTER — Other Ambulatory Visit (HOSPITAL_COMMUNITY)
Admission: RE | Admit: 2019-04-26 | Discharge: 2019-04-26 | Disposition: A | Discharge status: Home / Self Care | Payer: Medicare Other | Source: Ambulatory Visit | Attending: Cardiovascular Disease | Admitting: Cardiovascular Disease

## 2019-04-26 DIAGNOSIS — Z1159 Encounter for screening for other viral diseases: Secondary | ICD-10-CM | POA: Insufficient documentation

## 2019-04-26 LAB — CBC
Hematocrit: 39.2 % (ref 37.5–51.0)
Hemoglobin: 13.4 g/dL (ref 13.0–17.7)
MCH: 32.4 pg (ref 26.6–33.0)
MCHC: 34.2 g/dL (ref 31.5–35.7)
MCV: 95 fL (ref 79–97)
Platelets: 154 10*3/uL (ref 150–450)
RBC: 4.14 x10E6/uL (ref 4.14–5.80)
RDW: 14.4 % (ref 11.6–15.4)
WBC: 4 10*3/uL (ref 3.4–10.8)

## 2019-04-26 LAB — BASIC METABOLIC PANEL
BUN/Creatinine Ratio: 11 (ref 10–24)
BUN: 11 mg/dL (ref 8–27)
CO2: 25 mmol/L (ref 20–29)
Calcium: 9.2 mg/dL (ref 8.6–10.2)
Chloride: 100 mmol/L (ref 96–106)
Creatinine, Ser: 0.99 mg/dL (ref 0.76–1.27)
GFR calc Af Amer: 93 mL/min/{1.73_m2} (ref >59)
GFR calc non Af Amer: 80 mL/min/{1.73_m2} (ref >59)
Glucose: 103 mg/dL — ABNORMAL HIGH (ref 65–99)
Potassium: 4.3 mmol/L (ref 3.5–5.2)
Sodium: 139 mmol/L (ref 134–144)

## 2019-04-28 ENCOUNTER — Telehealth: Payer: Self-pay | Admitting: *Deleted

## 2019-04-28 LAB — NOVEL CORONAVIRUS, NAA (HOSP ORDER, SEND-OUT TO REF LAB; TAT 18-24 HRS): SARS-CoV-2, NAA: NOT DETECTED

## 2019-04-28 NOTE — Telephone Encounter (Signed)
Pt contacted pre-catheterization scheduled at [facility] for: Friday April 29, 2019 9 AM Verified arrival time and place: [place] Entrance A at: 7 AM  Covid-19 test date:04/26/19  No solid food after midnight prior to cath, clear liquids until 5 AM day of procedure. Contrast allergy: no  Hold: Furosemide-AM of procedure.  Except hold medications AM meds can be  taken pre-cath with sip of water including: ASA 81 mg   Confirmed patient has responsible person to drive home post procedure and observe 24 hours after arriving home: yes  Due to Covid-19 pandemic no visitors are allowed in the hospital (unless cognitive impairment).  Their designated party will be called when their procedure is over for an update and to arrange pick up.  Patients are required to wear a mask when they enter the hospital.      COVID-19 Pre-Screening Questions:  . In the past 7 to 10 days have you had a cough,  shortness of breath, headache, congestion, fever (100 or greater) body aches, chills, sore throat, or sudden loss of taste or sense of smell? no . Have you been around anyone with known Covid 19? no . Have you been around anyone who is awaiting Covid 19 test results in the past 7 to 10 days? no . Have you been around anyone who has been exposed to Covid 19, or has mentioned symptoms of Covid 19 within the past 7 to 10 days? no   I reviewed procedure instructions/mask/visitor/Covid-19 screening questions with patient, he verbalized understanding, thanked me for call.

## 2019-04-29 ENCOUNTER — Encounter (HOSPITAL_COMMUNITY)
Admission: RE | Disposition: A | Discharge status: Home / Self Care | Payer: Self-pay | Source: Home / Self Care | Attending: Cardiovascular Disease

## 2019-04-29 ENCOUNTER — Other Ambulatory Visit: Payer: Self-pay

## 2019-04-29 ENCOUNTER — Ambulatory Visit (HOSPITAL_COMMUNITY)
Admission: RE | Admit: 2019-04-29 | Discharge: 2019-04-29 | Disposition: A | Discharge status: Home / Self Care | Payer: Medicare Other | Attending: Cardiovascular Disease | Admitting: Cardiovascular Disease

## 2019-04-29 DIAGNOSIS — Z7951 Long term (current) use of inhaled steroids: Secondary | ICD-10-CM | POA: Diagnosis not present

## 2019-04-29 DIAGNOSIS — Z8571 Personal history of Hodgkin lymphoma: Secondary | ICD-10-CM | POA: Diagnosis not present

## 2019-04-29 DIAGNOSIS — R0609 Other forms of dyspnea: Secondary | ICD-10-CM | POA: Diagnosis not present

## 2019-04-29 DIAGNOSIS — Z8249 Family history of ischemic heart disease and other diseases of the circulatory system: Secondary | ICD-10-CM | POA: Diagnosis not present

## 2019-04-29 DIAGNOSIS — J449 Chronic obstructive pulmonary disease, unspecified: Secondary | ICD-10-CM | POA: Diagnosis not present

## 2019-04-29 DIAGNOSIS — I110 Hypertensive heart disease with heart failure: Secondary | ICD-10-CM | POA: Insufficient documentation

## 2019-04-29 DIAGNOSIS — I5021 Acute systolic (congestive) heart failure: Secondary | ICD-10-CM | POA: Insufficient documentation

## 2019-04-29 DIAGNOSIS — F329 Major depressive disorder, single episode, unspecified: Secondary | ICD-10-CM | POA: Insufficient documentation

## 2019-04-29 DIAGNOSIS — I25119 Atherosclerotic heart disease of native coronary artery with unspecified angina pectoris: Secondary | ICD-10-CM | POA: Insufficient documentation

## 2019-04-29 DIAGNOSIS — I2510 Atherosclerotic heart disease of native coronary artery without angina pectoris: Secondary | ICD-10-CM | POA: Diagnosis not present

## 2019-04-29 DIAGNOSIS — H8109 Meniere's disease, unspecified ear: Secondary | ICD-10-CM | POA: Insufficient documentation

## 2019-04-29 DIAGNOSIS — K219 Gastro-esophageal reflux disease without esophagitis: Secondary | ICD-10-CM | POA: Diagnosis not present

## 2019-04-29 DIAGNOSIS — Z87891 Personal history of nicotine dependence: Secondary | ICD-10-CM | POA: Insufficient documentation

## 2019-04-29 DIAGNOSIS — Z791 Long term (current) use of non-steroidal anti-inflammatories (NSAID): Secondary | ICD-10-CM | POA: Diagnosis not present

## 2019-04-29 DIAGNOSIS — I739 Peripheral vascular disease, unspecified: Secondary | ICD-10-CM | POA: Diagnosis not present

## 2019-04-29 DIAGNOSIS — R931 Abnormal findings on diagnostic imaging of heart and coronary circulation: Secondary | ICD-10-CM

## 2019-04-29 DIAGNOSIS — Z79899 Other long term (current) drug therapy: Secondary | ICD-10-CM | POA: Diagnosis not present

## 2019-04-29 HISTORY — PX: RIGHT/LEFT HEART CATH AND CORONARY ANGIOGRAPHY: CATH118266

## 2019-04-29 LAB — POCT I-STAT 7, (LYTES, BLD GAS, ICA,H+H)
Acid-Base Excess: 2 mmol/L (ref 0.0–2.0)
Bicarbonate: 29.2 mmol/L — ABNORMAL HIGH (ref 20.0–28.0)
Calcium, Ion: 1.26 mmol/L (ref 1.15–1.40)
HCT: 33 % — ABNORMAL LOW (ref 39.0–52.0)
Hemoglobin: 11.2 g/dL — ABNORMAL LOW (ref 13.0–17.0)
O2 Saturation: 100 %
Potassium: 3.5 mmol/L (ref 3.5–5.1)
Sodium: 137 mmol/L (ref 135–145)
TCO2: 31 mmol/L (ref 22–32)
pCO2 arterial: 54.3 mmHg — ABNORMAL HIGH (ref 32.0–48.0)
pH, Arterial: 7.338 — ABNORMAL LOW (ref 7.350–7.450)
pO2, Arterial: 181 mmHg — ABNORMAL HIGH (ref 83.0–108.0)

## 2019-04-29 LAB — POCT I-STAT EG7
Acid-Base Excess: 3 mmol/L — ABNORMAL HIGH (ref 0.0–2.0)
Acid-Base Excess: 4 mmol/L — ABNORMAL HIGH (ref 0.0–2.0)
Bicarbonate: 30 mmol/L — ABNORMAL HIGH (ref 20.0–28.0)
Bicarbonate: 31 mmol/L — ABNORMAL HIGH (ref 20.0–28.0)
Calcium, Ion: 1.23 mmol/L (ref 1.15–1.40)
Calcium, Ion: 1.28 mmol/L (ref 1.15–1.40)
HCT: 35 % — ABNORMAL LOW (ref 39.0–52.0)
HCT: 36 % — ABNORMAL LOW (ref 39.0–52.0)
Hemoglobin: 11.9 g/dL — ABNORMAL LOW (ref 13.0–17.0)
Hemoglobin: 12.2 g/dL — ABNORMAL LOW (ref 13.0–17.0)
O2 Saturation: 73 %
O2 Saturation: 72 %
Potassium: 3.7 mmol/L (ref 3.5–5.1)
Potassium: 3.8 mmol/L (ref 3.5–5.1)
Sodium: 140 mmol/L (ref 135–145)
Sodium: 139 mmol/L (ref 135–145)
TCO2: 32 mmol/L (ref 22–32)
TCO2: 33 mmol/L — ABNORMAL HIGH (ref 22–32)
pCO2, Ven: 55.2 mmHg (ref 44.0–60.0)
pCO2, Ven: 56 mmHg (ref 44.0–60.0)
pH, Ven: 7.343 (ref 7.250–7.430)
pH, Ven: 7.35 (ref 7.250–7.430)
pO2, Ven: 42 mmHg (ref 32.0–45.0)
pO2, Ven: 41 mmHg (ref 32.0–45.0)

## 2019-04-29 SURGERY — RIGHT/LEFT HEART CATH AND CORONARY ANGIOGRAPHY
Anesthesia: LOCAL

## 2019-04-29 MED ORDER — SODIUM CHLORIDE 0.9 % IV SOLN: 250.0000 mL | INTRAVENOUS | Status: DC | PRN

## 2019-04-29 MED ORDER — HYDRALAZINE HCL 20 MG/ML IJ SOLN: 10.0000 mg | INTRAMUSCULAR | Status: DC | PRN

## 2019-04-29 MED ORDER — HEPARIN (PORCINE) IN NACL 1000-0.9 UT/500ML-% IV SOLN
INTRAVENOUS | Status: DC | PRN
  Administered 2019-04-29 (×2): 500 mL

## 2019-04-29 MED ORDER — SODIUM CHLORIDE 0.9% FLUSH: 3.0000 mL | Freq: Two times a day (BID) | INTRAVENOUS | Status: DC

## 2019-04-29 MED ORDER — VERAPAMIL HCL 2.5 MG/ML IV SOLN
INTRAVENOUS | Status: DC | PRN
  Administered 2019-04-29: 10 mL via INTRA_ARTERIAL

## 2019-04-29 MED ORDER — VERAPAMIL HCL 2.5 MG/ML IV SOLN
INTRAVENOUS | Status: AC
  Filled 2019-04-29: qty 2

## 2019-04-29 MED ORDER — LIDOCAINE HCL (PF) 1 % IJ SOLN
INTRAMUSCULAR | Status: DC | PRN
  Administered 2019-04-29 (×2): 2 mL via INTRADERMAL

## 2019-04-29 MED ORDER — SODIUM CHLORIDE 0.9 % WEIGHT BASED INFUSION: 1.0000 mL/kg/h | INTRAVENOUS | Status: DC

## 2019-04-29 MED ORDER — HEPARIN SODIUM (PORCINE) 1000 UNIT/ML IJ SOLN
INTRAMUSCULAR | Status: DC | PRN
  Administered 2019-04-29: 4500 [IU] via INTRAVENOUS

## 2019-04-29 MED ORDER — HEPARIN (PORCINE) IN NACL 1000-0.9 UT/500ML-% IV SOLN
INTRAVENOUS | Status: AC
  Filled 2019-04-29: qty 1000

## 2019-04-29 MED ORDER — FENTANYL CITRATE (PF) 100 MCG/2ML IJ SOLN
INTRAMUSCULAR | Status: AC
  Filled 2019-04-29: qty 2

## 2019-04-29 MED ORDER — METOPROLOL TARTRATE 25 MG PO TABS: 12.5000 mg | ORAL_TABLET | Freq: Two times a day (BID) | ORAL | 6 refills | Status: DC

## 2019-04-29 MED ORDER — MIDAZOLAM HCL 2 MG/2ML IJ SOLN
INTRAMUSCULAR | Status: AC
  Filled 2019-04-29: qty 2

## 2019-04-29 MED ORDER — SODIUM CHLORIDE 0.9% FLUSH: 3.0000 mL | INTRAVENOUS | Status: DC | PRN

## 2019-04-29 MED ORDER — HEPARIN SODIUM (PORCINE) 1000 UNIT/ML IJ SOLN
INTRAMUSCULAR | Status: AC
  Filled 2019-04-29: qty 1

## 2019-04-29 MED ORDER — NITROGLYCERIN 1 MG/10 ML FOR IR/CATH LAB
INTRA_ARTERIAL | Status: DC | PRN
  Administered 2019-04-29: 200 ug via INTRACORONARY

## 2019-04-29 MED ORDER — ASPIRIN 81 MG PO CHEW: 81.0000 mg | CHEWABLE_TABLET | ORAL | Status: DC

## 2019-04-29 MED ORDER — NITROGLYCERIN 1 MG/10 ML FOR IR/CATH LAB
INTRA_ARTERIAL | Status: AC
  Filled 2019-04-29: qty 10

## 2019-04-29 MED ORDER — LIDOCAINE HCL (PF) 1 % IJ SOLN
INTRAMUSCULAR | Status: AC
  Filled 2019-04-29: qty 30

## 2019-04-29 MED ORDER — ISOSORBIDE MONONITRATE ER 30 MG PO TB24
30.0000 mg | ORAL_TABLET | Freq: Every day | ORAL | Status: DC
  Administered 2019-04-29: 30 mg via ORAL
  Filled 2019-04-29: qty 1

## 2019-04-29 MED ORDER — FENTANYL CITRATE (PF) 100 MCG/2ML IJ SOLN
INTRAMUSCULAR | Status: DC | PRN
  Administered 2019-04-29: 25 ug via INTRAVENOUS

## 2019-04-29 MED ORDER — SODIUM CHLORIDE 0.9 % IV SOLN: INTRAVENOUS | Status: DC

## 2019-04-29 MED ORDER — ACETAMINOPHEN 325 MG PO TABS: 650.0000 mg | ORAL_TABLET | ORAL | Status: DC | PRN

## 2019-04-29 MED ORDER — ONDANSETRON HCL 4 MG/2ML IJ SOLN: 4.0000 mg | Freq: Four times a day (QID) | INTRAMUSCULAR | Status: DC | PRN

## 2019-04-29 MED ORDER — MIDAZOLAM HCL 2 MG/2ML IJ SOLN
INTRAMUSCULAR | Status: DC | PRN
  Administered 2019-04-29: 1 mg via INTRAVENOUS

## 2019-04-29 MED ORDER — IOHEXOL 350 MG/ML SOLN
INTRAVENOUS | Status: DC | PRN
  Administered 2019-04-29: 100 mL via INTRAVENOUS

## 2019-04-29 MED ORDER — ASPIRIN 81 MG PO CHEW: 81.0000 mg | CHEWABLE_TABLET | Freq: Every day | ORAL | Status: DC

## 2019-04-29 MED ORDER — DIAZEPAM 5 MG PO TABS: 5.0000 mg | ORAL_TABLET | Freq: Four times a day (QID) | ORAL | Status: DC | PRN

## 2019-04-29 MED ORDER — MIDAZOLAM HCL 2 MG/2ML IJ SOLN
INTRAMUSCULAR | Status: DC | PRN
  Administered 2019-04-29: 2 mg via INTRAVENOUS

## 2019-04-29 MED ORDER — LABETALOL HCL 5 MG/ML IV SOLN: 10.0000 mg | INTRAVENOUS | Status: DC | PRN

## 2019-04-29 MED ORDER — ISOSORBIDE MONONITRATE ER 30 MG PO TB24: 30.0000 mg | ORAL_TABLET | Freq: Every day | ORAL | 6 refills | Status: DC

## 2019-04-29 MED ORDER — SODIUM CHLORIDE 0.9 % WEIGHT BASED INFUSION
3.0000 mL/kg/h | INTRAVENOUS | Status: AC
  Administered 2019-04-29: 3 mL/kg/h via INTRAVENOUS

## 2019-04-29 MED ORDER — METOPROLOL TARTRATE 12.5 MG HALF TABLET
12.5000 mg | ORAL_TABLET | Freq: Two times a day (BID) | ORAL | Status: DC
  Administered 2019-04-29: 12.5 mg via ORAL
  Filled 2019-04-29: qty 1

## 2019-04-29 SURGICAL SUPPLY — 15 items
CATH BALLN WEDGE 5F 110CM (CATHETERS) ×1 IMPLANT
CATH INFINITI 5FR ANG PIGTAIL (CATHETERS) ×1 IMPLANT
CATH OPTITORQUE TIG 4.0 5F (CATHETERS) ×1 IMPLANT
DEVICE RAD COMP TR BAND LRG (VASCULAR PRODUCTS) ×1 IMPLANT
GLIDESHEATH SLEND SS 6F .021 (SHEATH) ×1 IMPLANT
GUIDEWIRE INQWIRE 1.5J.035X260 (WIRE) IMPLANT
INQWIRE 1.5J .035X260CM (WIRE) ×2
KIT HEART LEFT (KITS) ×2 IMPLANT
PACK CARDIAC CATHETERIZATION (CUSTOM PROCEDURE TRAY) ×2 IMPLANT
SHEATH GLIDE SLENDER 4/5FR (SHEATH) ×1 IMPLANT
SHEATH PROBE COVER 6X72 (BAG) ×1 IMPLANT
SYR MEDRAD MARK 7 150ML (SYRINGE) ×2 IMPLANT
TRANSDUCER W/STOPCOCK (MISCELLANEOUS) ×2 IMPLANT
TUBING CIL FLEX 10 FLL-RA (TUBING) ×2 IMPLANT
WIRE EMERALD 3MM-J .025X260CM (WIRE) ×1 IMPLANT

## 2019-04-29 NOTE — Research (Signed)
[place] Informed Consent   Subject Name: Chase Lloyd  Subject met inclusion and exclusion criteria.  The informed consent form, study requirements and expectations were reviewed with the subject and questions and concerns were addressed prior to the signing of the consent form.  The subject verbalized understanding of the trial requirements.  The subject agreed to participate in the [facility] trial and signed the informed consent at [place] on 04-29-2019.  The informed consent was obtained prior to performance of any protocol-specific procedures for the subject.  A copy of the signed informed consent was given to the subject and a copy was placed in the subject's medical record.   [name]

## 2019-04-29 NOTE — Discharge Instructions (Signed)
Radial Site Care ° °This sheet gives you information about how to care for yourself after your procedure. Your health care provider may also give you more specific instructions. If you have problems or questions, contact your health care provider. °What can I expect after the procedure? °After the procedure, it is common to have: °· Bruising and tenderness at the catheter insertion area. °Follow these instructions at home: °Medicines °· Take over-the-counter and prescription medicines only as told by your health care provider. °Insertion site care °· Follow instructions from your health care provider about how to take care of your insertion site. Make sure you: °? Wash your hands with soap and water before you change your bandage (dressing). If soap and water are not available, use hand sanitizer. °? Change your dressing as told by your health care provider. °? Leave stitches (sutures), skin glue, or adhesive strips in place. These skin closures may need to stay in place for 2 weeks or longer. If adhesive strip edges start to loosen and curl up, you may trim the loose edges. Do not remove adhesive strips completely unless your health care provider tells you to do that. °· Check your insertion site every day for signs of infection. Check for: °? Redness, swelling, or pain. °? Fluid or blood. °? Pus or a bad smell. °? Warmth. °· Do not take baths, swim, or use a hot tub until your health care provider approves. °· You may shower 24-48 hours after the procedure, or as directed by your health care provider. °? Remove the dressing and gently wash the site with plain soap and water. °? Pat the area dry with a clean towel. °? Do not rub the site. That could cause bleeding. °· Do not apply powder or lotion to the site. °Activity ° °· For 24 hours after the procedure, or as directed by your health care provider: °? Do not flex or bend the affected arm. °? Do not push or pull heavy objects with the affected arm. °? Do not  drive yourself home from the hospital or clinic. You may drive 24 hours after the procedure unless your health care provider tells you not to. °? Do not operate machinery or power tools. °· Do not lift anything that is heavier than 10 lb (4.5 kg), or the limit that you are told, until your health care provider says that it is safe. °· Ask your health care provider when it is okay to: °? Return to work or school. °? Resume usual physical activities or sports. °? Resume sexual activity. °General instructions °· If the catheter site starts to bleed, raise your arm and put firm pressure on the site. If the bleeding does not stop, get help right away. This is a medical emergency. °· If you went home on the same day as your procedure, a responsible adult should be with you for the first 24 hours after you arrive home. °· Keep all follow-up visits as told by your health care provider. This is important. °Contact a health care provider if: °· You have a fever. °· You have redness, swelling, or yellow drainage around your insertion site. °Get help right away if: °· You have unusual pain at the radial site. °· The catheter insertion area swells very fast. °· The insertion area is bleeding, and the bleeding does not stop when you hold steady pressure on the area. °· Your arm or hand becomes pale, cool, tingly, or numb. °These symptoms may represent a serious problem   that is an emergency. Do not wait to see if the symptoms will go away. Get medical help right away. Call your local emergency services (911 in the [place] Do not drive yourself to the hospital. °Summary °· After the procedure, it is common to have bruising and tenderness at the site. °· Follow instructions from your health care provider about how to take care of your radial site wound. Check the wound every day for signs of infection. °· Do not lift anything that is heavier than 10 lb (4.5 kg), or the limit that you are told, until your health care provider says  that it is safe. °This information is not intended to replace advice given to you by your health care provider. Make sure you discuss any questions you have with your health care provider. °Document Released: 11/22/2010 Document Revised: 11/25/2017 Document Reviewed: 11/25/2017 °Elsevier Interactive Patient Education © 2019 [employer] ° °

## 2019-04-29 NOTE — Progress Notes (Signed)
D/c instructions given to wife, Huntley Estelle, via telephone due to [place] restrictions.  All questions answered and Mare verbalized understanding.

## 2019-04-29 NOTE — Interval H&P Note (Signed)
Cath Lab Visit (complete for each Cath Lab visit)  Clinical Evaluation Leading to the Procedure:   ACS: No.  Non-ACS:    Anginal Classification: CCS III  Anti-ischemic medical therapy: Minimal Therapy (1 class of medications)  Non-Invasive Test Results: High-risk stress test findings: cardiac mortality >3%/year  Prior CABG: No previous CABG      History and Physical Interval Note:  04/29/2019 1:31 PM  Chase Lloyd  has presented today for surgery, with the diagnosis of cad - shortness of breath.  The various methods of treatment have been discussed with the patient and family. After consideration of risks, benefits and other options for treatment, the patient has consented to  Procedure(s): RIGHT/LEFT HEART CATH AND CORONARY ANGIOGRAPHY (N/A) as a surgical intervention.  The patient's history has been reviewed, patient examined, no change in status, stable for surgery.  I have reviewed the patient's chart and labs.  Questions were answered to the patient's satisfaction.     Shelva Majestic

## 2019-05-02 ENCOUNTER — Encounter (HOSPITAL_COMMUNITY): Payer: Self-pay | Admitting: Cardiovascular Disease

## 2019-05-10 ENCOUNTER — Ambulatory Visit (INDEPENDENT_AMBULATORY_CARE_PROVIDER_SITE_OTHER): Payer: Medicare Other | Admitting: Internal Medicine

## 2019-05-10 ENCOUNTER — Other Ambulatory Visit: Payer: Self-pay

## 2019-05-10 ENCOUNTER — Encounter: Payer: Self-pay | Admitting: Internal Medicine

## 2019-05-10 DIAGNOSIS — I2510 Atherosclerotic heart disease of native coronary artery without angina pectoris: Secondary | ICD-10-CM | POA: Diagnosis not present

## 2019-05-10 DIAGNOSIS — J449 Chronic obstructive pulmonary disease, unspecified: Secondary | ICD-10-CM | POA: Diagnosis not present

## 2019-05-10 DIAGNOSIS — I2584 Coronary atherosclerosis due to calcified coronary lesion: Secondary | ICD-10-CM | POA: Diagnosis not present

## 2019-05-10 MED ORDER — SYMBICORT 160-4.5 MCG/ACT IN AERO: 2.0000 | INHALATION_SPRAY | Freq: Two times a day (BID) | RESPIRATORY_TRACT | 0 refills | Status: DC

## 2019-05-10 NOTE — Assessment & Plan Note (Signed)
Quit smoking 06/2018  -  Spirometry 05/29/2017  FEV1 1.37 (39%)  Ratio 47 p am symb x2 and spirva x one and abn effort dep portion  - 05/29/2017   > rec  change  spiriva to  respimat and symb 160 2bid s spacer   - PFT's  07/30/2017  FEV1 1.39 (38 % ) ratio 48  p 23 % improvement from saba p nothing prior to study with DLCO  39/42 % corrects to 59 % for alv volume  - PFT's  10/22/2018  FEV1 1.26 (35 % ) ratio 44  p 14 % improvement from saba p symb x2  prior to study with DLCO  37/43c % corrects to 67 % for alv volume  - alpha one AT screen 12/03/2018   GM  Level 155  - 12/15/2018 notified that his insurance doesn't cover rehab  - 05/10/2019  After extensive coaching inhaler device,  effectiveness =    90%   Pt not using spiriva consistently enough to get beneift at this point and is clearly improving s it so and so reasonable to just use the smb 160 2bid and f/u in 6 m, sooner if needed   If losing ground with ex tolerance in the meantime it would be fine to resume spiriva but would need to be sure to take it daily, not prn sob    I had an extended discussion with the patient reviewing all relevant studies completed to date and  lasting 15 to 20 minutes of a 25 minute visit    See device teaching which extended face to face time for this visit.  Each maintenance medication was reviewed in detail including emphasizing most importantly the difference between maintenance and prns and under what circumstances the prns are to be triggered using an action plan format that is not reflected in the computer generated alphabetically organized AVS which I have not found useful in most complex patients, especially with respiratory illnesses  Please see AVS for specific instructions unique to this visit that I personally wrote and verbalized to the the pt in detail and then reviewed with pt  by my nurse highlighting any  changes in therapy recommended at today's visit to their plan of care.

## 2019-05-10 NOTE — Progress Notes (Signed)
Subjective:   Patient ID: Chase Lloyd, male    DOB: 08/17/1956,     MRN: [id number]      Brief patient profile:  10 yowm quit smoking 06/2018  from  [facility]  first aware of doe 2013 dx as copd rx symbicort helped a lot then pna/ sepsis Feb 2017 and added spiriva and rx 02 short term and moved to [place] may 2018 and referred to pulmonary clinic 05/29/2017 by Chase Lloyd with GOLD III criteria at initial eval    History of Present Illness  05/29/2017 1st [name]  Maint rx symb 160 2bid / spiriva dpi  Chief Complaint  Patient presents with   Pulmonary Consult    Pt referred by Dr. Carlisle Lloyd for COPD. Pt c/o DOE, prod cough with yellow mucus. Pt denies CP/tightness and f/c/s. Pt states he rarely uses his albuterol hfa.   ok at rest, sleeps fine with improving cough since quit smoking  Left leg swelling x months assoc with hip pain > ortho following with 'neg sonogram"  per pt  rec Plan A = Automatic = Symbicort 160 Take 2 puffs first thing in am and then another 2 puffs about 12 hours later and spiriva 2 puffs in am only (ok to use up the powder form if you want)  Work on inhaler technique:  relax and gently blow all the way out then take a nice smooth deep breath back in, triggering the inhaler at same time you start breathing in.  Hold for up to 5 seconds if you can. Blow out thru nose. Rinse and gargle with water when done Plan B = Backup Only use your albuterol as a rescue medication   07/30/2017  f/u ov/Chase Lloyd re:  GOLD III with reversibility / symb 160/spiriva smi Chief Complaint  Patient presents with   Follow-up    PFT's done today. His breathing is doing well. He has not had to use his rescue inhaler. No new co's today.   Not limited by breathing from desired activities but  Due to back and hip and leg issues  Sleeping fine/ no noct saba or am flares rec Work on maintaining perfect inhaler technique:  relax and gently blow all the way out then take  a nice smooth deep breath back in, triggering the inhaler at same time you start breathing in.    04/28/2018 acute extended ov/Chase Lloyd re:  GOLD III/  Chief Complaint  Patient presents with   Acute Visit    increased SOB x 2 months. He also c/o occ cough- prod at times with yellow sputum. He states he was just told by PCP today that he had bronchitis based on recent cxr 04/26/18. He is using his albuterol inhaler about every other day.   dyspnea gradually worse x 2 months to point = .MMRC2 = can't walk a nl pace on a flat grade s sob but does fine slow and flat but also limited back and L hip pain 02 none  saba qod  Dysphagia x years on prilosec q am but not ac  Off spiriva x months not convinced made any difference Already on pred/doxy per PCP since 04/26/18  rec Try taking omeprazole Take 30-60 min before first meal of the day  GERD diet   Plan A = Automatic = Symbicort  160 Take 2 puffs first thing in am and then another 2 puffs about 12 hours later.  Plan B = Backup Only use your albuterol  as a rescue medication   Finish the prednisone and antibiotic arleady prescribed  The key is to stop smoking completely before smoking completely stops you!  Please schedule a follow up visit in 3 months but call sooner if needed pft's > not done       09/10/18 NP eval/ aecopd aecopd x one week rec Continue Symbicort 160  Restart Spiriva Respimat 2.5 Continue prednisone Continue doxycycline DuoNeb nebulized medications prescribed today >>> If using more than 1-2 times a day please contact our office    09/21/2018 extended f/u  ov/Chase Lloyd re:  GOLD III/ group D with refractory sob  Chief Complaint  Patient presents with   Follow-up    Breathing is worse x 2 days. He is winded just walking from room to room at home. He is not sleeping well. He is having a right temporal HA.  He is coughing with yellow sputum.    Dyspnea:  Room to room, neb helps the most  Cough: thick yellow  Esp in am    Sleeping: poorly due to sob one pillow either side  SABA use: both hfa and neb now but uses grandson's neb / hfa poor  02: no 02  rec Plan A = Automatic = symbicort 160 Take 2 puffs first thing in am and then another 2 puffs about 12 hours later ok to use spirviva in evening = 1.25 (blue top) x 4 or 2.5 (green) x 2  Plan B = Backup Only use your albuterol inhaler as a rescue medication Plan C = Crisis - only use your albuterol nebulizer if you first try Plan B and it fails to help > ok to use the nebulizer up to every 4 hours but if start needing it regularly call for immediate appointment Plan D = Doctor - call me if B and C not adequate Plan E = ER - go to ER or call 911 if all else fails   Prednisone  10 mg Take 4 for three days 3 for three days 2 for three days 1 for three days and stop  Levaquin 500 mg one daily x 7 days - stop if aches in tendons  While coughing / wheezing > prilosec 40 mg Take 30- 60 min before your first and last meals of the day  GERD   Please schedule a follow up office visit in 4 weeks    Admission date:  10/06/2018   Discharge Date:  10/12/2018  Discharge Diagnosis  Hypertensive emergency [I16.1] Dyspnea, unspecified type [R06.00]   Principal Problem:   COPD with acute exacerbation ([place]    Acute respiratory distress   Hypertensive urgency   Troponin I above reference range   Dyspnea          10/22/2018  Post hosp f/u ov/Chase Lloyd re:  GOLD III / still on 30 mg pred / no longer smoking at all maint on symb/spiriva Chief Complaint  Patient presents with   Follow-up    Pt has some SOB with exertion, productive cough-yellow, and some wheezing in last week. He rarely uses his albuterol inhaler. He uses his neb 3 x daily.   Dyspnea:  MMRC3 = can't walk 100 yards even at a slow pace at a flat grade s stopping due to sob   Cough:  Better / some am congestion  Sleeping: bed is flat, sleeps on side SABA use: albuterol neb w/in 15 min of rising daily  02:  2lpm hs and some daytime  rec Plan A =  Automatic = symbicort 160 Take 2 puffs first thing in am chase with Spiriva  and then another 2 puffs about 12 hours later  Plan B = Backup Only use your albuterol inhaler  Plan C = Crisis - only use your albuterol nebulizer Continue to taper off the prednisone  Continue Prilosec 40 mg Take 30- 60 min before your first and last meals of the day  GERD  Diet         12/03/2018  f/u ov/Lazer Wollard re:   GOLD III/ maint sym/spiriva/ off prednisone/  Chief Complaint  Patient presents with   Follow-up    Breathing has improved some. He uses his albuterol inhaler about once per wk.   Dyspnea: hip stopping first  Cough: some nasal/ thraot congestion Sleeping: bed is flat  SABA use: rarely 02: 2lpm hs  rec No change rx     05/10/2019  f/u ov/Makaylin Carlo re:  GOLD III/  maint symb  Over cigs since 06/2019  - not using spiriva consistently  Chief Complaint  Patient presents with   Follow-up    Breathing is about the same. He is using his albuterol inhaler 2 x per wk on average.    Dyspnea:  Can walk a mile with rolling walker stopping a couple times due to sob/ also limited by hip pain   Cough: min throat clearing  Sleeping: bed is flat/ one pillow  SABA use: rarely needed 02: not using much at all    No obvious day to day or daytime variability or assoc excess/ purulent sputum or mucus plugs or hemoptysis or cp or chest tightness, subjective wheeze or overt sinus or hb symptoms.   Sleeping as above  without nocturnal  or early am exacerbation  of respiratory  c/o's or need for noct saba. Also denies any obvious fluctuation of symptoms with weather or environmental changes or other aggravating or alleviating factors except as outlined above   No unusual exposure hx or h/o childhood pna/ asthma or knowledge of premature birth.  Current Allergies, Complete Past Medical History, Past Surgical History, Family History, and Social History were reviewed in  [employer] record.  ROS  The following are not active complaints unless bolded Hoarseness, sore throat, dysphagia, dental problems, itching, sneezing,  nasal congestion or discharge of excess mucus or purulent secretions, ear ache,   fever, chills, sweats, unintended wt loss or wt gain, classically pleuritic or exertional cp,  orthopnea pnd or arm/hand swelling  or leg swelling, presyncope, palpitations, abdominal pain, anorexia, nausea, vomiting, diarrhea  or change in bowel habits or change in bladder habits, change in stools or change in urine, dysuria, hematuria,  rash, arthralgias, visual complaints, headache, numbness, weakness or ataxia or problems with walking or coordination,  change in mood or  memory.        Current Meds  Medication Sig   Albuterol Sulfate 108 (90 Base) MCG/ACT AEPB Inhale 1-2 puffs into the lungs every 6 (six) hours as needed (wheezing/shortness of breath).    aspirin EC 81 MG tablet Take 81 mg by mouth daily.   furosemide (LASIX) 20 MG tablet Take 1 tablet (20 mg total) by mouth daily.   ibuprofen (ADVIL) 800 MG tablet TAKE 1 TABLET BY MOUTH EVERY 8 HOURS AS NEEDED (Patient taking differently: Take 800 mg by mouth every 8 (eight) hours as needed (pain.). )   isosorbide mononitrate (IMDUR) 30 MG 24 hr tablet Take 1 tablet (30 mg total) by mouth daily.  metoprolol tartrate (LOPRESSOR) 25 MG tablet Take 0.5 tablets (12.5 mg total) by mouth 2 (two) times daily.   omeprazole (PRILOSEC) 40 MG capsule Take 40 mg by mouth 2 (two) times a day.    OXYGEN 2LPM AS NEEDED PER PT   SYMBICORT 160-4.5 MCG/ACT inhaler Inhale 2 puffs into the lungs 2 (two) times daily.   Tiotropium Bromide Monohydrate (SPIRIVA RESPIMAT) 2.5 MCG/ACT AERS Inhale 2 puffs into the lungs daily. (Patient taking differently: Inhale 2 puffs into the lungs every evening. )               Objective:   Physical Exam  amb wm nad   05/10/2019            192  12/03/2018           189 10/22/2018      184 09/21/2018      179  04/28/2018        173  07/30/2017        173  05/29/17 168 lb (76.2 kg)  05/01/17 164 lb (74.4 kg)  04/15/17 162 lb (73.5 kg)      Vital signs reviewed - Note on arrival 02 sats  95% on RA       HEENT: full dentures/ nl oropharynx. Nl external ear canals without cough reflex -  Mild bilateral non-specific turbinate edema     NECK :  without JVD/Nodes/TM/ nl carotid upstrokes bilaterally   LUNGS: no acc muscle use,  Mod barrel  contour chest wall with bilateral  Distant bs s audible wheeze and  without cough on insp or exp maneuver and mod  Hyperresonant  to  percussion bilaterally     CV:  RRR  no s3 or murmur or increase in P2, and no edema   ABD:  soft and nontender with pos mid insp Hoover's  in the supine position. No bruits or organomegaly appreciated, bowel sounds nl  MS:     ext warm without deformities, calf tenderness, cyanosis or clubbing No obvious joint restrictions   SKIN: warm and dry without lesions    NEURO:  alert, approp, nl sensorium with  no motor or cerebellar deficits apparent.     I personally reviewed images and agree with radiology impression as follows:   Chest CT 04/22/2019 as part of CT coronary study: Small right and trace left dependent pleural effusions. Severe centrilobular and paraseptal emphysema with diffuse bronchial wall thickening. Irregular nodular subpleural opacity in basilar right lower lobe (series 12/image 35) is unchanged since 10/06/2018 chest CT, presumably nodular pleural-parenchymal scarring. No acute consolidative airspace disease or new significant pulmonary nodules.         Assessment & Plan:

## 2019-05-10 NOTE — Patient Instructions (Signed)
Plan A = Automatic = symbicort 160 Take 2 puffs first thing in am and then another 2 puffs about 12 hours later.  Stop spiriva for now (it's like high octane fuel)  Plan B = Backup Only use your albuterol inhaler as a rescue medication to be used if you can't catch your breath by resting or doing a relaxed purse lip breathing pattern.  - The less you use it, the better it will work when you need it. - Ok to use the inhaler up to 2 puffs  every 4 hours if you must but call for appointment if use goes up over your usual need - Don't leave home without it !!  (think of it like the spare tire for your car)   Plan C = Crisis - only use your albuterol nebulizer if you first try Plan B and it fails to help > ok to use the nebulizer up to every 4 hours but if start needing it regularly call for immediate appointment   Please schedule a follow up visit in 6 months but call sooner if needed

## 2019-05-11 NOTE — Progress Notes (Signed)
Cardiology Office Note:    Date:  05/13/2019   ID:  Chase Lloyd, DOB 1956-05-10, MRN [id number]  PCP:  [name], NP  Cardiologist:  No primary care provider on file.   Referring MD: [name], NP   Chief Complaint  Patient presents with  . Coronary Artery Disease  . Shortness of Breath    History of Present Illness:    Chase Lloyd is a 63 y.o. male with a hx of a hx of shortness of breath and recent diagnosis of acute systolic heart failure, COPD Gold 3, and CA calcification/LM disease on FFRCTA (04/2019). Cath suggested better to treat medically.  The patient has severe COPD.  A coronary CT Angio raiseed the possibility of critical proximal left coronary disease.  This in turn we felt could potentially be contributing to dyspnea.  He therefore underwent coronary angiography and was documented to have moderate left main disease and critical distal LAD disease.  After reviewing the anatomy the most appropriate therapy was felt to be medical anti-ischemic therapy and secondary prevention measures with statin initiation.  We did not feel that the patient's coronary disease is contributing to dyspnea.  LVEDP was normal.  This was read discussed with the patient.  He has had no post cath complications.  Past Medical History:  Diagnosis Date  . Back pain   . COPD (chronic obstructive pulmonary disease) ([place])   . DDD (degenerative disc disease), lumbar   . Depression   . Emphysema of lung ([place])   . Empyema ([place])    2016  (was in [place])  . GERD (gastroesophageal reflux disease)   . Hearing loss of right ear    started 2001.     microphone in right ear ,  and hearing aid in left  . Hodgkin's disease ([place]) 1999  . Hypotension   . Meniere's disease   . Pneumonia   . Tinnitus     Past Surgical History:  Procedure Laterality Date  . BACK SURGERY    . CERVICAL FUSION  2016  . ELBOW SURGERY    . EYE SURGERY    . IR THORACENTESIS ASP PLEURAL SPACE W/IMG GUIDE   10/08/2018  . LAMINECTOMY  1999  . lower back surgery    . RIGHT/LEFT HEART CATH AND CORONARY ANGIOGRAPHY N/A 04/29/2019   Procedure: RIGHT/LEFT HEART CATH AND CORONARY ANGIOGRAPHY;  Surgeon: Troy Sine, MD;  Location: [facility];  Service: Cardiovascular;  Laterality: N/A;  . TONSILLECTOMY      Current Medications: Current Meds  Medication Sig  . Albuterol Sulfate 108 (90 Base) MCG/ACT AEPB Inhale 1-2 puffs into the lungs every 6 (six) hours as needed (wheezing/shortness of breath).   Marland Kitchen aspirin EC 81 MG tablet Take 81 mg by mouth daily.  . furosemide (LASIX) 20 MG tablet Take 1 tablet (20 mg total) by mouth daily.  Marland Kitchen ibuprofen (ADVIL) 800 MG tablet TAKE 1 TABLET BY MOUTH EVERY 8 HOURS AS NEEDED  . isosorbide mononitrate (IMDUR) 30 MG 24 hr tablet Take 1 tablet (30 mg total) by mouth daily.  Marland Kitchen omeprazole (PRILOSEC) 40 MG capsule Take 40 mg by mouth 2 (two) times a day.   . OXYGEN 2LPM AS NEEDED PER PT  . SYMBICORT 160-4.5 MCG/ACT inhaler Inhale 2 puffs into the lungs 2 (two) times daily.  . Tiotropium Bromide Monohydrate (SPIRIVA RESPIMAT) 2.5 MCG/ACT AERS Inhale 2 puffs into the lungs daily.  . [DISCONTINUED] metoprolol tartrate (LOPRESSOR) 25 MG tablet Take  0.5 tablets (12.5 mg total) by mouth 2 (two) times daily.     Allergies:   Patient has no known allergies.   Social History   Socioeconomic History  . Marital status: Married    Spouse name: Not on file  . Number of children: Not on file  . Years of education: Not on file  . Highest education level: Not on file  Occupational History  . Occupation: Retired  Scientific laboratory technician  . Financial resource strain: Not on file  . Food insecurity    Worry: Not on file    Inability: Not on file  . Transportation needs    Medical: Not on file    Non-medical: Not on file  Tobacco Use  . Smoking status: Former Smoker    Packs/day: 1.50    Years: 47.00    Pack years: 70.50    Types: Cigarettes    Quit date: 06/13/2018     Years since quitting: 0.9  . Smokeless tobacco: Never Used  Substance and Sexual Activity  . Alcohol use: Yes    Comment: 24 cans in a week  . Drug use: No  . Sexual activity: Not on file  Lifestyle  . Physical activity    Days per week: Not on file    Minutes per session: Not on file  . Stress: Not on file  Relationships  . Social Herbalist on phone: Not on file    Gets together: Not on file    Attends religious service: Not on file    Active member of club or organization: Not on file    Attends meetings of clubs or organizations: Not on file    Relationship status: Not on file  Other Topics Concern  . Not on file  Social History Narrative   Retied - worked as Furniture conservator/restorer         Family History: The patient's family history includes Diabetes in his father; Hypertension in his father; Melanoma in his brother; Testicular cancer in his brother.  ROS:   Please see the history of present illness.    Dyspnea with any level of physical activity, unchanged since I first met him earlier this year.  All other systems reviewed and are negative.  EKGs/Labs/Other Studies Reviewed:    The following studies were reviewed today:  CARDIAC CATH 04/2019: nostic Dominance: Right    EKG:  EKG no new study  Recent Labs: 05/31/2018: TSH 6.40 10/06/2018: B Natriuretic Peptide 65.7 11/10/2018: Magnesium 1.6 01/06/2019: NT-Pro BNP 239 02/14/2019: ALT 18 04/25/2019: BUN 11; Creatinine, Ser 0.99; Platelets 154 04/29/2019: Hemoglobin 11.2; Potassium 3.5; Sodium 137  Recent Lipid Panel    Component Value Date/Time   CHOL 182 05/05/2018 0803   TRIG 153.0 (H) 05/05/2018 0803   HDL 60.50 05/05/2018 0803   CHOLHDL 3 05/05/2018 0803   VLDL 30.6 05/05/2018 0803   LDLCALC 91 05/05/2018 0803    Physical Exam:    VS:  BP 138/64   Pulse 89   Ht '5\' 11"'  (1.803 m)   Wt 193 lb 9.6 oz (87.8 kg)   SpO2 94%   BMI 27.00 kg/m     Wt Readings from Last 3 Encounters:  05/13/19 193 lb 9.6 oz  (87.8 kg)  05/10/19 192 lb (87.1 kg)  04/29/19 194 lb (88 kg)     GEN: Frail-appearing and somewhat pale.. No acute distress HEENT: Normal NECK: No JVD. LYMPHATICS: No lymphadenopathy CARDIAC:  RRR without murmur, gallop, or edema. VASCULAR:  Normal Pulses. No bruits. RESPIRATORY: Decreased air movement without wheezing or rhonchi. ABDOMEN: Soft, non-tender, non-distended, No pulsatile mass, MUSCULOSKELETAL: No deformity  SKIN: Warm and dry NEUROLOGIC:  Alert and oriented x 3 PSYCHIATRIC:  Normal affect   ASSESSMENT:    1. Coronary artery disease involving native coronary artery of native heart with other form of angina pectoris ([place])   2. Essential hypertension   3. Intermittent claudication ([place])   4. COPD with acute exacerbation ([place])   5. DOE (dyspnea on exertion)   6. Hyperlipidemia with target LDL less than 70    PLAN:    In order of problems listed above:  1. Moderate coronary disease felt best treated with secondary prevention and anti-ischemic therapy (metoprolol succinate and isosorbide have been started post cath).  Tolerating both medications.  Breathing has not worsened nor has it improved. 2. Target less than 130/80 mmHg 3. Evidence of atherosclerosis 4. Severe dyspnea related to this problem 5. Feel that DOE is not ischemically mediated 6. LDL target less than 70.  Rosuvastatin 10 mg/day is started.  Liver and lipid panel will be performed in 8 weeks.  Overall education and awareness concerning primary/secondary risk prevention was discussed in detail: LDL less than 70, hemoglobin A1c less than 7, blood pressure target less than 130/80 mmHg, >150 minutes of moderate aerobic activity per week, avoidance of smoking, weight control (via diet and exercise), and continued surveillance/management of/for obstructive sleep apnea.    Medication Adjustments/Labs and Tests Ordered: Current medicines are reviewed at length with the patient today.  Concerns regarding  medicines are outlined above.  Orders Placed This Encounter  Procedures  . Hepatic function panel  . Lipid panel   Meds ordered this encounter  Medications  . metoprolol succinate (TOPROL-XL) 25 MG 24 hr tablet    Sig: Take 1 tablet (25 mg total) by mouth daily.    Dispense:  90 tablet    Refill:  3    D/c Tartrate  . rosuvastatin (CRESTOR) 10 MG tablet    Sig: Take 1 tablet (10 mg total) by mouth daily.    Dispense:  90 tablet    Refill:  3    Patient Instructions  Medication Instructions:  1) DISCONTINUE Metoprolol Tartrate 2) START Metoprolol Succinate 23m once daily 3) START Rosuvastatin 144monce daily  If you need a refill on your cardiac medications before your next appointment, please call your pharmacy.   Lab work: Your physician recommends that you return for lab work in: 8 weeks (Lipid, Liver).  You will need to be fasting for these labs (nothing to eat or drink after midnight except water and black coffee).   If you have labs (blood work) drawn today and your tests are completely normal, you will receive your results only by: . Marland KitchenyChart Message (if you have MyChart) OR . A paper copy in the mail If you have any lab test that is abnormal or we need to change your treatment, we will call you to review the results.  Testing/Procedures: None  Follow-Up: [facility].  As part of our continuing mission to provide you with exceptional heart care, we have created designated Provider Care Teams.  These Care Teams include your primary Cardiologist (physician) and Advanced Practice Providers (APPs -  Physician Assistants and Nurse Practitioners) who all work together to provide you with the care you need, when you need it. You will need a follow up appointment in  6 months.  Please call our office 2 months in advance to schedule this appointment.  You may see Dr. [name] or one of the following Advanced Practice Providers on  your designated Care Team:   Truitt Merle, NP Cecilie Kicks, NP . Kathyrn Drown, NP  Any Other Special Instructions Will Be Listed Below (If Applicable).       Signed, Sinclair Grooms, MD  05/13/2019 10:19 AM    [place]

## 2019-05-12 ENCOUNTER — Telehealth: Payer: Self-pay | Admitting: Interventional Cardiology

## 2019-05-12 NOTE — Telephone Encounter (Signed)

## 2019-05-13 ENCOUNTER — Other Ambulatory Visit: Payer: Self-pay

## 2019-05-13 ENCOUNTER — Ambulatory Visit (INDEPENDENT_AMBULATORY_CARE_PROVIDER_SITE_OTHER): Payer: Medicare Other | Admitting: Interventional Cardiology

## 2019-05-13 ENCOUNTER — Encounter: Payer: Self-pay | Admitting: Interventional Cardiology

## 2019-05-13 ENCOUNTER — Other Ambulatory Visit: Payer: Self-pay | Admitting: *Deleted

## 2019-05-13 VITALS — BP 138/64 | HR 89 | Ht 71.0 in | Wt 193.6 lb

## 2019-05-13 DIAGNOSIS — I739 Peripheral vascular disease, unspecified: Secondary | ICD-10-CM

## 2019-05-13 DIAGNOSIS — I10 Essential (primary) hypertension: Secondary | ICD-10-CM | POA: Diagnosis not present

## 2019-05-13 DIAGNOSIS — J441 Chronic obstructive pulmonary disease with (acute) exacerbation: Secondary | ICD-10-CM | POA: Diagnosis not present

## 2019-05-13 DIAGNOSIS — I2584 Coronary atherosclerosis due to calcified coronary lesion: Secondary | ICD-10-CM

## 2019-05-13 DIAGNOSIS — E785 Hyperlipidemia, unspecified: Secondary | ICD-10-CM

## 2019-05-13 DIAGNOSIS — R0609 Other forms of dyspnea: Secondary | ICD-10-CM | POA: Diagnosis not present

## 2019-05-13 DIAGNOSIS — I25118 Atherosclerotic heart disease of native coronary artery with other forms of angina pectoris: Secondary | ICD-10-CM

## 2019-05-13 DIAGNOSIS — I2510 Atherosclerotic heart disease of native coronary artery without angina pectoris: Secondary | ICD-10-CM | POA: Diagnosis not present

## 2019-05-13 MED ORDER — ROSUVASTATIN CALCIUM 10 MG PO TABS: 10.0000 mg | ORAL_TABLET | Freq: Every day | ORAL | 3 refills | Status: DC

## 2019-05-13 MED ORDER — METOPROLOL SUCCINATE ER 25 MG PO TB24: 25.0000 mg | ORAL_TABLET | Freq: Every day | ORAL | 3 refills | Status: DC

## 2019-05-13 NOTE — Patient Instructions (Signed)
Medication Instructions:  1) DISCONTINUE Metoprolol Tartrate 2) START Metoprolol Succinate 25mg  once daily 3) START Rosuvastatin 10mg  once daily  If you need a refill on your cardiac medications before your next appointment, please call your pharmacy.   Lab work: Your physician recommends that you return for lab work in: 8 weeks (Lipid, Liver).  You will need to be fasting for these labs (nothing to eat or drink after midnight except water and black coffee).   If you have labs (blood work) drawn today and your tests are completely normal, you will receive your results only by: Marland Kitchen MyChart Message (if you have MyChart) OR . A paper copy in the mail If you have any lab test that is abnormal or we need to change your treatment, we will call you to review the results.  Testing/Procedures: None  Follow-Up: At [facility], you and your health needs are our priority.  As part of our continuing mission to provide you with exceptional heart care, we have created designated Provider Care Teams.  These Care Teams include your primary Cardiologist (physician) and Advanced Practice Providers (APPs -  Physician Assistants and Nurse Practitioners) who all work together to provide you with the care you need, when you need it. You will need a follow up appointment in 6 months.  Please call our office 2 months in advance to schedule this appointment.  You may see Dr. [name] or one of the following Advanced Practice Providers on your designated Care Team:   Truitt Merle, NP Cecilie Kicks, NP . Kathyrn Drown, NP  Any Other Special Instructions Will Be Listed Below (If Applicable).

## 2019-05-13 NOTE — Progress Notes (Signed)
Error

## 2019-06-30 ENCOUNTER — Telehealth (INDEPENDENT_AMBULATORY_CARE_PROVIDER_SITE_OTHER): Payer: Medicare Other | Admitting: Adult Health

## 2019-06-30 ENCOUNTER — Other Ambulatory Visit: Payer: Self-pay

## 2019-06-30 DIAGNOSIS — N400 Enlarged prostate without lower urinary tract symptoms: Secondary | ICD-10-CM | POA: Diagnosis not present

## 2019-06-30 DIAGNOSIS — I2510 Atherosclerotic heart disease of native coronary artery without angina pectoris: Secondary | ICD-10-CM

## 2019-06-30 DIAGNOSIS — I2584 Coronary atherosclerosis due to calcified coronary lesion: Secondary | ICD-10-CM | POA: Diagnosis not present

## 2019-06-30 MED ORDER — TAMSULOSIN HCL 0.4 MG PO CAPS: 0.4000 mg | ORAL_CAPSULE | Freq: Every day | ORAL | 1 refills | Status: DC

## 2019-06-30 NOTE — Progress Notes (Signed)
Virtual Visit via Video Note  I connected with Chase Lloyd on 06/30/19 at  1:00 PM EDT by a video enabled telemedicine application and verified that I am speaking with the correct person using two identifiers.  Location patient: home Location provider:work or home office Persons participating in the virtual visit: patient, provider  I discussed the limitations of evaluation and management by telemedicine and the availability of in person appointments. The patient expressed understanding and agreed to proceed.   HPI: 63 year old male who is being evaluated today for an acute issue.  His symptoms started 2 to 3 months ago.  His symptoms include nocturia, 3 times on average and night, urinary incontinence and dribbling, decreased stream, urinary hesitancy, and the feeling of incomplete bladder emptying.  He denies fevers, chills, abdominal pain, pain with defecation, or feeling acutely ill.  He has not noticed any dysuria or hematuria  Lab Results  Component Value Date   PSA 0.45 05/05/2018      ROS: See pertinent positives and negatives per HPI.  Past Medical History:  Diagnosis Date  . Back pain   . COPD (chronic obstructive pulmonary disease) ([place])   . DDD (degenerative disc disease), lumbar   . Depression   . Emphysema of lung ([place])   . Empyema ([place])    2016  (was in [place])  . GERD (gastroesophageal reflux disease)   . Hearing loss of right ear    started 2001.     microphone in right ear ,  and hearing aid in left  . Hodgkin's disease ([place]) 1999  . Hypotension   . Meniere's disease   . Pneumonia   . Tinnitus     Past Surgical History:  Procedure Laterality Date  . BACK SURGERY    . CERVICAL FUSION  2016  . ELBOW SURGERY    . EYE SURGERY    . IR THORACENTESIS ASP PLEURAL SPACE W/IMG GUIDE  10/08/2018  . LAMINECTOMY  1999  . lower back surgery    . RIGHT/LEFT HEART CATH AND CORONARY ANGIOGRAPHY N/A 04/29/2019   Procedure: RIGHT/LEFT HEART CATH AND CORONARY  ANGIOGRAPHY;  Surgeon: Troy Sine, MD;  Location: [facility];  Service: Cardiovascular;  Laterality: N/A;  . TONSILLECTOMY      Family History  Problem Relation Age of Onset  . Hypertension Father   . Diabetes Father   . Testicular cancer Brother   . Melanoma Brother      Current Outpatient Medications:  .  Albuterol Sulfate 108 (90 Base) MCG/ACT AEPB, Inhale 1-2 puffs into the lungs every 6 (six) hours as needed (wheezing/shortness of breath). , Disp: , Rfl:  .  aspirin EC 81 MG tablet, Take 81 mg by mouth daily., Disp: , Rfl:  .  furosemide (LASIX) 20 MG tablet, Take 1 tablet (20 mg total) by mouth daily., Disp: 30 tablet, Rfl: 11 .  ibuprofen (ADVIL) 800 MG tablet, TAKE 1 TABLET BY MOUTH EVERY 8 HOURS AS NEEDED, Disp: 180 tablet, Rfl: 0 .  isosorbide mononitrate (IMDUR) 30 MG 24 hr tablet, Take 1 tablet (30 mg total) by mouth daily., Disp: 30 tablet, Rfl: 6 .  metoprolol succinate (TOPROL-XL) 25 MG 24 hr tablet, Take 1 tablet (25 mg total) by mouth daily., Disp: 90 tablet, Rfl: 3 .  omeprazole (PRILOSEC) 40 MG capsule, Take 40 mg by mouth 2 (two) times a day. , Disp: , Rfl:  .  OXYGEN, 2LPM AS NEEDED PER PT, Disp: , Rfl:  .  rosuvastatin (CRESTOR) 10 MG tablet, Take 1 tablet (10 mg total) by mouth daily., Disp: 90 tablet, Rfl: 3 .  SYMBICORT 160-4.5 MCG/ACT inhaler, Inhale 2 puffs into the lungs 2 (two) times daily., Disp: 1 Inhaler, Rfl: 0 .  Tiotropium Bromide Monohydrate (SPIRIVA RESPIMAT) 2.5 MCG/ACT AERS, Inhale 2 puffs into the lungs daily., Disp: , Rfl:   EXAM:  VITALS per patient if applicable:  GENERAL: alert, oriented, appears well and in no acute distress  HEENT: atraumatic, conjunttiva clear, no obvious abnormalities on inspection of external nose and ears  NECK: normal movements of the head and neck  LUNGS: on inspection no signs of respiratory distress, breathing rate appears normal, no obvious gross SOB, gasping or wheezing  CV: no obvious  cyanosis  MS: moves all visible extremities without noticeable abnormality  PSYCH/NEURO: pleasant and cooperative, no obvious depression or anxiety, speech and thought processing grossly intact  ASSESSMENT AND PLAN:  Discussed the following assessment and plan:  1. BPH without obstruction/lower urinary tract symptoms -Does not appear to be prostatitis.  Will prescribe Flomax for BPH with lower urinary tract symptoms.  He was advised to follow-up in 2 weeks - tamsulosin (FLOMAX) 0.4 MG CAPS capsule; Take 1 capsule (0.4 mg total) by mouth daily.  Dispense: 30 capsule; Refill: 1     I discussed the assessment and treatment plan with the patient. The patient was provided an opportunity to ask questions and all were answered. The patient agreed with the plan and demonstrated an understanding of the instructions.   The patient was advised to call back or seek an in-person evaluation if the symptoms worsen or if the condition fails to improve as anticipated.   [name], NP

## 2019-07-01 ENCOUNTER — Other Ambulatory Visit: Payer: Medicare Other | Admitting: *Deleted

## 2019-07-01 ENCOUNTER — Other Ambulatory Visit: Payer: Self-pay

## 2019-07-01 DIAGNOSIS — I25118 Atherosclerotic heart disease of native coronary artery with other forms of angina pectoris: Secondary | ICD-10-CM

## 2019-07-01 LAB — HEPATIC FUNCTION PANEL
ALT: 28 IU/L (ref 0–44)
AST: 26 IU/L (ref 0–40)
Albumin: 4.5 g/dL (ref 3.8–4.8)
Alkaline Phosphatase: 76 IU/L (ref 39–117)
Bilirubin Total: 0.9 mg/dL (ref 0.0–1.2)
Bilirubin, Direct: 0.26 mg/dL (ref 0.00–0.40)
Total Protein: 6.8 g/dL (ref 6.0–8.5)

## 2019-07-01 LAB — LIPID PANEL
Chol/HDL Ratio: 2.9 ratio (ref 0.0–5.0)
Cholesterol, Total: 136 mg/dL (ref 100–199)
HDL: 47 mg/dL (ref >39)
LDL Calculated: 66 mg/dL (ref 0–99)
Triglycerides: 116 mg/dL (ref 0–149)
VLDL Cholesterol Cal: 23 mg/dL (ref 5–40)

## 2019-07-12 ENCOUNTER — Other Ambulatory Visit: Payer: Self-pay | Admitting: Adult Health

## 2019-07-12 NOTE — Telephone Encounter (Signed)
Pulmonary fills this

## 2019-07-16 ENCOUNTER — Other Ambulatory Visit: Payer: Self-pay | Admitting: Adult Health

## 2019-07-18 ENCOUNTER — Other Ambulatory Visit: Payer: Self-pay | Admitting: Adult Health

## 2019-07-18 NOTE — Telephone Encounter (Signed)
Medication Refill - Medication: Albuterol Sulfate 108 (90 Base) MCG/ACT AEPB,   Tiotropium Bromide Monohydrate (SPIRIVA RESPIMAT) 2.5 MCG/ACT AERS    Has the patient contacted their pharmacy? Yes.   (Agent: If no, request that the patient contact the pharmacy for the refill.) (Agent: If yes, when and what did the pharmacy advise?)  Preferred Pharmacy (with phone number or street name): [place], [place] - [id number] [phone] (Phone) [phone] (Fax)     Agent: Please be advised that RX refills may take up to 3 business days. We ask that you follow-up with your pharmacy.

## 2019-07-18 NOTE — Telephone Encounter (Signed)
Requested medication (s) are due for refill today: yes  Requested medication (s) are on the active medication list: yes   Future visit scheduled: no  Notes to clinic: review for refill   Requested Prescriptions  Pending Prescriptions Disp Refills   Albuterol Sulfate 108 (90 Base) MCG/ACT AEPB       Sig: Inhale 1-2 puffs into the lungs every 6 (six) hours as needed (wheezing/shortness of breath).     Pulmonology:  Beta Agonists Failed - 07/18/2019  9:51 AM      Failed - One inhaler should last at least one month. If the patient is requesting refills earlier, contact the patient to check for uncontrolled symptoms.      Passed - Valid encounter within last 12 months    Recent Outpatient Visits          2 weeks ago BPH without obstruction/lower urinary tract symptoms   Therapist, music at [place], NP   5 months ago Right lower quadrant abdominal pain   Therapist, music at [facility], [place], NP   7 months ago Depression, recurrent ([place])   Therapist, music at [facility], [place], NP   8 months ago [place] at [facility], [place], NP   10 months ago COPD exacerbation ([place])   Therapist, music at [facility], [place], NP      Future Appointments            In 3 months Tanda Rockers, MD [facility]            Tiotropium Bromide Monohydrate (SPIRIVA RESPIMAT) 2.5 MCG/ACT AERS       Sig: Inhale 2 puffs into the lungs daily.     Pulmonology:  Anticholinergic Agents Passed - 07/18/2019  9:51 AM      Passed - Valid encounter within last 12 months    Recent Outpatient Visits          2 weeks ago BPH without obstruction/lower urinary tract symptoms   Therapist, music at [place], NP   5 months ago Right lower quadrant abdominal pain   Therapist, music at [place], NP   7 months ago Depression, recurrent ([place])   Therapist, music at [facility],  [place], NP   8 months ago [place] at [facility], [place], NP   10 months ago COPD exacerbation ([place])   Therapist, music at [facility] [employer], [place], NP      Future Appointments            In 3 months W[place], Christena Deem, MD [facility]

## 2019-07-19 ENCOUNTER — Ambulatory Visit (INDEPENDENT_AMBULATORY_CARE_PROVIDER_SITE_OTHER): Payer: Medicare Other

## 2019-07-19 ENCOUNTER — Ambulatory Visit (INDEPENDENT_AMBULATORY_CARE_PROVIDER_SITE_OTHER): Payer: Medicare Other | Admitting: Internal Medicine

## 2019-07-19 ENCOUNTER — Encounter: Payer: Self-pay | Admitting: Internal Medicine

## 2019-07-19 ENCOUNTER — Other Ambulatory Visit: Payer: Self-pay

## 2019-07-19 DIAGNOSIS — R0609 Other forms of dyspnea: Secondary | ICD-10-CM

## 2019-07-19 DIAGNOSIS — J9611 Chronic respiratory failure with hypoxia: Secondary | ICD-10-CM

## 2019-07-19 DIAGNOSIS — I2510 Atherosclerotic heart disease of native coronary artery without angina pectoris: Secondary | ICD-10-CM | POA: Diagnosis not present

## 2019-07-19 DIAGNOSIS — I2584 Coronary atherosclerosis due to calcified coronary lesion: Secondary | ICD-10-CM

## 2019-07-19 DIAGNOSIS — J441 Chronic obstructive pulmonary disease with (acute) exacerbation: Secondary | ICD-10-CM | POA: Diagnosis not present

## 2019-07-19 DIAGNOSIS — J9612 Chronic respiratory failure with hypercapnia: Secondary | ICD-10-CM

## 2019-07-19 DIAGNOSIS — J449 Chronic obstructive pulmonary disease, unspecified: Secondary | ICD-10-CM | POA: Diagnosis not present

## 2019-07-19 DIAGNOSIS — R0602 Shortness of breath: Secondary | ICD-10-CM | POA: Diagnosis not present

## 2019-07-19 LAB — CBC WITH DIFFERENTIAL/PLATELET
Basophils Absolute: 0 10*3/uL (ref 0.0–0.1)
Basophils Relative: 0.5 % (ref 0.0–3.0)
Eosinophils Absolute: 0.1 10*3/uL (ref 0.0–0.7)
Eosinophils Relative: 1.5 % (ref 0.0–5.0)
HCT: 34.8 % — ABNORMAL LOW (ref 39.0–52.0)
Hemoglobin: 12 g/dL — ABNORMAL LOW (ref 13.0–17.0)
Lymphocytes Relative: 16.4 % (ref 12.0–46.0)
Lymphs Abs: 0.7 10*3/uL (ref 0.7–4.0)
MCHC: 34.4 g/dL (ref 30.0–36.0)
MCV: 98.3 fl (ref 78.0–100.0)
Monocytes Absolute: 0.4 10*3/uL (ref 0.1–1.0)
Monocytes Relative: 8.4 % (ref 3.0–12.0)
Neutro Abs: 3.3 10*3/uL (ref 1.4–7.7)
Neutrophils Relative %: 73.2 % (ref 43.0–77.0)
Platelets: 137 10*3/uL — ABNORMAL LOW (ref 150.0–400.0)
RBC: 3.54 Mil/uL — ABNORMAL LOW (ref 4.22–5.81)
RDW: 13.3 % (ref 11.5–15.5)
WBC: 4.4 10*3/uL (ref 4.0–10.5)

## 2019-07-19 LAB — BASIC METABOLIC PANEL
BUN: 13 mg/dL (ref 6–23)
CO2: 33 mEq/L — ABNORMAL HIGH (ref 19–32)
Calcium: 9.4 mg/dL (ref 8.4–10.5)
Chloride: 98 mEq/L (ref 96–112)
Creatinine, Ser: 1.27 mg/dL (ref 0.40–1.50)
GFR: 56.93 mL/min — ABNORMAL LOW (ref >60.00)
Glucose, Bld: 93 mg/dL (ref 70–99)
Potassium: 3.7 mEq/L (ref 3.5–5.1)
Sodium: 139 mEq/L (ref 135–145)

## 2019-07-19 LAB — TSH: TSH: 6.36 u[IU]/mL — ABNORMAL HIGH (ref 0.35–4.50)

## 2019-07-19 LAB — BRAIN NATRIURETIC PEPTIDE: Pro B Natriuretic peptide (BNP): 108 pg/mL — ABNORMAL HIGH (ref 0.0–100.0)

## 2019-07-19 MED ORDER — ALBUTEROL SULFATE 108 (90 BASE) MCG/ACT IN AEPB: 1.0000 | INHALATION_SPRAY | Freq: Four times a day (QID) | RESPIRATORY_TRACT | 0 refills | Status: DC | PRN

## 2019-07-19 MED ORDER — PREDNISONE 10 MG PO TABS: ORAL_TABLET | ORAL | 0 refills | Status: DC

## 2019-07-19 MED ORDER — ALBUTEROL SULFATE (2.5 MG/3ML) 0.083% IN NEBU: 2.5000 mg | INHALATION_SOLUTION | RESPIRATORY_TRACT | 12 refills | Status: DC | PRN

## 2019-07-19 MED ORDER — SPIRIVA RESPIMAT 2.5 MCG/ACT IN AERS: 2.0000 | INHALATION_SPRAY | Freq: Every day | RESPIRATORY_TRACT | 0 refills | Status: DC

## 2019-07-19 NOTE — Progress Notes (Signed)
Subjective:   Patient ID: Chase Lloyd, male    DOB: 1956-02-28,     MRN: [id number]    Brief patient profile:  49 yowm MG phenotype  quit smoking 06/2018  from  [facility]  first aware of doe 2013 dx as copd rx symbicort helped a lot then pna/ sepsis Feb 2017 and added spiriva and rx 02 short term and moved to [place] may 2018 and referred to pulmonary clinic 05/29/2017 by [name] with GOLD III criteria at initial eval    History of Present Illness  05/29/2017 1st [facility] office visit/ Grizelda Piscopo  Maint rx symb 160 2bid / spiriva dpi  Chief Complaint  Patient presents with   Pulmonary Consult    Pt referred by Dr. [name] for COPD. Pt c/o DOE, prod cough with yellow mucus. Pt denies CP/tightness and f/c/s. Pt states he rarely uses his albuterol hfa.   ok at rest, sleeps fine with improving cough since quit smoking  Left leg swelling x months assoc with hip pain > ortho following with 'neg sonogram"  per pt  rec Plan A = Automatic = Symbicort 160 Take 2 puffs first thing in am and then another 2 puffs about 12 hours later and spiriva 2 puffs in am only (ok to use up the powder form if you want)  Work on inhaler technique:  relax and gently blow all the way out then take a nice smooth deep breath back in, triggering the inhaler at same time you start breathing in.  Hold for up to 5 seconds if you can. Blow out thru nose. Rinse and gargle with water when done Plan B = Backup Only use your albuterol as a rescue medication   07/30/2017  f/u ov/[name] re:  GOLD III with reversibility / symb 160/spiriva smi Chief Complaint  Patient presents with   Follow-up    PFT's done today. His breathing is doing well. He has not had to use his rescue inhaler. No new co's today.   Not limited by breathing from desired activities but  Due to back and hip and leg issues  Sleeping fine/ no noct saba or am flares rec Work on maintaining perfect inhaler technique:  relax and gently blow all the way  out then take a nice smooth deep breath back in, triggering the inhaler at same time you start breathing in.    04/28/2018 acute extended ov/Jovana Rembold re:  GOLD III/  Chief Complaint  Patient presents with   Acute Visit    increased SOB x 2 months. He also c/o occ cough- prod at times with yellow sputum. He states he was just told by PCP today that he had bronchitis based on recent cxr 04/26/18. He is using his albuterol inhaler about every other day.   dyspnea gradually worse x 2 months to point = .MMRC2 = can't walk a nl pace on a flat grade s sob but does fine slow and flat but also limited back and L hip pain 02 none  saba qod  Dysphagia x years on prilosec q am but not ac  Off spiriva x months not convinced made any difference Already on pred/doxy per PCP since 04/26/18  rec Try taking omeprazole Take 30-60 min before first meal of the day  GERD diet   Plan A = Automatic = Symbicort  160 Take 2 puffs first thing in am and then another 2 puffs about 12 hours later.  Plan B = Backup Only use your  albuterol as a rescue medication   Finish the prednisone and antibiotic arleady prescribed  The key is to stop smoking completely before smoking completely stops you!  Please schedule a follow up visit in 3 months but call sooner if needed pft's > not done       09/10/18 NP eval/ aecopd aecopd x one week rec Continue Symbicort 160  Restart Spiriva Respimat 2.5 Continue prednisone Continue doxycycline DuoNeb nebulized medications prescribed today >>> If using more than 1-2 times a day please contact our office    09/21/2018 extended f/u  ov/[name] re:  GOLD III/ group D with refractory sob  Chief Complaint  Patient presents with   Follow-up    Breathing is worse x 2 days. He is winded just walking from room to room at home. He is not sleeping well. He is having a right temporal HA.  He is coughing with yellow sputum.    Dyspnea:  Room to room, neb helps the most  Cough: thick yellow   Esp in am  Sleeping: poorly due to sob one pillow either side  SABA use: both hfa and neb now but uses grandson's neb / hfa poor  02: no 02  rec Plan A = Automatic = symbicort 160 Take 2 puffs first thing in am and then another 2 puffs about 12 hours later ok to use spirviva in evening = 1.25 (blue top) x 4 or 2.5 (green) x 2  Plan B = Backup Only use your albuterol inhaler as a rescue medication Plan C = Crisis - only use your albuterol nebulizer if you first try Plan B and it fails to help > ok to use the nebulizer up to every 4 hours but if start needing it regularly call for immediate appointment Plan D = Doctor - call me if B and C not adequate Plan E = ER - go to ER or call 911 if all else fails   Prednisone  10 mg Take 4 for three days 3 for three days 2 for three days 1 for three days and stop  Levaquin 500 mg one daily x 7 days - stop if aches in tendons  While coughing / wheezing > prilosec 40 mg Take 30- 60 min before your first and last meals of the day  GERD   Please schedule a follow up office visit in 4 weeks    Admission date:  10/06/2018   Discharge Date:  10/12/2018  Discharge Diagnosis  Hypertensive emergency [I16.1] Dyspnea, unspecified type [R06.00]   Principal Problem:   COPD with acute exacerbation ([place]    Acute respiratory distress   Hypertensive urgency   Troponin I above reference range   Dyspnea          10/22/2018  Post hosp f/u ov/[name] re:  GOLD III / still on 30 mg pred / no longer smoking at all maint on symb/spiriva Chief Complaint  Patient presents with   Follow-up    Pt has some SOB with exertion, productive cough-yellow, and some wheezing in last week. He rarely uses his albuterol inhaler. He uses his neb 3 x daily.   Dyspnea:  MMRC3 = can't walk 100 yards even at a slow pace at a flat grade s stopping due to sob   Cough:  Better / some am congestion  Sleeping: bed is flat, sleeps on side SABA use: albuterol neb w/in 15 min of rising  daily  02: 2lpm hs and some daytime  rec Plan A =  Automatic = symbicort 160 Take 2 puffs first thing in am chase with Spiriva  and then another 2 puffs about 12 hours later  Plan B = Backup Only use your albuterol inhaler  Plan C = Crisis - only use your albuterol nebulizer Continue to taper off the prednisone  Continue Prilosec 40 mg Take 30- 60 min before your first and last meals of the day  GERD  Diet         12/03/2018  f/u ov/Elga Santy re:   GOLD III/ maint sym/spiriva/ off prednisone/  Chief Complaint  Patient presents with   Follow-up    Breathing has improved some. He uses his albuterol inhaler about once per wk.   Dyspnea: hip stopping first  Cough: some nasal/ thraot congestion Sleeping: bed is flat  SABA use: rarely 02: 2lpm hs  rec No change rx     05/10/2019  f/u ov/Londa Mackowski re:  GOLD III/  maint symb  Over cigs since 06/2019  - not using spiriva consistently  Chief Complaint  Patient presents with   Follow-up    Breathing is about the same. He is using his albuterol inhaler 2 x per wk on average.   Dyspnea:  Can walk a mile with rolling walker stopping a couple times due to sob/ also limited by hip pain   Cough: min throat clearing  Sleeping: bed is flat/ one pillow  SABA use: rarely needed 02: not using much at all  rec Plan A = Automatic = symbicort 160 Take 2 puffs first thing in am and then another 2 puffs about 12 hours later.  Stop spiriva for now (it's like high octane fuel) Plan B = Backup Only use your albuterol inhaler as a rescue medication  Plan C = Crisis - only use your albuterol nebulizer if you first try Plan B and it fails to help > ok to use the nebulizer up to every 4 hours but if start needing it regularly call for immediate appointment   07/19/2019 acute extended ov/Angeleah Labrake re:  GOLD III worse sob x 6 weeks gradually which corresponds roughly to time off spiriva and just on symb 160 2bid  Chief Complaint  Patient presents with   Acute Visit      Increased SOB x 6 wks. He has also noticed some wheezing. He is using neb every am but unsure name of medication. He is using his albuterol inhaler 1-2 x per day.   indolent onset gradually worse doe now to point = MMRC4  = sob if tries to leave home or while getting dressed   Assoc some wheeze/ phelgm in throat sensation but really no excess or purulent sputum  Sleeping in a flat bed/ one pillow under head/ no am wakening or flares  02 has it only uses sometimes at hs  Saba: too much saba use noted        No obvious day to day or daytime variability or assoc  mucus plugs or hemoptysis or cp or chest tightness  or overt sinus or hb symptoms.   Sleeping  without nocturnal  or early am exacerbation  of respiratory  c/o's or need for noct saba. Also denies any obvious fluctuation of symptoms with weather or environmental changes or other aggravating or alleviating factors except as outlined above   No unusual exposure hx or h/o childhood pna/ asthma or knowledge of premature birth.  Current Allergies, Complete Past Medical History, Past Surgical History, Family History, and Social History were reviewed  in [employer] record.  ROS  The following are not active complaints unless bolded Hoarseness, sore throat, dysphagia, dental problems, itching, sneezing,  nasal congestion or discharge of excess mucus or purulent secretions, ear ache,   fever, chills, sweats, unintended wt loss or wt gain, classically pleuritic or exertional cp,  orthopnea pnd or arm/hand swelling  or leg swelling, presyncope, palpitations, abdominal pain, anorexia, nausea, vomiting, diarrhea  or change in bowel habits or change in bladder habits, change in stools or change in urine, dysuria, hematuria,  rash, arthralgias, visual complaints, headache, numbness, weakness or ataxia or problems with walking or coordination,  change in mood or  memory.        Current Meds  Medication Sig   Albuterol Sulfate  108 (90 Base) MCG/ACT AEPB Inhale 1-2 puffs into the lungs every 6 (six) hours as needed (wheezing/shortness of breath).    aspirin EC 81 MG tablet Take 81 mg by mouth daily.   furosemide (LASIX) 20 MG tablet Take 1 tablet (20 mg total) by mouth daily.   ibuprofen (ADVIL) 800 MG tablet TAKE 1 TABLET BY MOUTH EVERY 8 HOURS AS NEEDED   isosorbide mononitrate (IMDUR) 30 MG 24 hr tablet Take 1 tablet (30 mg total) by mouth daily.   metoprolol succinate (TOPROL-XL) 25 MG 24 hr tablet Take 1 tablet (25 mg total) by mouth daily.   omeprazole (PRILOSEC) 40 MG capsule Take 40 mg by mouth 2 (two) times a day.    OXYGEN 2LPM AS NEEDED PER PT   rosuvastatin (CRESTOR) 10 MG tablet Take 1 tablet (10 mg total) by mouth daily.   SYMBICORT 160-4.5 MCG/ACT inhaler Inhale 2 puffs into the lungs 2 (two) times daily.   tamsulosin (FLOMAX) 0.4 MG CAPS capsule Take 1 capsule (0.4 mg total) by mouth daily.                       Objective:   Physical Exam  amb wm quite anxious/ marked pseudowheeze/ better with plm   07/19/2019          196  05/10/2019            192  12/03/2018          189 10/22/2018      184 09/21/2018      179  04/28/2018        173  07/30/2017        173  05/29/17 168 lb (76.2 kg)  05/01/17 164 lb (74.4 kg)  04/15/17 162 lb (73.5 kg)     Vital signs reviewed - Note on arrival 02 sats  97% on RA      reports: full dentures   HEENT : pt wearing mask not removed for exam due to covid - 19 concerns.   NECK :  without JVD/Nodes/TM/ nl carotid upstrokes bilaterally   LUNGS: no acc muscle use,  Mod barrel  contour chest wall with bilateral  Distant bs s audible wheeze and  without cough on insp or exp maneuvers and mod  Hyperresonant  to  percussion bilaterally     CV:  RRR  no s3 or murmur or increase in P2, and no edema   ABD:  soft and nontender with pos mid insp Hoover's  in the supine position. No bruits or organomegaly appreciated, bowel sounds nl  MS:     ext  warm without deformities, calf tenderness, cyanosis or clubbing No obvious joint restrictions   SKIN:  warm and dry without lesions    NEURO:  alert, approp, nl sensorium with  no motor or cerebellar deficits apparent.     CXR PA and Lateral:   07/19/2019 :    I personally reviewed images and agree with radiology impression as follows:    No active cardiopulmonary disease. Stable chronic pleural and parenchymal disease in the right thorax.      Labs ordered/ reviewed:      Chemistry      Component Value Date/Time   NA 139 07/19/2019 1508   NA 139 04/25/2019 1632   K 3.7 07/19/2019 1508   CL 98 07/19/2019 1508   CO2 33 (H) 07/19/2019 1508   BUN 13 07/19/2019 1508   BUN 11 04/25/2019 1632   CREATININE 1.27 07/19/2019 1508      Component Value Date/Time   CALCIUM 9.4 07/19/2019 1508                             Lab Results  Component Value Date   WBC 4.4 07/19/2019   HGB 12.0 (L) 07/19/2019   HCT 34.8 (L) 07/19/2019   MCV 98.3 07/19/2019   PLT 137.0 (L) 07/19/2019       EOS                                                              0.1                                     07/19/2019   Lab Results  Component Value Date   DDIMER 0.91 (H) 07/19/2019      Lab Results  Component Value Date   TSH 6.36 (H) 07/19/2019     Lab Results  Component Value Date   PROBNP 108.0 (H) 07/19/2019             Labs ordered 07/19/2019  :      alpha one AT phenotype              Assessment & Plan:

## 2019-07-19 NOTE — Patient Instructions (Addendum)
Omeprazole 40 mg  Take 30- 60 min before your first and last meals of the day   GERD (REFLUX)  is an extremely common cause of respiratory symptoms just like yours , many times with no obvious heartburn at all.    It can be treated with medication, but also with lifestyle changes including elevation of the head of your bed (ideally with 6 -8inch blocks under the headboard of your bed),  Smoking cessation, avoidance of late meals, excessive alcohol, and avoid fatty foods, chocolate, peppermint, colas, red wine, and acidic juices such as orange juice.  NO MINT OR MENTHOL PRODUCTS SO NO COUGH DROPS  USE SUGARLESS CANDY INSTEAD (Jolley ranchers or Stover's or Life Savers) or even ice chips will also do - the key is to swallow to prevent all throat clearing. NO OIL BASED VITAMINS - use powdered substitutes.  Avoid fish oil when coughing.    Prednisone 10 mg take  4 each am x 2 days,   2 each am x 2 days,  1 each am x 2 days and stop  Plan A = Automatic/Always >>>  symbicort 160 x 2 pffs, chase with 2 puffs of spiriva and then 12 hours just use the symbicort 160 x 2 puffs   Plan B = Backup Only use your albuterol inhaler as a rescue medication to be used if you can't catch your breath by resting or doing a relaxed purse lip breathing pattern.  - The less you use it, the better it will work when you need it. - Ok to use the inhaler up to 2 puffs  every 4 hours if you must but call for appointment if use goes up over your usual need - Don't leave home without it !!  (think of it like the spare tire for your car)   Plan C = Crisis - only use your albuterol nebulizer if you first try Plan B and it fails to help > ok to use the nebulizer up to every 4 hours but if start needing it regularly call for immediate appointment   Please remember to go to the lab and x-ray department   for your tests - we will call you with the results when they are available.     Please schedule a follow up office visit in  2 weeks, call sooner if needed with all medications /inhalers/ solutions in hand so we can verify exactly what you are taking. This includes all medications from all doctors and over the [place] separate them into two bags:  the ones you take automatically, no matter what, vs the ones you take just when you feel you need them "BAG #2 is UP TO YOU"  - this will really help [name] help you take your medications more effectively.      Marland Kitchen

## 2019-07-20 ENCOUNTER — Encounter: Payer: Self-pay | Admitting: Internal Medicine

## 2019-07-20 DIAGNOSIS — J9611 Chronic respiratory failure with hypoxia: Secondary | ICD-10-CM | POA: Insufficient documentation

## 2019-07-20 LAB — ALPHA-1 ANTITRYPSIN PHENOTYPE

## 2019-07-20 LAB — D-DIMER, QUANTITATIVE: D-Dimer, Quant: 0.91 mcg/mL FEU — ABNORMAL HIGH (ref <0.50)

## 2019-07-20 NOTE — Assessment & Plan Note (Signed)
Quit smoking 06/2018  -  Spirometry 05/29/2017  FEV1 1.37 (39%)  Ratio 47 p am symb x2 and spirva x one and abn effort dep portion  - 05/29/2017   > rec  change  spiriva to  respimat and symb 160 2bid s spacer   - PFT's  07/30/2017  FEV1 1.39 (38 % ) ratio 48  p 23 % improvement from saba p nothing prior to study with DLCO  39/42 % corrects to 59 % for alv volume  - PFT's  10/22/2018  FEV1 1.26 (35 % ) ratio 44  p 14 % improvement from saba p symb x2  prior to study with DLCO  37/43c % corrects to 67 % for alv volume  - alpha one AT screen 12/03/2018   GM  Level 155  - 12/15/2018 notified that his insurance doesn't cover rehab  07/19/2019  After extensive coaching inhaler device,  effectiveness =    90% with SMI > restart   Clinical course c/w  Group D in terms of symptom/risk and laba/lama/ICS  therefore appropriate rx at this point >>>  Add spiriva and consider for breztri when available   Advised:  formulary restrictions will be an ongoing challenge for the forseable future and I would be happy to pick an alternative if the pt will first  provide me a list of them -  pt  will need to return here for training for any new device that is required eg dpi vs hfa vs respimat.    In the meantime we can always provide samples so that the patient never runs out of any needed respiratory medications.

## 2019-07-20 NOTE — Assessment & Plan Note (Signed)
Echo 10/07/18 Left ventricle: The cavity size was normal. There was mild   concentric hypertrophy. Systolic function was normal. The   estimated ejection fraction was in the range of 60% to 65%. Wall   motion was normal; there were no regional wall motion   abnormalities. Doppler parameters are consistent with abnormal   left ventricular relaxation (grade 1 diastolic dysfunction).   There was no evidence of elevated ventricular filling pressure by   Doppler parameters. - Aortic valve: Trileaflet; mildly thickened leaflets. There was   moderate regurgitation. - Mitral valve: There was mild regurgitation. - Left atrium: The atrium was normal in size. - Right ventricle: Systolic function was normal. - Tricuspid valve: There was mild regurgitation. - Pulmonic valve: There was no regurgitation. - Pulmonary arteries: Systolic pressure was within the normal   range. - Inferior vena cava: The vessel was normal in size. - Pericardium, extracardiac: There was no pericardial effusion.  Not clear to what extent this is  All a  pulmonary  problem but pt does appear to have difficult to sort out respiratory symptoms of unknown origin for which  DDX  = almost all start with A and  include Adherence, Ace Inhibitors, Acid Reflux, Active Sinus Disease, Alpha 1 Antitripsin deficiency, Anxiety masquerading as Airways dz,  ABPA,  Allergy(esp in young), Aspiration (esp in elderly), Adverse effects of meds,  Active smoking or Vaping, A bunch of PE's/clot burden (a few small clots can't cause this syndrome unless there is already severe underlying pulm or vascular dz with poor reserve),  Anemia or thyroid disorder, plus two Bs  = Bronchiectasis and Beta blocker use..and one C= CHF    Adherence is always the initial "prime suspect" and is a multilayered concern that requires a "trust but verify" approach in every patient - starting with knowing how to use medications, especially inhalers, correctly, keeping up with  refills and understanding the fundamental difference between maintenance and prns vs those medications only taken for a very short course and then stopped and not refilled.  - see device teaching above - return with all meds in hand using a trust but verify approach to confirm accurate Medication  Reconciliation The principal here is that until we are certain that the  patients are doing what we've asked, it makes no sense to ask them to do more.   ? Acid (or non-acid) GERD > always difficult to exclude as up to 75% of pts in some series report no assoc GI/ Heartburn symptoms> rec max (24h)  acid suppression and diet restrictions/ reviewed and instructions given in writing.   ? Anxiety/depression/ cognitive issues (does not recall me telling him to call if breathing worse off spiriva)  > usually at the bottom of this list of usual suspects but should be   higher on this pt's based on H and P and   may interfere with adherence and also interpretation of response or lack thereof to symptom management which can be quite subjective.   ? Allergy/asthma component > doubt with Eos so low but worth short course trial:  Prednisone 10 mg take  4 each am x 2 days,   2 each am x 2 days,  1 each am x 2 days and stop   ? Anemia, thyroid dz > tsh up which usually not a cause of sob but can cause fatigue/ depression> defer optional rx to PCP  ? A bunch of PE's > D dimer nl - while a normal  or  high normal value (seen commonly in the elderly or chronically ill)  may miss small peripheral pe, the clot burden with sob is moderately high and the d dimer  has a very high neg pred value if used in this setting.    ? Chf/ ihd > see cards w/u, bnp  quite low so doubt diastolic dysfunction contributing to sob    I had an extended discussion with the patient reviewing all relevant studies completed to date and  lasting 25 minutes of a 40  Minute acute office  visit     re  severe non-specific but potentially very serious  refractory respiratory symptoms of uncertain and potentially multiple  Etiologies.  I performed device teaching  using a teach back technique which also  extended face to face time for this visit (see above)    Each maintenance medication was reviewed in detail including most importantly the difference between maintenance and prns and under what circumstances the prns are to be triggered using an action plan format that is not reflected in the computer generated alphabetically organized AVS.    Please see AVS for specific instructions unique to this office visit that I personally wrote and verbalized to the the pt in detail and then reviewed with pt  by my nurse highlighting any changes in therapy/plan of care  recommended at today's visit.      Chase Lloyd

## 2019-07-20 NOTE — Assessment & Plan Note (Addendum)
HC03     07/19/2019  = 33   Though somewhat paradoxic, when the lung fails to clear C02 properly and pC02 rises the lung then becomes a more efficient scavenger of C02 allowing lower work of breathing and  better C02 clearance albeit at a higher serum pC02 level - this is why pts can look a lot better than their ABG's would suggest and why it's so difficult to prognosticate endstage dz.  It's also why I strongly rec DNI status (ventilating pts down to a nl pC02 adversely affects this compensatory mechanism) - although NIV has anecdotally been used in this setting, I  Don't believe it's a good choice for anxious patients.    >>> Advised to monitor sats since has home 02 and titrate to > 90% and will do walking study on return

## 2019-07-21 ENCOUNTER — Telehealth: Payer: Self-pay | Admitting: Internal Medicine

## 2019-07-21 MED ORDER — OMEPRAZOLE 40 MG PO CPDR: 40.0000 mg | DELAYED_RELEASE_CAPSULE | Freq: Two times a day (BID) | ORAL | 11 refills | Status: DC

## 2019-07-21 NOTE — Telephone Encounter (Signed)
Spoke with pt. He is needing a refill on Omeprazole. Rx has been sent in. Nothing further was needed at this time.

## 2019-07-26 IMAGING — MR MR LUMBAR SPINE W/O CM
4 of 5 series · 18 of 48 positions shown · non-contrast
Comparison: 07/09/2017 lumbar spine MRI.

CLINICAL DATA: 61 y/o M; severe back pain. Lumbar surgery 2 days
ago.

EXAM:
MRI LUMBAR SPINE WITHOUT CONTRAST
TECHNIQUE: Multiplanar, multisequence MR imaging of the lumbar spine was
performed. No intravenous contrast was administered.

[Series 2: T2 · sagittal · 4.0mm · 0.55mm/px · 6 of 14 slices shown (1 of 2)]
[im 1/14]
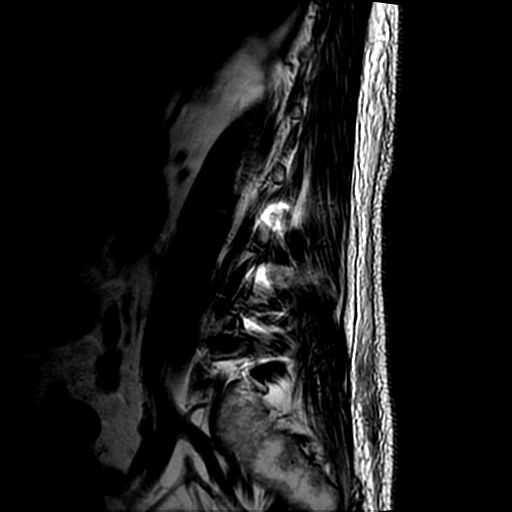
[im 3/14]
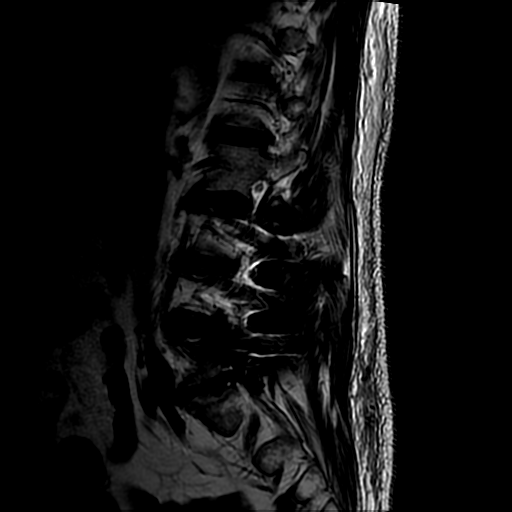
[im 6/14]
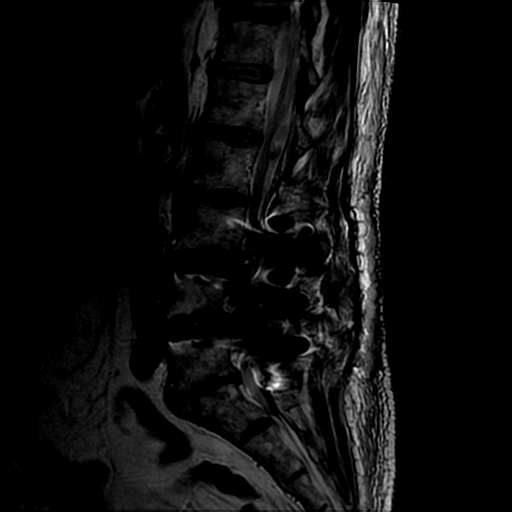
[im 8/14]
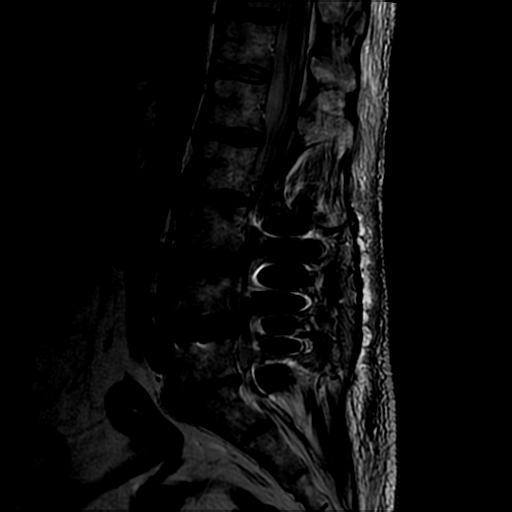
[im 11/14]
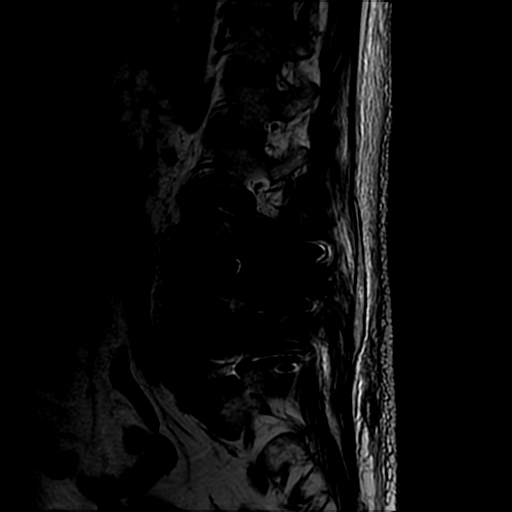
[im 14/14]
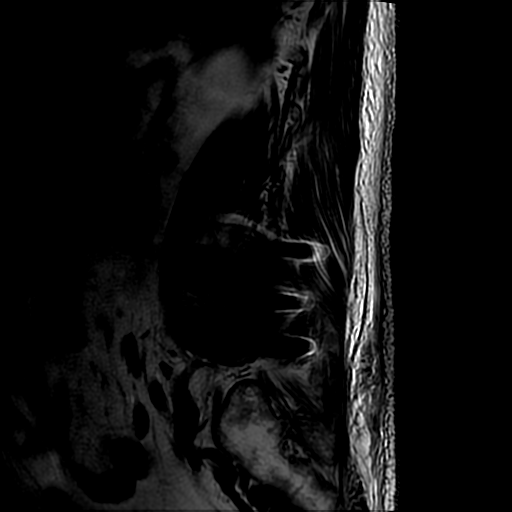

[Series 4: T1 · sagittal · 4.0mm · 0.55mm/px · 3 of 14 slices shown (1 of 2)]
[im 1/14]
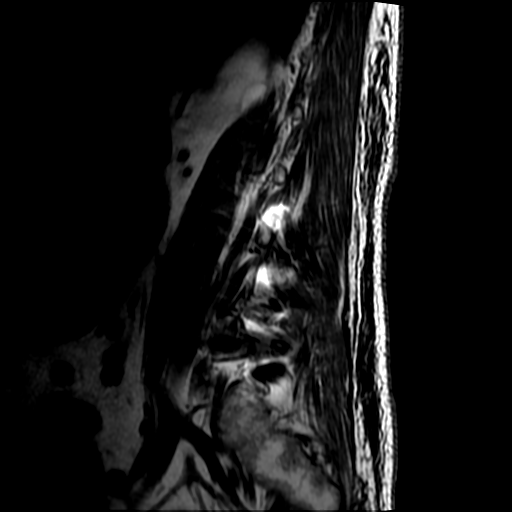
[im 7/14]
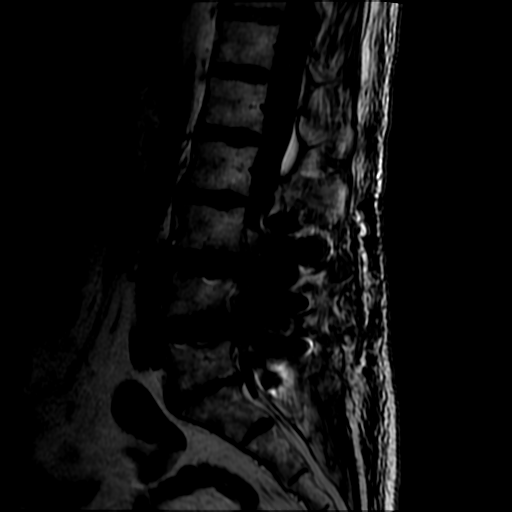
[im 14/14]
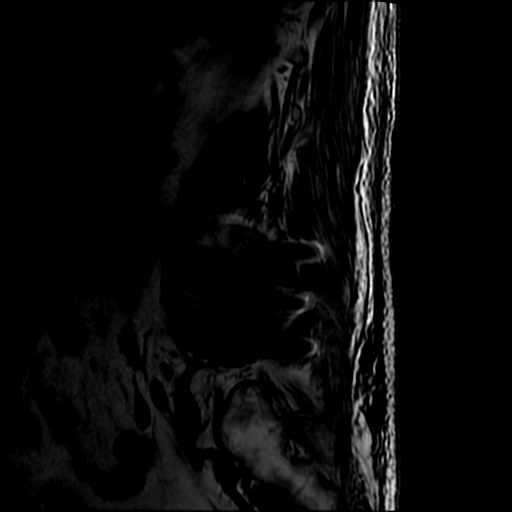

[Series 5: T2 · axial · 4.0mm · 0.39mm/px · z∈[-69,+96]mm · 6 of 42 slices shown (2 of 2)]
[im 3/42]
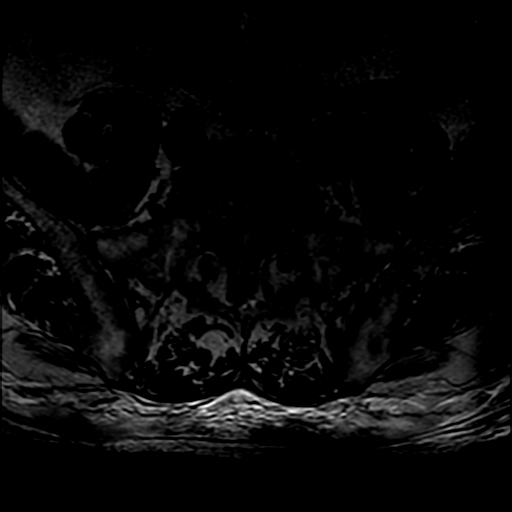
[im 6/42]
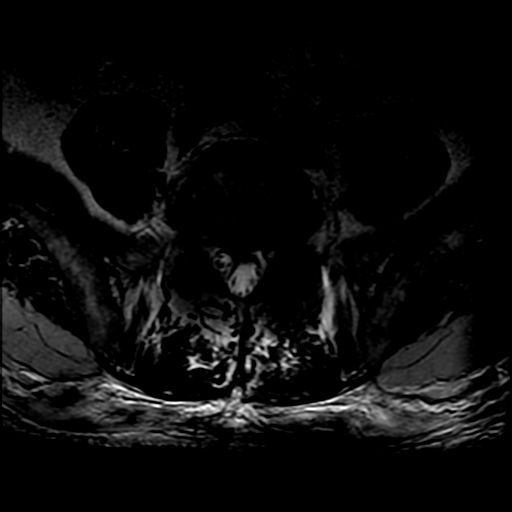
[im 9/42]
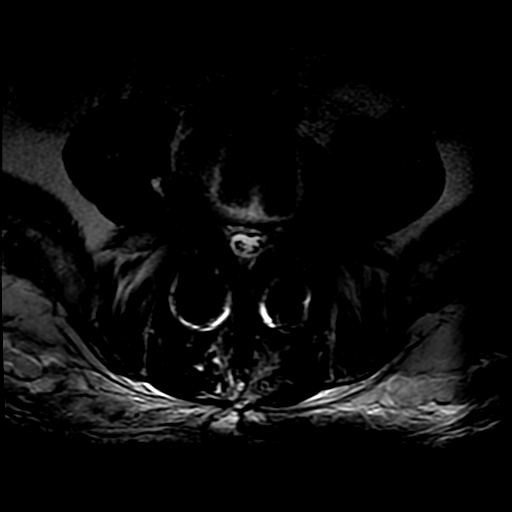
[im 14/42]
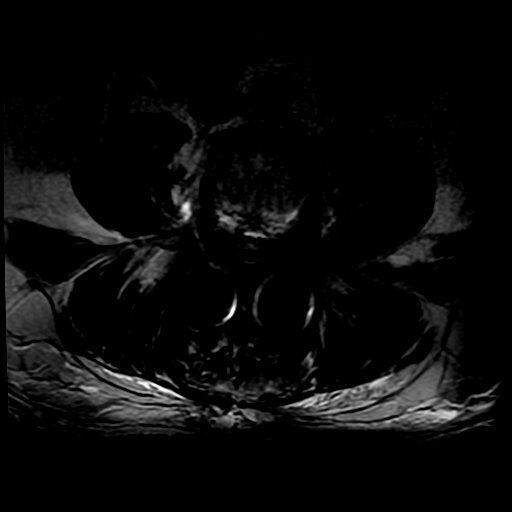
[im 22/42]
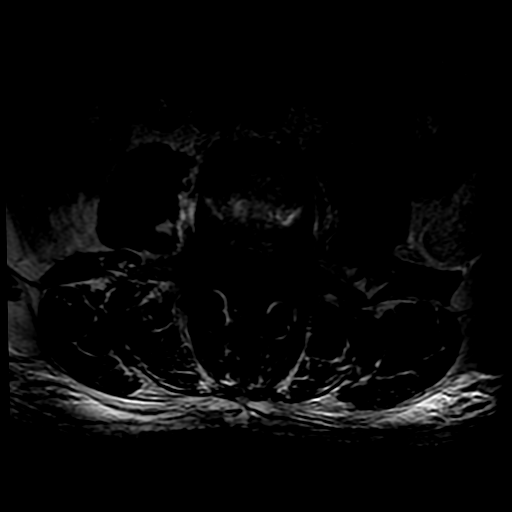
[im 36/42]
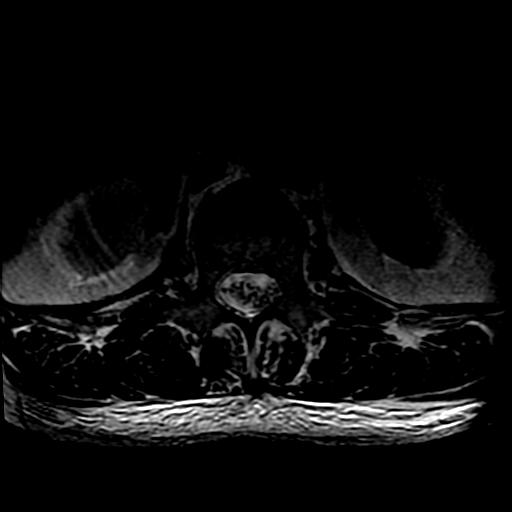

[Series 6: T1 · axial · 4.0mm · 0.39mm/px · z∈[-54,+96]mm · 3 of 42 slices shown (2 of 2)]
[im 6/42]
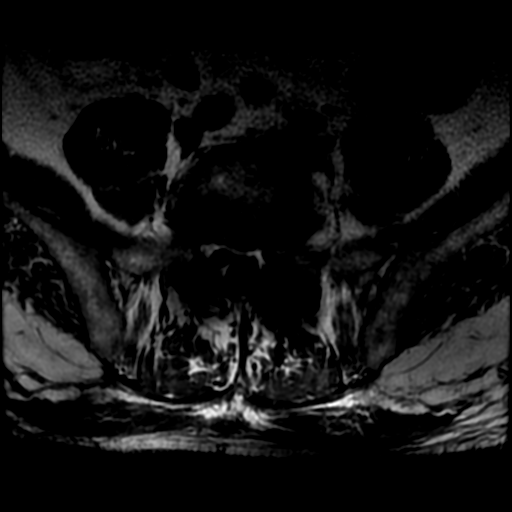
[im 22/42]
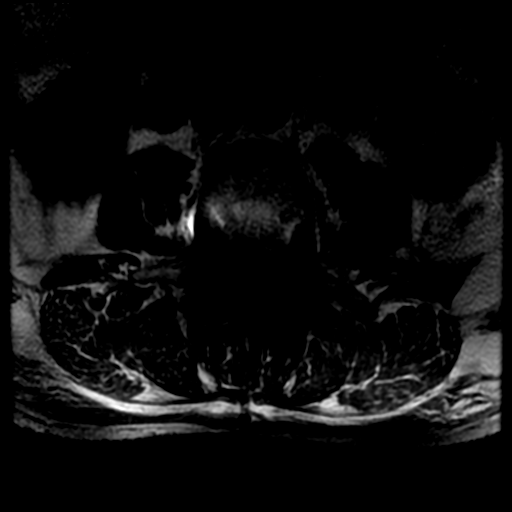
[im 36/42]
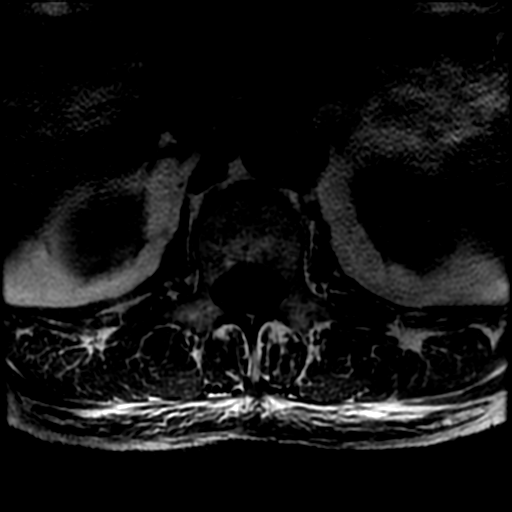

[18 of 48 positions shown; findings below may reference images not displayed]

FINDINGS: Segmentation:  Standard.

Alignment:  Physiologic.

Vertebrae: Interval L3-L5 posterior instrumented fusion, interbody
fusion, and laminectomy. Susceptibility artifact from fusion
hardware partially obscures the vertebral bodies and spinal canal
through the levels of fusion. No vertebral body edema, fracture, or
findings of discitis.

Conus medullaris and cauda equina: Conus extends to the L1 level.
There is an extra arachnoid fluid collection largely following CSF
signal to the right and posterior of the cauda equina extending from
the upper L1 level to lower L2 level displacing the cauda equina
nerve roots which is new from the preoperative lumbar spine MRI
(series 2, image 6 and series 5, image 9). There is amorphous mixed
T1-T2 signal centrally within the collection which may represent
blood products or flow artifact.

Paraspinal and other soft tissues: Mild edema within the paraspinal
muscles and subcutaneous fat through the levels of surgery and small
fluid collections within the subcutaneous fat compatible
postoperative seroma.

Disc levels:

L1-2:  Mild disc bulge.  No significant foraminal or canal stenosis.

L2-3: Mild disc bulge and facet hypertrophy. No significant
foraminal or canal stenosis.

L3-4: Interval discectomy and posterior fusion. The spinal canal and
neural foramen are largely obscured by susceptibility artifact from
fusion hardware, but appear patent.

L4-5: Interval discectomy and posterior fusion. The spinal canal and
neural foramen are largely obscured by susceptibility artifact from
fusion hardware, but appear patent.

L5-S1: Stable disc bulge with endplate marginal osteophytes
eccentric to the left as well as left-greater-than-right facet
hypertrophy. Mild right and moderate left foraminal stenosis. No
significant canal stenosis.
IMPRESSION: 1. Fluid collection with predominantly CSF signal to the right and
posterior cauda equina with mass effect on cauda equina extending
from upper L1 to lower L2, possibly representing a spinal subdural
extra-arachnoid hygroma or CSF leak. No adjacent edema to suggest
infection. Amorphous mixed T1-T2 signal centrally within the
collection which may represent blood products or flow artifact.
2. Interval L3-L5 posterior and interbody fusion with laminectomy.
No acute osseous abnormality. Expected postoperative changes within
the posterior lumbar soft tissues.

By: Jezza Mudasar M.D.

## 2019-07-30 ENCOUNTER — Other Ambulatory Visit: Payer: Self-pay

## 2019-07-30 ENCOUNTER — Encounter (HOSPITAL_COMMUNITY): Payer: Self-pay | Admitting: *Deleted

## 2019-07-30 ENCOUNTER — Emergency Department (HOSPITAL_COMMUNITY): Payer: Medicare Other

## 2019-07-30 ENCOUNTER — Emergency Department (HOSPITAL_COMMUNITY)
Admission: EM | Admit: 2019-07-30 | Discharge: 2019-07-30 | Disposition: A | Discharge status: Home / Self Care | Payer: Medicare Other | Attending: Emergency Medicine | Admitting: Emergency Medicine

## 2019-07-30 DIAGNOSIS — K219 Gastro-esophageal reflux disease without esophagitis: Secondary | ICD-10-CM | POA: Insufficient documentation

## 2019-07-30 DIAGNOSIS — J441 Chronic obstructive pulmonary disease with (acute) exacerbation: Secondary | ICD-10-CM | POA: Diagnosis not present

## 2019-07-30 DIAGNOSIS — R0602 Shortness of breath: Secondary | ICD-10-CM | POA: Diagnosis not present

## 2019-07-30 DIAGNOSIS — Z79899 Other long term (current) drug therapy: Secondary | ICD-10-CM | POA: Insufficient documentation

## 2019-07-30 DIAGNOSIS — Z8571 Personal history of Hodgkin lymphoma: Secondary | ICD-10-CM | POA: Insufficient documentation

## 2019-07-30 DIAGNOSIS — Z20828 Contact with and (suspected) exposure to other viral communicable diseases: Secondary | ICD-10-CM | POA: Insufficient documentation

## 2019-07-30 DIAGNOSIS — Z87891 Personal history of nicotine dependence: Secondary | ICD-10-CM | POA: Diagnosis not present

## 2019-07-30 DIAGNOSIS — R05 Cough: Secondary | ICD-10-CM | POA: Insufficient documentation

## 2019-07-30 LAB — CBC WITH DIFFERENTIAL/PLATELET
Abs Immature Granulocytes: 0.07 10*3/uL (ref 0.00–0.07)
Basophils Absolute: 0 10*3/uL (ref 0.0–0.1)
Basophils Relative: 1 %
Eosinophils Absolute: 0 10*3/uL (ref 0.0–0.5)
Eosinophils Relative: 1 %
HCT: 33.5 % — ABNORMAL LOW (ref 39.0–52.0)
Hemoglobin: 11.3 g/dL — ABNORMAL LOW (ref 13.0–17.0)
Immature Granulocytes: 1 %
Lymphocytes Relative: 12 %
Lymphs Abs: 0.6 10*3/uL — ABNORMAL LOW (ref 0.7–4.0)
MCH: 33.9 pg (ref 26.0–34.0)
MCHC: 33.7 g/dL (ref 30.0–36.0)
MCV: 100.6 fL — ABNORMAL HIGH (ref 80.0–100.0)
Monocytes Absolute: 0.4 10*3/uL (ref 0.1–1.0)
Monocytes Relative: 7 %
Neutro Abs: 4.1 10*3/uL (ref 1.7–7.7)
Neutrophils Relative %: 78 %
Platelets: 107 10*3/uL — ABNORMAL LOW (ref 150–400)
RBC: 3.33 MIL/uL — ABNORMAL LOW (ref 4.22–5.81)
RDW: 12.9 % (ref 11.5–15.5)
WBC: 5.2 10*3/uL (ref 4.0–10.5)
nRBC: 0 % (ref 0.0–0.2)

## 2019-07-30 LAB — TROPONIN I (HIGH SENSITIVITY)
Troponin I (High Sensitivity): 12 ng/L (ref <18)
Troponin I (High Sensitivity): 11 ng/L (ref <18)

## 2019-07-30 LAB — D-DIMER, QUANTITATIVE: D-Dimer, Quant: 1.12 ug/mL-FEU — ABNORMAL HIGH (ref 0.00–0.50)

## 2019-07-30 LAB — COMPREHENSIVE METABOLIC PANEL
ALT: 32 U/L (ref 0–44)
AST: 31 U/L (ref 15–41)
Albumin: 3.3 g/dL — ABNORMAL LOW (ref 3.5–5.0)
Alkaline Phosphatase: 75 U/L (ref 38–126)
Anion gap: 9 (ref 5–15)
BUN: 10 mg/dL (ref 8–23)
CO2: 29 mmol/L (ref 22–32)
Calcium: 8.7 mg/dL — ABNORMAL LOW (ref 8.9–10.3)
Chloride: 101 mmol/L (ref 98–111)
Creatinine, Ser: 1.24 mg/dL (ref 0.61–1.24)
GFR calc Af Amer: >60 mL/min (ref >60)
GFR calc non Af Amer: >60 mL/min (ref >60)
Glucose, Bld: 89 mg/dL (ref 70–99)
Potassium: 3.6 mmol/L (ref 3.5–5.1)
Sodium: 139 mmol/L (ref 135–145)
Total Bilirubin: 1.2 mg/dL (ref 0.3–1.2)
Total Protein: 6.4 g/dL — ABNORMAL LOW (ref 6.5–8.1)

## 2019-07-30 LAB — SARS CORONAVIRUS 2 BY RT PCR (HOSPITAL ORDER, PERFORMED IN ~~LOC~~ HOSPITAL LAB): SARS Coronavirus 2: NEGATIVE

## 2019-07-30 MED ORDER — PREDNISONE 20 MG PO TABS: 40.0000 mg | ORAL_TABLET | Freq: Every day | ORAL | 0 refills | Status: AC

## 2019-07-30 MED ORDER — METHYLPREDNISOLONE SODIUM SUCC 125 MG IJ SOLR
125.0000 mg | Freq: Once | INTRAMUSCULAR | Status: AC
  Administered 2019-07-30: 125 mg via INTRAVENOUS
  Filled 2019-07-30: qty 2

## 2019-07-30 MED ORDER — ONDANSETRON HCL 4 MG/2ML IJ SOLN
4.0000 mg | Freq: Once | INTRAMUSCULAR | Status: AC
  Administered 2019-07-30: 4 mg via INTRAVENOUS
  Filled 2019-07-30: qty 2

## 2019-07-30 MED ORDER — ALBUTEROL SULFATE (2.5 MG/3ML) 0.083% IN NEBU
2.5000 mg | INHALATION_SOLUTION | Freq: Once | RESPIRATORY_TRACT | Status: DC
  Filled 2019-07-30: qty 3

## 2019-07-30 MED ORDER — IOHEXOL 350 MG/ML SOLN
75.0000 mL | Freq: Once | INTRAVENOUS | Status: AC | PRN
  Administered 2019-07-30: 75 mL via INTRAVENOUS

## 2019-07-30 MED ORDER — AZITHROMYCIN 250 MG PO TABS: 250.0000 mg | ORAL_TABLET | Freq: Every day | ORAL | 0 refills | Status: DC

## 2019-07-30 MED ORDER — MORPHINE SULFATE (PF) 4 MG/ML IV SOLN
4.0000 mg | Freq: Once | INTRAVENOUS | Status: AC
  Administered 2019-07-30: 4 mg via INTRAVENOUS
  Filled 2019-07-30: qty 1

## 2019-07-30 NOTE — Discharge Instructions (Signed)
Take prednisone as directed.  Continue using nebulizers inhalers.  Follow-up with your pulmonologist.  Return emergency department for any difficulty breathing, chest pain, fevers or any other worsening concerning symptoms.

## 2019-07-30 NOTE — ED Provider Notes (Signed)
  Physical Exam  BP 131/71   Pulse 82   Temp 98.7 F (37.1 C) (Oral)   Resp 19   Ht 5\' 11"  (1.803 m)   Wt 88.5 kg   SpO2 99%   BMI 27.20 kg/m   Physical Exam Vitals signs and nursing note reviewed.  Constitutional:      General: He is not in acute distress.    Appearance: He is well-developed. He is not diaphoretic.  HENT:     Head: Normocephalic and atraumatic.  Eyes:     General: No scleral icterus.    Conjunctiva/sclera: Conjunctivae normal.  Neck:     Musculoskeletal: Normal range of motion.  Pulmonary:     Effort: Pulmonary effort is normal. No respiratory distress.  Skin:    Findings: No rash.  Neurological:     Mental Status: He is alert.     ED Course/Procedures     Procedures  MDM  Care handed off from previous provider PA Leyden.  Please see their note for further detail.  Briefly, patient is a 63 year old male with a past medical history of COPD, emphysema, GERD, Hodgkin's disease presenting to ED for fatigue, shortness of breath and chest pain.  Lab work including troponin has been unremarkable here.  Admits that he has not been compliant with his home supplemental oxygen.  Plan is to obtain delta troponin, ambulate with pulse oximetry and reassess.  If he feels improved, no hypoxia will discharge home with antibiotics and steroids.  5:06 PM Patient ambulated on 2 L of oxygen with oxygen saturations above 99%.  He denies any shortness of breath.  Delta troponin is negative as well.  Patient agreeable to discharge home with pulmonology and PCP follow-up.   Patient is hemodynamically stable, in NAD, and able to ambulate in the ED. Evaluation does not show pathology that would require ongoing emergent intervention or inpatient treatment. I explained the diagnosis to the patient. Pain has been managed and has no complaints prior to discharge. Patient is comfortable with above plan and is stable for discharge at this time. All questions were answered prior to  disposition. Strict return precautions for returning to the ED were discussed. Encouraged follow up with PCP.   An After Visit Summary was printed and given to the patient.   Portions of this note were generated with L[employer]. Dictation errors may occur despite best attempts at proofreading.        Delia Heady, PA-C 07/30/19 1707    Noemi Chapel, MD 07/31/19 1524

## 2019-07-30 NOTE — ED Notes (Signed)
Patient transported to CT 

## 2019-07-30 NOTE — ED Provider Notes (Signed)
[facility] EMERGENCY DEPARTMENT Provider Note   CSN: [id number] Arrival date & time: 07/30/19  1028     History   Chief Complaint Chief Complaint  Patient presents with   Shortness of Breath    HPI OLEY DALLIS is a 63 y.o. male possible history of COPD, DDD, emphysema, GERD, Hodgkin's disease who presents for evaluation of fatigue, difficulty breathing, chest pain.  He states that over the last 2 days, he just "has not felt right.  Patient states he felt fatigued and felt like he had no energy.  Additionally, he stated that he was having some worsening shortness of breath.  He has stage III COPD so he always has some degree of difficulty breathing but felt like it was worse in the last couple days.  He also reports that today, he started developing some chest pain.  He states that he only has the chest pain when he coughs or has deep inspiration.  No other pain.  No associated diaphoresis, nausea/vomiting.  Patient states he is on intermittent home O2 and his baseline is 2 L.  Question if he is supposed to be on a continuous but patient states he "does not want to become dependent on it so he uses it intermittently."  Wife made him come to the emergency department after he was continuing to have pain.  Additionally, she noticed an episode where his bilateral lower extremities appeared pale.  He has not noted any fevers.  He denies any recent surgeries, recent travel, history of blood clots.  He has some leg swelling bilaterally which he states is not abnormal for him.  He has not noticed any asymmetric leg swelling or overlying warmth or erythema.  He denies any known COVID-19 exposure.    The history is provided by the patient.    Past Medical History:  Diagnosis Date   Back pain    COPD (chronic obstructive pulmonary disease) (HCC)    DDD (degenerative disc disease), lumbar    Depression    Emphysema of lung ([place])    Empyema ([place])    2016  (was in [place])    GERD (gastroesophageal reflux disease)    Hearing loss of right ear    started 2001.     microphone in right ear ,  and hearing aid in left   Hodgkin's disease ([place]) 1999   Hypotension    Meniere's disease    Pneumonia    Tinnitus     Patient Active Problem List   Diagnosis Date Noted   Chronic respiratory failure with hypoxia and hypercapnia (HCC) 07/20/2019   Abnormal cardiac CT angiography    CAD (coronary artery disease) 01/06/2019   Chronic respiratory failure with hypoxia ([place]) 12/04/2018   COPD GOLD III  10/23/2018   Mixed restrictive and obstructive lung disease ([place]) 10/16/2018   Dyspnea on exertion 10/08/2018   Hypertensive urgency 10/07/2018   Troponin I above reference range 10/07/2018   Acute respiratory distress 10/06/2018   Essential hypertension 09/22/2018   Low back pain 07/18/2018   Traumatic subdural hematoma of neuraxis ([place]) 07/18/2018   Lumbar spinal stenosis 07/16/2018   GERD (gastroesophageal reflux disease) 04/26/2018   Intermittent claudication ([place]) 06/12/2017   Lumbar spondylosis 06/12/2017   COPD with acute exacerbation ([place]) 05/29/2017   Tendinopathy of left gluteus medius 05/01/2017   Former cigarette smoker - 70 pack year hx 05/01/2017    Past Surgical History:  Procedure Laterality Date   BACK SURGERY  CERVICAL FUSION  2016   ELBOW SURGERY     EYE SURGERY     IR THORACENTESIS ASP PLEURAL SPACE W/IMG GUIDE  10/08/2018   LAMINECTOMY  1999   lower back surgery     RIGHT/LEFT HEART CATH AND CORONARY ANGIOGRAPHY N/A 04/29/2019   Procedure: RIGHT/LEFT HEART CATH AND CORONARY ANGIOGRAPHY;  Surgeon: Troy Sine, MD;  Location: [facility];  Service: Cardiovascular;  Laterality: N/A;   TONSILLECTOMY          Home Medications    Prior to Admission medications   Medication Sig Start Date End Date Taking? Authorizing Provider  albuterol (PROVENTIL) (2.5 MG/3ML) 0.083% nebulizer solution Take  3 mLs (2.5 mg total) by nebulization every 4 (four) hours as needed for wheezing or shortness of breath. 07/19/19   Tanda Rockers, MD  Albuterol Sulfate 108 (90 Base) MCG/ACT AEPB Inhale 1-2 puffs into the lungs every 6 (six) hours as needed (wheezing/shortness of breath). 07/19/19   Nafziger, Tommi Rumps, NP  aspirin EC 81 MG tablet Take 81 mg by mouth daily.    [provider]  azithromycin (ZITHROMAX) 250 MG tablet Take 1 tablet (250 mg total) by mouth daily. Take first 2 tablets together, then 1 every day until finished. 07/30/19   Volanda Napoleon, PA-C  furosemide (LASIX) 20 MG tablet Take 1 tablet (20 mg total) by mouth daily. 10/12/18 10/12/19  Elgergawy, Silver Huguenin, MD  ibuprofen (ADVIL) 800 MG tablet TAKE 1 TABLET BY MOUTH EVERY 8 HOURS AS NEEDED 07/19/19   Nafziger, Tommi Rumps, NP  isosorbide mononitrate (IMDUR) 30 MG 24 hr tablet Take 1 tablet (30 mg total) by mouth daily. 04/29/19   Bhagat, Crista Luria, PA  metoprolol succinate (TOPROL-XL) 25 MG 24 hr tablet Take 1 tablet (25 mg total) by mouth daily. 05/13/19   Belva Crome, MD  omeprazole (PRILOSEC) 40 MG capsule Take 1 capsule (40 mg total) by mouth 2 (two) times daily. 07/21/19   Tanda Rockers, MD  OXYGEN 2LPM AS NEEDED PER PT    [provider]  predniSONE (DELTASONE) 20 MG tablet Take 2 tablets (40 mg total) by mouth daily for 4 days. 07/30/19 08/03/19  [place]idence Lanius A, PA-C  rosuvastatin (CRESTOR) 10 MG tablet Take 1 tablet (10 mg total) by mouth daily. 05/13/19 08/11/19  Belva Crome, MD  SYMBICORT 160-4.5 MCG/ACT inhaler Inhale 2 puffs into the lungs 2 (two) times daily. 05/10/19   Tanda Rockers, MD  tamsulosin (FLOMAX) 0.4 MG CAPS capsule Take 1 capsule (0.4 mg total) by mouth daily. 06/30/19   Nafziger, Tommi Rumps, NP  Tiotropium Bromide Monohydrate (SPIRIVA RESPIMAT) 2.5 MCG/ACT AERS Inhale 2 puffs into the lungs daily. 07/19/19   Nafziger, Tommi Rumps, NP  Tiotropium Bromide Monohydrate (SPIRIVA RESPIMAT) 2.5 MCG/ACT AERS Inhale 2 puffs  into the lungs daily. 07/19/19   Tanda Rockers, MD    Family History Family History  Problem Relation Age of Onset   Hypertension Father    Diabetes Father    Testicular cancer Brother    Melanoma Brother     Social History Social History   Tobacco Use   Smoking status: Former Smoker    Packs/day: 1.50    Years: 47.00    Pack years: 70.50    Types: Cigarettes    Quit date: 06/13/2018    Years since quitting: 1.1   Smokeless tobacco: Never Used  Substance Use Topics   Alcohol use: Yes    Comment: 24 cans in a  week   Drug use: No     Allergies   Patient has no known allergies.   Review of Systems Review of Systems  Constitutional: Negative for fatigue and fever.  Respiratory: Positive for cough and shortness of breath.   Cardiovascular: Positive for chest pain.  Gastrointestinal: Negative for abdominal pain, nausea and vomiting.  Genitourinary: Negative for dysuria and hematuria.  Skin: Negative for color change.  Neurological: Negative for headaches.  All other systems reviewed and are negative.    Physical Exam Updated Vital Signs BP (!) 154/69    Pulse 96    Temp 98.7 F (37.1 C) (Oral)    Resp (!) 21    Ht 5\' 11"  (1.803 m)    Wt 88.5 kg    SpO2 97%    BMI 27.20 kg/m   Physical Exam Vitals signs and nursing note reviewed.  Constitutional:      Appearance: Normal appearance. He is well-developed.  HENT:     Head: Normocephalic and atraumatic.  Eyes:     General: Lids are normal.     Conjunctiva/sclera: Conjunctivae normal.     Pupils: Pupils are equal, round, and reactive to light.  Neck:     Musculoskeletal: Full passive range of motion without pain.  Cardiovascular:     Rate and Rhythm: Normal rate and regular rhythm.     Pulses: Normal pulses.          Radial pulses are 2+ on the right side and 2+ on the left side.       Dorsalis pedis pulses are 2+ on the left side.     Heart sounds: Normal heart sounds. No murmur. No friction rub. No  gallop.      Comments: Difficulty obtaining palpable right DP pulse but able to identify it with Doppler. Pulmonary:     Effort: Pulmonary effort is normal.     Breath sounds: Normal breath sounds.     Comments: Able to speak in full sentences without distress.  No evidence of wheezing. Abdominal:     Palpations: Abdomen is soft. Abdomen is not rigid.     Tenderness: There is no abdominal tenderness. There is no guarding.  Musculoskeletal: Normal range of motion.     Comments: Bilateral lower extremities are symmetric in appearance without any overlying warmth, erythema, edema.  Skin:    General: Skin is warm and dry.     Capillary Refill: Capillary refill takes less than 2 seconds.     Comments: Good distal cap refill. BLE are not dusky in appearance or cool to touch.  Neurological:     Mental Status: He is alert and oriented to person, place, and time.     Comments: Sensation intact along major nerve distributions of BLE  Psychiatric:        Speech: Speech normal.      ED Treatments / Results  Labs (all labs ordered are listed, but only abnormal results are displayed) Labs Reviewed  COMPREHENSIVE METABOLIC PANEL - Abnormal; Notable for the following components:      Result Value   Calcium 8.7 (*)    Total Protein 6.4 (*)    Albumin 3.3 (*)    All other components within normal limits  CBC WITH DIFFERENTIAL/PLATELET - Abnormal; Notable for the following components:   RBC 3.33 (*)    Hemoglobin 11.3 (*)    HCT 33.5 (*)    MCV 100.6 (*)    Platelets 107 (*)    Lymphs Abs  0.6 (*)    All other components within normal limits  D-DIMER, QUANTITATIVE (NOT AT [facility]) - Abnormal; Notable for the following components:   D-Dimer, Quant 1.12 (*)    All other components within normal limits  SARS CORONAVIRUS 2 (HOSPITAL ORDER, PERFORMED IN [facility] LAB)  TROPONIN I (HIGH SENSITIVITY)  TROPONIN I (HIGH SENSITIVITY)    EKG EKG Interpretation  Date/Time:  Saturday  July 30 2019 10:39:33 EDT Ventricular Rate:  96 PR Interval:  146 QRS Duration: 90 QT Interval:  350 QTC Calculation: 442 R Axis:   82 Text Interpretation:  Normal sinus rhythm Cannot rule out Inferior infarct , age undetermined Abnormal ECG Confirmed by Virgel Manifold [phone]) on 07/30/2019 1:02:57 PM   Radiology Dg Chest 2 View  Result Date: 07/30/2019 CLINICAL DATA:  Shortness of breath. EXAM: CHEST - 2 VIEW COMPARISON:  Radiograph of July 19, 2019. FINDINGS: The heart size and mediastinal contours are within normal limits. No pneumothorax is noted. Left lung is clear. Stable right basilar scarring is noted with pleural thickening. The visualized skeletal structures are unremarkable. IMPRESSION: Stable right basilar scarring and pleural thickening is noted. No definite acute abnormality is noted. Electronically Signed   By: Marijo Conception M.D.   On: 07/30/2019 12:44   Ct Angio Chest Pe W And/or Wo Contrast  Result Date: 07/30/2019 CLINICAL DATA:  History of COPD. Shortness of breath. Left upper chest pain. EXAM: CT ANGIOGRAPHY CHEST WITH CONTRAST TECHNIQUE: Multidetector CT imaging of the chest was performed using the standard protocol during bolus administration of intravenous contrast. Multiplanar CT image reconstructions and MIPs were obtained to evaluate the vascular anatomy. CONTRAST:  36mL OMNIPAQUE IOHEXOL 350 MG/ML SOLN COMPARISON:  CT of the chest October 04, 2018. CTA chest October 06, 2017. FINDINGS: Cardiovascular: Mild atherosclerotic changes are seen in the nonaneurysmal aorta without dissection. Coronary artery calcifications are again identified. The heart is unchanged. No pulmonary emboli identified. Mediastinum/Nodes: The thyroid and esophagus are stable. The mediastinum is unchanged since October 06, 2018. No new or suspicious adenopathy. Lungs/Pleura: Central airways are stable. Moderate emphysematous changes in the lungs are stable. Paramediastinal radiation changes  consistent with a previous history of Hodgkin's lymphoma are stable. Again noted are small bilateral pleural effusions, right greater than left. There is rounded opacity associated with the right basilar effusion measuring up to 3.2 cm today versus 3.6 cm on the October 06, 2018 study. No new pulmonary nodules, masses, or infiltrates. Probable mild chronic edema. Upper Abdomen: No acute abnormality. Musculoskeletal: No chest wall abnormality. No acute or significant osseous findings. Review of the MIP images confirms the above findings. IMPRESSION: 1. No pulmonary emboli identified. 2. There is a rounded masslike opacity associated with the right basilar effusion which is stable to slightly smaller when compared to October 04, 2018. This finding could represent rounded atelectasis or neoplasm. Pneumonia is less likely given stability. Recommend consultation with pulmonary or thoracic surgery as an outpatient for further evaluation. 3. Bilateral pleural effusions.  Mild chronic pulmonary edema. 4. Mild atherosclerotic changes in the nonaneurysmal aorta. 5. Coronary artery calcifications. 6. Radiation changes in the chest. 7. Emphysema. Aortic Atherosclerosis (ICD10-I70.0) and Emphysema (ICD10-J43.9). Electronically Signed   By: Dorise Bullion III M.D   On: 07/30/2019 15:58    Procedures Procedures (including critical care time)  Medications Ordered in ED Medications  albuterol (PROVENTIL) (2.5 MG/3ML) 0.083% nebulizer solution 2.5 mg (has no administration in time range)  methylPREDNISolone sodium succinate (SOLU-MEDROL) 125 mg/2 mL injection  125 mg (125 mg Intravenous Given 07/30/19 1308)  iohexol (OMNIPAQUE) 350 MG/ML injection 75 mL (75 mLs Intravenous Contrast Given 07/30/19 1510)  morphine 4 MG/ML injection 4 mg (4 mg Intravenous Given 07/30/19 1533)  ondansetron (ZOFRAN) injection 4 mg (4 mg Intravenous Given 07/30/19 1533)     Initial Impression / Assessment and Plan / ED Course  I have reviewed  the triage vital signs and the nursing notes.  Pertinent labs & imaging results that were available during my care of the patient were reviewed by me and considered in my medical decision making (see chart for details).        63 year old male who presents for evaluation of difficulty breathing, malaise, chest pain.  Reports he has not felt well over last couple days.  Reported to having some chest pain that began today.  States it was worse with deep inspiration and coughing.  He reports a history of COPD and is on oxygen.  He states he uses it intermittently because he "does not want to become dependent" but wife questions if he needs to be on it continuously.  On initial ED arrival, he was afebrile but was tachycardic and hypoxic with O2 sats down to 88%.  He was placed on 2 L O2 which improved his O2 sats.  On my exam, I do not hear any obvious wheezing.  He does not have any evidence of respiratory distress and is able to talk in full sentences without any difficulty.  Equal radial pulses and easily obtainable left DP pulse but difficulty obtaining palpable right DP pulse.  It was obtainable with Doppler.  Doubt ACS etiology is a sounds atypical.  History/physical exam not concerning for aortic dissection.  Consider COPD exacerbation versus infectious etiology though I do not hear the amount of wheezing that I would expect or an exacerbation.  Given that he was hypoxic, will plan for d-dimer for evaluation of PE.  Additionally, will plan for basic labs, EKG.  CBC shows no leukocytosis.  Hemoglobin stable at 11.3.  CMP is unremarkable.  Troponin is negative.  D-dimer is elevated.  Chest x-ray shows scarring and thickening.  An elevated d-dimer, will plan for CTA of chest for evaluation of PE.  COVID is negative.  Discussed results with patient. He reports feeling better after steroids. O2 sat is stable. Will plan for CTA.   CTA shows no evidence of PE. He has evidence of pleural effusions but this  appears old.  He does have a roudned masslike opacity associated with right basilar effusion, which is stable.   Discussed results with patient.  He reports improvement in symptoms after analgesics here in the ED.  O2 sat is stable.  He states chest pain has improved after analgesics.  Will plan to ambulate with O2 sat and reassess. Plan to treat as COPD exacerbation with short course of prednisone and antibiotics. Patient signed out to [facility], [place].   Keiden Bernasconi Madani was evaluated in Emergency Department on 07/30/2019 for the symptoms described in the history of present illness. He was evaluated in the context of the global COVID-19 pandemic, which necessitated consideration that the patient might be at risk for infection with the SARS-CoV-2 virus that causes COVID-19. Institutional protocols and algorithms that pertain to the evaluation of patients at risk for COVID-19 are in a state of rapid change based on information released by regulatory bodies including the CDC and federal and state organizations. These policies and algorithms were followed during the patient's  care in the ED.  Portions of this note were generated with [employer]. Dictation errors may occur despite best attempts at proofreading.   Final Clinical Impressions(s) / ED Diagnoses   Final diagnoses:  COPD exacerbation [facility])    ED Discharge Orders         Ordered    predniSONE (DELTASONE) 20 MG tablet  Daily     07/30/19 1615    azithromycin (ZITHROMAX) 250 MG tablet  Daily     07/30/19 1615           Desma Mcgregor 07/30/19 1640    Virgel Manifold, MD 07/31/19 [phone]

## 2019-07-30 NOTE — ED Notes (Signed)
While walking pts O2 went from 96 to 89-88% on RA. RN placed o2 2L [place] back on pt and pt is 97%.

## 2019-07-30 NOTE — ED Triage Notes (Signed)
States he didn't feel good yest , states when he breaths or coughs get a pian in his chest, states he has stage 3 COPD , more sob than normal cough, has home 02 however doesn't use it.

## 2019-08-02 ENCOUNTER — Encounter: Payer: Self-pay | Admitting: Internal Medicine

## 2019-08-02 ENCOUNTER — Other Ambulatory Visit: Payer: Self-pay

## 2019-08-02 ENCOUNTER — Ambulatory Visit (INDEPENDENT_AMBULATORY_CARE_PROVIDER_SITE_OTHER): Payer: Medicare Other | Admitting: Internal Medicine

## 2019-08-02 DIAGNOSIS — J9612 Chronic respiratory failure with hypercapnia: Secondary | ICD-10-CM

## 2019-08-02 DIAGNOSIS — J9611 Chronic respiratory failure with hypoxia: Secondary | ICD-10-CM | POA: Diagnosis not present

## 2019-08-02 DIAGNOSIS — R9389 Abnormal findings on diagnostic imaging of other specified body structures: Secondary | ICD-10-CM | POA: Insufficient documentation

## 2019-08-02 DIAGNOSIS — J449 Chronic obstructive pulmonary disease, unspecified: Secondary | ICD-10-CM

## 2019-08-02 NOTE — Patient Instructions (Signed)
Patient has Mychart   Wear 02 if walking beyond "across the room" at 2lpm or whatever it takes to keep you over 90% (like you did for the ER doctors who documented your breathing was much better while walking on 2lpm    1) Albtuerol  is for relief of sob that does not improve by walking a slower pace or resting but rather if the pt does not improve after trying this first. 2)If you are convinced, as many are, that saba helps recover from activity faster then it's easy to tell if this is the case by re-challenging : ie stop, take the inhaler, then p 5 minutes try the exact same activity (intensity of workload) that just caused the symptoms and see if they are substantially diminished or not after saba 3) if there is an activity that consistently causes the same symptoms, try the albuterol 15 min before that same activity on alternate days but also try the nebulizer form of albuterol at least once to see if it makes a difference.  If in fact the Albuerol  really does help, then fine to continue to use it as needed but   may need to look closer at the maintenance regimen being used to achieve better control of airways disease with exertion.   Please schedule a follow up office visit in 2 weeks, sooner if needed  with all medications /inhalers/ solutions in hand so we can verify exactly what you are taking. This includes all medications from all doctors and over the counters

## 2019-08-02 NOTE — Progress Notes (Signed)
Subjective:   Patient ID: Chase Lloyd, male    DOB: 11-Mar-1956,     MRN: [id number]    Brief patient profile:  63 yowm MG phenotype  quit smoking 06/2018  from  [facility]  first aware of doe 2013 dx as copd rx symbicort helped a lot then pna/ sepsis Feb 2017 and added spiriva and rx 02 short term and moved to [place] may 2018 and referred to pulmonary clinic 05/29/2017 by [name] with GOLD III criteria at initial eval    History of Present Illness  05/29/2017 1st [facility] office visit/ [facility]  Maint rx symb 160 2bid / spiriva dpi  Chief Complaint  Patient presents with  . Pulmonary Consult    Pt referred by Dr. [name] for COPD. Pt c/o DOE, prod cough with yellow mucus. Pt denies CP/tightness and f/c/s. Pt states he rarely uses his albuterol hfa.   ok at rest, sleeps fine with improving cough since quit smoking  Left leg swelling x months assoc with hip pain > ortho following with 'neg sonogram"  per pt  rec Plan A = Automatic = Symbicort 160 Take 2 puffs first thing in am and then another 2 puffs about 12 hours later and spiriva 2 puffs in am only (ok to use up the powder form if you want)  Work on inhaler technique:  relax and gently blow all the way out then take a nice smooth deep breath back in, triggering the inhaler at same time you start breathing in.  Hold for up to 5 seconds if you can. Blow out thru nose. Rinse and gargle with water when done Plan B = Backup Only use your albuterol as a rescue medication   07/30/2017  f/u ov/Wert re:  GOLD III with reversibility / symb 160/spiriva smi Chief Complaint  Patient presents with  . Follow-up    PFT's done today. His breathing is doing well. He has not had to use his rescue inhaler. No new co's today.   Not limited by breathing from desired activities but  Due to back and hip and leg issues  Sleeping fine/ no noct saba or am flares rec Work on maintaining perfect inhaler technique:  relax and gently blow all the way  out then take a nice smooth deep breath back in, triggering the inhaler at same time you start breathing in.    04/28/2018 acute extended ov/Wert re:  GOLD III/  Chief Complaint  Patient presents with  . Acute Visit    increased SOB x 2 months. He also c/o occ cough- prod at times with yellow sputum. He states he was just told by PCP today that he had bronchitis based on recent cxr 04/26/18. He is using his albuterol inhaler about every other day.   dyspnea gradually worse x 2 months to point = .MMRC2 = can't walk a nl pace on a flat grade s sob but does fine slow and flat but also limited back and L hip pain 02 none  saba qod  Dysphagia x years on prilosec q am but not ac  Off spiriva x months not convinced made any difference Already on pred/doxy per PCP since 04/26/18  rec Try taking omeprazole Take 30-60 min before first meal of the day  GERD diet   Plan A = Automatic = Symbicort  160 Take 2 puffs first thing in am and then another 2 puffs about 12 hours later.  Plan B = Backup Only use your  albuterol as a rescue medication   Finish the prednisone and antibiotic arleady prescribed  The key is to stop smoking completely before smoking completely stops you!  Please schedule a follow up visit in 3 months but call sooner if needed pft's > not done       09/10/18 NP eval/ aecopd aecopd x one week rec Continue Symbicort 160  Restart Spiriva Respimat 2.5 Continue prednisone Continue doxycycline DuoNeb nebulized medications prescribed today >>> If using more than 1-2 times a day please contact our office    09/21/2018 extended f/u  ov/Wert re:  GOLD III/ group D with refractory sob  Chief Complaint  Patient presents with  . Follow-up    Breathing is worse x 2 days. He is winded just walking from room to room at home. He is not sleeping well. He is having a right temporal HA.  He is coughing with yellow sputum.    Dyspnea:  Room to room, neb helps the most  Cough: thick yellow   Esp in am  Sleeping: poorly due to sob one pillow either side  SABA use: both hfa and neb now but uses grandson's neb / hfa poor  02: no 02  rec Plan A = Automatic = symbicort 160 Take 2 puffs first thing in am and then another 2 puffs about 12 hours later ok to use spirviva in evening = 1.25 (blue top) x 4 or 2.5 (green) x 2  Plan B = Backup Only use your albuterol inhaler as a rescue medication Plan C = Crisis - only use your albuterol nebulizer if you first try Plan B and it fails to help > ok to use the nebulizer up to every 4 hours but if start needing it regularly call for immediate appointment Plan D = Doctor - call me if B and C not adequate Plan E = ER - go to ER or call 911 if all else fails   Prednisone  10 mg Take 4 for three days 3 for three days 2 for three days 1 for three days and stop  Levaquin 500 mg one daily x 7 days - stop if aches in tendons  While coughing / wheezing > prilosec 40 mg Take 30- 60 min before your first and last meals of the day  GERD   Please schedule a follow up office visit in 4 weeks    Admission date:  10/06/2018   Discharge Date:  10/12/2018  Discharge Diagnosis  Hypertensive emergency [I16.1] Dyspnea, unspecified type [R06.00]   Principal Problem:   COPD with acute exacerbation ([place]    Acute respiratory distress   Hypertensive urgency   Troponin I above reference range   Dyspnea          10/22/2018  Post hosp f/u ov/Wert re:  GOLD III / still on 30 mg pred / no longer smoking at all maint on symb/spiriva Chief Complaint  Patient presents with  . Follow-up    Pt has some SOB with exertion, productive cough-yellow, and some wheezing in last week. He rarely uses his albuterol inhaler. He uses his neb 3 x daily.   Dyspnea:  MMRC3 = can't walk 100 yards even at a slow pace at a flat grade s stopping due to sob   Cough:  Better / some am congestion  Sleeping: bed is flat, sleeps on side SABA use: albuterol neb w/in 15 min of rising  daily  02: 2lpm hs and some daytime  rec Plan A =  Automatic = symbicort 160 Take 2 puffs first thing in am chase with Spiriva  and then another 2 puffs about 12 hours later  Plan B = Backup Only use your albuterol inhaler  Plan C = Crisis - only use your albuterol nebulizer Continue to taper off the prednisone  Continue Prilosec 40 mg Take 30- 60 min before your first and last meals of the day  GERD  Diet            05/10/2019  f/u ov/Wert re:  GOLD III/  maint symb  Over cigs since 06/2019  - not using spiriva consistently  Chief Complaint  Patient presents with  . Follow-up    Breathing is about the same. He is using his albuterol inhaler 2 x per wk on average.   Dyspnea:  Can walk a mile with rolling walker stopping a couple times due to sob/ also limited by hip pain   Cough: min throat clearing  Sleeping: bed is flat/ one pillow  SABA use: rarely needed 02: not using much at all  rec Plan A = Automatic = symbicort 160 Take 2 puffs first thing in am and then another 2 puffs about 12 hours later.  Stop spiriva for now (it's like high octane fuel) Plan B = Backup Only use your albuterol inhaler as a rescue medication  Plan C = Crisis - only use your albuterol nebulizer if you first try Plan B and it fails to help > ok to use the nebulizer up to every 4 hours but if start needing it regularly call for immediate appointment   07/19/2019 acute extended ov/Wert re:  GOLD III worse sob x 6 weeks gradually which corresponds roughly to time off spiriva and just on symb 160 2bid  Chief Complaint  Patient presents with  . Acute Visit    Increased SOB x 6 wks. He has also noticed some wheezing. He is using neb every am but unsure name of medication. He is using his albuterol inhaler 1-2 x per day.   indolent onset gradually worse doe now to point = MMRC4  = sob if tries to leave home or while getting dressed   Assoc some wheeze/ phelgm in throat sensation but really no excess or  purulent sputum  Sleeping in a flat bed/ one pillow under head/ no am wakening or flares  02 has it only uses sometimes at hs  Saba: too much saba use noted  rec Omeprazole 40 mg  Take 30- 60 min before your first and last meals of the day  GERD diet   Prednisone 10 mg take  4 each am x 2 days,   2 each am x 2 days,  1 each am x 2 days and stop Plan A = Automatic/Always >>>  symbicort 160 x 2 pffs, chase with 2 puffs of spiriva and then 12 hours just use the symbicort 160 x 2 puffs  Plan B = Backup Only use your albuterol inhaler   Plan C = Crisis - only use your albuterol nebulizer if you first try Plan B   07/30/2019 ER with aecopd with chest tightness rx Pred,zpak  no better    Virtual Visit via Telephone Note 08/02/2019  GOLD III/ 02 dep with hs and prn   I connected with Jaymes Graff Curnutt on 08/02/19 at  1:45 PM EDT by telephone and verified that I am speaking with the correct person using two identifiers.   I discussed the limitations, risks, security  and privacy concerns of performing an evaluation and management service by telephone and the availability of in person appointments. I also discussed with the patient that there may be a patient responsible charge related to this service. The patient expressed understanding and agreed to proceed.   History of Present Illness: Dyspnea:  Room to room  Cough: some sensation of pnds Sleeping: flat/ one pillow  SABA use: only using on return  02: 93-4% at rest on RA Also new L cp with cough x weeks located just above L nipple with no corresponding findings on chest cta 07/30/19 Very poor insight into use of saba "harldly ever uses hfa/ never neb" and 02 "afraid of getting hooked on it and it hurting my lungs"   No obvious day to day or daytime variability or assoc excess/ purulent sputum or mucus plugs or hemoptysis or subjective wheeze or overt sinus or hb symptoms on ppi bid ac.   Also denies any obvious fluctuation of symptoms with  weather or environmental changes or other aggravating or alleviating factors except as outlined above.   Meds reviewed/ med reconciliation completed         Observations/Objective: Speaking in full sentences, no coughing, min coarse phonation     I personally reviewed images and agree with radiology impression as follows:   Chest CTa  07/30/19   1. No pulmonary emboli identified. 2. There is a rounded masslike opacity associated with the right basilar effusion which is stable to slightly smaller when compared to October 04, 2018.   3. Bilateral pleural effusions.  Mild chronic pulmonary edema. 4. Mild atherosclerotic changes in the nonaneurysmal aorta. 5. Coronary artery calcifications. 6. Radiation changes in the chest. 7. Emphysema.   Assessment and Plan: See problem list for active a/p's   Follow Up Instructions: See avs for instructions unique to this ov which includes revised/ updated med list     I discussed the assessment and treatment plan with the patient. The patient was provided an opportunity to ask questions and all were answered. The patient agreed with the plan and demonstrated an understanding of the instructions.   The patient was advised to call back or seek an in-person evaluation if the symptoms worsen or if the condition fails to improve as anticipated.  I provided 30 minutes of non-face-to-face time during this encounter.   Christinia Gully, MD

## 2019-08-02 NOTE — Assessment & Plan Note (Addendum)
Quit smoking 06/2018  -  Spirometry 05/29/2017  FEV1 1.37 (39%)  Ratio 47 p am symb x2 and spirva x one and abn effort dep portion  - 05/29/2017   > rec  change  spiriva to  respimat and symb 160 2bid s spacer   - PFT's  07/30/2017  FEV1 1.39 (38 % ) ratio 48  p 23 % improvement from saba p nothing prior to study with DLCO  39/42 % corrects to 59 % for alv volume  - PFT's  10/22/2018  FEV1 1.26 (35 % ) ratio 44  p 14 % improvement from saba p symb x2  prior to study with DLCO  37/43c % corrects to 67 % for alv volume  - alpha one AT screen 12/03/2018   GM  Level 155  - 12/15/2018 notified that his insurance doesn't cover rehab  07/19/2019  After extensive coaching inhaler device,  effectiveness =    90% with SMI > restart spiriva   Group D in terms of symptom/risk and laba/lama/ICS  therefore appropriate rx at this point >>>  No change symb/ spiriva > good candidate for Breztri w/a.  Advised: I spent extra time with pt today reviewing appropriate use of albuterol for prn use on exertion with the following points: 1) saba is for relief of sob that does not improve by walking a slower pace or resting but rather if the pt does not improve after trying this first. 2) If the pt is convinced, as many are, that saba helps recover from activity faster then it's easy to tell if this is the case by re-challenging : ie stop, take the inhaler, then p 5 minutes try the exact same activity (intensity of workload) that just caused the symptoms and see if they are substantially diminished or not after saba 3) if there is an activity that reproducibly causes the symptoms, try the saba 15 min before the activity on alternate days   If in fact the saba really does help, then fine to continue to use it prn but advised may need to look closer at the maintenance regimen being used to achieve better control of airways disease with exertion.   >>> f/u in 2 weeks with all meds    Each maintenance medication was reviewed in  detail including most importantly the difference between maintenance and as needed and under what circumstances the prns are to be used.  Please see AVS for specific  Instructions which are unique to this visit and I personally typed out  which were reviewed in detail  Over the phone  with the patient and a copy provided via MyChart

## 2019-08-02 NOTE — Assessment & Plan Note (Signed)
HC03     07/19/2019  = 33  - ER eval 07/30/19 Patient ambulated on 2 L of oxygen with oxygen saturations above 99%.  He denies any shortness of breath.  As of 08/02/2019 rec 2lpm hs and prn with activity beyond "across the room" with goal of keeping sats > 90% at all times

## 2019-08-02 NOTE — Assessment & Plan Note (Signed)
See CT 07/30/19  RLL rounded density assoc with improving effusion on R ? Rounded atx   Moot finding for now given severity of lung dz and symptoms not attributable to this lesion > continue to monitor for recurrent effusion/ worse atx .  Marland KitchenDiscussed in detail all the  indications, usual  risks and alternatives  relative to the benefits with patient who agrees to proceed with conservative f/u as outlined

## 2019-08-05 ENCOUNTER — Telehealth: Payer: Self-pay | Admitting: *Deleted

## 2019-08-05 NOTE — Telephone Encounter (Signed)
-----   Message from Tanda Rockers, MD sent at 08/02/2019  1:31 PM EDT ----- Ov 2 weeks with all meds in hand

## 2019-08-05 NOTE — Telephone Encounter (Signed)
Pt already scheduled for appt and was advised to bring all meds

## 2019-08-19 ENCOUNTER — Other Ambulatory Visit: Payer: Self-pay

## 2019-08-19 ENCOUNTER — Encounter: Payer: Self-pay | Admitting: Internal Medicine

## 2019-08-19 ENCOUNTER — Ambulatory Visit (INDEPENDENT_AMBULATORY_CARE_PROVIDER_SITE_OTHER): Payer: Medicare Other | Admitting: Internal Medicine

## 2019-08-19 VITALS — BP 136/60 | HR 87 | Temp 97.7°F | Ht 71.0 in | Wt 199.0 lb

## 2019-08-19 DIAGNOSIS — I2584 Coronary atherosclerosis due to calcified coronary lesion: Secondary | ICD-10-CM | POA: Diagnosis not present

## 2019-08-19 DIAGNOSIS — J9611 Chronic respiratory failure with hypoxia: Secondary | ICD-10-CM | POA: Diagnosis not present

## 2019-08-19 DIAGNOSIS — I2510 Atherosclerotic heart disease of native coronary artery without angina pectoris: Secondary | ICD-10-CM

## 2019-08-19 DIAGNOSIS — J9612 Chronic respiratory failure with hypercapnia: Secondary | ICD-10-CM

## 2019-08-19 DIAGNOSIS — R0600 Dyspnea, unspecified: Secondary | ICD-10-CM | POA: Diagnosis not present

## 2019-08-19 DIAGNOSIS — J449 Chronic obstructive pulmonary disease, unspecified: Secondary | ICD-10-CM

## 2019-08-19 DIAGNOSIS — R0609 Other forms of dyspnea: Secondary | ICD-10-CM

## 2019-08-19 MED ORDER — TRELEGY ELLIPTA 100-62.5-25 MCG/INH IN AEPB: 1.0000 | INHALATION_SPRAY | Freq: Every day | RESPIRATORY_TRACT | 11 refills | Status: DC

## 2019-08-19 MED ORDER — TRELEGY ELLIPTA 100-62.5-25 MCG/INH IN AEPB: 1.0000 | INHALATION_SPRAY | Freq: Every day | RESPIRATORY_TRACT | 0 refills | Status: DC

## 2019-08-19 MED ORDER — AZITHROMYCIN 250 MG PO TABS: ORAL_TABLET | ORAL | 0 refills | Status: DC

## 2019-08-19 NOTE — Patient Instructions (Addendum)
Omeprazole should be Take 30- 60 min before your first and last meals of the day   zpak should turned the mucus clear   Maintain your 02 above 90% at all times   Plan A = Automatic = Always=   Trelegy one click each am - take two good drags   Plan B = Backup (to supplement plan A, not to replace it) Only use your albuterol inhaler as a rescue medication to be used if you can't catch your breath by resting or doing a relaxed purse lip breathing pattern.  - The less you use it, the better it will work when you need it. - Ok to use the inhaler up to 2 puffs  every 4 hours if you must but call for appointment if use goes up over your usual need - Don't leave home without it !!  (think of it like the spare tire for your car)   Plan C = Crisis (instead of Plan B but only if Plan B stops working) - only use your albuterol nebulizer if you first try Plan B and it fails to help > ok to use the nebulizer up to every 4 hours but if start needing it regularly call for immediate appointment  We will check on home rehab   Please schedule a follow up office visit in 2 weeks, sooner if needed

## 2019-08-19 NOTE — Progress Notes (Signed)
Subjective:   Patient ID: Chase Lloyd, male    DOB: 19-Nov-1955,     MRN: [id number]    Brief patient profile:  65 yowm MG phenotype  quit smoking 06/2018  from  Chase Lloyd  first aware of doe 2013 dx as copd rx symbicort helped a lot then pna/ sepsis Feb 2017 and added spiriva and rx 02 short term and moved to [place] may 2018 and referred to pulmonary clinic 05/29/2017 by Chase Lloyd with GOLD III criteria at initial eval    History of Present Illness  05/29/2017 1st [name] office visit/ Chase Lloyd  Maint rx symb 160 2bid / spiriva dpi  Chief Complaint  Patient presents with   Pulmonary Consult    Pt referred by Dr. [name] for COPD. Pt c/o DOE, prod cough with yellow mucus. Pt denies CP/tightness and f/c/s. Pt states he rarely uses his albuterol hfa.   ok at rest, sleeps fine with improving cough since quit smoking  Left leg swelling x months assoc with hip pain > ortho following with 'neg sonogram"  per pt  rec Plan A = Automatic = Symbicort 160 Take 2 puffs first thing in am and then another 2 puffs about 12 hours later and spiriva 2 puffs in am only (ok to use up the powder form if you want)  Work on inhaler technique:  relax and gently blow all the way out then take a nice smooth deep breath back in, triggering the inhaler at same time you start breathing in.  Hold for up to 5 seconds if you can. Blow out thru nose. Rinse and gargle with water when done Plan B = Backup Only use your albuterol as a rescue medication   07/30/2017  f/u ov/[name] re:  GOLD III with reversibility / symb 160/spiriva smi Chief Complaint  Patient presents with   Follow-up    PFT's done today. His breathing is doing well. He has not had to use his rescue inhaler. No new co's today.   Not limited by breathing from desired activities but  Due to back and hip and leg issues  Sleeping fine/ no noct saba or am flares rec Work on maintaining perfect inhaler technique:  relax and gently blow all the way  out then take a nice smooth deep breath back in, triggering the inhaler at same time you start breathing in.    04/28/2018 acute extended ov/Chase Lloyd re:  GOLD III/  Chief Complaint  Patient presents with   Acute Visit    increased SOB x 2 months. He also c/o occ cough- prod at times with yellow sputum. He states he was just told by PCP today that he had bronchitis based on recent cxr 04/26/18. He is using his albuterol inhaler about every other day.   dyspnea gradually worse x 2 months to point = .MMRC2 = can't walk a nl pace on a flat grade s sob but does fine slow and flat but also limited back and L hip pain 02 none  saba qod  Dysphagia x years on prilosec q am but not ac  Off spiriva x months not convinced made any difference Already on pred/doxy per PCP since 04/26/18  rec Try taking omeprazole Take 30-60 min before first meal of the day  GERD diet   Plan A = Automatic = Symbicort  160 Take 2 puffs first thing in am and then another 2 puffs about 12 hours later.  Plan B = Backup Only use your  albuterol as a rescue medication   Finish the prednisone and antibiotic arleady prescribed  The key is to stop smoking completely before smoking completely stops you!  Please schedule a follow up visit in 3 months but call sooner if needed pft's > not done       09/10/18 NP eval/ aecopd aecopd x one week rec Continue Symbicort 160  Restart Spiriva Respimat 2.5 Continue prednisone Continue doxycycline DuoNeb nebulized medications prescribed today >>> If using more than 1-2 times a day please contact our office    09/21/2018 extended f/u  ov/Chase Lloyd re:  GOLD III/ group D with refractory sob  Chief Complaint  Patient presents with   Follow-up    Breathing is worse x 2 days. He is winded just walking from room to room at home. He is not sleeping well. He is having a right temporal HA.  He is coughing with yellow sputum.    Dyspnea:  Room to room, neb helps the most  Cough: thick yellow   Esp in am  Sleeping: poorly due to sob one pillow either side  SABA use: both hfa and neb now but uses grandson's neb / hfa poor  02: no 02  rec Plan A = Automatic = symbicort 160 Take 2 puffs first thing in am and then another 2 puffs about 12 hours later ok to use spirviva in evening = 1.25 (blue top) x 4 or 2.5 (green) x 2  Plan B = Backup Only use your albuterol inhaler as a rescue medication Plan C = Crisis - only use your albuterol nebulizer if you first try Plan B and it fails to help > ok to use the nebulizer up to every 4 hours but if start needing it regularly call for immediate appointment Plan D = Doctor - call me if B and C not adequate Plan E = ER - go to ER or call 911 if all else fails   Prednisone  10 mg Take 4 for three days 3 for three days 2 for three days 1 for three days and stop  Levaquin 500 mg one daily x 7 days - stop if aches in tendons  While coughing / wheezing > prilosec 40 mg Take 30- 60 min before your first and last meals of the day  GERD   Please schedule a follow up office visit in 4 weeks    Admission date:  10/06/2018   Discharge Date:  10/12/2018  Discharge Diagnosis  Hypertensive emergency [I16.1] Dyspnea, unspecified type [R06.00]   Principal Problem:   COPD with acute exacerbation ([place]    Acute respiratory distress   Hypertensive urgency   Troponin I above reference range   Dyspnea          10/22/2018  Post hosp f/u ov/Chase Lloyd re:  GOLD III / still on 30 mg pred / no longer smoking at all maint on symb/spiriva Chief Complaint  Patient presents with   Follow-up    Pt has some SOB with exertion, productive cough-yellow, and some wheezing in last week. He rarely uses his albuterol inhaler. He uses his neb 3 x daily.   Dyspnea:  MMRC3 = can't walk 100 yards even at a slow pace at a flat grade s stopping due to sob   Cough:  Better / some am congestion  Sleeping: bed is flat, sleeps on side SABA use: albuterol neb w/in 15 min of rising  daily  02: 2lpm hs and some daytime  rec Plan A =  Automatic = symbicort 160 Take 2 puffs first thing in am chase with Spiriva  and then another 2 puffs about 12 hours later  Plan B = Backup Only use your albuterol inhaler  Plan C = Crisis - only use your albuterol nebulizer Continue to taper off the prednisone  Continue Prilosec 40 mg Take 30- 60 min before your first and last meals of the day  GERD  Diet         12/03/2018  f/u ov/Jamarrion Budai re:   GOLD III/ maint sym/spiriva/ off prednisone/  Chief Complaint  Patient presents with   Follow-up    Breathing has improved some. He uses his albuterol inhaler about once per wk.   Dyspnea: hip stopping first  Cough: some nasal/ thraot congestion Sleeping: bed is flat  SABA use: rarely 02: 2lpm hs  rec No change rx     05/10/2019  f/u ov/Tyrell Brereton re:  GOLD III/  maint symb  Over cigs since 06/2019  - not using spiriva consistently  Chief Complaint  Patient presents with   Follow-up    Breathing is about the same. He is using his albuterol inhaler 2 x per wk on average.   Dyspnea:  Can walk a mile with rolling walker stopping a couple times due to sob/ also limited by hip pain   Cough: min throat clearing  Sleeping: bed is flat/ one pillow  SABA use: rarely needed 02: not using much at all  rec Plan A = Automatic = symbicort 160 Take 2 puffs first thing in am and then another 2 puffs about 12 hours later.  Stop spiriva for now (it's like high octane fuel) Plan B = Backup Only use your albuterol inhaler as a rescue medication  Plan C = Crisis - only use your albuterol nebulizer if you first try Plan B and it fails to help > ok to use the nebulizer up to every 4 hours but if start needing it regularly call for immediate appointment   07/19/2019 acute extended ov/Male Minish re:  GOLD III worse sob x 6 weeks gradually which corresponds roughly to time off spiriva and just on symb 160 2bid  Chief Complaint  Patient presents with   Acute Visit      Increased SOB x 6 wks. He has also noticed some wheezing. He is using neb every am but unsure name of medication. He is using his albuterol inhaler 1-2 x per day.   indolent onset gradually worse doe now to point = MMRC4  = sob if tries to leave home or while getting dressed   Assoc some wheeze/ phelgm in throat sensation but really no excess or purulent sputum  Sleeping in a flat bed/ one pillow under head/ no am wakening or flares  02 has it only uses sometimes at hs  Saba: too much saba use noted  rec Omeprazole 40 mg  Take 30- 60 min before your first and last meals of the day  GERD diet   Prednisone 10 mg take  4 each am x 2 days,   2 each am x 2 days,  1 each am x 2 days and stop Plan A = Automatic/Always >>>  symbicort 160 x 2 pffs, chase with 2 puffs of spiriva and then 12 hours just use the symbicort 160 x 2 puffs  Plan B = Backup Only use your albuterol inhaler as a rescue medication  Plan C = Crisis - only use your albuterol nebulizer if you first try  Plan B and it fails to help > ok to use the nebulizer up to every 4 hours but if start needing it regularly call for immediate appointment Please remember to go to the lab and x-ray department   for your tests - we will call you with the results when they are available.        Phone visit  08/02/2019 Wear 02 if walking beyond "across the room" at 2lpm or whatever it takes to keep you over 90% (like you did for the ER doctors who documented your breathing was much better while walking on 2lpm 1) Albtuerol  is for relief of sob that does not improve by walking a slower pace or resting but rather if the pt does not improve after trying this first. 2)If you are convinced, as many are, that saba helps recover from activity faster then it's easy to tell if this is the case by re-challenging : ie stop, take the inhaler, then p 5 minutes try the exact same activity (intensity of workload) that just caused the symptoms and see if they are  substantially diminished or not after saba 3) if there is an activity that consistently causes the same symptoms, try the albuterol 15 min before that same activity on alternate days but also try the nebulizer form of albuterol at least once to see if it makes a difference. If in fact the Albuerol  really does help, then fine to continue to use it as needed but   may need to look closer at the maintenance regimen being used to achieve better control of airways disease with exertion.   08/19/2019  f/u ov/Yuniel Blaney re: GOLD III  Chief Complaint  Patient presents with   Follow-up    Breathing is unchanged since the last visit. He is using the albuterol inhaler 3-4 x per wk and neb about 2 x per wk.   Dyspnea:  Uses walker / room to room on 2lpm sats usually ok but still give out  Cough: mucus turned yellow esp in am  Sleeping: flat/no pillows  SABA use: neb first thing  02: 2lpm hs    No obvious day to day or daytime variability or assoc   mucus plugs or hemoptysis or cp or chest tightness, subjective wheeze or overt sinus or hb symptoms.   sleeping without nocturnal    exacerbation  of respiratory  c/o's or need for noct saba. Also denies any obvious fluctuation of symptoms with weather or environmental changes or other aggravating or alleviating factors except as outlined above   No unusual exposure hx or h/o childhood pna/ asthma or knowledge of premature birth.  Current Allergies, Complete Past Medical History, Past Surgical History, Family History, and Social History were reviewed in [employer] record.  ROS  The following are not active complaints unless bolded Hoarseness, sore throat, dysphagia, dental problems, itching, sneezing,  nasal congestion or discharge of excess mucus or purulent secretions, ear ache,   fever, chills, sweats, unintended wt loss or wt gain, classically pleuritic or exertional cp,  orthopnea pnd or arm/hand swelling  or leg swelling,  presyncope, palpitations, abdominal pain, anorexia, nausea, vomiting, diarrhea  or change in bowel habits or change in bladder habits, change in stools or change in urine, dysuria, hematuria,  rash, arthralgias, visual complaints, headache, numbness, weakness or ataxia or problems with walking or coordination,  change in mood or  memory.        Current Meds  Medication Sig  albuterol (PROVENTIL) (2.5 MG/3ML) 0.083% nebulizer solution Take 3 mLs (2.5 mg total) by nebulization every 4 (four) hours as needed for wheezing or shortness of breath.   Albuterol Sulfate 108 (90 Base) MCG/ACT AEPB Inhale 1-2 puffs into the lungs every 6 (six) hours as needed (wheezing/shortness of breath).   aspirin EC 81 MG tablet Take 81 mg by mouth daily.   furosemide (LASIX) 20 MG tablet Take 1 tablet (20 mg total) by mouth daily.   ibuprofen (ADVIL) 800 MG tablet TAKE 1 TABLET BY MOUTH EVERY 8 HOURS AS NEEDED   isosorbide mononitrate (IMDUR) 30 MG 24 hr tablet Take 1 tablet (30 mg total) by mouth daily.   metoprolol succinate (TOPROL-XL) 25 MG 24 hr tablet Take 1 tablet (25 mg total) by mouth daily.   omeprazole (PRILOSEC) 40 MG capsule Take 1 capsule (40 mg total) by mouth 2 (two) times daily.   OXYGEN 2LPM AS NEEDED PER PT   rosuvastatin (CRESTOR) 10 MG tablet Take 1 tablet (10 mg total) by mouth daily.   SYMBICORT 160-4.5 MCG/ACT inhaler Inhale 2 puffs into the lungs 2 (two) times daily.   tamsulosin (FLOMAX) 0.4 MG CAPS capsule Take 1 capsule (0.4 mg total) by mouth daily.   Tiotropium Bromide Monohydrate (SPIRIVA RESPIMAT) 2.5 MCG/ACT AERS Inhale 2 puffs into the lungs daily.                    Objective:   Physical Exam  W/c bound wm nad    08/19/2019        199  07/19/2019          196  05/10/2019            192  12/03/2018          189 10/22/2018      184 09/21/2018      179  04/28/2018        173  07/30/2017        173  05/29/17 168 lb (76.2 kg)  05/01/17 164 lb (74.4 kg)    04/15/17 162 lb (73.5 kg)          reports: full dentures   HEENT : pt wearing mask not removed for exam due to covid -19 concerns.    NECK :  without JVD/Nodes/TM/ nl carotid upstrokes bilaterally   LUNGS: no acc muscle use,  Mod barrel  contour chest wall with bilateral  Distant bs s audible wheeze and  without cough on insp or exp maneuvers and mod  Hyperresonant  to  percussion bilaterally     CV:  RRR  no s3 or murmur or increase in P2, and no edema   ABD:  soft and nontender with pos mid insp Hoover's  in the supine position. No bruits or organomegaly appreciated, bowel sounds nl  MS:     ext warm without deformities, calf tenderness, cyanosis or clubbing No obvious joint restrictions   SKIN: warm and dry without lesions    NEURO:  alert, approp, nl sensorium with  no motor or cerebellar deficits apparent.          I personally reviewed images and agree with radiology impression as follows:   Chest CTa 07/30/19 1. No pulmonary emboli identified. 2. There is a rounded masslike opacity associated with the right basilar effusion which is stable to slightly smaller when compared to October 04, 2018. This finding could represent rounded atelectasis or neoplasm. Pneumonia is less likely given stability. Recommend consultation  with pulmonary or thoracic surgery as an outpatient for further evaluation. 3. Bilateral pleural effusions.  Mild chronic pulmonary edema. 4. Mild atherosclerotic changes in the nonaneurysmal aorta. 5. Coronary artery calcifications. 6. Radiation changes in the chest. 7. Emphysema.            Assessment & Plan:

## 2019-08-21 ENCOUNTER — Encounter: Payer: Self-pay | Admitting: Internal Medicine

## 2019-08-21 NOTE — Assessment & Plan Note (Signed)
Echo 10/07/18 Left ventricle: The cavity size was normal. There was mild   concentric hypertrophy. Systolic function was normal. The   estimated ejection fraction was in the range of 60% to 65%. Wall   motion was normal; there were no regional wall motion   abnormalities. Doppler parameters are consistent with abnormal   left ventricular relaxation (grade 1 diastolic dysfunction).   There was no evidence of elevated ventricular filling pressure by   Doppler parameters. - Aortic valve: Trileaflet; mildly thickened leaflets. There was   moderate regurgitation. - Mitral valve: There was mild regurgitation. - Left atrium: The atrium was normal in size. - Right ventricle: Systolic function was normal. - Tricuspid valve: There was mild regurgitation. - Pulmonic valve: There was no regurgitation. - Pulmonary arteries: Systolic pressure was within the normal   range. - Inferior vena cava: The vessel was normal in size. - Pericardium, extracardiac: There was no pericardial effusion. LHC  04/29/19  LVEDP 22   Note bilateral effusions / tendency to peripheral edema so likely has element of chf / cardiac asthma > rec extra lasix prn leg swelling and f/u with Dr Claiborne Billings as planned.

## 2019-08-21 NOTE — Assessment & Plan Note (Signed)
HC03     07/19/2019  = 33  - ER eval 07/30/19 Patient ambulated on 2 L of oxygen with oxygen saturations above 99%.  He denies any shortness of breath.  As of 08/19/2019 rec 2lpm hs and prn with activity beyond "across the room" with goal of keeping sats > 90% at all times   Adequate control on present rx, reviewed in detail with pt > no change in rx needed     .I had an extended discussion with the patient reviewing all relevant studies completed to date and  lasting 15 to 20 minutes of a 25 minute visit    I performed detailed device teaching using a teach back method which extended face to face time for this visit (see above)  Each maintenance medication was reviewed in detail including emphasizing most importantly the difference between maintenance and prns and under what circumstances the prns are to be triggered using an action plan format that is not reflected in the computer generated alphabetically organized AVS which I have not found useful in most complex patients, especially with respiratory illnesses  Please see AVS for specific instructions unique to this visit that I personally wrote and verbalized to the the pt in detail and then reviewed with pt  by my nurse highlighting any  changes in therapy recommended at today's visit to their plan of care.

## 2019-08-21 NOTE — Assessment & Plan Note (Addendum)
Quit smoking 06/2018  -  Spirometry 05/29/2017  FEV1 1.37 (39%)  Ratio 47 p am symb x2 and spirva x one and abn effort dep portion  - 05/29/2017   > rec  change  spiriva to  respimat and symb 160 2bid s spacer   - PFT's  07/30/2017  FEV1 1.39 (38 % ) ratio 48  p 23 % improvement from saba p nothing prior to study with DLCO  39/42 % corrects to 59 % for alv volume  - PFT's  10/22/2018  FEV1 1.26 (35 % ) ratio 44  p 14 % improvement from saba p symb x2  prior to study with DLCO  37/43c % corrects to 67 % for alv volume  - alpha one AT screen 12/03/2018   GM  Level 155  - 12/15/2018 notified that his insurance doesn't cover rehab  07/19/2019   restart spiriva  - 08/19/2019 changed to trelegy due to no better on spiriva/symb - 08/19/2019 try again for home pulmonary rehab    Group D in terms of symptom/risk and laba/lama/ICS  therefore appropriate rx at this point >>>  Try trelegy x 2 weeks then return   - The proper method of use, as well as anticipated side effects, of a elpita/dpi inhaler were discussed and demonstrated to the patient. Improved effectiveness after extensive coaching during this visit to a level of approximately 90 % from a baseline of 90 %

## 2019-08-24 ENCOUNTER — Telehealth: Payer: Self-pay | Admitting: Internal Medicine

## 2019-08-24 DIAGNOSIS — J90 Pleural effusion, not elsewhere classified: Secondary | ICD-10-CM | POA: Diagnosis not present

## 2019-08-24 DIAGNOSIS — Z7951 Long term (current) use of inhaled steroids: Secondary | ICD-10-CM | POA: Diagnosis not present

## 2019-08-24 DIAGNOSIS — J449 Chronic obstructive pulmonary disease, unspecified: Secondary | ICD-10-CM | POA: Diagnosis not present

## 2019-08-24 DIAGNOSIS — I083 Combined rheumatic disorders of mitral, aortic and tricuspid valves: Secondary | ICD-10-CM | POA: Diagnosis not present

## 2019-08-24 DIAGNOSIS — J9611 Chronic respiratory failure with hypoxia: Secondary | ICD-10-CM | POA: Diagnosis not present

## 2019-08-24 DIAGNOSIS — Z791 Long term (current) use of non-steroidal anti-inflammatories (NSAID): Secondary | ICD-10-CM | POA: Diagnosis not present

## 2019-08-24 DIAGNOSIS — I10 Essential (primary) hypertension: Secondary | ICD-10-CM | POA: Diagnosis not present

## 2019-08-24 DIAGNOSIS — J811 Chronic pulmonary edema: Secondary | ICD-10-CM | POA: Diagnosis not present

## 2019-08-24 DIAGNOSIS — M1611 Unilateral primary osteoarthritis, right hip: Secondary | ICD-10-CM | POA: Diagnosis not present

## 2019-08-24 DIAGNOSIS — J9612 Chronic respiratory failure with hypercapnia: Secondary | ICD-10-CM | POA: Diagnosis not present

## 2019-08-24 DIAGNOSIS — R1310 Dysphagia, unspecified: Secondary | ICD-10-CM | POA: Diagnosis not present

## 2019-08-24 DIAGNOSIS — I700 Atherosclerosis of aorta: Secondary | ICD-10-CM | POA: Diagnosis not present

## 2019-08-24 DIAGNOSIS — Z8701 Personal history of pneumonia (recurrent): Secondary | ICD-10-CM | POA: Diagnosis not present

## 2019-08-24 DIAGNOSIS — Z7982 Long term (current) use of aspirin: Secondary | ICD-10-CM | POA: Diagnosis not present

## 2019-08-24 DIAGNOSIS — I2584 Coronary atherosclerosis due to calcified coronary lesion: Secondary | ICD-10-CM | POA: Diagnosis not present

## 2019-08-24 DIAGNOSIS — Z87891 Personal history of nicotine dependence: Secondary | ICD-10-CM | POA: Diagnosis not present

## 2019-08-24 NOTE — Telephone Encounter (Signed)
Spoke with Amy and gave VO for PT and OT as outlined  Nothing further needed

## 2019-08-26 DIAGNOSIS — J90 Pleural effusion, not elsewhere classified: Secondary | ICD-10-CM | POA: Diagnosis not present

## 2019-08-26 DIAGNOSIS — J9612 Chronic respiratory failure with hypercapnia: Secondary | ICD-10-CM | POA: Diagnosis not present

## 2019-08-26 DIAGNOSIS — M1611 Unilateral primary osteoarthritis, right hip: Secondary | ICD-10-CM | POA: Diagnosis not present

## 2019-08-26 DIAGNOSIS — J9611 Chronic respiratory failure with hypoxia: Secondary | ICD-10-CM | POA: Diagnosis not present

## 2019-08-26 DIAGNOSIS — J449 Chronic obstructive pulmonary disease, unspecified: Secondary | ICD-10-CM | POA: Diagnosis not present

## 2019-08-26 DIAGNOSIS — J811 Chronic pulmonary edema: Secondary | ICD-10-CM | POA: Diagnosis not present

## 2019-08-30 ENCOUNTER — Other Ambulatory Visit: Payer: Self-pay | Admitting: Adult Health

## 2019-08-30 DIAGNOSIS — N400 Enlarged prostate without lower urinary tract symptoms: Secondary | ICD-10-CM

## 2019-08-31 DIAGNOSIS — M1611 Unilateral primary osteoarthritis, right hip: Secondary | ICD-10-CM | POA: Diagnosis not present

## 2019-08-31 DIAGNOSIS — J90 Pleural effusion, not elsewhere classified: Secondary | ICD-10-CM | POA: Diagnosis not present

## 2019-08-31 DIAGNOSIS — J449 Chronic obstructive pulmonary disease, unspecified: Secondary | ICD-10-CM | POA: Diagnosis not present

## 2019-08-31 DIAGNOSIS — J9612 Chronic respiratory failure with hypercapnia: Secondary | ICD-10-CM | POA: Diagnosis not present

## 2019-08-31 DIAGNOSIS — J811 Chronic pulmonary edema: Secondary | ICD-10-CM | POA: Diagnosis not present

## 2019-08-31 DIAGNOSIS — J9611 Chronic respiratory failure with hypoxia: Secondary | ICD-10-CM | POA: Diagnosis not present

## 2019-09-01 ENCOUNTER — Other Ambulatory Visit: Payer: Self-pay | Admitting: Adult Health

## 2019-09-01 DIAGNOSIS — N400 Enlarged prostate without lower urinary tract symptoms: Secondary | ICD-10-CM

## 2019-09-01 NOTE — Telephone Encounter (Signed)
Pt scheduled for 09/06/2019.  He is out of medication.  Ok to refill?

## 2019-09-01 NOTE — Telephone Encounter (Signed)
Ok to refill for 90 days  

## 2019-09-01 NOTE — Telephone Encounter (Signed)
Sent to the pharmacy by e-scribe. 

## 2019-09-01 NOTE — Telephone Encounter (Signed)
Yes please

## 2019-09-02 ENCOUNTER — Ambulatory Visit (INDEPENDENT_AMBULATORY_CARE_PROVIDER_SITE_OTHER): Payer: Medicare Other | Admitting: Internal Medicine

## 2019-09-02 ENCOUNTER — Encounter: Payer: Self-pay | Admitting: Internal Medicine

## 2019-09-02 ENCOUNTER — Telehealth: Payer: Self-pay | Admitting: Internal Medicine

## 2019-09-02 ENCOUNTER — Other Ambulatory Visit: Payer: Self-pay

## 2019-09-02 DIAGNOSIS — J9611 Chronic respiratory failure with hypoxia: Secondary | ICD-10-CM | POA: Diagnosis not present

## 2019-09-02 DIAGNOSIS — I2584 Coronary atherosclerosis due to calcified coronary lesion: Secondary | ICD-10-CM

## 2019-09-02 DIAGNOSIS — J9612 Chronic respiratory failure with hypercapnia: Secondary | ICD-10-CM | POA: Diagnosis not present

## 2019-09-02 DIAGNOSIS — J811 Chronic pulmonary edema: Secondary | ICD-10-CM | POA: Diagnosis not present

## 2019-09-02 DIAGNOSIS — J449 Chronic obstructive pulmonary disease, unspecified: Secondary | ICD-10-CM

## 2019-09-02 DIAGNOSIS — M1611 Unilateral primary osteoarthritis, right hip: Secondary | ICD-10-CM | POA: Diagnosis not present

## 2019-09-02 DIAGNOSIS — R9389 Abnormal findings on diagnostic imaging of other specified body structures: Secondary | ICD-10-CM | POA: Diagnosis not present

## 2019-09-02 DIAGNOSIS — I2510 Atherosclerotic heart disease of native coronary artery without angina pectoris: Secondary | ICD-10-CM

## 2019-09-02 DIAGNOSIS — Z23 Encounter for immunization: Secondary | ICD-10-CM

## 2019-09-02 DIAGNOSIS — J90 Pleural effusion, not elsewhere classified: Secondary | ICD-10-CM | POA: Diagnosis not present

## 2019-09-02 MED ORDER — TRELEGY ELLIPTA 100-62.5-25 MCG/INH IN AEPB: 1.0000 | INHALATION_SPRAY | Freq: Every day | RESPIRATORY_TRACT | 11 refills | Status: DC

## 2019-09-02 MED ORDER — TAMSULOSIN HCL 0.4 MG PO CAPS: 0.4000 mg | ORAL_CAPSULE | Freq: Every day | ORAL | 0 refills | Status: DC

## 2019-09-02 NOTE — Progress Notes (Signed)
Subjective:   Patient ID: Chase Lloyd, male    DOB: [birth year],     MRN: [id number]    Brief patient profile:  37 yowm MG phenotype  quit smoking 06/2018  from  [facility]  first aware of doe 2013 dx as copd rx symbicort helped a lot then pna/ sepsis Feb 2017 and added spiriva and rx 02 short term and moved to [place] may 2018 and referred to pulmonary clinic 05/29/2017 by [name] with GOLD III criteria at initial eval    History of Present Illness  05/29/2017 1st Chase Lloyd Pulmonary office visit/ Zoee Heeney  Maint rx symb 160 2bid / spiriva dpi  Chief Complaint  Patient presents with  . Pulmonary Consult    Pt referred by Dr. [name] for COPD. Pt c/o DOE, prod cough with yellow mucus. Pt denies CP/tightness and f/c/s. Pt states he rarely uses his albuterol hfa.   ok at rest, sleeps fine with improving cough since quit smoking  Left leg swelling x months assoc with hip pain > ortho following with 'neg sonogram"  per pt  rec Plan A = Automatic = Symbicort 160 Take 2 puffs first thing in am and then another 2 puffs about 12 hours later and spiriva 2 puffs in am only (ok to use up the powder form if you want)  Work on inhaler technique:  relax and gently blow all the way out then take a nice smooth deep breath back in, triggering the inhaler at same time you start breathing in.  Hold for up to 5 seconds if you can. Blow out thru nose. Rinse and gargle with water when done Plan B = Backup Only use your albuterol as a rescue medication   07/30/2017  f/u ov/Chase Lloyd re:  GOLD III with reversibility / symb 160/spiriva smi Chief Complaint  Patient presents with  . Follow-up    PFT's done today. His breathing is doing well. He has not had to use his rescue inhaler. No new co's today.   Not limited by breathing from desired activities but  Due to back and hip and leg issues  Sleeping fine/ no noct saba or am flares rec Work on maintaining perfect inhaler technique:  relax and gently blow all the way  out then take a nice smooth deep breath back in, triggering the inhaler at same time you start breathing in.    04/28/2018 acute extended ov/Chase Lloyd re:  GOLD III/  Chief Complaint  Patient presents with  . Acute Visit    increased SOB x 2 months. He also c/o occ cough- prod at times with yellow sputum. He states he was just told by PCP today that he had bronchitis based on recent cxr 04/26/18. He is using his albuterol inhaler about every other day.   dyspnea gradually worse x 2 months to point = .MMRC2 = can't walk a nl pace on a flat grade s sob but does fine slow and flat but also limited back and L hip pain 02 none  saba qod  Dysphagia x years on prilosec q am but not ac  Off spiriva x months not convinced made any difference Already on pred/doxy per PCP since 04/26/18  rec Try taking omeprazole Take 30-60 min before first meal of the day  GERD diet   Plan A = Automatic = Symbicort  160 Take 2 puffs first thing in am and then another 2 puffs about 12 hours later.  Plan B = Backup Only use your  albuterol as a rescue medication   Finish the prednisone and antibiotic arleady prescribed  The key is to stop smoking completely before smoking completely stops you!  Please schedule a follow up visit in 3 months but call sooner if needed pft's > not done       09/10/18 NP eval/ aecopd aecopd x one week rec Continue Symbicort 160  Restart Spiriva Respimat 2.5 Continue prednisone Continue doxycycline DuoNeb nebulized medications prescribed today >>> If using more than 1-2 times a day please contact our office    09/21/2018 extended f/u  ov/Chase Lloyd re:  GOLD III/ group D with refractory sob  Chief Complaint  Patient presents with  . Follow-up    Breathing is worse x 2 days. He is winded just walking from room to room at home. He is not sleeping well. He is having a right temporal HA.  He is coughing with yellow sputum.    Dyspnea:  Room to room, neb helps the most  Cough: thick yellow   Esp in am  Sleeping: poorly due to sob one pillow either side  SABA use: both hfa and neb now but uses grandson's neb / hfa poor  02: no 02  rec Plan A = Automatic = symbicort 160 Take 2 puffs first thing in am and then another 2 puffs about 12 hours later ok to use spirviva in evening = 1.25 (blue top) x 4 or 2.5 (green) x 2  Plan B = Backup Only use your albuterol inhaler as a rescue medication Plan C = Crisis - only use your albuterol nebulizer if you first try Plan B and it fails to help > ok to use the nebulizer up to every 4 hours but if start needing it regularly call for immediate appointment Plan D = Doctor - call me if B and C not adequate Plan E = ER - go to ER or call 911 if all else fails   Prednisone  10 mg Take 4 for three days 3 for three days 2 for three days 1 for three days and stop  Levaquin 500 mg one daily x 7 days - stop if aches in tendons  While coughing / wheezing > prilosec 40 mg Take 30- 60 min before your first and last meals of the day  GERD   Please schedule a follow up office visit in 4 weeks    Admission date:  10/06/2018   Discharge Date:  10/12/2018  Discharge Diagnosis  Hypertensive emergency [I16.1] Dyspnea, unspecified type [R06.00]   Principal Problem:   COPD with acute exacerbation ([place]    Acute respiratory distress   Hypertensive urgency   Troponin I above reference range   Dyspnea        10/22/2018  Post hosp f/u ov/Chase Lloyd re:  GOLD III / still on 30 mg pred / no longer smoking at all maint on symb/spiriva Chief Complaint  Patient presents with  . Follow-up    Pt has some SOB with exertion, productive cough-yellow, and some wheezing in last week. He rarely uses his albuterol inhaler. He uses his neb 3 x daily.   Dyspnea:  MMRC3 = can't walk 100 yards even at a slow pace at a flat grade s stopping due to sob   Cough:  Better / some am congestion  Sleeping: bed is flat, sleeps on side SABA use: albuterol neb w/in 15 min of rising daily   02: 2lpm hs and some daytime  rec Plan A = Automatic =  symbicort 160 Take 2 puffs first thing in am chase with Spiriva  and then another 2 puffs about 12 hours later  Plan B = Backup Only use your albuterol inhaler  Plan C = Crisis - only use your albuterol nebulizer Continue to taper off the prednisone  Continue Prilosec 40 mg Take 30- 60 min before your first and last meals of the day  GERD  Diet         12/03/2018  f/u ov/Chase Lloyd re:   GOLD III/ maint sym/spiriva/ off prednisone/  Chief Complaint  Patient presents with  . Follow-up    Breathing has improved some. He uses his albuterol inhaler about once per wk.   Dyspnea: hip stopping first  Cough: some nasal/ thraot congestion Sleeping: bed is flat  SABA use: rarely 02: 2lpm hs  rec No change rx     05/10/2019  f/u ov/Chase Lloyd re:  GOLD III/  maint symb  Over cigs since 06/2019  - not using spiriva consistently  Chief Complaint  Patient presents with  . Follow-up    Breathing is about the same. He is using his albuterol inhaler 2 x per wk on average.   Dyspnea:  Can walk a mile with rolling walker stopping a couple times due to sob/ also limited by hip pain   Cough: min throat clearing  Sleeping: bed is flat/ one pillow  SABA use: rarely needed 02: not using much at all  rec Plan A = Automatic = symbicort 160 Take 2 puffs first thing in am and then another 2 puffs about 12 hours later.  Stop spiriva for now (it's like high octane fuel) Plan B = Backup Only use your albuterol inhaler as a rescue medication  Plan C = Crisis - only use your albuterol nebulizer if you first try Plan B and it fails to help > ok to use the nebulizer up to every 4 hours but if start needing it regularly call for immediate appointment   07/19/2019 acute extended ov/Chase Lloyd re:  GOLD III worse sob x 6 weeks gradually which corresponds roughly to time off spiriva and just on symb 160 2bid  Chief Complaint  Patient presents with  . Acute Visit     Increased SOB x 6 wks. He has also noticed some wheezing. He is using neb every am but unsure name of medication. He is using his albuterol inhaler 1-2 x per day.   indolent onset gradually worse doe now to point = MMRC4  = sob if tries to leave home or while getting dressed   Assoc some wheeze/ phelgm in throat sensation but really no excess or purulent sputum  Sleeping in a flat bed/ one pillow under head/ no am wakening or flares  02 has it only uses sometimes at hs  Saba: too much saba use noted  rec Omeprazole 40 mg  Take 30- 60 min before your first and last meals of the day  GERD diet   Prednisone 10 mg take  4 each am x 2 days,   2 each am x 2 days,  1 each am x 2 days and stop Plan A = Automatic/Always >>>  symbicort 160 x 2 pffs, chase with 2 puffs of spiriva and then 12 hours just use the symbicort 160 x 2 puffs  Plan B = Backup Only use your albuterol inhaler as a rescue medication  Plan C = Crisis - only use your albuterol nebulizer if you first try Plan B and  it fails to help > ok to use the nebulizer up to every 4 hours but if start needing it regularly call for immediate appointment Please remember to go to the lab and x-ray department   for your tests - we will call you with the results when they are available.        Phone visit  08/02/2019 Wear 02 if walking beyond "across the room" at 2lpm or whatever it takes to keep you over 90% (like you did for the ER doctors who documented your breathing was much better while walking on 2lpm 1) Albtuerol  is for relief of sob that does not improve by walking a slower pace or resting but rather if the pt does not improve after trying this first. 2)If you are convinced, as many are, that saba helps recover from activity faster then it's easy to tell if this is the case by re-challenging : ie stop, take the inhaler, then p 5 minutes try the exact same activity (intensity of workload) that just caused the symptoms and see if they are  substantially diminished or not after saba 3) if there is an activity that consistently causes the same symptoms, try the albuterol 15 min before that same activity on alternate days but also try the nebulizer form of albuterol at least once to see if it makes a difference. If in fact the Albuerol  really does help, then fine to continue to use it as needed but   may need to look closer at the maintenance regimen being used to achieve better control of airways disease with exertion.   08/19/2019  f/u ov/Chase Lloyd re: GOLD III  Chief Complaint  Patient presents with  . Follow-up    Breathing is unchanged since the last visit. He is using the albuterol inhaler 3-4 x per wk and neb about 2 x per wk.   Dyspnea:  Uses walker / room to room on 2lpm sats usually ok but still give out  Cough: mucus turned yellow esp in am  Sleeping: flat/no pillows  SABA use: neb first thing  02: 2lpm hs  rec Omeprazole should be Take 30- 60 min before your first and last meals of the day  zpak should turned the mucus clear  Maintain your 02 above 90% at all times  Plan A = Automatic = Always=   Trelegy one click each am - take two good drags  Plan B = Backup (to supplement plan A, not to replace it) Only use your albuterol inhaler as a rescue medication  Plan C = Crisis (instead of Plan B but only if Plan B stops working) - only use your albuterol nebulizer if you first try Plan B  We will check on home rehab    09/02/2019  f/u ov/Chase Lloyd re:  GOLD III/ ex desat  Chief Complaint  Patient presents with  . Follow-up    Breathing is some better on trelegy. He has not used albuterol inhaler or neb.   Dyspnea:  Now walking to mb slt uphill  on way back to house Cough: better  Sleeping: flat/  One pillow  SABA use: no saba hfa or neb needed since on trelegy  02: 2lpm hs, as needed daytime   No obvious day to day or daytime variability or assoc excess/ purulent sputum or mucus plugs or hemoptysis or cp or chest  tightness, subjective wheeze or overt sinus or hb symptoms.   Sleeping  without nocturnal  or early am exacerbation  of respiratory  c/o's or need for noct saba. Also denies any obvious fluctuation of symptoms with weather or environmental changes or other aggravating or alleviating factors except as outlined above   No unusual exposure hx or h/o childhood pna/ asthma or knowledge of premature birth.  Current Allergies, Complete Past Medical History, Past Surgical History, Family History, and Social History were reviewed in [employer] record.  ROS  The following are not active complaints unless bolded Hoarseness, sore throat, dysphagia, dental problems, itching, sneezing,  nasal congestion or discharge of excess mucus or purulent secretions, ear ache,   fever, chills, sweats, unintended wt loss or wt gain, classically pleuritic or exertional cp,  orthopnea pnd or arm/hand swelling  or leg swelling, presyncope, palpitations, abdominal pain, anorexia, nausea, vomiting, diarrhea  or change in bowel habits or change in bladder habits, change in stools or change in urine, dysuria, hematuria,  rash, arthralgias, visual complaints, headache, numbness, weakness or ataxia or problems with walking or coordination,  change in mood or  memory.        Current Meds  Medication Sig  . albuterol (PROVENTIL) (2.5 MG/3ML) 0.083% nebulizer solution Take 3 mLs (2.5 mg total) by nebulization every 4 (four) hours as needed for wheezing or shortness of breath.  . Albuterol Sulfate 108 (90 Base) MCG/ACT AEPB Inhale 1-2 puffs into the lungs every 6 (six) hours as needed (wheezing/shortness of breath).  Marland Kitchen aspirin EC 81 MG tablet Take 81 mg by mouth daily.  . Fluticasone-Umeclidin-Vilant (TRELEGY ELLIPTA) 100-62.5-25 MCG/INH AEPB Inhale 1 puff into the lungs daily.  . furosemide (LASIX) 20 MG tablet Take 1 tablet (20 mg total) by mouth daily.  Marland Kitchen ibuprofen (ADVIL) 800 MG tablet TAKE 1 TABLET BY MOUTH  EVERY 8 HOURS AS NEEDED  . isosorbide mononitrate (IMDUR) 30 MG 24 hr tablet Take 1 tablet (30 mg total) by mouth daily.  . metoprolol succinate (TOPROL-XL) 25 MG 24 hr tablet Take 1 tablet (25 mg total) by mouth daily.  Marland Kitchen omeprazole (PRILOSEC) 40 MG capsule Take 1 capsule (40 mg total) by mouth 2 (two) times daily.  . OXYGEN 2LPM AS NEEDED PER PT  . tamsulosin (FLOMAX) 0.4 MG CAPS capsule Take 1 capsule (0.4 mg total) by mouth daily.                    Objective:   Physical Exam  amb  wm nad   09/02/2019        200 08/19/2019        199  07/19/2019          196  05/10/2019            192  12/03/2018          189 10/22/2018      184 09/21/2018      179  04/28/2018        173  07/30/2017        173  05/29/17 168 lb (76.2 kg)  05/01/17 164 lb (74.4 kg)  04/15/17 162 lb (73.5 kg)     Vital signs reviewed - Note on arrival 02 sats  96% on RA        reports: full dentures  HEENT : pt wearing mask not removed for exam due to covid -19 concerns.    NECK :  without JVD/Nodes/TM/ nl carotid upstrokes bilaterally   LUNGS: no acc muscle use,  Mod barrel  contour chest wall with bilateral  Distant bs  s audible wheeze and  without cough on insp or exp maneuvers and mod  Hyperresonant  to  percussion bilaterally     CV:  RRR  no s3 or murmur or increase in P2, and no edema   ABD:  soft and nontender with pos mid insp Hoover's  in the supine position. No bruits or organomegaly appreciated, bowel sounds nl  MS:     ext warm without deformities, calf tenderness, cyanosis or clubbing No obvious joint restrictions   SKIN: warm and dry without lesions    NEURO:  alert, approp, nl sensorium with  no motor or cerebellar deficits apparent.          I personally reviewed images and agree with radiology impression as follows:   Chest CTa 07/30/19 1. No pulmonary emboli identified. 2. There is a rounded masslike opacity associated with the right basilar effusion which is stable to  slightly smaller when compared to October 04, 2018. This finding could represent rounded atelectasis or neoplasm.  3. Bilateral pleural effusions.  Mild chronic pulmonary edema. 4. Mild atherosclerotic changes in the nonaneurysmal aorta. 5. Coronary artery calcifications. 6. Radiation changes in the chest. 7. Emphysema.            Assessment & Plan:

## 2019-09-02 NOTE — Telephone Encounter (Signed)
LMTCB

## 2019-09-02 NOTE — Telephone Encounter (Signed)
Received warning that transmission failed to the pharmacy.  Resent by e-scribe.

## 2019-09-02 NOTE — Patient Instructions (Addendum)
Goal is to keep 02 sats above 90% at all times   Wear 2lpm at bedtime  And as needed during the day   If can't afford trelegy change back to symb/ spiriva   Please schedule a follow up visit in 3 months but call sooner if needed

## 2019-09-02 NOTE — Addendum Note (Signed)
Addended by: Miles Costain T on: 09/02/2019 06:56 AM   Modules accepted: Orders

## 2019-09-03 ENCOUNTER — Encounter: Payer: Self-pay | Admitting: Internal Medicine

## 2019-09-03 NOTE — Assessment & Plan Note (Signed)
Quit smoking 06/2018  -  Spirometry 05/29/2017  FEV1 1.37 (39%)  Ratio 47 p am symb x2 and spirva x one and abn effort dep portion  - 05/29/2017   > rec  change  spiriva to  respimat and symb 160 2bid s spacer   - PFT's  07/30/2017  FEV1 1.39 (38 % ) ratio 48  p 23 % improvement from saba p nothing prior to study with DLCO  39/42 % corrects to 59 % for alv volume  - PFT's  10/22/2018  FEV1 1.26 (35 % ) ratio 44  p 14 % improvement from saba p symb x2  prior to study with DLCO  37/43c % corrects to 67 % for alv volume  - alpha one AT screen 12/03/2018   GM  Level 155  - 12/15/2018 notified that his insurance doesn't cover rehab  07/19/2019   restart spiriva - 08/19/2019 changed to trelegy due to no better on spiriva/symb - 08/19/2019 try again for home pulmonary rehab > improving on rehab as of 09/02/2019 f/u ov and better on trelegy vs spiriva/symb  Delighted doing some better though may not be able to afford trelegy.  Advised:  formulary restrictions will be an ongoing challenge for the forseable future and I would be happy to pick an alternative if the pt will first  provide me a list of them -  pt  will need to return here for training for any new device that is required eg dpi vs hfa vs respimat.    In the meantime we can always provide samples so that the patient never runs out of any needed respiratory medications.

## 2019-09-03 NOTE — Assessment & Plan Note (Signed)
See CT 07/30/19  RLL rounded density assoc with improving effusion on R ? Rounded atx vs neoplasm but smaller vs 10/04/2018 so neoplasm less likely   Clearly not a candidate for any intervention based on risk benefit in what is likely a benign process though agree malignancy has not been excluded - the stability of the tiny assoc effusion is also good evidence for benignancy here.

## 2019-09-03 NOTE — Assessment & Plan Note (Signed)
HC03     07/19/2019  = 33  - ER eval 07/30/19 Patient ambulated on 2 L of oxygen with oxygen saturations above 99%.  He denies any shortness of breath.  As of 09/02/2019 rec 2lpm hs and prn with activity beyond "across the room" with goal of keeping sats > 90% at all times   >>> Advised either slow down a bit or wear the ambulatory 02 to reach this goal

## 2019-09-04 ENCOUNTER — Telehealth: Payer: Self-pay | Admitting: *Deleted

## 2019-09-04 NOTE — Telephone Encounter (Signed)
Patient wife called. Wife reports her husband has been exposed to a neighbor who tested positive for COVID. States no current symptoms.  Wife reports she is still waiting for a call back, wanting to get to an open site for Covid testing today before they close.

## 2019-09-05 DIAGNOSIS — J449 Chronic obstructive pulmonary disease, unspecified: Secondary | ICD-10-CM | POA: Diagnosis not present

## 2019-09-05 DIAGNOSIS — J9611 Chronic respiratory failure with hypoxia: Secondary | ICD-10-CM | POA: Diagnosis not present

## 2019-09-05 DIAGNOSIS — M1611 Unilateral primary osteoarthritis, right hip: Secondary | ICD-10-CM | POA: Diagnosis not present

## 2019-09-05 DIAGNOSIS — J9612 Chronic respiratory failure with hypercapnia: Secondary | ICD-10-CM | POA: Diagnosis not present

## 2019-09-05 DIAGNOSIS — J90 Pleural effusion, not elsewhere classified: Secondary | ICD-10-CM | POA: Diagnosis not present

## 2019-09-05 DIAGNOSIS — J811 Chronic pulmonary edema: Secondary | ICD-10-CM | POA: Diagnosis not present

## 2019-09-05 NOTE — Telephone Encounter (Signed)
LMTCB x2 for Bruce.

## 2019-09-05 NOTE — Telephone Encounter (Signed)
Patient has a virtual appointment with [name] NP, 09/06/2019

## 2019-09-06 ENCOUNTER — Other Ambulatory Visit: Payer: Self-pay

## 2019-09-06 ENCOUNTER — Telehealth: Payer: Medicare Other | Admitting: Adult Health

## 2019-09-06 NOTE — Telephone Encounter (Signed)
atc Bruce unable to reach , left message to call back

## 2019-09-06 NOTE — Telephone Encounter (Signed)
Bruce is returning phone call. Bruce phone number is [phone].

## 2019-09-06 NOTE — Telephone Encounter (Signed)
I called Chase Lloyd but there was no answer. I left him a message to call me back.

## 2019-09-07 ENCOUNTER — Telehealth (INDEPENDENT_AMBULATORY_CARE_PROVIDER_SITE_OTHER): Payer: Medicare Other | Admitting: Adult Health

## 2019-09-07 DIAGNOSIS — I2510 Atherosclerotic heart disease of native coronary artery without angina pectoris: Secondary | ICD-10-CM | POA: Diagnosis not present

## 2019-09-07 DIAGNOSIS — N400 Enlarged prostate without lower urinary tract symptoms: Secondary | ICD-10-CM

## 2019-09-07 DIAGNOSIS — I2584 Coronary atherosclerosis due to calcified coronary lesion: Secondary | ICD-10-CM | POA: Diagnosis not present

## 2019-09-07 NOTE — Progress Notes (Signed)
Virtual Visit via Video Note  I connected with Chase Lloyd on 09/07/19 at  7:00 AM EST by a video enabled telemedicine application and verified that I am speaking with the correct person using two identifiers.  Location patient: home Location provider:work or home office Persons participating in the virtual visit: patient, provider  I discussed the limitations of evaluation and management by telemedicine and the availability of in person appointments. The patient expressed understanding and agreed to proceed.   HPI: 63 year old male who is being evaluated today for follow-up regarding BPH.  At the end of August 2020 he was started on Flomax for urinary symptoms including nocturia, incontinence, decreased stream, incomplete bladder emptying.  He reports that since starting Flomax he is getting up less frequently at night to urinate and he has significant improvement in incontinence symptoms.  His stream continues to be decreased and he sometimes feels as though he does not empty his bladder completely.  The biggest side effect that he has noticed is "dry orgasm".  This is not enough to stop him from discontinuing the medication.  He would like to continue on current dose   ROS: See pertinent positives and negatives per HPI.  Past Medical History:  Diagnosis Date  . Back pain   . COPD (chronic obstructive pulmonary disease) ([place])   . DDD (degenerative disc disease), lumbar   . Depression   . Emphysema of lung ([place])   . Empyema ([place])    2016  (was in [place])  . GERD (gastroesophageal reflux disease)   . Hearing loss of right ear    started 2001.     microphone in right ear ,  and hearing aid in left  . Hodgkin's disease ([place]) 1999  . Hypotension   . Meniere's disease   . Pneumonia   . Tinnitus     Past Surgical History:  Procedure Laterality Date  . BACK SURGERY    . CERVICAL FUSION  2016  . ELBOW SURGERY    . EYE SURGERY    . IR THORACENTESIS ASP PLEURAL SPACE W/IMG GUIDE   10/08/2018  . LAMINECTOMY  1999  . lower back surgery    . RIGHT/LEFT HEART CATH AND CORONARY ANGIOGRAPHY N/A 04/29/2019   Procedure: RIGHT/LEFT HEART CATH AND CORONARY ANGIOGRAPHY;  Surgeon: Troy Sine, MD;  Location: [facility];  Service: Cardiovascular;  Laterality: N/A;  . TONSILLECTOMY      Family History  Problem Relation Age of Onset  . Hypertension Father   . Diabetes Father   . Testicular cancer Brother   . Melanoma Brother      Current Outpatient Medications:  .  albuterol (PROVENTIL) (2.5 MG/3ML) 0.083% nebulizer solution, Take 3 mLs (2.5 mg total) by nebulization every 4 (four) hours as needed for wheezing or shortness of breath., Disp: 75 mL, Rfl: 12 .  Albuterol Sulfate 108 (90 Base) MCG/ACT AEPB, Inhale 1-2 puffs into the lungs every 6 (six) hours as needed (wheezing/shortness of breath)., Disp: 1 each, Rfl: 0 .  aspirin EC 81 MG tablet, Take 81 mg by mouth daily., Disp: , Rfl:  .  Fluticasone-Umeclidin-Vilant (TRELEGY ELLIPTA) 100-62.5-25 MCG/INH AEPB, Inhale 1 puff into the lungs daily., Disp: 28 each, Rfl: 11 .  furosemide (LASIX) 20 MG tablet, Take 1 tablet (20 mg total) by mouth daily., Disp: 30 tablet, Rfl: 11 .  ibuprofen (ADVIL) 800 MG tablet, TAKE 1 TABLET BY MOUTH EVERY 8 HOURS AS NEEDED, Disp: 180 tablet, Rfl: 0 .  isosorbide mononitrate (IMDUR) 30 MG 24 hr tablet, Take 1 tablet (30 mg total) by mouth daily., Disp: 30 tablet, Rfl: 6 .  metoprolol succinate (TOPROL-XL) 25 MG 24 hr tablet, Take 1 tablet (25 mg total) by mouth daily., Disp: 90 tablet, Rfl: 3 .  omeprazole (PRILOSEC) 40 MG capsule, Take 1 capsule (40 mg total) by mouth 2 (two) times daily., Disp: 60 capsule, Rfl: 11 .  OXYGEN, 2LPM AS NEEDED PER PT, Disp: , Rfl:  .  rosuvastatin (CRESTOR) 10 MG tablet, Take 1 tablet (10 mg total) by mouth daily., Disp: 90 tablet, Rfl: 3 .  tamsulosin (FLOMAX) 0.4 MG CAPS capsule, Take 1 capsule (0.4 mg total) by mouth daily., Disp: 90 capsule, Rfl:  0  EXAM:  VITALS per patient if applicable:  GENERAL: alert, oriented, appears well and in no acute distress  HEENT: atraumatic, conjunttiva clear, no obvious abnormalities on inspection of external nose and ears  NECK: normal movements of the head and neck  LUNGS: on inspection no signs of respiratory distress, breathing rate appears normal, no obvious gross SOB, gasping or wheezing  CV: no obvious cyanosis  MS: moves all visible extremities without noticeable abnormality  PSYCH/NEURO: pleasant and cooperative, no obvious depression or anxiety, speech and thought processing grossly intact  ASSESSMENT AND PLAN:  Discussed the following assessment and plan:  1. BPH without obstruction/lower urinary tract symptoms -Improvement in some symptoms.  We will continue on Flomax 0.4 mg.  He was advised to follow-up as needed.     I discussed the assessment and treatment plan with the patient. The patient was provided an opportunity to ask questions and all were answered. The patient agreed with the plan and demonstrated an understanding of the instructions.   The patient was advised to call back or seek an in-person evaluation if the symptoms worsen or if the condition fails to improve as anticipated.   [name], NP

## 2019-09-08 DIAGNOSIS — M1611 Unilateral primary osteoarthritis, right hip: Secondary | ICD-10-CM | POA: Diagnosis not present

## 2019-09-08 DIAGNOSIS — J811 Chronic pulmonary edema: Secondary | ICD-10-CM | POA: Diagnosis not present

## 2019-09-08 DIAGNOSIS — J9611 Chronic respiratory failure with hypoxia: Secondary | ICD-10-CM | POA: Diagnosis not present

## 2019-09-08 DIAGNOSIS — J449 Chronic obstructive pulmonary disease, unspecified: Secondary | ICD-10-CM | POA: Diagnosis not present

## 2019-09-08 DIAGNOSIS — J9612 Chronic respiratory failure with hypercapnia: Secondary | ICD-10-CM | POA: Diagnosis not present

## 2019-09-08 DIAGNOSIS — J90 Pleural effusion, not elsewhere classified: Secondary | ICD-10-CM | POA: Diagnosis not present

## 2019-09-08 NOTE — Telephone Encounter (Signed)
lmtcb for Chase Lloyd, OT with [facility]. Will need to speak with him when he calls back as it has been difficult to get in touch with him.

## 2019-09-09 NOTE — Telephone Encounter (Signed)
LMTCB x3 for Chase Lloyd. We have attempted to contact Chase Lloyd several times with no success or call back from him. Per triage protocol, message will be closed.

## 2019-09-14 DIAGNOSIS — J449 Chronic obstructive pulmonary disease, unspecified: Secondary | ICD-10-CM | POA: Diagnosis not present

## 2019-09-14 DIAGNOSIS — J811 Chronic pulmonary edema: Secondary | ICD-10-CM | POA: Diagnosis not present

## 2019-09-14 DIAGNOSIS — J9611 Chronic respiratory failure with hypoxia: Secondary | ICD-10-CM | POA: Diagnosis not present

## 2019-09-14 DIAGNOSIS — J9612 Chronic respiratory failure with hypercapnia: Secondary | ICD-10-CM | POA: Diagnosis not present

## 2019-09-14 DIAGNOSIS — M1611 Unilateral primary osteoarthritis, right hip: Secondary | ICD-10-CM | POA: Diagnosis not present

## 2019-09-14 DIAGNOSIS — J90 Pleural effusion, not elsewhere classified: Secondary | ICD-10-CM | POA: Diagnosis not present

## 2019-09-17 ENCOUNTER — Other Ambulatory Visit: Payer: Self-pay | Admitting: Adult Health

## 2019-09-28 ENCOUNTER — Other Ambulatory Visit: Payer: Self-pay

## 2019-11-01 ENCOUNTER — Telehealth: Payer: Self-pay | Admitting: Internal Medicine

## 2019-11-01 ENCOUNTER — Telehealth: Payer: Self-pay

## 2019-11-01 ENCOUNTER — Ambulatory Visit (INDEPENDENT_AMBULATORY_CARE_PROVIDER_SITE_OTHER): Payer: Medicare Other | Admitting: Internal Medicine

## 2019-11-01 ENCOUNTER — Encounter: Payer: Self-pay | Admitting: Internal Medicine

## 2019-11-01 ENCOUNTER — Other Ambulatory Visit: Payer: Self-pay

## 2019-11-01 DIAGNOSIS — J9612 Chronic respiratory failure with hypercapnia: Secondary | ICD-10-CM | POA: Diagnosis not present

## 2019-11-01 DIAGNOSIS — J9611 Chronic respiratory failure with hypoxia: Secondary | ICD-10-CM | POA: Diagnosis not present

## 2019-11-01 DIAGNOSIS — J449 Chronic obstructive pulmonary disease, unspecified: Secondary | ICD-10-CM

## 2019-11-01 MED ORDER — AZITHROMYCIN 250 MG PO TABS: ORAL_TABLET | ORAL | 0 refills | Status: DC

## 2019-11-01 MED ORDER — SPIRIVA RESPIMAT 2.5 MCG/ACT IN AERS: INHALATION_SPRAY | RESPIRATORY_TRACT | Status: DC

## 2019-11-01 MED ORDER — PREDNISONE 10 MG PO TABS: ORAL_TABLET | ORAL | 0 refills | Status: DC

## 2019-11-01 MED ORDER — BUDESONIDE-FORMOTEROL FUMARATE 160-4.5 MCG/ACT IN AERO: INHALATION_SPRAY | RESPIRATORY_TRACT | Status: DC

## 2019-11-01 NOTE — Assessment & Plan Note (Signed)
Quit smoking 06/2018  -  Spirometry 05/29/2017  FEV1 1.37 (39%)  Ratio 47 p am symb x2 and spirva x one and abn effort dep portion  - 05/29/2017   > rec  change  spiriva to  respimat and symb 160 2bid s spacer   - PFT's  07/30/2017  FEV1 1.39 (38 % ) ratio 48  p 23 % improvement from saba p nothing prior to study with DLCO  39/42 % corrects to 59 % for alv volume  - PFT's  10/22/2018  FEV1 1.26 (35 % ) ratio 44  p 14 % improvement from saba p symb x2  prior to study with DLCO  37/43c % corrects to 67 % for alv volume  - alpha one AT screen 12/03/2018   GM  Level 155  - 12/15/2018 notified that his insurance doesn't cover rehab  07/19/2019   restart spiriva - 08/19/2019 changed to trelegy due to no better on spiriva/symb - 08/19/2019 try again for home pulmonary rehab > improving on rehab as of 09/02/2019 f/u ov and better on trelegy vs spiriva/symb - 11/01/2019 requesting change back to symb/spiriva    Group D in terms of symptom/risk and laba/lama/ICS  therefore appropriate rx at this point >>>  Symb/spiriva vs trelegy best bets as some of his symptoms of "throat congestion" have developed while on DPI (trelegy) and not actually from copd at all and further evidence for this is the fact he's not even using his saba as I had instructed (up to every 4 hours if needed)   But for apparent component that is ? aecopd > pred/ zpak called in and f/u in one week with all meds in hand using a trust but verify approach to confirm accurate Medication  Reconciliation The principal here is that until we are certain that the  patients are doing what we've asked, it makes no sense to ask them to do more.

## 2019-11-01 NOTE — Assessment & Plan Note (Signed)
[facility]     07/19/2019  = 33  - ER eval 07/30/19 Patient ambulated on 2 L of oxygen with oxygen saturations above 99%.  He denies any shortness of breath.  As of 11/01/2019 rec 2lpm hs and prn with activity beyond "across the room" with goal of keeping sats > 90% at all times   He is confused between instrutions for sob and the recs for use of 02 which are not mutually exclusive ie he titrates 02 according to sats, not sob.  He repeatedly demonstrated that he misunderstood that concept and requested I explain the mechanisms of sob vs hypoxemia over the phone which I declined to do but asked him to instead focus of following the instructions he's already been giving before asking for more or will likely end up in ER, which I have tried to help him avoid.   Each maintenance medication was reviewed in detail including most importantly the difference between maintenance and as needed and under what circumstances the prns are to be used.  Please see AVS for specific  Instructions which are unique to this visit and I personally typed out  which were reviewed in detail over the phone with the patient and a copy provided via [place].

## 2019-11-01 NOTE — Telephone Encounter (Signed)
Fine with me but be sure he brings all active meds with him to office

## 2019-11-01 NOTE — Telephone Encounter (Signed)
Dr. Shearon Stalls is here that day with the same time slot available. Pt has been seeing MW for COPD. Dr. Shearon Stalls please advise if you're ok with taking on this patient.   MW please advise if you're ok with this patient switching.  Thanks!

## 2019-11-01 NOTE — Patient Instructions (Addendum)
Omeprazole should be Take 30- 60 min before your first and last meals of the day   zpak   Prednisone 10 mg take  4 each am x 2 days,   2 each am x 2 days,  1 each am x 2 days and stop   Maintain your 02 above 90% at all times by adjusting your 02 flow upward - you will need different flows depending on whether this is sitting or walking or continuous flow vs pulsed  Plan A = Automatic = Always= stop trelegy/  Symbicort 160 / spiriva 2 puffs of each am and then symbicort 160 x 2 pffs in pm    Plan B = Backup (to supplement plan A, not to replace it) Only use your albuterol inhaler as a rescue medication to be used if you can't catch your breath by resting or doing a relaxed purse lip breathing pattern.  - The less you use it, the better it will work when you need it. - Ok to use the inhaler up to 2 puffs  every 4 hours if you must but call for appointment if use goes up over your usual need - Don't leave home without it !!  (think of it like the spare tire for your car)   Plan C = Crisis (instead of Plan B but only if Plan B stops working) - only use your albuterol nebulizer if you first try Plan B and it fails to help > ok to use the nebulizer up to every 4 hours until your next visit but if can't get comfortable at rest after the nebulizer you will need to go to ER   Keep your appt but bring all your medications with you to office  - we may want to start you on BREZTRI which is the combination of symbicort and spiriva (if your insurance will cover)

## 2019-11-01 NOTE — Progress Notes (Signed)
Subjective:   Patient ID: Chase Lloyd, male    DOB: 1956-10-21,     MRN: [id number]    Brief patient profile:  72 yowm MG phenotype  quit smoking 8/2080from  [facility]  first aware of doe 2013 dx as copd rx symbicort helped a lot then pna/ sepsis Feb 2017 and added spiriva and rx 02 short term and moved to [place] may 2018 and referred to pulmonary clinic 05/29/2017 by [name] with GOLD III spirometry  at initial eval    History of Present Illness  05/29/2017 1st [facility] office visit/ Sophie Quiles  Maint rx symb 160 2bid / spiriva dpi  Chief Complaint  Patient presents with  . Pulmonary Consult    Pt referred by Dr. [name] for COPD. Pt c/o DOE, prod cough with yellow mucus. Pt denies CP/tightness and f/c/s. Pt states he rarely uses his albuterol hfa.   ok at rest, sleeps fine with improving cough since quit smoking  Left leg swelling x months assoc with hip pain > ortho following with 'neg sonogram"  per pt  rec Plan A = Automatic = Symbicort 160 Take 2 puffs first thing in am and then another 2 puffs about 12 hours later and spiriva 2 puffs in am only (ok to use up the powder form if you want)  Work on inhaler technique:  relax and gently blow all the way out then take a nice smooth deep breath back in, triggering the inhaler at same time you start breathing in.  Hold for up to 5 seconds if you can. Blow out thru nose. Rinse and gargle with water when done Plan B = Backup Only use your albuterol as a rescue medication   07/30/2017  f/u ov/Jordain Radin re:  GOLD III with reversibility / symb 160/spiriva smi Chief Complaint  Patient presents with  . Follow-up    PFT's done today. His breathing is doing well. He has not had to use his rescue inhaler. No new co's today.   Not limited by breathing from desired activities but  Due to back and hip and leg issues  Sleeping fine/ no noct saba or am flares rec Work on maintaining perfect inhaler technique:  relax and gently blow all the way  out then take a nice smooth deep breath back in, triggering the inhaler at same time you start breathing in.    04/28/2018 acute extended ov/[name] re:  GOLD III/  Chief Complaint  Patient presents with  . Acute Visit    increased SOB x 2 months. He also c/o occ cough- prod at times with yellow sputum. He states he was just told by PCP today that he had bronchitis based on recent cxr 04/26/18. He is using his albuterol inhaler about every other day.   dyspnea gradually worse x 2 months to point = .MMRC2 = can't walk a nl pace on a flat grade s sob but does fine slow and flat but also limited back and L hip pain 02 none  saba qod  Dysphagia x years on prilosec q am but not ac  Off spiriva x months not convinced made any difference Already on pred/doxy per PCP since 04/26/18  rec Try taking omeprazole Take 30-60 min before first meal of the day  GERD diet   Plan A = Automatic = Symbicort  160 Take 2 puffs first thing in am and then another 2 puffs about 12 hours later.  Plan B = Backup Only use your albuterol  as a rescue medication   Finish the prednisone and antibiotic arleady prescribed  The key is to stop smoking completely before smoking completely stops you!  Please schedule a follow up visit in 3 months but call sooner if needed pft's > not done       09/10/18 NP eval/ aecopd aecopd x one week rec Continue Symbicort 160  Restart Spiriva Respimat 2.5 Continue prednisone Continue doxycycline DuoNeb nebulized medications prescribed today >>> If using more than 1-2 times a day please contact our office    09/21/2018 extended f/u  ov/[name] re:  GOLD III/ group D with refractory sob  Chief Complaint  Patient presents with  . Follow-up    Breathing is worse x 2 days. He is winded just walking from room to room at home. He is not sleeping well. He is having a right temporal HA.  He is coughing with yellow sputum.    Dyspnea:  Room to room, neb helps the most  Cough: thick yellow   Esp in am  Sleeping: poorly due to sob one pillow either side  SABA use: both hfa and neb now but uses grandson's neb / hfa poor  02: no 02  rec Plan A = Automatic = symbicort 160 Take 2 puffs first thing in am and then another 2 puffs about 12 hours later ok to use spirviva in evening = 1.25 (blue top) x 4 or 2.5 (green) x 2  Plan B = Backup Only use your albuterol inhaler as a rescue medication Plan C = Crisis - only use your albuterol nebulizer if you first try Plan B and it fails to help > ok to use the nebulizer up to every 4 hours but if start needing it regularly call for immediate appointment Plan D = Doctor - call me if B and C not adequate Plan E = ER - go to ER or call 911 if all else fails   Prednisone  10 mg Take 4 for three days 3 for three days 2 for three days 1 for three days and stop  Levaquin 500 mg one daily x 7 days - stop if aches in tendons  While coughing / wheezing > prilosec 40 mg Take 30- 60 min before your first and last meals of the day  GERD   Please schedule a follow up office visit in 4 weeks    Admission date:  10/06/2018   Discharge Date:  10/12/2018  Discharge Diagnosis  Hypertensive emergency [I16.1] Dyspnea, unspecified type [R06.00]      COPD with acute exacerbation ([place]    Acute respiratory distress   Hypertensive urgency   Troponin I above reference range   Dyspnea        10/22/2018  Post hosp f/u ov/[name] re:  GOLD III / still on 30 mg pred / no longer smoking at all maint on symb/spiriva Chief Complaint  Patient presents with  . Follow-up    Pt has some SOB with exertion, productive cough-yellow, and some wheezing in last week. He rarely uses his albuterol inhaler. He uses his neb 3 x daily.   Dyspnea:  MMRC3 = can't walk 100 yards even at a slow pace at a flat grade s stopping due to sob   Cough:  Better / some am congestion  Sleeping: bed is flat, sleeps on side SABA use: albuterol neb w/in 15 min of rising daily  02: 2lpm hs and  some daytime  rec Plan A = Automatic = symbicort  160 Take 2 puffs first thing in am chase with Spiriva  and then another 2 puffs about 12 hours later  Plan B = Backup Only use your albuterol inhaler  Plan C = Crisis - only use your albuterol nebulizer Continue to taper off the prednisone  Continue Prilosec 40 mg Take 30- 60 min before your first and last meals of the day  GERD  Diet       05/10/2019  f/u ov/Gail Vendetti re:  GOLD III/  maint symb  Over cigs since 06/2018  - not using spiriva consistently  Chief Complaint  Patient presents with  . Follow-up    Breathing is about the same. He is using his albuterol inhaler 2 x per wk on average.   Dyspnea:  Can walk a mile with rolling walker stopping a couple times due to sob/ also limited by hip pain   Cough: min throat clearing  Sleeping: bed is flat/ one pillow  SABA use: rarely needed 02: not using much at all  rec Plan A = Automatic = symbicort 160 Take 2 puffs first thing in am and then another 2 puffs about 12 hours later.  Stop spiriva for now (it's like high octane fuel) Plan B = Backup Only use your albuterol inhaler as a rescue medication  Plan C = Crisis - only use your albuterol nebulizer if you first try Plan B and it fails to help > ok to use the nebulizer up to every 4 hours but if start needing it regularly call for immediate appointment   07/19/2019 acute extended ov/Nehemie Casserly re:  GOLD III worse sob x 6 weeks gradually which corresponds roughly to time off spiriva and just on symb 160 2bid  Chief Complaint  Patient presents with  . Acute Visit    Increased SOB x 6 wks. He has also noticed some wheezing. He is using neb every am but unsure name of medication. He is using his albuterol inhaler 1-2 x per day.   indolent onset gradually worse doe now to point = MMRC4  = sob if tries to leave home or while getting dressed   Assoc some wheeze/ phelgm in throat sensation but really no excess or purulent sputum  Sleeping in a flat  bed/ one pillow under head/ no am wakening or flares  02 has it only uses sometimes at hs  Saba: too much saba use noted  rec Omeprazole 40 mg  Take 30- 60 min before your first and last meals of the day  GERD diet   Prednisone 10 mg take  4 each am x 2 days,   2 each am x 2 days,  1 each am x 2 days and stop Plan A = Automatic/Always >>>  symbicort 160 x 2 pffs, chase with 2 puffs of spiriva and then 12 hours just use the symbicort 160 x 2 puffs  Plan B = Backup Only use your albuterol inhaler as a rescue medication  Plan C = Crisis - only use your albuterol nebulizer if you first try Plan B and it fails to help > ok to use the nebulizer up to every 4 hours but if start needing it regularly call for immediate appointment Please remember to go to the lab and x-ray department   for your tests - we will call you with the results when they are available.        Phone visit  08/02/2019 Wear 02 if walking beyond "across the room" at 2lpm or  whatever it takes to keep you over 90% (like you did for the ER doctors who documented your breathing was much better while walking on 2lpm rec Try albuterol 15 min before an activity that you know would make you short of breath and see if it makes any difference and if makes none then don't take it after activity unless you can't catch your breath.       08/19/2019  f/u ov/Kamareon Sciandra re: GOLD III  Chief Complaint  Patient presents with  . Follow-up    Breathing is unchanged since the last visit. He is using the albuterol inhaler 3-4 x per wk and neb about 2 x per wk.   Dyspnea:  Uses walker / room to room on 2lpm sats usually ok but still give out  Cough: mucus turned yellow esp in am  Sleeping: flat/no pillows  SABA use: neb first thing  02: 2lpm hs  rec Omeprazole should be Take 30- 60 min before your first and last meals of the day  zpak should turned the mucus clear  Maintain your 02 above 90% at all times  Plan A = Automatic = Always=   Trelegy one  click each am - take two good drags  Plan B = Backup (to supplement plan A, not to replace it) Only use your albuterol inhaler as a rescue medication  Plan C = Crisis (instead of Plan B but only if Plan B stops working) - only use your albuterol nebulizer if you first try Plan B  We will check on home rehab    09/02/2019  f/u ov/Rion Schnitzer re:  GOLD III/ ex desat  Chief Complaint  Patient presents with  . Follow-up    Breathing is some better on trelegy. He has not used albuterol inhaler or neb.   Dyspnea:  Now walking to mb slt uphill  on way back to house Cough: better  Sleeping: flat/  One pillow  SABA use: no saba hfa or neb needed since on trelegy  02: 2lpm hs, as needed daytime rec Goal is to keep 02 sats above 90% at all times  Wear 2lpm at bedtime  And as needed during the day  If can't afford trelegy change back to symb/ spiriva   PC 11/01/2019  The patient stated that since he has been on Trelegy his shortness of breath as increased. He feels like the Trelegy is not working at all. Feels he did much better on Symbicort. Used his rescue inhaler last week. Has not used it more frequently because he stated you advised him to only use it when he thinks he needs it, but when he needs it, it is after the shortness of breath as developed.  He can only walk about 10 feet and will get short of breath. Will be sitting down on and O2 will drop to 88 - 89% while on 2L continuous. Patient did not state he has turned the O2 up when this happens.  Patient stated that he still uses 2L oxygen continuous at home and pulse when he has to go out of the home.   He has also developed in the last month a cough with thick yellow chest congestion. Nasal congestion is clear.       Acute Virtual Visit via Telephone Note 11/01/2019  Re  Sob and decreasing sats   I connected with Jaymes Graff Mendia on 11/01/19 at  9:45AM EST by telephone and verified that I am speaking with the correct person using  two  identifiers.   I discussed the limitations, risks, security and privacy concerns of performing an evaluation and management service by telephone and the availability of in person appointments. I also discussed with the patient that there may be a patient responsible charge related to this service. The patient expressed understanding and agreed to proceed.   History of Present Illness: Dyspnea: gradually worse x months  Cough: thick yellow x one month es[ om a,  Sleeping: disrupted by coughing fits  2-3  Per week SABA use: rarely using neb  - not used today yet 02: 2lpm / 2lpm presently at rest/ does not check 02 on concentrator or titrate  POC as rec     No obvious day to day or daytime variability or assoc   or mucus plugs or hemoptysis or cp or chest tightness, subjective wheeze or overt sinus or hb symptoms or fever, chills.    Also denies any obvious fluctuation of symptoms with weather or environmental changes or other aggravating or alleviating factors except as outlined above.   Meds reviewed/ med reconciliation completed     Observations/Objective: Easily confused with details of care but s conversational sob/ some congested coughing on voluntary maneuver, voice texture ok   Assessment and Plan: See problem list for active a/p's   Follow Up Instructions: See avs for instructions unique to this ov which includes revised/ updated med list     I discussed the assessment and treatment plan with the patient. The patient was provided an opportunity to ask questions and all were answered. The patient agreed with the plan and demonstrated an understanding of the instructions.   The patient was advised to call back or seek an in-person evaluation if the symptoms worsen or if the condition fails to improve as anticipated.  I provided 25 minutes of non-face-to-face time during this encounter.   Christinia Gully, MD

## 2019-11-01 NOTE — Telephone Encounter (Signed)
See televisit 11/01/2019

## 2019-11-01 NOTE — Telephone Encounter (Signed)
Dr. Melvyn Novas,  This patient was last seen by you on 09/02/19 due to COPD GOLD. The patient stated that since he has been on Trelegy his shortness of breath as increased. He feels like the Trelegy is not working at all. Feels he did much better on Symbicort. Used his rescue inhaler last week. Has not used it more frequently because he stated you advised him to only use it when he thinks he needs it, but when he needs it, it is after the shortness of breath as developed.  He can only walk about 10 feet and will get short of breath. Will be sitting down on and O2 will drop to 88 - 89% while on 2L continuous. Patient did not state he has turned the O2 up when this happens.  Patient stated that he still uses 2L oxygen continuous at home and pulse when he has to go out of the home.   He has also developed in the last month a cough with thick yellow chest congestion. Nasal congestion is clear.   His wife stated that he told her that when he is getting short of breath he feels like he about to have a heart attack. She also stated that he cannot walk to the car, they have to difficult time getting him to the car without support. She asked if an order can be placed for a motorized wheelchair.  I did tell his wife during the call that I would get back to her if you were not comfortable with him having a televisit since he forgot the password for mychart.  Based on our conversation, I called the patient back and told him it would be a televisit. He was fine with that option.

## 2019-11-02 NOTE — Telephone Encounter (Signed)
Called spoke with patient.  Changed providers.  Patient voiced understanding.  Nothing further needed at this time.

## 2019-11-02 NOTE — Telephone Encounter (Signed)
Sure no problem

## 2019-11-08 ENCOUNTER — Other Ambulatory Visit: Payer: Self-pay

## 2019-11-08 ENCOUNTER — Ambulatory Visit (INDEPENDENT_AMBULATORY_CARE_PROVIDER_SITE_OTHER): Payer: Medicare Other | Admitting: Internal Medicine

## 2019-11-08 ENCOUNTER — Encounter: Payer: Self-pay | Admitting: Internal Medicine

## 2019-11-08 VITALS — BP 144/64 | HR 86 | Temp 98.1°F | Ht 70.0 in | Wt 205.2 lb

## 2019-11-08 DIAGNOSIS — J449 Chronic obstructive pulmonary disease, unspecified: Secondary | ICD-10-CM | POA: Diagnosis not present

## 2019-11-08 DIAGNOSIS — J9611 Chronic respiratory failure with hypoxia: Secondary | ICD-10-CM

## 2019-11-08 MED ORDER — ALBUTEROL SULFATE HFA 108 (90 BASE) MCG/ACT IN AERS: 2.0000 | INHALATION_SPRAY | Freq: Four times a day (QID) | RESPIRATORY_TRACT | 11 refills | Status: DC | PRN

## 2019-11-08 MED ORDER — ALBUTEROL SULFATE 108 (90 BASE) MCG/ACT IN AEPB: 1.0000 | INHALATION_SPRAY | Freq: Four times a day (QID) | RESPIRATORY_TRACT | 0 refills | Status: DC | PRN

## 2019-11-08 MED ORDER — SPIRIVA RESPIMAT 2.5 MCG/ACT IN AERS: 2.0000 | INHALATION_SPRAY | Freq: Every day | RESPIRATORY_TRACT | 0 refills | Status: DC

## 2019-11-08 MED ORDER — SPIRIVA RESPIMAT 2.5 MCG/ACT IN AERS: INHALATION_SPRAY | RESPIRATORY_TRACT | 11 refills | Status: DC

## 2019-11-08 NOTE — Patient Instructions (Signed)
The patient should have follow up scheduled with myself in 3 months.   

## 2019-11-08 NOTE — Progress Notes (Signed)
Chase Lloyd    [id number]    Dec 31, 1955  Primary Care Physician:Nafziger, Tommi Rumps, NP Date of Appointment: 11/09/2019 Established Patient Visit  Chief complaint:   Chief Complaint  Patient presents with  . Follow-up    Patient states that his breathing is getting worse. Patient wear 2L oxygen all the time. Had visit with Wert on 12/29 and got prednisone and zpak which he has finished.     HPI: Previous patient of Dr. Gustavus Bryant with COPD on home oxygen. Currently on Spiriva and recently treated with abx and steroids for COPD exacerbation.   Interval Updates:  Was previously on trelegy - now back on spiriva and Symbicort.  Takes albuterol 1-2 times/day.   Used to live in [place] Has been about 3 years since he could do everything he really wanted to do. Has declined a lot in 3 years, then had complications after back surgery in September 2019 and hospitalized for pneumonia. But mostly worsened in the last 6 months has difficulty with ADLs  He also has avascular necrosis of his left hip and his mobility is limited from this.  Wife is asking about electric wheelchair because the current wheelchair he has she is unable to push due to an injury, and he is unable to push this as well.  He would like to have less symptomatic breathlessness during the day and would like to know if increasing mobility is an option for him.  He and his wife also inquired about lung transplantation today.    I have reviewed the patient's family social and past medical history and updated as appropriate.   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    Past Medical History:  Diagnosis Date  . Back pain   . COPD (chronic obstructive pulmonary disease) ([place])   . DDD (degenerative disc disease), lumbar   . Depression   . Emphysema of lung ([place])   . Empyema ([place])    2016   (was in [place])  . GERD (gastroesophageal reflux disease)   . Hearing loss of right ear    started 2001.     microphone in right ear ,  and hearing aid in left  . Hodgkin's disease ([place]) 1999  . Hypotension   . Meniere's disease   . Pneumonia   . Tinnitus     Past Surgical History:  Procedure Laterality Date  . BACK SURGERY    . CERVICAL FUSION  2016  . ELBOW SURGERY    . EYE SURGERY    . IR THORACENTESIS ASP PLEURAL SPACE W/IMG GUIDE  10/08/2018  . LAMINECTOMY  1999  . lower back surgery    . RIGHT/LEFT HEART CATH AND CORONARY ANGIOGRAPHY N/A 04/29/2019   Procedure: RIGHT/LEFT HEART CATH AND CORONARY ANGIOGRAPHY;  Surgeon: Troy Sine, MD;  Location: [facility];  Service: Cardiovascular;  Laterality: N/A;  . TONSILLECTOMY      Family History  Problem Relation Age of Onset  . Hypertension Father   . Diabetes Father   . Testicular cancer Brother   . Melanoma Brother     Social History   Occupational History  . Occupation: Retired  Tobacco Use  . Smoking status: Former Smoker    Packs/day: 1.50    Years: 47.00    Pack years: 70.50  Types: Cigarettes    Quit date: 06/13/2018    Years since quitting: 1.4  . Smokeless tobacco: Never Used  Substance and Sexual Activity  . Alcohol use: Yes    Comment: 24 cans in a week  . Drug use: No  . Sexual activity: Not on file    Review of systems: Constitutional: No fevers, chills, night sweats, or weight loss. CV: No chest pain, or palpitations. Resp: No hemoptysis.  Physical Exam: Blood pressure (!) 144/64, pulse 86, temperature 98.1 F (36.7 C), temperature source Temporal, height 5\' 10"  (1.778 m), weight 205 lb 3.2 oz (93.1 kg), SpO2 98 %.  Gen:      No acute distress Lungs:   Diminished, no wheezes or crackles, able to complete in full sentences. CV:         Regular rate and rhythm; no murmurs, rubs, or gallops.  No pedal edema Abd:      + bowel sounds; soft, non-tender; no distension Neuro: normal  speech, no focal facial asymmetry   Data Reviewed: Imaging: I have personally reviewed the CT angio performed September 2020 which demonstrates severe centrilobular upper lobe predominant emphysema, no focal pneumonia, no PE.  PFTs:  PFT Results Latest Ref Rng & Units 10/22/2018 07/30/2017  FVC-Pre L 2.42 2.45  FVC-Predicted Pre % 50 50  FVC-Post L 2.87 2.90  FVC-Predicted Post % 59 59  Pre FEV1/FVC % % 46 46  Post FEV1/FCV % % 44 48  FEV1-Pre L 1.10 1.13  FEV1-Predicted Pre % 30 31  FEV1-Post L 1.26 1.39  DLCO UNC% % 37 39  DLCO COR %Predicted % 67 59  TLC L 5.86 -  TLC % Predicted % 81 -  RV % Predicted % 129 -   I have personally reviewed the patient's PFTs and they are notable for very severe emphysema, with an FEV1 of 30% of predicted.  These results are consistent when trended over 2018 in 2019.  Labs: CBC from September 2020 is notable for hemoglobin 11.3, platelets of 107. An alpha-1 antitrypsin serum value which was within the normal range  Immunization status: Immunization History  Administered Date(s) Administered  . Fluad Quad(high Dose 65+) 09/02/2019  . Influenza,inj,Quad PF,6+ Mos 12/27/2015, 10/11/2018  . Pneumococcal Conjugate-13 12/26/2015  . Td 01/01/2014  . Zoster 08/04/2015     Assessment:  Very Severe Emphysema FEV1 30% of predicted.  Chronic Respiratory Failure Avascular necrosis, left hip Hodgkin's disease status post mantle field radiation, now in remission  Plan/Recommendations: Chase Lloyd has very severe emphysema, and his symptoms seem to have progressed rapidly over the last 6 months especially in the setting of decreased mobility from his joint conditions and back problems.  We spent significant amount of time today discussing COPD disease progression and management.  Ultimately I had like to get him into pulmonary rehabilitation however this is closed right now due to the COVID-19 pandemic.  He may also benefit from noninvasive ventilation  at home nocturnally as his serum bicarb is 29 and does suggest some chronic CO2 retention.  We talked about lung transplantation for him, and given his other comorbid conditions, his current level of deconditioning, and his age being closer to the upper limit of normal I do not think this is a realistic expectation at this time.  I did let him know this may change but in general we have a lot of work to do in getting him reconditioned.  His wife was also asking if he would be able to  tolerate surgery for his hip, and we discussed how because he had pneumonia postoperatively following his spinal surgery last year, his risk for recurrent respiratory failure following surgery is moderate to high risk.   I spent 48 minutes on 11/09/2019 in care of this patient including face to face time and non-face to face time spent charting, review of outside records, and coordination of care.   Return to Care: Return in about 3 months (around 02/06/2020).   Lenice Llamas, MD [facility] [place]

## 2019-11-11 ENCOUNTER — Ambulatory Visit: Payer: Medicare Other | Admitting: Internal Medicine

## 2019-11-22 ENCOUNTER — Telehealth: Payer: Self-pay | Admitting: Internal Medicine

## 2019-11-22 NOTE — Telephone Encounter (Signed)
Okay for order?

## 2019-11-22 NOTE — Telephone Encounter (Signed)
Pt wife called saying her husband needs a power wheel chair.  They will need an order for this to be purchased at a home health agency.  CB#  [phone]

## 2019-11-22 NOTE — Telephone Encounter (Signed)
I spoke with pt's wife and she stated that it is hard for the patient to get around and would like a power wheelchair. She states she spoke to someone at [facility] and they stated he needed an evaluation from a physical therapist and a Rx for the wheelchair. Does the PCP need to do this? Dr. Shearon Stalls please advise if you want to proceed with order.

## 2019-11-22 NOTE — Telephone Encounter (Signed)
Yes I recommend he follow up with PCP if that is what is required. Please let his wife know.

## 2019-11-22 NOTE — Telephone Encounter (Signed)
Spoke with pt's wife, Latanya Presser. She is aware that she will need to contact pt's PCP for this. Nothing further was needed.

## 2019-11-22 NOTE — Telephone Encounter (Signed)
He will need a face to face evaluation first, we can do this virtually.

## 2019-11-23 NOTE — Telephone Encounter (Signed)
Noted  

## 2019-11-23 NOTE — Telephone Encounter (Signed)
Pt has been scheduled.  °

## 2019-11-24 ENCOUNTER — Other Ambulatory Visit: Payer: Self-pay

## 2019-11-24 ENCOUNTER — Telehealth (INDEPENDENT_AMBULATORY_CARE_PROVIDER_SITE_OTHER): Payer: Medicare Other | Admitting: Adult Health

## 2019-11-24 DIAGNOSIS — Z7689 Persons encountering health services in other specified circumstances: Secondary | ICD-10-CM

## 2019-11-24 NOTE — Progress Notes (Addendum)
Virtual Visit via Video Note  I connected with Chase Lloyd on 11/24/19 at  1:30 PM EST by a video enabled telemedicine application and verified that I am speaking with the correct person using two identifiers.  Location patient: home Location provider:work or home office Persons participating in the virtual visit: patient, provider  I discussed the limitations of evaluation and management by telemedicine and the availability of in person appointments. The patient expressed understanding and agreed to proceed.   HPI: 64 year old male who is being evaluated for a mobility assessment for power wheelchair.  He has a history of COPD on home oxygen.  It has been about 3 years since he could do everything that he really wanted to do.  Unfortunately he has declined significantly in those 3 years.  He had complications after a back surgery in September 2019 and was hospitalized for pneumonia.  He reports that over the last 6 months he has worsened more and has difficulty with ADLs.  He is having to constantly wear 2 L of oxygen via nasal cannula around-the-clock.  He is on Spiriva and Symbicort but still feels short of breath most of the time throughout the day. He reports that at home while at rest his oxygen saturation with 2 L via [place] is greater than 95%, on reviewing office notes from pulmonary his last oxygen saturations at rest were 99% to 98% at rest with 2 L via [place]. When he is exerting himself his oxygen saturation will drop to the low 90's while wearing oxygen and into the mid 80's if he is not wearing oxygen   Unfortunately, he also has avascular necrosis of the left hip which leaves his mobility limited.  He is able to walk less than 10  feet before he becomes winded or is in too much pain that he can no longer ambulate.  He rates his pain 5-8/10 with ambulation. He does feel unsteady when ambulating due to the pain he experiences in his left hip as well as the feeling of weakness in bilateral  legs.  He does have a wheelchair at home but is unable to push himself due to loss of function and his wife is unable to push him due to an injury.  He is interested in having a power wheelchair to assist him in his home in order to perform ADLs such as cooking, cleaning, toilet chain, dressing, as well as getting out of the house more often that he can enjoy aspects of his life.   ROS: See pertinent positives and negatives per HPI.  Past Medical History:  Diagnosis Date  . Back pain   . COPD (chronic obstructive pulmonary disease) ([place])   . DDD (degenerative disc disease), lumbar   . Depression   . Emphysema of lung ([place])   . Empyema ([place])    2016  (was in [place])  . GERD (gastroesophageal reflux disease)   . Hearing loss of right ear    started 2001.     microphone in right ear ,  and hearing aid in left  . Hodgkin's disease ([place]) 1999  . Hypotension   . Meniere's disease   . Pneumonia   . Tinnitus     Past Surgical History:  Procedure Laterality Date  . BACK SURGERY    . CERVICAL FUSION  2016  . ELBOW SURGERY    . EYE SURGERY    . IR THORACENTESIS ASP PLEURAL SPACE W/IMG GUIDE  10/08/2018  . LAMINECTOMY  1999  . lower back surgery    . RIGHT/LEFT HEART CATH AND CORONARY ANGIOGRAPHY N/A 04/29/2019   Procedure: RIGHT/LEFT HEART CATH AND CORONARY ANGIOGRAPHY;  Surgeon: Troy Sine, MD;  Location: [facility];  Service: Cardiovascular;  Laterality: N/A;  . TONSILLECTOMY      Family History  Problem Relation Age of Onset  . Hypertension Father   . Diabetes Father   . Testicular cancer Brother   . Melanoma Brother        Current Outpatient Medications:  .  albuterol (PROVENTIL) (2.5 MG/3ML) 0.083% nebulizer solution, Take 3 mLs (2.5 mg total) by nebulization every 4 (four) hours as needed for wheezing or shortness of breath., Disp: 75 mL, Rfl: 12 .  albuterol (VENTOLIN HFA) 108 (90 Base) MCG/ACT inhaler, Inhale 2 puffs into the lungs every 6 (six) hours  as needed for wheezing or shortness of breath., Disp: 18 g, Rfl: 11 .  Albuterol Sulfate 108 (90 Base) MCG/ACT AEPB, Inhale 1-2 puffs into the lungs every 6 (six) hours as needed (wheezing/shortness of breath)., Disp: 1 each, Rfl: 0 .  aspirin EC 81 MG tablet, Take 81 mg by mouth daily., Disp: , Rfl:  .  budesonide-formoterol (SYMBICORT) 160-4.5 MCG/ACT inhaler, Take 2 puffs first thing in am and then another 2 puffs about 12 hours later., Disp: , Rfl:  .  furosemide (LASIX) 20 MG tablet, Take 1 tablet (20 mg total) by mouth daily., Disp: 30 tablet, Rfl: 11 .  ibuprofen (ADVIL) 800 MG tablet, TAKE 1 TABLET BY MOUTH EVERY 8 HOURS AS NEEDED, Disp: 180 tablet, Rfl: 0 .  isosorbide mononitrate (IMDUR) 30 MG 24 hr tablet, Take 1 tablet (30 mg total) by mouth daily., Disp: 30 tablet, Rfl: 6 .  metoprolol succinate (TOPROL-XL) 25 MG 24 hr tablet, Take 1 tablet (25 mg total) by mouth daily., Disp: 90 tablet, Rfl: 3 .  omeprazole (PRILOSEC) 40 MG capsule, Take 1 capsule (40 mg total) by mouth 2 (two) times daily., Disp: 60 capsule, Rfl: 11 .  OXYGEN, 2LPM AS NEEDED PER PT, Disp: , Rfl:  .  rosuvastatin (CRESTOR) 10 MG tablet, Take 1 tablet (10 mg total) by mouth daily., Disp: 90 tablet, Rfl: 3 .  tamsulosin (FLOMAX) 0.4 MG CAPS capsule, Take 1 capsule (0.4 mg total) by mouth daily., Disp: 90 capsule, Rfl: 0 .  Tiotropium Bromide Monohydrate (SPIRIVA RESPIMAT) 2.5 MCG/ACT AERS, 2 puffs each am, Disp: 4 g, Rfl: 11 .  Tiotropium Bromide Monohydrate (SPIRIVA RESPIMAT) 2.5 MCG/ACT AERS, Inhale 2 puffs into the lungs daily., Disp: 4 g, Rfl: 0  EXAM:  VITALS per patient if applicable:  GENERAL: alert, oriented, appears well and in no acute distress  HEENT: atraumatic, conjunttiva clear, no obvious abnormalities on inspection of external nose and ears  NECK: normal movements of the head and neck  LUNGS: on inspection no signs of respiratory distress, breathing rate appears normal, no obvious gross SOB,  gasping or wheezing  CV: no obvious cyanosis  MS: moves all visible extremities without noticeable abnormality  PSYCH/NEURO: pleasant and cooperative, no obvious depression or anxiety, speech and thought processing grossly intact  ASSESSMENT AND PLAN:  Discussed the following assessment and plan:  1. Encounter for power mobility device assessment -I do think this patient would be an excellent candidate for a power wheelchair.  By having a power wheelchair he will be able to perform his ADLs and will have less symptomatic breathlessness throughout the day.  Having a power wheelchair also  provide a sense of wellbeing as he will be able to enjoy being outside and not stuck in the house all day.  I feel as though this patient has the mental capacity to operate a power wheelchair safely and he is willing to use it in the home. - DME Wheelchair electric  - Due to shortness of breath with ambulation even short distances as well as phonic left hip pain that causes gait instability I do not think a cane or a walker would by be ideal although this would give the patient some stability it would do nothing to help correct dyspnea on exertion.  - This patient would not be an ideal candidate to use a scooter either it would be difficult for him to transfer on and off the scooter due to left hip pain, it would be difficult using a scooter due to lack of postural stability, and it would make his shortness of breath worse to exertion when using the scooter.     I discussed the assessment and treatment plan with the patient. The patient was provided an opportunity to ask questions and all were answered. The patient agreed with the plan and demonstrated an understanding of the instructions.   The patient was advised to call back or seek an in-person evaluation if the symptoms worsen or if the condition fails to improve as anticipated.   [name], NP

## 2019-11-29 ENCOUNTER — Other Ambulatory Visit: Payer: Self-pay | Admitting: Adult Health

## 2019-11-29 NOTE — Telephone Encounter (Signed)
Ok to refill 90 +1  

## 2019-12-13 DIAGNOSIS — H2512 Age-related nuclear cataract, left eye: Secondary | ICD-10-CM | POA: Diagnosis not present

## 2019-12-13 DIAGNOSIS — D3132 Benign neoplasm of left choroid: Secondary | ICD-10-CM | POA: Diagnosis not present

## 2019-12-13 DIAGNOSIS — H2511 Age-related nuclear cataract, right eye: Secondary | ICD-10-CM | POA: Diagnosis not present

## 2019-12-27 ENCOUNTER — Other Ambulatory Visit: Payer: Self-pay | Admitting: Internal Medicine

## 2019-12-27 DIAGNOSIS — J449 Chronic obstructive pulmonary disease, unspecified: Secondary | ICD-10-CM

## 2020-01-03 NOTE — Progress Notes (Signed)
Cardiology Office Note:    Date:  01/04/2020   ID:  Chase Lloyd, DOB Aug 12, 1956, MRN [id number]  PCP:  [name], NP  Cardiologist:  Sinclair Grooms, MD   Referring MD: [name], NP   Chief Complaint  Patient presents with  . Coronary Artery Disease  . Shortness of Breath    History of Present Illness:    Chase Lloyd is a 64 y.o. male with a hx of shortness of breath and recent diagnosis of acute systolic heart failure, COPDGold 3, and CA calcification/LM disease on FFRCTA (04/2019). Cath suggested better to treat medically.  He had an episode of poorly described chest discomfort, shortness of breath, and fatigue in September.  Had an emergency room evaluation and no cardiac abnormalities were identified.  The delta troponin was unremarkable.  The EKG was nonischemic.  He is wearing a mask.  He is using oxygen continuously and much more than when seen before.  He denies nitroglycerin use.  There is trace bilateral ankle edema.  No palpitations.  No nitroglycerin use is required.  He denies orthopnea.  Has a chronic cough in the mornings.  Difficult to raise the phlegm.  Past Medical History:  Diagnosis Date  . Back pain   . COPD (chronic obstructive pulmonary disease) ([place])   . DDD (degenerative disc disease), lumbar   . Depression   . Emphysema of lung ([place])   . Empyema ([place])    2016  (was in [place])  . GERD (gastroesophageal reflux disease)   . Hearing loss of right ear    started 2001.     microphone in right ear ,  and hearing aid in left  . Hodgkin's disease ([place]) 1999  . Hypotension   . Meniere's disease   . Pneumonia   . Tinnitus     Past Surgical History:  Procedure Laterality Date  . BACK SURGERY    . CERVICAL FUSION  2016  . ELBOW SURGERY    . EYE SURGERY    . IR THORACENTESIS ASP PLEURAL SPACE W/IMG GUIDE  10/08/2018  . LAMINECTOMY  1999  . lower back surgery    . RIGHT/LEFT HEART CATH AND CORONARY ANGIOGRAPHY N/A 04/29/2019   Procedure: RIGHT/LEFT HEART CATH AND CORONARY ANGIOGRAPHY;  Surgeon: Troy Sine, MD;  Location: [facility];  Service: Cardiovascular;  Laterality: N/A;  . TONSILLECTOMY      Current Medications: Current Meds  Medication Sig  . albuterol (PROVENTIL) (2.5 MG/3ML) 0.083% nebulizer solution Take 3 mLs (2.5 mg total) by nebulization every 4 (four) hours as needed for wheezing or shortness of breath.  Marland Kitchen albuterol (VENTOLIN HFA) 108 (90 Base) MCG/ACT inhaler Inhale 2 puffs into the lungs every 6 (six) hours as needed for wheezing or shortness of breath.  Marland Kitchen aspirin EC 81 MG tablet Take 81 mg by mouth daily.  . furosemide (LASIX) 20 MG tablet Take 1 tablet (20 mg total) by mouth daily.  Marland Kitchen ibuprofen (ADVIL) 800 MG tablet TAKE 1 TABLET BY MOUTH EVERY 8 HOURS AS NEEDED  . isosorbide mononitrate (IMDUR) 30 MG 24 hr tablet Take 1 tablet (30 mg total) by mouth daily.  . metoprolol succinate (TOPROL-XL) 25 MG 24 hr tablet Take 1 tablet (25 mg total) by mouth daily.  Marland Kitchen omeprazole (PRILOSEC) 40 MG capsule Take 1 capsule (40 mg total) by mouth 2 (two) times daily.  . OXYGEN 2LPM AS NEEDED PER PT  . rosuvastatin (CRESTOR) 10 MG tablet Take  1 tablet (10 mg total) by mouth daily.  . SYMBICORT 160-4.5 MCG/ACT inhaler Inhale 2 puffs by mouth twice daily  . tamsulosin (FLOMAX) 0.4 MG CAPS capsule Take 1 capsule (0.4 mg total) by mouth daily.  . Tiotropium Bromide Monohydrate (SPIRIVA RESPIMAT) 2.5 MCG/ACT AERS Inhale 2 puffs into the lungs daily.     Allergies:   Patient has no known allergies.   Social History   Socioeconomic History  . Marital status: Married    Spouse name: Not on file  . Number of children: Not on file  . Years of education: Not on file  . Highest education level: Not on file  Occupational History  . Occupation: Retired  Tobacco Use  . Smoking status: Former Smoker    Packs/day: 1.50    Years: 47.00    Pack years: 70.50    Types: Cigarettes    Quit date: 06/13/2018     Years since quitting: 1.5  . Smokeless tobacco: Never Used  Substance and Sexual Activity  . Alcohol use: Yes    Comment: 24 cans in a week  . Drug use: No  . Sexual activity: Not on file  Other Topics Concern  . Not on file  Social History Narrative   Retied - worked as Conservation officer, nature Strain:   . Difficulty of Paying Living Expenses: Not on file  Food Insecurity:   . Worried About Charity fundraiser in the Last Year: Not on file  . Ran Out of Food in the Last Year: Not on file  Transportation Needs:   . Lack of Transportation (Medical): Not on file  . Lack of Transportation (Non-Medical): Not on file  Physical Activity:   . Days of Exercise per Week: Not on file  . Minutes of Exercise per Session: Not on file  Stress:   . Feeling of Stress : Not on file  Social Connections:   . Frequency of Communication with Friends and Family: Not on file  . Frequency of Social Gatherings with Friends and Family: Not on file  . Attends Religious Services: Not on file  . Active Member of Clubs or Organizations: Not on file  . Attends Archivist Meetings: Not on file  . Marital Status: Not on file     Family History: The patient's family history includes Diabetes in his father; Hypertension in his father; Melanoma in his brother; Testicular cancer in his brother.  ROS:   Please see the history of present illness.    He is a vaccine nonbeliever.  Despite having very high risk if he is Covid states that he will take his chances.  All other systems reviewed and are negative.  EKGs/Labs/Other Studies Reviewed:    The following studies were reviewed today: Cardiac catheterization April 29, 2019: Diagnostic Dominance: Right     EKG:  EKG performed in September was without evidence of ischemia.  Normal sinus rhythm.  Recent Labs: 07/19/2019: Pro B Natriuretic peptide (BNP) 108.0; TSH 6.36 07/30/2019: ALT 32; BUN 10;  Creatinine, Ser 1.24; Hemoglobin 11.3; Platelets 107; Potassium 3.6; Sodium 139  Recent Lipid Panel    Component Value Date/Time   CHOL 136 07/01/2019 0741   TRIG 116 07/01/2019 0741   HDL 47 07/01/2019 0741   CHOLHDL 2.9 07/01/2019 0741   CHOLHDL 3 05/05/2018 0803   VLDL 30.6 05/05/2018 0803   LDLCALC 66 07/01/2019 0741    Physical Exam:  VS:  BP (!) 144/56   Pulse 81   Ht 5\' 10"  (1.778 m)   Wt 201 lb 6.4 oz (91.4 kg)   SpO2 94%   BMI 28.90 kg/m     Wt Readings from Last 3 Encounters:  01/04/20 201 lb 6.4 oz (91.4 kg)  11/08/19 205 lb 3.2 oz (93.1 kg)  09/02/19 200 lb (90.7 kg)     GEN: Chronically ill appearing. No acute distress HEENT: Normal NECK: No JVD. LYMPHATICS: No lymphadenopathy CARDIAC:  RRR without murmur, gallop, or edema. VASCULAR:  Normal Pulses. No bruits. RESPIRATORY:  Clear to auscultation without rales, wheezing or rhonchi  ABDOMEN: Soft, non-tender, non-distended, No pulsatile mass, MUSCULOSKELETAL: No deformity  SKIN: Warm and dry NEUROLOGIC:  Alert and oriented x 3 PSYCHIATRIC:  Normal affect   ASSESSMENT:    1. Coronary artery disease involving native coronary artery of native heart with other form of angina pectoris ([place])   2. Essential hypertension   3. Intermittent claudication ([place])   4. COPD with acute exacerbation ([place])   5. Hyperlipidemia with target LDL less than 70   6. Educated about COVID-19 virus infection    PLAN:    In order of problems listed above:  1. Secondary prevention discussed.  Limited by poor conditioning and chronic oxygen requirement.  Unable to exercise. 2. Adequate control 3. No complaints 4. Chronic O2 therapy.  Not smoking. 5. LDL target less than 70.  64 in August 2020. 6. Refuses to consider the vaccine.  Only wearing his mask today because it is a requirement to get in the office.  Overall education and awareness concerning secondary risk prevention was discussed in detail: LDL less than 70,  hemoglobin A1c less than 7, blood pressure target less than 130/80 mmHg, >150 minutes of moderate aerobic activity per week, avoidance of smoking, weight control (via diet and exercise), and continued surveillance/management of/for obstructive sleep apnea.    Medication Adjustments/Labs and Tests Ordered: Current medicines are reviewed at length with the patient today.  Concerns regarding medicines are outlined above.  No orders of the defined types were placed in this encounter.  No orders of the defined types were placed in this encounter.   Patient Instructions  Medication Instructions:  Your physician recommends that you continue on your current medications as directed. Please refer to the Current Medication list given to you today.  *If you need a refill on your cardiac medications before your next appointment, please call your pharmacy*   Lab Work: None If you have labs (blood work) drawn today and your tests are completely normal, you will receive your results only by: Marland Kitchen MyChart Message (if you have MyChart) OR . A paper copy in the mail If you have any lab test that is abnormal or we need to change your treatment, we will call you to review the results.   Testing/Procedures: None   Follow-Up: At [facility], you and your health needs are our priority.  As part of our continuing mission to provide you with exceptional heart care, we have created designated Provider Care Teams.  These Care Teams include your primary Cardiologist (physician) and Advanced Practice Providers (APPs -  Physician Assistants and Nurse Practitioners) who all work together to provide you with the care you need, when you need it.  We recommend signing up for the patient portal called "MyChart".  Sign up information is provided on this After Visit Summary.  MyChart is used to connect with patients for Virtual Visits (  Telemedicine).  Patients are able to view lab/test results, encounter notes, upcoming  appointments, etc.  Non-urgent messages can be sent to your provider as well.   To learn more about what you can do with MyChart, go to [email or web address].    Your next appointment:   12 month(s)  The format for your next appointment:   In Person  Provider:   You may see Sinclair Grooms, MD or one of the following Advanced Practice Providers on your designated Care Team:    Truitt Merle, NP  Cecilie Kicks, NP  Kathyrn Drown, NP    Other Instructions      Signed, Sinclair Grooms, MD  01/04/2020 2:04 PM    [place]

## 2020-01-04 ENCOUNTER — Other Ambulatory Visit: Payer: Self-pay

## 2020-01-04 ENCOUNTER — Ambulatory Visit (INDEPENDENT_AMBULATORY_CARE_PROVIDER_SITE_OTHER): Payer: Medicare Other | Admitting: Interventional Cardiology

## 2020-01-04 ENCOUNTER — Encounter: Payer: Self-pay | Admitting: Interventional Cardiology

## 2020-01-04 VITALS — BP 144/56 | HR 81 | Ht 70.0 in | Wt 201.4 lb

## 2020-01-04 DIAGNOSIS — E785 Hyperlipidemia, unspecified: Secondary | ICD-10-CM | POA: Diagnosis not present

## 2020-01-04 DIAGNOSIS — J441 Chronic obstructive pulmonary disease with (acute) exacerbation: Secondary | ICD-10-CM | POA: Diagnosis not present

## 2020-01-04 DIAGNOSIS — Z7189 Other specified counseling: Secondary | ICD-10-CM | POA: Diagnosis not present

## 2020-01-04 DIAGNOSIS — I739 Peripheral vascular disease, unspecified: Secondary | ICD-10-CM | POA: Diagnosis not present

## 2020-01-04 DIAGNOSIS — I25118 Atherosclerotic heart disease of native coronary artery with other forms of angina pectoris: Secondary | ICD-10-CM | POA: Diagnosis not present

## 2020-01-04 DIAGNOSIS — I10 Essential (primary) hypertension: Secondary | ICD-10-CM | POA: Diagnosis not present

## 2020-01-04 NOTE — Patient Instructions (Addendum)

## 2020-01-25 ENCOUNTER — Other Ambulatory Visit: Payer: Self-pay | Admitting: Physician Assistant

## 2020-02-02 ENCOUNTER — Telehealth: Payer: Self-pay | Admitting: Adult Health

## 2020-02-02 NOTE — Progress Notes (Signed)
  Chronic Care Management   Outreach Note  02/02/2020 Name: Chase Lloyd MRN: [id number] DOB: August 24, 1956  Referred by: [name], NP Reason for referral : No chief complaint on file.   An unsuccessful telephone outreach was attempted today. The patient was referred to the pharmacist for assistance with care management and care coordination.   Follow Up Plan:   Raynicia Dukes [employer] Scheduler

## 2020-02-17 ENCOUNTER — Other Ambulatory Visit: Payer: Self-pay | Admitting: Adult Health

## 2020-02-29 ENCOUNTER — Other Ambulatory Visit: Payer: Self-pay

## 2020-02-29 ENCOUNTER — Encounter: Payer: Self-pay | Admitting: Adult Health

## 2020-02-29 ENCOUNTER — Telehealth (INDEPENDENT_AMBULATORY_CARE_PROVIDER_SITE_OTHER): Payer: Medicare Other | Admitting: Adult Health

## 2020-02-29 VITALS — Wt 200.0 lb

## 2020-02-29 DIAGNOSIS — Z7689 Persons encountering health services in other specified circumstances: Secondary | ICD-10-CM | POA: Diagnosis not present

## 2020-02-29 DIAGNOSIS — I25118 Atherosclerotic heart disease of native coronary artery with other forms of angina pectoris: Secondary | ICD-10-CM | POA: Diagnosis not present

## 2020-02-29 NOTE — Progress Notes (Signed)
Virtual Visit via Video Note  I connected with Chase Lloyd  on 02/29/20 at  3:30 PM EDT by a video enabled telemedicine application and verified that I am speaking with the correct person using two identifiers.  Location patient: home Location provider:work or home office Persons participating in the virtual visit: patient, provider  I discussed the limitations of evaluation and management by telemedicine and the availability of in person appointments. The patient expressed understanding and agreed to proceed.   HPI: This is a 64 year old male who is being evaluated for mobility assessment for power wheelchair.  He has a history of COPD and is on continuous home oxygen.  It has been about 3 years since he could do everything that he really wanted to do.  Unfortunately he has declined significantly in the last 3 years.  He had complications after a back surgery in September 2019 and was hospitalized with pneumonia.  He reports that over the last 6 months he has worsened more and has difficulties with his ADLs.  He is having to constantly wear 2 L of oxygen via nasal cannula around-the-clock.  He is on Spiriva and Symbicort but still feels short of breath most of the time throughout the day.  He reports that without oxygen his oxygen saturation at rest is 89% and he desats into the low 80s with exertion.  While on oxygen therapy his oxygen saturations at rest are 95% and they drop into the mid 80s with exertion.  Unfortunately he also has avascular necrosis of the left hip which leaves his mobility limited.  At rest his pain is low at a 2 or 3 out of 10 but with exertion his pain increases to 8 out of 10.  He is able to walk less than 10 feet before he becomes winded or is in too much pain that he can no longer ambulate.  He does feel unsteady with ambulation due to chronic right hip pain as well as a feeling of weakness in bilateral legs.  He does have a wheelchair at home but is unable to push  himself due to loss of pulmonary function and his wife is unable to push him due to an injury.  He is interested in having a power wheelchair to assist him in his home in order to perform ADLs such as cooking, bathing, using the toilet, dressing, as well as getting out of the house more often so that he can enjoy certain aspects of his life.  ROS: See pertinent positives and negatives per HPI.  Past Medical History:  Diagnosis Date  . Back pain   . COPD (chronic obstructive pulmonary disease) ([place])   . DDD (degenerative disc disease), lumbar   . Depression   . Emphysema of lung ([place])   . Empyema ([place])    2016  (was in [place])  . GERD (gastroesophageal reflux disease)   . Hearing loss of right ear    started 2001.     microphone in right ear ,  and hearing aid in left  . Hodgkin's disease ([place]) 1999  . Hypotension   . Meniere's disease   . Pneumonia   . Tinnitus     Past Surgical History:  Procedure Laterality Date  . BACK SURGERY    . CERVICAL FUSION  2016  . ELBOW SURGERY    . EYE SURGERY    . IR THORACENTESIS ASP PLEURAL SPACE W/IMG GUIDE  10/08/2018  . LAMINECTOMY  1999  . lower back surgery    .  RIGHT/LEFT HEART CATH AND CORONARY ANGIOGRAPHY N/A 04/29/2019   Procedure: RIGHT/LEFT HEART CATH AND CORONARY ANGIOGRAPHY;  Surgeon: Troy Sine, MD;  Location: [facility];  Service: Cardiovascular;  Laterality: N/A;  . TONSILLECTOMY      Family History  Problem Relation Age of Onset  . Hypertension Father   . Diabetes Father   . Testicular cancer Brother   . Melanoma Brother        Current Outpatient Medications:  .  albuterol (PROVENTIL) (2.5 MG/3ML) 0.083% nebulizer solution, Take 3 mLs (2.5 mg total) by nebulization every 4 (four) hours as needed for wheezing or shortness of breath., Disp: 75 mL, Rfl: 12 .  albuterol (VENTOLIN HFA) 108 (90 Base) MCG/ACT inhaler, Inhale 2 puffs into the lungs every 6 (six) hours as needed for wheezing or shortness of  breath., Disp: 18 g, Rfl: 11 .  aspirin EC 81 MG tablet, Take 81 mg by mouth daily., Disp: , Rfl:  .  ibuprofen (ADVIL) 800 MG tablet, TAKE 1 TABLET BY MOUTH EVERY 8 HOURS AS NEEDED, Disp: 90 tablet, Rfl: 1 .  isosorbide mononitrate (IMDUR) 30 MG 24 hr tablet, Take 1 tablet by mouth once daily, Disp: 90 tablet, Rfl: 3 .  metoprolol succinate (TOPROL-XL) 25 MG 24 hr tablet, Take 1 tablet (25 mg total) by mouth daily., Disp: 90 tablet, Rfl: 3 .  omeprazole (PRILOSEC) 40 MG capsule, Take 1 capsule (40 mg total) by mouth 2 (two) times daily., Disp: 60 capsule, Rfl: 11 .  OXYGEN, 2LPM AS NEEDED PER PT, Disp: , Rfl:  .  SYMBICORT 160-4.5 MCG/ACT inhaler, Inhale 2 puffs by mouth twice daily, Disp: 11 g, Rfl: 0 .  tamsulosin (FLOMAX) 0.4 MG CAPS capsule, Take 1 capsule (0.4 mg total) by mouth daily., Disp: 90 capsule, Rfl: 0 .  Tiotropium Bromide Monohydrate (SPIRIVA RESPIMAT) 2.5 MCG/ACT AERS, Inhale 2 puffs into the lungs daily., Disp: 4 g, Rfl: 0 .  furosemide (LASIX) 20 MG tablet, Take 1 tablet (20 mg total) by mouth daily., Disp: 30 tablet, Rfl: 11 .  rosuvastatin (CRESTOR) 10 MG tablet, Take 1 tablet (10 mg total) by mouth daily., Disp: 90 tablet, Rfl: 3  EXAM:  VITALS per patient if applicable:  GENERAL: alert, oriented, appears well and in no acute distress  HEENT: atraumatic, conjunttiva clear, no obvious abnormalities on inspection of external nose and ears  NECK: normal movements of the head and neck  LUNGS: on inspection he is wearing his nasal canula.  CV: no obvious cyanosis  MS: moves all visible extremities without noticeable abnormality  PSYCH/NEURO: pleasant and cooperative, no obvious depression or anxiety, speech and thought processing grossly intact  ASSESSMENT AND PLAN:  Discussed the following assessment and plan:  1. Encounter for power mobility device assessment I think this patient would be an excellent candidate for a power wheelchair. By having a power  wheelchair he will be able to perform his ADLs and will have less symptomatic shortness of breath throughout the day.  Having a power wheelchair also provides a sense of wellbeing as he will be able to enjoy being outside and not stuck in the house all day. I feel as  though this patient has the mental capacity to operate a power wheelchair safely and he is willing to use it in his home.  Due to shortness of breath with ambulation even short distances as well as chronic left hip pain that causes gait instability, I do not think a cane or a  walker would be ideal, although this would give the patient some extra stability,it would not do anything to help correct shortness of breath with exertion that is caused by COPD and he would continue to have desaturation of his oxygen levels to unsafe levels while propelling himself with these manual devices.  Unfortunately due to his COPD he is not a surgical candidate for left hip replacement from avascular necrosis as he would likely not survive the surgery.  Patient does have upper extremity and lower extremity weakness at baseline but I am unable to thoroughly test his weakness due to this being a telehealth appointment  This patient would not be an ideal candidate to use a scooter either as it would be difficult for him to transfer on and off the scooter due to left hip pain.  It would be difficult using a scooter due to lack of postural stability and it would make his shortness of breath worse while exerting himself to propel the scooter   I discussed the assessment and treatment plan with the patient. The patient was provided an opportunity to ask questions and all were answered. The patient agreed with the plan and demonstrated an understanding of the instructions.   The patient was advised to call back or seek an in-person evaluation if the symptoms worsen or if the condition fails to improve as anticipated.  - DME Wheelchair electric   [name], NP

## 2020-04-22 ENCOUNTER — Other Ambulatory Visit: Payer: Self-pay | Admitting: Internal Medicine

## 2020-04-22 ENCOUNTER — Other Ambulatory Visit: Payer: Self-pay | Admitting: Adult Health

## 2020-04-22 DIAGNOSIS — J449 Chronic obstructive pulmonary disease, unspecified: Secondary | ICD-10-CM

## 2020-04-24 NOTE — Telephone Encounter (Signed)
DENIED.  COMES FROM PULMONOLOGY.

## 2020-04-25 ENCOUNTER — Other Ambulatory Visit: Payer: Self-pay

## 2020-04-25 ENCOUNTER — Encounter: Payer: Self-pay | Admitting: Primary Care

## 2020-04-25 ENCOUNTER — Ambulatory Visit (INDEPENDENT_AMBULATORY_CARE_PROVIDER_SITE_OTHER): Payer: Medicare Other | Admitting: Primary Care

## 2020-04-25 VITALS — BP 132/82 | HR 71 | Temp 97.3°F | Ht 72.0 in | Wt 197.2 lb

## 2020-04-25 DIAGNOSIS — J449 Chronic obstructive pulmonary disease, unspecified: Secondary | ICD-10-CM

## 2020-04-25 DIAGNOSIS — J9611 Chronic respiratory failure with hypoxia: Secondary | ICD-10-CM | POA: Diagnosis not present

## 2020-04-25 DIAGNOSIS — J309 Allergic rhinitis, unspecified: Secondary | ICD-10-CM

## 2020-04-25 DIAGNOSIS — Z87891 Personal history of nicotine dependence: Secondary | ICD-10-CM | POA: Diagnosis not present

## 2020-04-25 MED ORDER — FLUTICASONE PROPIONATE 50 MCG/ACT NA SUSP: 1.0000 | Freq: Every day | NASAL | 2 refills | Status: DC

## 2020-04-25 MED ORDER — AZELASTINE HCL 0.1 % NA SOLN: 1.0000 | Freq: Two times a day (BID) | NASAL | 2 refills | Status: DC

## 2020-04-25 NOTE — Patient Instructions (Addendum)
Referral: Lung cancer screening program re: former smoker (70 pack year hx)  Rx: Fluticasone nasal spray 1 spray per nostril once daily [place] nasal spray 1 spray per nostril twice daily  Recommendations: Start nasal sprays as prescribed Take Mucinex 1 tablet twice daily with water for chest congestion Continue Symbicort 2 puffs twice daily (rinse mouth after use) Continue Spiriva 2 puffs once daily in the morning Recommend you use incentive spirometer to encourage deep breathing exercises 10 breaths x3 times a day  Follow-up: 3 to 4 months with Dr. Shearon Stalls sooner if needed   Chronic Obstructive Pulmonary Disease Chronic obstructive pulmonary disease (COPD) is a [place]-term (chronic) lung problem. When you have COPD, it is hard for air to get in and out of your lungs. Usually the condition gets worse over time, and your lungs will never return to normal. There are things you can do to keep yourself as healthy as possible.  Your doctor may treat your condition with: ? Medicines. ? Oxygen. ? Lung surgery.  Your doctor may also recommend: ? Rehabilitation. This includes steps to make your body work better. It may involve a team of specialists. ? Quitting smoking, if you smoke. ? Exercise and changes to your diet. ? Comfort measures (palliative care). Follow these instructions at home: Medicines  Take over-the-counter and prescription medicines only as told by your doctor.  Talk to your doctor before taking any cough or allergy medicines. You may need to avoid medicines that cause your lungs to be dry. Lifestyle  If you smoke, stop. Smoking makes the problem worse. If you need help quitting, ask your doctor.  Avoid being around things that make your breathing worse. This may include smoke, chemicals, and fumes.  Stay active, but remember to rest as well.  Learn and use tips on how to relax.  Make sure you get enough sleep. Most adults need at least 7 hours of sleep every  night.  Eat healthy foods. Eat smaller meals more often. Rest before meals. Controlled breathing Learn and use tips on how to control your breathing as told by your doctor. Try:  Breathing in (inhaling) through your nose for 1 second. Then, pucker your lips and breath out (exhale) through your lips for 2 seconds.  Putting one hand on your belly (abdomen). Breathe in slowly through your nose for 1 second. Your hand on your belly should move out. Pucker your lips and breathe out slowly through your lips. Your hand on your belly should move in as you breathe out.  Controlled coughing Learn and use controlled coughing to clear mucus from your lungs. Follow these steps: 1. Lean your head a little forward. 2. Breathe in deeply. 3. Try to hold your breath for 3 seconds. 4. Keep your mouth slightly open while coughing 2 times. 5. Spit any mucus out into a tissue. 6. Rest and do the steps again 1 or 2 times as needed. General instructions  Make sure you get all the shots (vaccines) that your doctor recommends. Ask your doctor about a flu shot and a pneumonia shot.  Use oxygen therapy and pulmonary rehabilitation if told by your doctor. If you need home oxygen therapy, ask your doctor if you should buy a tool to measure your oxygen level (oximeter).  Make a COPD action plan with your doctor. This helps you to know what to do if you feel worse than usual.  Manage any other conditions you have as told by your doctor.  Avoid going outside when  it is very hot, cold, or humid.  Avoid people who have a sickness you can catch (contagious).  Keep all follow-up visits as told by your doctor. This is important. Contact a doctor if:  You cough up more mucus than usual.  There is a change in the color or thickness of the mucus.  It is harder to breathe than usual.  Your breathing is faster than usual.  You have trouble sleeping.  You need to use your medicines more often than usual.  You  have trouble doing your normal activities such as getting dressed or walking around the house. Get help right away if:  You have shortness of breath while resting.  You have shortness of breath that stops you from: ? Being able to talk. ? Doing normal activities.  Your chest hurts for longer than 5 minutes.  Your skin color is more blue than usual.  Your pulse oximeter shows that you have low oxygen for longer than 5 minutes.  You have a fever.  You feel too tired to breathe normally. Summary  Chronic obstructive pulmonary disease (COPD) is a [place]-term lung problem.  The way your lungs work will never return to normal. Usually the condition gets worse over time. There are things you can do to keep yourself as healthy as possible.  Take over-the-counter and prescription medicines only as told by your doctor.  If you smoke, stop. Smoking makes the problem worse. This information is not intended to replace advice given to you by your health care provider. Make sure you discuss any questions you have with your health care provider. Document Revised: 10/02/2017 Document Reviewed: 11/24/2016 Elsevier Patient Education  2020 [employer].

## 2020-04-25 NOTE — Assessment & Plan Note (Addendum)
-   Stable interval, no recent exacerbations. He has moderate DOE which is his baseline and upper airway congestion.  - Continue Symbicort 160 2 puffs twice daily + Spiriva daily (no preceived improvement in the past from [place]) - Recommend patient take mucinex 600mg  twice daily for chest congestion and use incentive spirometer 10 breaths x3 times a day - Unable to participate in pulmonary rehab d/t chronic left hip pain and poor mobility  - Referring to lung cancer screening program re: former smoker >70 pack year hx - FU in 3-4 months with Dr. Shearon Stalls

## 2020-04-25 NOTE — Progress Notes (Signed)
@Patient  ID: Chase Lloyd, male    DOB: [birth year], 64 y.o.   MRN: [id number]  Chief Complaint  Patient presents with   Follow-up    COPD, doing good    Referring provider: [name], NP  HPI: 64 year old male, former smoker quit approx 2 years ago (70-pack-year history).  Past medical history significant for states severe COPD with emphysema, chronic respiratory failure, hypertension, coronary artery disease, GERD.  Patient of Dr. Shearon Stalls,   04/25/2020 Patient presents today for 4-5 month follow-up. Accompanied by his wife. He is doing well, he has no acute complaints. Reports heat and humidity worsen his breathing. He continues Symbicort 160 and Spiriva. He remains on 2L oxygen during the day, he will occasional turn POC up to 4L when exerting himself. He denies acute cough but states that he gets phlegm stuck in the back of his throat which is hard for him to get out. He is not currently taking anything allergy medication, nasal sprays or expectorants. He is unable to do pulmonary rehab d.t left hip and states that he can not do much.     Data Reviewed: Imaging: I have personally reviewed the CT angio performed September 2020 which demonstrates severe centrilobular upper lobe predominant emphysema, no focal pneumonia, no PE.  PFTs:  PFT Results Latest Ref Rng & Units 10/22/2018 07/30/2017  FVC-Pre L 2.42 2.45  FVC-Predicted Pre % 50 50  FVC-Post L 2.87 2.90  FVC-Predicted Post % 59 59  Pre FEV1/FVC % % 46 46  Post FEV1/FCV % % 44 48  FEV1-Pre L 1.10 1.13  FEV1-Predicted Pre % 30 31  FEV1-Post L 1.26 1.39  DLCO UNC% % 37 39  DLCO COR %Predicted % 67 59  TLC L 5.86 -  TLC % Predicted % 81 -  RV % Predicted % 129 -    An alpha-1 antitrypsin serum value which was within the normal range  No Known Allergies  Immunization History  Administered Date(s) Administered   Fluad Quad(high Dose 65+) 09/02/2019   Influenza,inj,Quad PF,6+ Mos 12/27/2015, 10/11/2018    Pneumococcal Conjugate-13 12/26/2015   Td 01/01/2014   Zoster 08/04/2015    Past Medical History:  Diagnosis Date   Back pain    COPD (chronic obstructive pulmonary disease) (HCC)    DDD (degenerative disc disease), lumbar    Depression    Emphysema of lung ([place])    Empyema ([place])    2016  (was in [place])   GERD (gastroesophageal reflux disease)    Hearing loss of right ear    started 2001.     microphone in right ear ,  and hearing aid in left   Hodgkin's disease ([place]) 1999   Hypotension    Meniere's disease    Pneumonia    Tinnitus     Tobacco History: Social History   Tobacco Use  Smoking Status Former Smoker   Packs/day: 1.50   Years: 47.00   Pack years: 70.50   Types: Cigarettes   Quit date: 06/13/2018   Years since quitting: 1.8  Smokeless Tobacco Never Used   Counseling given: Not Answered   Outpatient Medications Prior to Visit  Medication Sig Dispense Refill   albuterol (PROVENTIL) (2.5 MG/3ML) 0.083% nebulizer solution Take 3 mLs (2.5 mg total) by nebulization every 4 (four) hours as needed for wheezing or shortness of breath. 75 mL 12   albuterol (VENTOLIN HFA) 108 (90 Base) MCG/ACT inhaler Inhale 2 puffs into the lungs every 6 (six)  hours as needed for wheezing or shortness of breath. 18 g 11   aspirin EC 81 MG tablet Take 81 mg by mouth daily.     ibuprofen (ADVIL) 800 MG tablet TAKE 1 TABLET BY MOUTH EVERY 8 HOURS AS NEEDED 90 tablet 1   isosorbide mononitrate (IMDUR) 30 MG 24 hr tablet Take 1 tablet by mouth once daily 90 tablet 3   metoprolol succinate (TOPROL-XL) 25 MG 24 hr tablet Take 1 tablet (25 mg total) by mouth daily. 90 tablet 3   omeprazole (PRILOSEC) 40 MG capsule Take 1 capsule (40 mg total) by mouth 2 (two) times daily. 60 capsule 11   OXYGEN 2LPM AS NEEDED PER PT     SYMBICORT 160-4.5 MCG/ACT inhaler Inhale 2 puffs by mouth twice daily 10.2 g 5   tamsulosin (FLOMAX) 0.4 MG CAPS capsule Take 1 capsule (0.4  mg total) by mouth daily. 90 capsule 0   Tiotropium Bromide Monohydrate (SPIRIVA RESPIMAT) 2.5 MCG/ACT AERS Inhale 2 puffs into the lungs daily. 4 g 0   furosemide (LASIX) 20 MG tablet Take 1 tablet (20 mg total) by mouth daily. 30 tablet 11   rosuvastatin (CRESTOR) 10 MG tablet Take 1 tablet (10 mg total) by mouth daily. 90 tablet 3   No facility-administered medications prior to visit.    Review of Systems  Review of Systems  HENT: Positive for postnasal drip.   Respiratory: Positive for cough and wheezing. Negative for chest tightness.        Dyspnea on exertion   Physical Exam  BP 132/82 (BP Location: Left Arm, Cuff Size: Normal)    Pulse 71    Temp (!) 97.3 F (36.3 C) (Oral)    Ht 6' (1.829 m)    Wt 197 lb 3.2 oz (89.4 kg)    SpO2 96%    BMI 26.75 kg/m  Physical Exam Constitutional:      Appearance: Normal appearance.  HENT:     Head: Normocephalic and atraumatic.     Mouth/Throat:     Mouth: Mucous membranes are moist.     Pharynx: Oropharynx is clear. No oropharyngeal exudate or posterior oropharyngeal erythema.  Cardiovascular:     Rate and Rhythm: Normal rate and regular rhythm.  Pulmonary:     Effort: Pulmonary effort is normal.     Breath sounds: Normal breath sounds.     Comments: Faint wheezing left lung. O2 96% on 2L POC  Musculoskeletal:     Comments: In WC  Skin:    General: Skin is warm and dry.  Neurological:     General: No focal deficit present.     Mental Status: He is alert and oriented to person, place, and time. Mental status is at baseline.  Psychiatric:        Mood and Affect: Mood normal.        Behavior: Behavior normal.        Thought Content: Thought content normal.      Lab Results:  CBC    Component Value Date/Time   WBC 5.2 07/30/2019 1259   RBC 3.33 (L) 07/30/2019 1259   HGB 11.3 (L) 07/30/2019 1259   HGB 13.4 04/25/2019 1632   HCT 33.5 (L) 07/30/2019 1259   HCT 39.2 04/25/2019 1632   PLT 107 (L) 07/30/2019 1259   PLT  154 04/25/2019 1632   MCV 100.6 (H) 07/30/2019 1259   MCV 95 04/25/2019 1632   MCH 33.9 07/30/2019 1259   MCHC 33.7 07/30/2019 1259  RDW 12.9 07/30/2019 1259   RDW 14.4 04/25/2019 1632   LYMPHSABS 0.6 (L) 07/30/2019 1259   MONOABS 0.4 07/30/2019 1259   EOSABS 0.0 07/30/2019 1259   BASOSABS 0.0 07/30/2019 1259    BMET    Component Value Date/Time   NA 139 07/30/2019 1259   NA 139 04/25/2019 1632   K 3.6 07/30/2019 1259   CL 101 07/30/2019 1259   CO2 29 07/30/2019 1259   GLUCOSE 89 07/30/2019 1259   BUN 10 07/30/2019 1259   BUN 11 04/25/2019 1632   CREATININE 1.24 07/30/2019 1259   CALCIUM 8.7 (L) 07/30/2019 1259   GFRNONAA >60 07/30/2019 1259   GFRAA >60 07/30/2019 1259    BNP    Component Value Date/Time   BNP 65.7 10/06/2018 2114    ProBNP    Component Value Date/Time   PROBNP 108.0 (H) 07/19/2019 1508    Imaging: No results found.   Assessment & Plan:   COPD GOLD III  - Stable interval, no recent exacerbations. He has moderate DOE which is his baseline and upper airway congestion.  - Continue Symbicort 160 2 puffs twice daily + Spiriva daily (no preceived improvement in the past from [place]) - Recommend patient take mucinex 600mg  twice daily for chest congestion and use incentive spirometer 10 breaths x3 times a day - Unable to participate in pulmonary rehab d/t chronic left hip pain and poor mobility  - Referring to lung cancer screening program re: former smoker >70 pack year hx - FU in 3-4 months with Dr. Shearon Stalls  Chronic respiratory failure with hypoxia ([place]) - Continues supplemental oxygen and benefits from use  - O2 96% on 2L POC  Allergic rhinitis - RX: adding fluticasone + Astelin nasal spray for PND symptoms    Martyn Ehrich, NP 04/25/2020

## 2020-04-25 NOTE — Assessment & Plan Note (Signed)
-   RX: adding fluticasone + Astelin nasal spray for PND symptoms

## 2020-04-25 NOTE — Assessment & Plan Note (Signed)
-   Continues supplemental oxygen and benefits from use  - O2 96% on 2L POC

## 2020-04-30 ENCOUNTER — Other Ambulatory Visit: Payer: Self-pay | Admitting: *Deleted

## 2020-04-30 DIAGNOSIS — Z87891 Personal history of nicotine dependence: Secondary | ICD-10-CM

## 2020-05-10 ENCOUNTER — Other Ambulatory Visit: Payer: Self-pay | Admitting: Adult Health

## 2020-05-15 ENCOUNTER — Telehealth: Payer: Self-pay | Admitting: Acute Care

## 2020-05-15 ENCOUNTER — Inpatient Hospital Stay: Admission: RE | Admit: 2020-05-15 | Payer: Medicare Other | Source: Ambulatory Visit

## 2020-05-15 ENCOUNTER — Ambulatory Visit
Admission: RE | Admit: 2020-05-15 | Discharge: 2020-05-15 | Disposition: A | Discharge status: Home / Self Care | Payer: Medicare Other | Source: Ambulatory Visit | Attending: Acute Care | Admitting: Acute Care

## 2020-05-15 DIAGNOSIS — Z87891 Personal history of nicotine dependence: Secondary | ICD-10-CM

## 2020-05-15 NOTE — Telephone Encounter (Signed)
IMPRESSION: 1. Lung-RADS 4B, suspicious. Two new indistinct nodular foci of consolidation, largest 16.6 mm in the left upper lobe, potentially inflammatory. Follow up low-dose chest CT without contrast in 3 months (please use the following order, "CT CHEST LCS NODULE FOLLOW-UP /O CM") is recommended. Additional imaging evaluation or consultation with Pulmonology or Thoracic Surgery recommended. 2. Chronic small dependent bilateral pleural effusions, decreased from prior screening chest CT. 3. Three-vessel coronary atherosclerosis. 4. Aortic Atherosclerosis (ICD10-I70.0) and Emphysema (ICD10-J43.9).  [facility] and spoke with Olivia Mackie in regards to the results of the CT and stated to her that we would send this to [facility]. Olivia Mackie verbalized understanding. Also routing this to [facility]

## 2020-05-18 NOTE — Progress Notes (Signed)
I have attempted to call the patient with the results of his low dose CT. There was no answer, and the VM was full, so I was unable to leave a message.  Langley Gauss, this scan was read as a Lung RADS 4 B indicates suspicious findings for which additional diagnostic testing and or tissue sampling is recommended.  The radiologist specified that the nodules appear to be inflammatory or infectious and suggested that we do a 3 month follow up CT. Icard agreed with this plan. Please place an order for 3 month follow up and we will try and call again Monday. Thanks so much

## 2020-05-22 NOTE — Progress Notes (Signed)
I have called the patient with the results of his low dose CT. I explained that his scan was read as a Lung RADS 4 B indicates suspicious findings for which additional diagnostic testing and or tissue sampling is recommended. The radiologist who read the scan felr this nodule represented an inflammatory change. I explained that I also had Dr. Valeta Harms review the scan, and he was in agreement with the radiologist that a 3 month follow up to assess stability of the nodule was the best plan. The patient states he has not been sick. He has not had a fever, there has been no change in his secretions.  We will scan the patient in 3 months. Langley Gauss, please place order for 3 month follow up mid October of 2021. Please fax results to PCP and let them know we have a follow up scheduled and that we will communicate the results of the follow up scan to them.  Thanks so much

## 2020-05-24 NOTE — Telephone Encounter (Signed)
Pt was made aware of the results by SG 7/20. Nothing further needed.

## 2020-05-28 ENCOUNTER — Other Ambulatory Visit: Payer: Self-pay | Admitting: *Deleted

## 2020-05-28 DIAGNOSIS — Z87891 Personal history of nicotine dependence: Secondary | ICD-10-CM

## 2020-06-11 ENCOUNTER — Encounter (INDEPENDENT_AMBULATORY_CARE_PROVIDER_SITE_OTHER): Payer: Self-pay | Admitting: Ophthalmology

## 2020-06-11 ENCOUNTER — Other Ambulatory Visit: Payer: Self-pay

## 2020-06-11 ENCOUNTER — Ambulatory Visit (INDEPENDENT_AMBULATORY_CARE_PROVIDER_SITE_OTHER): Payer: Medicare Other | Admitting: Ophthalmology

## 2020-06-11 DIAGNOSIS — D3132 Benign neoplasm of left choroid: Secondary | ICD-10-CM

## 2020-06-11 DIAGNOSIS — I25118 Atherosclerotic heart disease of native coronary artery with other forms of angina pectoris: Secondary | ICD-10-CM

## 2020-06-11 DIAGNOSIS — H2512 Age-related nuclear cataract, left eye: Secondary | ICD-10-CM | POA: Diagnosis not present

## 2020-06-11 DIAGNOSIS — H2511 Age-related nuclear cataract, right eye: Secondary | ICD-10-CM | POA: Diagnosis not present

## 2020-06-11 NOTE — Progress Notes (Signed)
06/11/2020     CHIEF COMPLAINT Patient presents for Retina Follow Up   HISTORY OF PRESENT ILLNESS: Chase Lloyd is a 64 y.o. male who presents to the clinic today for:   HPI    Retina Follow Up    Patient presents with  Other.  In both eyes.  Duration of 6 months.  Since onset it is stable.          Comments    6 month follow up - OCT OU Patient denies change in vision and overall has no complaints.        Last edited by Gerda Diss on 06/11/2020  1:02 PM. (History)      Referring physician: [name], NP [place],  [place] [address]  HISTORICAL INFORMATION:   Selected notes from the [facility]: No current outpatient medications on file. (Ophthalmic Drugs)   No current facility-administered medications for this visit. (Ophthalmic Drugs)   Current Outpatient Medications (Other)  Medication Sig  . albuterol (PROVENTIL) (2.5 MG/3ML) 0.083% nebulizer solution Take 3 mLs (2.5 mg total) by nebulization every 4 (four) hours as needed for wheezing or shortness of breath.  Marland Kitchen albuterol (VENTOLIN HFA) 108 (90 Base) MCG/ACT inhaler Inhale 2 puffs into the lungs every 6 (six) hours as needed for wheezing or shortness of breath.  Marland Kitchen aspirin EC 81 MG tablet Take 81 mg by mouth daily.  Marland Kitchen azelastine (ASTELIN) 0.1 % nasal spray Place 1 spray into both nostrils 2 (two) times daily. Use in each nostril as directed  . fluticasone (FLONASE) 50 MCG/ACT nasal spray Place 1 spray into both nostrils daily.  . furosemide (LASIX) 20 MG tablet Take 1 tablet (20 mg total) by mouth daily.  Marland Kitchen ibuprofen (ADVIL) 800 MG tablet TAKE 1 TABLET BY MOUTH EVERY 8 HOURS AS NEEDED  . isosorbide mononitrate (IMDUR) 30 MG 24 hr tablet Take 1 tablet by mouth once daily  . metoprolol succinate (TOPROL-XL) 25 MG 24 hr tablet Take 1 tablet (25 mg total) by mouth daily.  Marland Kitchen omeprazole (PRILOSEC) 40 MG capsule Take 1 capsule (40 mg total) by mouth 2 (two)  times daily.  . OXYGEN 2LPM AS NEEDED PER PT  . rosuvastatin (CRESTOR) 10 MG tablet Take 1 tablet (10 mg total) by mouth daily.  . SYMBICORT 160-4.5 MCG/ACT inhaler Inhale 2 puffs by mouth twice daily  . tamsulosin (FLOMAX) 0.4 MG CAPS capsule Take 1 capsule (0.4 mg total) by mouth daily.  . Tiotropium Bromide Monohydrate (SPIRIVA RESPIMAT) 2.5 MCG/ACT AERS Inhale 2 puffs into the lungs daily.   No current facility-administered medications for this visit. (Other)      REVIEW OF SYSTEMS:    ALLERGIES No Known Allergies  PAST MEDICAL HISTORY Past Medical History:  Diagnosis Date  . Back pain   . COPD (chronic obstructive pulmonary disease) ([place])   . DDD (degenerative disc disease), lumbar   . Depression   . Emphysema of lung ([place])   . Empyema ([place])    2016  (was in [place])  . GERD (gastroesophageal reflux disease)   . Hearing loss of right ear    started 2001.     microphone in right ear ,  and hearing aid in left  . Hodgkin's disease ([place]) 1999  . Hypotension   . Meniere's disease   . Pneumonia   . Tinnitus    Past Surgical History:  Procedure Laterality Date  .  BACK SURGERY    . CERVICAL FUSION  2016  . ELBOW SURGERY    . EYE SURGERY    . IR THORACENTESIS ASP PLEURAL SPACE W/IMG GUIDE  10/08/2018  . LAMINECTOMY  1999  . lower back surgery    . RIGHT/LEFT HEART CATH AND CORONARY ANGIOGRAPHY N/A 04/29/2019   Procedure: RIGHT/LEFT HEART CATH AND CORONARY ANGIOGRAPHY;  Surgeon: Troy Sine, MD;  Location: [facility];  Service: Cardiovascular;  Laterality: N/A;  . TONSILLECTOMY      FAMILY HISTORY Family History  Problem Relation Age of Onset  . Hypertension Father   . Diabetes Father   . Testicular cancer Brother   . Melanoma Brother     SOCIAL HISTORY Social History   Tobacco Use  . Smoking status: Former Smoker    Packs/day: 1.50    Years: 47.00    Pack years: 70.50    Types: Cigarettes    Quit date: 06/13/2018    Years since quitting:  1.9  . Smokeless tobacco: Never Used  Vaping Use  . Vaping Use: Never used  Substance Use Topics  . Alcohol use: Yes    Comment: 24 cans in a week  . Drug use: No         OPHTHALMIC EXAM:  Base Eye Exam    Visual Acuity (Snellen - Linear)      Right Left   Dist [place] 20/40+1 20/50-1   Dist ph [place] 20/30-1 20/30       Tonometry (Tonopen, 1:08 PM)      Right Left   Pressure 17 20       Pupils      Pupils Dark Light Shape React APD   Right PERRL 5 4 Round Slow None   Left PERRL 5 4 Round Slow None       Visual Fields (Counting fingers)      Left Right    Full Full       Extraocular Movement      Right Left    Full Full       Neuro/Psych    Oriented x3: Yes   Mood/Affect: Normal       Dilation    Both eyes: 1.0% Mydriacyl, 2.5% Phenylephrine @ 1:08 PM        Slit Lamp and Fundus Exam    External Exam      Right Left   External Normal Normal       Slit Lamp Exam      Right Left   Lids/Lashes Normal Normal   Conjunctiva/Sclera White and quiet White and quiet   Cornea Clear Clear   Anterior Chamber Deep and quiet Deep and quiet   Iris Round and reactive Round and reactive   Lens 2+ Nuclear sclerosis 2+ Nuclear sclerosis   Anterior Vitreous Normal Normal       Fundus Exam      Right Left   Posterior Vitreous Normal Normal   Disc Normal Normal   C/D Ratio 0.0 0.0   Macula Normal Normal   Vessels Normal Normal   Periphery Normal Small, 1.5 disc diameter flat irregularly pigmented choroidal nevus inferonasal to the optic nerve Julli there is 1 small reddish hue yet no apparent tumor lakes, abnormal vascularity, no subretinal fluid, no atrophy, no lipofuscin          IMAGING AND PROCEDURES  Imaging and Procedures for 06/11/20  Color Fundus Photography Optos - OU - Both Eyes  Right Eye Progression has been stable. Disc findings include normal observations. Macula : normal observations.   Left Eye Progression has been stable. Disc  findings include normal observations. Macula : normal observations. Vessels : normal observations.   Notes Choroidal nevus, flat inferonasal to the optic nerve with no interval change from February 2021.  OS.                ASSESSMENT/PLAN:  Choroidal nevus of left eye The nature of choroidal nevus was discussed with the patient and an informational form was offered.  Photo documentation was discussed with the patient.  Periodic follow-up may be needed for a lifetime. The patient's questions were answered. At minimum, annual exams will be needed if no signs of early growth.  No interval change today at 5-month interval.      ICD-10-CM   1. Choroidal nevus of left eye  D31.32 Color Fundus Photography Optos - OU - Both Eyes  2. Nuclear sclerotic cataract of left eye  H25.12   3. Nuclear sclerotic cataract of right eye  H25.11     1.  Examination today confirms no change in size or breadth of the flat nevus posterior pole left eye. Non- Pathologic small reddish hue centrally in the nevus, should be followed carefully and thus observe again in 6 months  2.  Dilate OS, color photos OU, B-scan OS next visit  3.  Ophthalmic Meds Ordered this visit:  No orders of the defined types were placed in this encounter.      Return in about 6 months (around 12/12/2020) for COLOR FP, dilate, OS, B-Scan U/S.  There are no Patient Instructions on file for this visit.   Explained the diagnoses, plan, and follow up with the patient and they expressed understanding.  Patient expressed understanding of the importance of proper follow up care.   Clent Demark Elton Catalano M.D. Diseases & Surgery of the Retina and Vitreous Retina & Diabetic [place] 06/11/20     Abbreviations: M myopia (nearsighted); A astigmatism; H hyperopia (farsighted); P presbyopia; Mrx spectacle prescription;  CTL contact lenses; OD right eye; OS left eye; OU both eyes  XT exotropia; ET esotropia; PEK punctate epithelial  keratitis; PEE punctate epithelial erosions; DES dry eye syndrome; MGD meibomian gland dysfunction; ATs artificial tears; PFAT's preservative free artificial tears; [place] nuclear sclerotic cataract; PSC posterior subcapsular cataract; ERM epi-retinal membrane; PVD posterior vitreous detachment; RD retinal detachment; DM diabetes mellitus; DR diabetic retinopathy; NPDR non-proliferative diabetic retinopathy; PDR proliferative diabetic retinopathy; CSME clinically significant macular edema; DME diabetic macular edema; dbh dot blot hemorrhages; CWS cotton wool spot; POAG primary open angle glaucoma; C/D cup-to-disc ratio; HVF humphrey visual field; GVF goldmann visual field; OCT optical coherence tomography; IOP intraocular pressure; BRVO Branch retinal vein occlusion; CRVO central retinal vein occlusion; CRAO central retinal artery occlusion; BRAO branch retinal artery occlusion; RT retinal tear; SB scleral buckle; PPV pars plana vitrectomy; VH Vitreous hemorrhage; PRP panretinal laser photocoagulation; IVK intravitreal kenalog; VMT vitreomacular traction; MH Macular hole;  NVD neovascularization of the disc; NVE neovascularization elsewhere; AREDS age related eye disease study; ARMD age related macular degeneration; POAG primary open angle glaucoma; EBMD epithelial/anterior basement membrane dystrophy; ACIOL anterior chamber intraocular lens; IOL intraocular lens; PCIOL posterior chamber intraocular lens; Phaco/IOL phacoemulsification with intraocular lens placement; [place] photorefractive keratectomy; LASIK laser assisted in situ keratomileusis; HTN hypertension; DM diabetes mellitus; COPD chronic obstructive pulmonary disease

## 2020-06-11 NOTE — Assessment & Plan Note (Signed)
The nature of choroidal nevus was discussed with the patient and an informational form was offered.  Photo documentation was discussed with the patient.  Periodic follow-up may be needed for a lifetime. The patient's questions were answered. At minimum, annual exams will be needed if no signs of early growth.  No interval change today at 55-month interval.

## 2020-07-20 ENCOUNTER — Encounter: Payer: Self-pay | Admitting: Internal Medicine

## 2020-07-20 ENCOUNTER — Telehealth (INDEPENDENT_AMBULATORY_CARE_PROVIDER_SITE_OTHER): Payer: Medicare Other | Admitting: Internal Medicine

## 2020-07-20 VITALS — Temp 98.7°F | Wt 200.0 lb

## 2020-07-20 DIAGNOSIS — J441 Chronic obstructive pulmonary disease with (acute) exacerbation: Secondary | ICD-10-CM

## 2020-07-20 MED ORDER — AZITHROMYCIN 500 MG PO TABS: 500.0000 mg | ORAL_TABLET | Freq: Every day | ORAL | 0 refills | Status: AC

## 2020-07-20 MED ORDER — PREDNISONE 10 MG (21) PO TBPK: ORAL_TABLET | ORAL | 0 refills | Status: DC

## 2020-07-20 NOTE — Progress Notes (Signed)
Virtual Visit via Telephone Note  I connected with Chase Lloyd on 07/20/20 at  2:30 PM EDT by telephone and verified that I am speaking with the correct person using two identifiers.   I discussed the limitations, risks, security and privacy concerns of performing an evaluation and management service by telephone and the availability of in person appointments. I also discussed with the patient that there may be a patient responsible charge related to this service. The patient expressed understanding and agreed to proceed.  Location patient: home Location provider: work office Participants present for the call: patient, provider Patient did not have a visit in the prior 7 days to address this/these issue(s).   History of Present Illness:  Since Sunday, 5 days prior to visit, he has been having a sore throat and a cough.  He states he has been producing large amounts of yellow/green phlegm.  He is oxygen dependent on 2 L, he feels a little more short of breath than usual.  His wife has mentioned that he is wheezing more as well.  He has had some runny nose but no headache, myalgias or fever.  No sick contacts, no recent travel.  He has not been vaccinated against Covid.  He does have a history of oxygen dependent COPD.   Observations/Objective: Patient sounds cheerful and well on the phone. I do not appreciate any increased work of breathing. Speech and thought processing are grossly intact. Patient reported vitals: None reported   Current Outpatient Medications:  .  albuterol (PROVENTIL) (2.5 MG/3ML) 0.083% nebulizer solution, Take 3 mLs (2.5 mg total) by nebulization every 4 (four) hours as needed for wheezing or shortness of breath., Disp: 75 mL, Rfl: 12 .  albuterol (VENTOLIN HFA) 108 (90 Base) MCG/ACT inhaler, Inhale 2 puffs into the lungs every 6 (six) hours as needed for wheezing or shortness of breath., Disp: 18 g, Rfl: 11 .  aspirin EC 81 MG tablet, Take 81 mg by mouth  daily., Disp: , Rfl:  .  azelastine (ASTELIN) 0.1 % nasal spray, Place 1 spray into both nostrils 2 (two) times daily. Use in each nostril as directed, Disp: 30 mL, Rfl: 2 .  fluticasone (FLONASE) 50 MCG/ACT nasal spray, Place 1 spray into both nostrils daily., Disp: 16 g, Rfl: 2 .  ibuprofen (ADVIL) 800 MG tablet, TAKE 1 TABLET BY MOUTH EVERY 8 HOURS AS NEEDED, Disp: 90 tablet, Rfl: 1 .  isosorbide mononitrate (IMDUR) 30 MG 24 hr tablet, Take 1 tablet by mouth once daily, Disp: 90 tablet, Rfl: 3 .  metoprolol succinate (TOPROL-XL) 25 MG 24 hr tablet, Take 1 tablet (25 mg total) by mouth daily., Disp: 90 tablet, Rfl: 3 .  omeprazole (PRILOSEC) 40 MG capsule, Take 1 capsule (40 mg total) by mouth 2 (two) times daily., Disp: 60 capsule, Rfl: 11 .  OXYGEN, 2LPM AS NEEDED PER PT, Disp: , Rfl:  .  SYMBICORT 160-4.5 MCG/ACT inhaler, Inhale 2 puffs by mouth twice daily, Disp: 10.2 g, Rfl: 5 .  tamsulosin (FLOMAX) 0.4 MG CAPS capsule, Take 1 capsule (0.4 mg total) by mouth daily., Disp: 90 capsule, Rfl: 0 .  Tiotropium Bromide Monohydrate (SPIRIVA RESPIMAT) 2.5 MCG/ACT AERS, Inhale 2 puffs into the lungs daily., Disp: 4 g, Rfl: 0 .  azithromycin (ZITHROMAX) 500 MG tablet, Take 1 tablet (500 mg total) by mouth daily for 5 days., Disp: 5 tablet, Rfl: 0 .  furosemide (LASIX) 20 MG tablet, Take 1 tablet (20 mg total) by mouth  daily., Disp: 30 tablet, Rfl: 11 .  predniSONE (STERAPRED UNI-PAK 21 TAB) 10 MG (21) TBPK tablet, Take as directed, Disp: 21 tablet, Rfl: 0 .  rosuvastatin (CRESTOR) 10 MG tablet, Take 1 tablet (10 mg total) by mouth daily., Disp: 90 tablet, Rfl: 3  Review of Systems:  Constitutional: Denies fever, chills, diaphoresis, appetite change and fatigue.  HEENT: Denies photophobia, eye pain, redness, hearing loss, ear pain,  mouth sores, trouble swallowing, neck pain, neck stiffness and tinnitus.   Respiratory: Denies  chest tightness. Cardiovascular: Denies chest pain, palpitations and leg  swelling.  Gastrointestinal: Denies nausea, vomiting, abdominal pain, diarrhea, constipation, blood in stool and abdominal distention.  Genitourinary: Denies dysuria, urgency, frequency, hematuria, flank pain and difficulty urinating.  Endocrine: Denies: hot or cold intolerance, sweats, changes in hair or nails, polyuria, polydipsia. Musculoskeletal: Denies myalgias, back pain, joint swelling, arthralgias and gait problem.  Skin: Denies pallor, rash and wound.  Neurological: Denies dizziness, seizures, syncope, weakness, light-headedness, numbness and headaches.  Hematological: Denies adenopathy. Easy bruising, personal or family bleeding history  Psychiatric/Behavioral: Denies suicidal ideation, mood changes, confusion, nervousness, sleep disturbance and agitation   Assessment and Plan:  COPD exacerbation ([place])  -With increased wheezing, shortness of breath and phlegm production, I will go ahead and treat him as a COPD exacerbation with a 6-day prednisone taper and azithromycin 500 mg for 5 days. -I have advised that he go to local pharmacy and be tested for Covid. -He knows to go to the emergency department over the weekend with worsening shortness of breath. -He will contact us beginning of the next week for symptoms that are not improving.    I discussed the assessment and treatment plan with the patient. The patient was provided an opportunity to ask questions and all were answered. The patient agreed with the plan and demonstrated an understanding of the instructions.   The patient was advised to call back or seek an in-person evaluation if the symptoms worsen or if the condition fails to improve as anticipated.  I provided 22 minutes of non-face-to-face time during this encounter.   [name], MD [facility]

## 2020-07-24 DIAGNOSIS — Z20822 Contact with and (suspected) exposure to covid-19: Secondary | ICD-10-CM | POA: Diagnosis not present

## 2020-08-08 ENCOUNTER — Other Ambulatory Visit: Payer: Self-pay | Admitting: Adult Health

## 2020-08-08 NOTE — Telephone Encounter (Signed)
Refill request for ibuprofen.   You have not seen him recently (telephonic visit in April). No upcoming appointments and rx requested is an NSAID.

## 2020-08-22 ENCOUNTER — Ambulatory Visit
Admission: RE | Admit: 2020-08-22 | Discharge: 2020-08-22 | Disposition: A | Discharge status: Home / Self Care | Payer: Medicare Other | Source: Ambulatory Visit | Attending: Acute Care | Admitting: Acute Care

## 2020-08-22 DIAGNOSIS — I2510 Atherosclerotic heart disease of native coronary artery without angina pectoris: Secondary | ICD-10-CM | POA: Diagnosis not present

## 2020-08-22 DIAGNOSIS — I700 Atherosclerosis of aorta: Secondary | ICD-10-CM | POA: Diagnosis not present

## 2020-08-22 DIAGNOSIS — S2220XA Unspecified fracture of sternum, initial encounter for closed fracture: Secondary | ICD-10-CM | POA: Diagnosis not present

## 2020-08-22 DIAGNOSIS — Z87891 Personal history of nicotine dependence: Secondary | ICD-10-CM

## 2020-08-22 DIAGNOSIS — J432 Centrilobular emphysema: Secondary | ICD-10-CM | POA: Diagnosis not present

## 2020-08-27 ENCOUNTER — Other Ambulatory Visit: Payer: Self-pay | Admitting: Internal Medicine

## 2020-08-30 NOTE — Progress Notes (Signed)
Please call patient and let them  know their  low dose Ct was read as a Lung RADS 2: nodules that are benign in appearance and behavior with a very low likelihood of becoming a clinically active cancer due to size or lack of growth. Recommendation per radiology is for a repeat LDCT in 12 months. .Please let them  know we will order and schedule their  annual screening scan for 08/2021. Please let them  know there was notation of CAD on their  scan.  Please remind the patient  that this is a non-gated exam therefore degree or severity of disease  cannot be determined. Please have them  follow up with their PCP regarding potential risk factor modification, dietary therapy or pharmacologic therapy if clinically indicated. Pt.  is  currently on statin therapy. Please place order for annual  screening scan for  08/2021 and fax results to PCP. Thanks so much.  Chase Lloyd, patient is followed by cards and is already on a statin. He had an echo 01/12/2019. Thanks

## 2020-08-31 ENCOUNTER — Other Ambulatory Visit: Payer: Self-pay | Admitting: *Deleted

## 2020-08-31 ENCOUNTER — Telehealth: Payer: Self-pay | Admitting: Adult Health

## 2020-08-31 ENCOUNTER — Other Ambulatory Visit: Payer: Self-pay | Admitting: Internal Medicine

## 2020-08-31 DIAGNOSIS — Z87891 Personal history of nicotine dependence: Secondary | ICD-10-CM

## 2020-08-31 NOTE — Telephone Encounter (Signed)
Pt called to request a refill for omeprazole (prilosec) send to:  [facility] [address] [place] [address]  [place] [address]  Phone:  [phone] Fax:  [phone]

## 2020-09-04 ENCOUNTER — Telehealth: Payer: Self-pay | Admitting: Internal Medicine

## 2020-09-04 DIAGNOSIS — U071 COVID-19: Secondary | ICD-10-CM | POA: Diagnosis not present

## 2020-09-04 DIAGNOSIS — J441 Chronic obstructive pulmonary disease with (acute) exacerbation: Secondary | ICD-10-CM | POA: Diagnosis not present

## 2020-09-04 DIAGNOSIS — R52 Pain, unspecified: Secondary | ICD-10-CM | POA: Diagnosis not present

## 2020-09-04 MED ORDER — OMEPRAZOLE 40 MG PO CPDR: 40.0000 mg | DELAYED_RELEASE_CAPSULE | Freq: Two times a day (BID) | ORAL | 3 refills | Status: DC

## 2020-09-04 NOTE — Telephone Encounter (Signed)
Spoke with pt's wife, Latanya Presser. She is aware of Dr. Mauricio Po response. Nothing further was needed.

## 2020-09-04 NOTE — Telephone Encounter (Signed)
Spoke with the pt's spouse  She states she and the pt both were dx with Covid 19 09/03/20  Pt went to UC and was given zpack to take  He is scheduled for monoclonal antibody infusion tomorrow  Pt is having fever, increased SOB, dry cough and aches  He is using his neb a couple times per day Spouse asking if Dr Shearon Stalls wants him to go ahead and take the zpack  Please advise, thanks!

## 2020-09-04 NOTE — Telephone Encounter (Signed)
I am very sorry to hear that he is sick. If his oxygen levels start dropping consistently below 85% on his existing [facility] and they don't come up, he should consider coming to the ED.  I do not think taking azithromycin in this case will be helpful and don't recommend it. I do recommend he follow through with monoclonal ab infusion.  I hope he feels better.

## 2020-09-05 DIAGNOSIS — U071 COVID-19: Secondary | ICD-10-CM | POA: Diagnosis not present

## 2020-09-07 ENCOUNTER — Telehealth: Payer: Self-pay | Admitting: Adult Health

## 2020-09-07 ENCOUNTER — Encounter: Payer: Self-pay | Admitting: Adult Health

## 2020-09-07 ENCOUNTER — Other Ambulatory Visit: Payer: Self-pay | Admitting: Adult Health

## 2020-09-07 MED ORDER — DOXYCYCLINE HYCLATE 100 MG PO CAPS
100.0000 mg | ORAL_CAPSULE | Freq: Two times a day (BID) | ORAL | 0 refills | Status: DC
Start: 2020-09-07 — End: 2020-10-23

## 2020-09-07 NOTE — Telephone Encounter (Signed)
Pt wife call and stated her husband havePOCD and  covid and need something for congestion. pt wife want a call back.

## 2020-09-07 NOTE — Telephone Encounter (Signed)
This has been addressed via Mychart message to the patient

## 2020-10-23 ENCOUNTER — Other Ambulatory Visit: Payer: Self-pay

## 2020-10-23 ENCOUNTER — Ambulatory Visit (INDEPENDENT_AMBULATORY_CARE_PROVIDER_SITE_OTHER): Payer: Medicare Other

## 2020-10-23 DIAGNOSIS — Z1211 Encounter for screening for malignant neoplasm of colon: Secondary | ICD-10-CM

## 2020-10-23 DIAGNOSIS — Z0000 Encounter for general adult medical examination without abnormal findings: Secondary | ICD-10-CM

## 2020-10-23 NOTE — Progress Notes (Signed)
Virtual Visit via Telephone Note  I connected with  Chase Lloyd on 10/23/20 at 11:00 AM EST by telephone and verified that I am speaking with the correct person using two identifiers.  Location: Patient: Home Provider: Office Persons participating in the virtual visit: patient/Nurse Health Advisor   I discussed the limitations, risks, security and privacy concerns of performing an evaluation and management service by telephone and the availability of in person appointments. The patient expressed understanding and agreed to proceed.  Interactive audio and video telecommunications were attempted between this nurse and patient, however failed, due to patient having technical difficulties OR patient did not have access to video capability.  We continued and completed visit with audio only.  Some vital signs may be absent or patient reported.   Marzella Schlein, LPN    Subjective:   Chase Lloyd is a 64 y.o. male who presents for an Initial Medicare Annual Wellness Visit.  Review of Systems     Cardiac Risk Factors include: advanced age (>16men, >74 women);hypertension;male gender     Objective:    There were no vitals filed for this visit. There is no height or weight on file to calculate BMI.  Advanced Directives 10/23/2020 07/30/2019 04/29/2019 02/14/2019 10/07/2018 10/06/2018 07/18/2018  Does Patient Have a Medical Advance Directive? No No No No No No No  Would patient like information on creating a medical advance directive? No - Patient declined - No - Patient declined - No - Patient declined No - Patient declined No - Patient declined    Current Medications (verified) Outpatient Encounter Medications as of 10/23/2020  Medication Sig  . albuterol (PROVENTIL) (2.5 MG/3ML) 0.083% nebulizer solution Take 3 mLs (2.5 mg total) by nebulization every 4 (four) hours as needed for wheezing or shortness of breath.  Marland Kitchen albuterol (VENTOLIN HFA) 108 (90 Base) MCG/ACT inhaler Inhale 2 puffs  into the lungs every 6 (six) hours as needed for wheezing or shortness of breath.  Marland Kitchen aspirin EC 81 MG tablet Take 81 mg by mouth daily.  Marland Kitchen ibuprofen (ADVIL) 800 MG tablet TAKE 1 TABLET BY MOUTH EVERY 8 HOURS AS NEEDED  . isosorbide mononitrate (IMDUR) 30 MG 24 hr tablet Take 1 tablet by mouth once daily  . metoprolol succinate (TOPROL-XL) 25 MG 24 hr tablet Take 1 tablet (25 mg total) by mouth daily.  . OXYGEN 2LPM AS NEEDED PER PT  . SYMBICORT 160-4.5 MCG/ACT inhaler Inhale 2 puffs by mouth twice daily  . Tiotropium Bromide Monohydrate (SPIRIVA RESPIMAT) 2.5 MCG/ACT AERS Inhale 2 puffs into the lungs daily.  . furosemide (LASIX) 20 MG tablet Take 1 tablet (20 mg total) by mouth daily.  . predniSONE (STERAPRED UNI-PAK 21 TAB) 10 MG (21) TBPK tablet Take as directed (Patient not taking: Reported on 10/23/2020)  . rosuvastatin (CRESTOR) 10 MG tablet Take 1 tablet (10 mg total) by mouth daily.  . [DISCONTINUED] azelastine (ASTELIN) 0.1 % nasal spray Place 1 spray into both nostrils 2 (two) times daily. Use in each nostril as directed (Patient not taking: Reported on 10/23/2020)  . [DISCONTINUED] doxycycline (VIBRAMYCIN) 100 MG capsule Take 1 capsule (100 mg total) by mouth 2 (two) times daily. (Patient not taking: Reported on 10/23/2020)  . [DISCONTINUED] fluticasone (FLONASE) 50 MCG/ACT nasal spray Place 1 spray into both nostrils daily. (Patient not taking: Reported on 10/23/2020)  . [DISCONTINUED] omeprazole (PRILOSEC) 40 MG capsule Take 1 capsule (40 mg total) by mouth 2 (two) times daily.  . [DISCONTINUED] tamsulosin (FLOMAX) 0.4  MG CAPS capsule Take 1 capsule (0.4 mg total) by mouth daily. (Patient not taking: Reported on 10/23/2020)   No facility-administered encounter medications on file as of 10/23/2020.    Allergies (verified) Patient has no known allergies.   History: Past Medical History:  Diagnosis Date  . Back pain   . COPD (chronic obstructive pulmonary disease) (HCC)   . DDD  (degenerative disc disease), lumbar   . Depression   . Emphysema of lung (HCC)   . Empyema (HCC)    2016  (was in [place])  . GERD (gastroesophageal reflux disease)   . Hearing loss of right ear    started 2001.     microphone in right ear ,  and hearing aid in left  . Hodgkin's disease (HCC) 1999  . Hypotension   . Meniere's disease   . Pneumonia   . Tinnitus    Past Surgical History:  Procedure Laterality Date  . BACK SURGERY    . CERVICAL FUSION  2016  . ELBOW SURGERY    . EYE SURGERY    . IR THORACENTESIS ASP PLEURAL SPACE W/IMG GUIDE  10/08/2018  . LAMINECTOMY  1999  . lower back surgery    . RIGHT/LEFT HEART CATH AND CORONARY ANGIOGRAPHY N/A 04/29/2019   Procedure: RIGHT/LEFT HEART CATH AND CORONARY ANGIOGRAPHY;  Surgeon: Lennette Bihari, MD;  Location: [facility] INVASIVE CV LAB;  Service: Cardiovascular;  Laterality: N/A;  . TONSILLECTOMY     Family History  Problem Relation Age of Onset  . Hypertension Father   . Diabetes Father   . Testicular cancer Brother   . Melanoma Brother    Social History   Socioeconomic History  . Marital status: Married    Spouse name: Not on file  . Number of children: Not on file  . Years of education: Not on file  . Highest education level: Not on file  Occupational History  . Occupation: Retired  Tobacco Use  . Smoking status: Former Smoker    Packs/day: 1.50    Years: 47.00    Pack years: 70.50    Types: Cigarettes    Quit date: 06/13/2018    Years since quitting: 2.3  . Smokeless tobacco: Never Used  Vaping Use  . Vaping Use: Never used  Substance and Sexual Activity  . Alcohol use: Yes    Comment: 24 cans in a week  . Drug use: No  . Sexual activity: Not on file  Other Topics Concern  . 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 Difficulty of Paying Living Expenses: Not hard at all  Food Insecurity: No Food  Insecurity  . Worried About Programme researcher, broadcasting/film/video in the Last Year: Never true  . Ran Out of Food in the Last Year: Never true  Transportation Needs: No Transportation Needs  . Lack of Transportation (Medical): No  . Lack of Transportation (Non-Medical): No  Physical Activity: Inactive  . Days of Exercise per Week: 0 days  . Minutes of Exercise per Session: 0 min  Stress: No Stress Concern Present  . Feeling of Stress : Not at all  Social Connections: Moderately Isolated  . Frequency of Communication with Friends and Family: More than three times a week  . Frequency of Social Gatherings with Friends and Family: Twice a week  . Attends Religious Services: Never  .  Active Member of Clubs or Organizations: No  . Attends Archivist Meetings: Never  . Marital Status: Married    Tobacco Counseling Counseling given: Not Answered   Clinical Intake:  Pre-visit preparation completed: Yes  Pain : No/denies pain     BMI - recorded: 27.12 Nutritional Status: BMI 25 -29 Overweight Diabetes: No  How often do you need to have someone help you when you read instructions, pamphlets, or other written materials from your doctor or pharmacy?: 1 - Never  Diabetic?No  Interpreter Needed?: No  Information entered by :: Charlott Rakes, LPN   Activities of Daily Living In your present state of health, do you have any difficulty performing the following activities: 10/23/2020  Hearing? Y  Comment has hearing aids and right ear hard of hearing  Vision? N  Difficulty concentrating or making decisions? N  Walking or climbing stairs? Y  Comment can't walk up many  Dressing or bathing? N  Doing errands, shopping? N  Preparing Food and eating ? N  Using the Toilet? N  In the past six months, have you accidently leaked urine? N  Do you have problems with loss of bowel control? N  Managing your Medications? N  Managing your Finances? N  Housekeeping or managing your Housekeeping? N   Some recent data might be hidden    Patient Care Team: [name], NP as PCP - General (Family Medicine) Belva Crome, MD as PCP - Cardiology (Cardiology) [facility], [facility] (Audiology)  Indicate any recent Medical Services you may have received from other than [facility] providers in the past year (date may be approximate).     Assessment:   This is a routine wellness examination for Chase Lloyd.  Hearing/Vision screen  Hearing Screening   125Hz  250Hz  500Hz  1000Hz  2000Hz  3000Hz  4000Hz  6000Hz  8000Hz   Right ear:           Left ear:           Comments: Wears hearing aids   Vision Screening Comments: Pt stated he doesn't know provider but has had an appt this year  Dietary issues and exercise activities discussed: Current Exercise Habits: The patient does not participate in regular exercise at present  Goals    . Patient Stated     None at this time      Depression Screen PHQ 2/9 Scores 10/23/2020 12/14/2018 11/10/2018  PHQ - 2 Score 0 6 6  PHQ- 9 Score - 14 14    Fall Risk Fall Risk  10/23/2020 09/28/2019  Falls in the past year? 0 0  Comment - Emmi Telephone Survey: data to providers prior to load  Number falls in past yr: 0 -  Injury with Fall? 0 -  Risk for fall due to : Impaired vision;Impaired balance/gait -  Risk for fall due to: Comment tinnitus nad vertigo -  Follow up Falls prevention discussed -    FALL RISK PREVENTION PERTAINING TO THE HOME:  Any stairs in or around the home?  Home free of loose throw rugs in walkways, pet beds, electrical cords, etc? Yes  Adequate lighting in your home to reduce risk of falls? Yes   ASSISTIVE DEVICES UTILIZED TO PREVENT FALLS:  Life alert? No  Use of a cane, walker or w/c? Yes  TIMED UP AND GO:  Was the test performed? No .      Cognitive Function:     6CIT Screen 10/23/2020  What Year? 0 points  What month? 0  points  Count back from 20 0 points  Months in reverse 0 points  Repeat phrase  2 points    Immunizations Immunization History  Administered Date(s) Administered  . Fluad Quad(high Dose 65+) 09/02/2019  . Influenza,inj,Quad PF,6+ Mos 12/27/2015, 10/11/2018  . Pneumococcal Conjugate-13 12/26/2015  . Td 01/01/2014  . Zoster 08/04/2015    TDAP status: Up to date  Flu Vaccine status: Declined, Education has been provided regarding the importance of this vaccine but patient still declined. Advised may receive this vaccine at local pharmacy or Health Dept. Aware to provide a copy of the vaccination record if obtained from local pharmacy or Health Dept. Verbalized acceptance and understanding.  Pneumococcal vaccine status: Due, Education has been provided regarding the importance of this vaccine. Advised may receive this vaccine at local pharmacy or Health Dept. Aware to provide a copy of the vaccination record if obtained from local pharmacy or Health Dept. Verbalized acceptance and understanding.  Covid-19 vaccine status: Declined, Education has been provided regarding the importance of this vaccine but patient still declined. Advised may receive this vaccine at local pharmacy or Health Dept.or vaccine clinic. Aware to provide a copy of the vaccination record if obtained from local pharmacy or Health Dept. Verbalized acceptance and understanding.  Qualifies for Shingles Vaccine? Yes   Zostavax completed Yes   Shingrix Completed?: No.    Education has been provided regarding the importance of this vaccine. Patient has been advised to call insurance company to determine out of pocket expense if they have not yet received this vaccine. Advised may also receive vaccine at local pharmacy or Health Dept. Verbalized acceptance and understanding.  Screening Tests Health Maintenance  Topic Date Due  . COLONOSCOPY  Never done  . COVID-19 Vaccine (1) 11/08/2020 (Originally 08/26/1968)  . INFLUENZA VACCINE  01/31/2021 (Originally 06/03/2020)  . PNA vac Low Risk Adult (2 of 2 -  PPSV23) 10/23/2021 (Originally 08/27/2019)  . TETANUS/TDAP  01/02/2024  . Hepatitis C Screening  Completed    Health Maintenance  Health Maintenance Due  Topic Date Due  . COLONOSCOPY  Never done    Colorectal cancer screening: Referral to GI placed 10/23/20. Pt aware the office will call re: appt.   Hepatitis C Screening:  Completed 05/05/18  Vision Screening: Recommended annual ophthalmology exams for early detection of glaucoma and other disorders of the eye. Is the patient up to date with their annual eye exam?  Yes  Who is the provider or what is the name of the office in which the patient attends annual eye exams? Unsure of providers name   Dental Screening: Recommended annual dental exams for proper oral hygiene  Community Resource Referral / Chronic Care Management: CRR required this visit?  No   CCM required this visit?  No      Plan:     I have personally reviewed and noted the following in the patient's chart:   . Medical and social history . Use of alcohol, tobacco or illicit drugs  . Current medications and supplements . Functional ability and status . Nutritional status . Physical activity . Advanced directives . List of other physicians . Hospitalizations, surgeries, and ER visits in previous 12 months . Vitals . Screenings to include cognitive, depression, and falls . Referrals and appointments   In addition, I have reviewed and discussed with patient certain preventive protocols, quality metrics, and best practice recommendations. A written personalized care plan for preventive services as well as general preventive health recommendations were provided  to patient.     Willette Brace, LPN   [id number]   Nurse Notes: None

## 2020-10-23 NOTE — Patient Instructions (Signed)
Mr. Chase Lloyd , Thank you for taking time to come for your Medicare Wellness Visit. I appreciate your ongoing commitment to your health goals. Please review the following plan we discussed and let me know if I can assist you in the future.   Screening recommendations/referrals: Colonoscopy: ordred today 10/23/20 Recommended yearly ophthalmology/optometry visit for glaucoma screening and checkup Recommended yearly dental visit for hygiene and checkup  Vaccinations: Influenza vaccine: Declined Pneumococcal vaccine: Due and discussed Tdap vaccine: Up to date Shingles vaccine: Shingrix discussed. Please contact your pharmacy for coverage information.    Covid-19: Declined  Advanced directives: Advance directive discussed with you today. Even though you declined this today please call our office should you change your mind and we can give you the proper paperwork for you to fill out.  Conditions/risks identified: None at this time  Next appointment: Follow up in one year for your annual wellness visit.   Preventive Care 66 Years and Older, Male Preventive care refers to lifestyle choices and visits with your health care provider that can promote health and wellness. What does preventive care include?  A yearly physical exam. This is also called an annual well check.  Dental exams once or twice a year.  Routine eye exams. Ask your health care provider how often you should have your eyes checked.  Personal lifestyle choices, including:  Daily care of your teeth and gums.  Regular physical activity.  Eating a healthy diet.  Avoiding tobacco and drug use.  Limiting alcohol use.  Practicing safe sex.  Taking low doses of aspirin every day.  Taking vitamin and mineral supplements as recommended by your health care provider. What happens during an annual well check? The services and screenings done by your health care provider during your annual well check will depend on your age,  overall health, lifestyle risk factors, and family history of disease. Counseling  Your health care provider may ask you questions about your:  Alcohol use.  Tobacco use.  Drug use.  Emotional well-being.  Home and relationship well-being.  Sexual activity.  Eating habits.  History of falls.  Memory and ability to understand (cognition).  Work and work Statistician. Screening  You may have the following tests or measurements:  Height, weight, and BMI.  Blood pressure.  Lipid and cholesterol levels. These may be checked every 5 years, or more frequently if you are over 16 years old.  Skin check.  Lung cancer screening. You may have this screening every year starting at age 14 if you have a 30-pack-year history of smoking and currently smoke or have quit within the past 15 years.  Fecal occult blood test (FOBT) of the stool. You may have this test every year starting at age 71.  Flexible sigmoidoscopy or colonoscopy. You may have a sigmoidoscopy every 5 years or a colonoscopy every 10 years starting at age 65.  Prostate cancer screening. Recommendations will vary depending on your family history and other risks.  Hepatitis C blood test.  Hepatitis B blood test.  Sexually transmitted disease (STD) testing.  Diabetes screening. This is done by checking your blood sugar (glucose) after you have not eaten for a while (fasting). You may have this done every 1-3 years.  Abdominal aortic aneurysm (AAA) screening. You may need this if you are a current or former smoker.  Osteoporosis. You may be screened starting at age 68 if you are at high risk. Talk with your health care provider about your test results, treatment options, and  if necessary, the need for more tests. Vaccines  Your health care provider may recommend certain vaccines, such as:  Influenza vaccine. This is recommended every year.  Tetanus, diphtheria, and acellular pertussis (Tdap, Td) vaccine. You may  need a Td booster every 10 years.  Zoster vaccine. You may need this after age 40.  Pneumococcal 13-valent conjugate (PCV13) vaccine. One dose is recommended after age 70.  Pneumococcal polysaccharide (PPSV23) vaccine. One dose is recommended after age 46. Talk to your health care provider about which screenings and vaccines you need and how often you need them. This information is not intended to replace advice given to you by your health care provider. Make sure you discuss any questions you have with your health care provider. Document Released: 11/16/2015 Document Revised: 07/09/2016 Document Reviewed: 08/21/2015 Elsevier Interactive Patient Education  2017 [place] Prevention in the Home Falls can cause injuries. They can happen to people of all ages. There are many things you can do to make your home safe and to help prevent falls. What can I do on the outside of my home?  Regularly fix the edges of walkways and driveways and fix any cracks.  Remove anything that might make you trip as you walk through a door, such as a raised step or threshold.  Trim any bushes or trees on the path to your home.  Use bright outdoor lighting.  Clear any walking paths of anything that might make someone trip, such as rocks or tools.  Regularly check to see if handrails are loose or broken. Make sure that both sides of any steps have handrails.  Any raised decks and porches should have guardrails on the edges.  Have any leaves, snow, or ice cleared regularly.  Use sand or salt on walking paths during winter.  Clean up any spills in your garage right away. This includes oil or grease spills. What can I do in the bathroom?  Use night lights.  Install grab bars by the toilet and in the tub and shower. Do not use towel bars as grab bars.  Use non-skid mats or decals in the tub or shower.  If you need to sit down in the shower, use a plastic, non-slip stool.  Keep the floor  dry. Clean up any water that spills on the floor as soon as it happens.  Remove soap buildup in the tub or shower regularly.  Attach bath mats securely with double-sided non-slip rug tape.  Do not have throw rugs and other things on the floor that can make you trip. What can I do in the bedroom?  Use night lights.  Make sure that you have a light by your bed that is easy to reach.  Do not use any sheets or blankets that are too big for your bed. They should not hang down onto the floor.  Have a firm chair that has side arms. You can use this for support while you get dressed.  Do not have throw rugs and other things on the floor that can make you trip. What can I do in the kitchen?  Clean up any spills right away.  Avoid walking on wet floors.  Keep items that you use a lot in easy-to-reach places.  If you need to reach something above you, use a strong step stool that has a grab bar.  Keep electrical cords out of the way.  Do not use floor polish or wax that makes floors slippery. If you  must use wax, use non-skid floor wax.  Do not have throw rugs and other things on the floor that can make you trip. What can I do with my stairs?  Do not leave any items on the stairs.  Make sure that there are handrails on both sides of the stairs and use them. Fix handrails that are broken or loose. Make sure that handrails are as [place] as the stairways.  Check any carpeting to make sure that it is firmly attached to the stairs. Fix any carpet that is loose or worn.  Avoid having throw rugs at the top or bottom of the stairs. If you do have throw rugs, attach them to the floor with carpet tape.  Make sure that you have a light switch at the top of the stairs and the bottom of the stairs. If you do not have them, ask someone to add them for you. What else can I do to help prevent falls?  Wear shoes that:  Do not have high heels.  Have rubber bottoms.  Are comfortable and fit you  well.  Are closed at the toe. Do not wear sandals.  If you use a stepladder:  Make sure that it is fully opened. Do not climb a closed stepladder.  Make sure that both sides of the stepladder are locked into place.  Ask someone to hold it for you, if possible.  Clearly mark and make sure that you can see:  Any grab bars or handrails.  First and last steps.  Where the edge of each step is.  Use tools that help you move around (mobility aids) if they are needed. These include:  Canes.  Walkers.  Scooters.  Crutches.  Turn on the lights when you go into a dark area. Replace any light bulbs as soon as they burn out.  Set up your furniture so you have a clear path. Avoid moving your furniture around.  If any of your floors are uneven, fix them.  If there are any pets around you, be aware of where they are.  Review your medicines with your doctor. Some medicines can make you feel dizzy. This can increase your chance of falling. Ask your doctor what other things that you can do to help prevent falls. This information is not intended to replace advice given to you by your health care provider. Make sure you discuss any questions you have with your health care provider. Document Released: 08/16/2009 Document Revised: 03/27/2016 Document Reviewed: 11/24/2014 Elsevier Interactive Patient Education  2017 [employer].

## 2020-11-02 ENCOUNTER — Other Ambulatory Visit: Payer: Self-pay | Admitting: Adult Health

## 2020-12-11 ENCOUNTER — Encounter (INDEPENDENT_AMBULATORY_CARE_PROVIDER_SITE_OTHER): Payer: Medicare Other | Admitting: Ophthalmology

## 2020-12-25 ENCOUNTER — Encounter (INDEPENDENT_AMBULATORY_CARE_PROVIDER_SITE_OTHER): Payer: Self-pay | Admitting: Ophthalmology

## 2020-12-25 ENCOUNTER — Ambulatory Visit (INDEPENDENT_AMBULATORY_CARE_PROVIDER_SITE_OTHER): Payer: Medicare Other | Admitting: Ophthalmology

## 2020-12-25 ENCOUNTER — Other Ambulatory Visit: Payer: Self-pay

## 2020-12-25 DIAGNOSIS — D3132 Benign neoplasm of left choroid: Secondary | ICD-10-CM | POA: Diagnosis not present

## 2020-12-25 NOTE — Assessment & Plan Note (Addendum)
The nature of choroidal nevus was discussed with the patient and an informational form was offered.  Photo documentation was discussed with the patient.  Periodic follow-up may be needed for a lifetime. The patient's questions were answered. At minimum, annual exams will be needed if no signs of early growth.  Current color fundus photography confirms over the last 1 year no interval change in size appearance of choroidal nevus in the peripapillary choroidal nevus nasal to the nerve  B-scan ultrasonography confirms no appreciable thickness, no subretinal fluid

## 2020-12-25 NOTE — Progress Notes (Signed)
12/25/2020     CHIEF COMPLAINT Patient presents for Retina Follow Up (6 MO FU OS, B-Scan OS///Pt report stable vision OS. Pt denies any new F/F, pain, or pressure OS. )   HISTORY OF PRESENT ILLNESS: Chase Lloyd is a 65 y.o. male who presents to the clinic today for:   HPI    Retina Follow Up    Patient presents with  Other.  In left eye.  This started 6 months ago.  Duration of 6 months.  Since onset it is stable. Additional comments: 6 MO FU OS, B-Scan OS   Pt report stable vision OS. Pt denies any new F/F, pain, or pressure OS.        Last edited by Nichola Sizer D on 12/25/2020  2:09 PM. (History)      Referring physician: [name], NP [place],  [place] [id number]  HISTORICAL INFORMATION:   Selected notes from the [facility]: No current outpatient medications on file. (Ophthalmic Drugs)   No current facility-administered medications for this visit. (Ophthalmic Drugs)   Current Outpatient Medications (Other)  Medication Sig  . albuterol (PROVENTIL) (2.5 MG/3ML) 0.083% nebulizer solution Take 3 mLs (2.5 mg total) by nebulization every 4 (four) hours as needed for wheezing or shortness of breath.  Marland Kitchen albuterol (VENTOLIN HFA) 108 (90 Base) MCG/ACT inhaler Inhale 2 puffs into the lungs every 6 (six) hours as needed for wheezing or shortness of breath.  Marland Kitchen aspirin EC 81 MG tablet Take 81 mg by mouth daily.  . furosemide (LASIX) 20 MG tablet Take 1 tablet (20 mg total) by mouth daily.  Marland Kitchen ibuprofen (ADVIL) 800 MG tablet TAKE 1 TABLET BY MOUTH EVERY 8 HOURS AS NEEDED  . isosorbide mononitrate (IMDUR) 30 MG 24 hr tablet Take 1 tablet by mouth once daily  . metoprolol succinate (TOPROL-XL) 25 MG 24 hr tablet Take 1 tablet (25 mg total) by mouth daily.  . OXYGEN 2LPM AS NEEDED PER PT  . predniSONE (STERAPRED UNI-PAK 21 TAB) 10 MG (21) TBPK tablet Take as directed (Patient not taking: Reported on 10/23/2020)  .  rosuvastatin (CRESTOR) 10 MG tablet Take 1 tablet (10 mg total) by mouth daily.  . SYMBICORT 160-4.5 MCG/ACT inhaler Inhale 2 puffs by mouth twice daily  . Tiotropium Bromide Monohydrate (SPIRIVA RESPIMAT) 2.5 MCG/ACT AERS Inhale 2 puffs into the lungs daily.   No current facility-administered medications for this visit. (Other)      REVIEW OF SYSTEMS:    ALLERGIES No Known Allergies  PAST MEDICAL HISTORY Past Medical History:  Diagnosis Date  . Back pain   . COPD (chronic obstructive pulmonary disease) ([place])   . DDD (degenerative disc disease), lumbar   . Depression   . Emphysema of lung ([place])   . Empyema ([place])    2016  (was in [place])  . GERD (gastroesophageal reflux disease)   . Hearing loss of right ear    started 2001.     microphone in right ear ,  and hearing aid in left  . Hodgkin's disease ([place]) 1999  . Hypotension   . Meniere's disease   . Pneumonia   . Tinnitus    Past Surgical History:  Procedure Laterality Date  . BACK SURGERY    . CERVICAL FUSION  2016  . ELBOW SURGERY    . EYE SURGERY    . IR THORACENTESIS ASP PLEURAL SPACE W/IMG GUIDE  10/08/2018  .  LAMINECTOMY  1999  . lower back surgery    . RIGHT/LEFT HEART CATH AND CORONARY ANGIOGRAPHY N/A 04/29/2019   Procedure: RIGHT/LEFT HEART CATH AND CORONARY ANGIOGRAPHY;  Surgeon: Troy Sine, MD;  Location: [facility];  Service: Cardiovascular;  Laterality: N/A;  . TONSILLECTOMY      FAMILY HISTORY Family History  Problem Relation Age of Onset  . Hypertension Father   . Diabetes Father   . Testicular cancer Brother   . Melanoma Brother     SOCIAL HISTORY Social History   Tobacco Use  . Smoking status: Former Smoker    Packs/Chase: 1.50    Years: 47.00    Pack years: 70.50    Types: Cigarettes    Quit date: 06/13/2018    Years since quitting: 2.5  . Smokeless tobacco: Never Used  Vaping Use  . Vaping Use: Never used  Substance Use Topics  . Alcohol use: Yes    Comment: 24  cans in a week  . Drug use: No         OPHTHALMIC EXAM:  Base Eye Exam    Visual Acuity (ETDRS)      Right Left   Dist [place] 20/30 -1 20/50 -2   Dist ph [place] NI 20/30       Tonometry (Tonopen, 2:15 PM)      Right Left   Pressure 13 18       Pupils      Pupils Dark Light Shape React APD   Right PERRL 5 4 Round Slow None   Left PERRL 5 4 Round Slow None       Visual Fields (Counting fingers)      Left Right    Full Full       Extraocular Movement      Right Left    Full Full       Neuro/Psych    Oriented x3: Yes   Mood/Affect: Normal       Dilation    Left eye: 1.0% Mydriacyl, 2.5% Phenylephrine @ 2:15 PM        Slit Lamp and Fundus Exam    External Exam      Right Left   External Normal Normal       Slit Lamp Exam      Right Left   Lids/Lashes Normal Normal   Conjunctiva/Sclera White and quiet White and quiet   Cornea Clear Clear   Anterior Chamber Deep and quiet Deep and quiet   Iris Round and reactive Round and reactive   Lens 2+ Nuclear sclerosis 2+ Nuclear sclerosis   Anterior Vitreous Normal Normal       Fundus Exam      Right Left   Posterior Vitreous  Normal   Disc  Normal   C/D Ratio  0.0   Macula  Normal   Vessels  Normal   Periphery  Small, 1.5 disc diameter flat irregularly pigmented choroidal nevus inferonasal to the optic nerve Julli there is 1 small reddish hue yet no apparent tumor lakes, abnormal vascularity, no subretinal fluid, no atrophy, no lipofuscin          IMAGING AND PROCEDURES  Imaging and Procedures for 12/25/20  Color Fundus Photography Optos - OU - Both Eyes       Right Eye Progression has been stable. Disc findings include normal observations. Macula : normal observations.   Left Eye Progression has been stable. Disc findings include normal observations. Macula : normal observations. Vessels :  normal observations.   Notes Choroidal nevus, flat inferonasal to the optic nerve with no interval change from  February 2021.  OS.       B-Scan Ultrasound - OS - Left Eye       Quality was good. Findings included normal observations.   Notes No signs of retinal choroidal thickening small nevus nasal to the optic nerve no fluid                ASSESSMENT/PLAN:  Choroidal nevus of left eye The nature of choroidal nevus was discussed with the patient and an informational form was offered.  Photo documentation was discussed with the patient.  Periodic follow-up may be needed for a lifetime. The patient's questions were answered. At minimum, annual exams will be needed if no signs of early growth.  Current color fundus photography confirms over the last 1 year no interval change in size appearance of choroidal nevus in the peripapillary choroidal nevus nasal to the nerve  B-scan ultrasonography confirms no appreciable thickness, no subretinal fluid        ICD-10-CM   1. Choroidal nevus of left eye  D31.32 Color Fundus Photography Optos - OU - Both Eyes    B-Scan Ultrasound - OS - Left Eye    1. No macroscopic change in appearance or size or location of choroidal nevus nasal to the optic nerve OS, will observe  2.  3.  Ophthalmic Meds Ordered this visit:  No orders of the defined types were placed in this encounter.      Return in about 1 year (around 12/25/2021) for DILATE OU, COLOR FP.  There are no Patient Instructions on file for this visit.   Explained the diagnoses, plan, and follow up with the patient and they expressed understanding.  Patient expressed understanding of the importance of proper follow up care.   Clent Demark Amauri Keefe M.D. Diseases & Surgery of the Retina and Vitreous [facility] 12/25/20     Abbreviations: M myopia (nearsighted); A astigmatism; H hyperopia (farsighted); P presbyopia; Mrx spectacle prescription;  CTL contact lenses; OD right eye; OS left eye; OU both eyes  XT exotropia; ET esotropia; PEK punctate epithelial keratitis;  PEE punctate epithelial erosions; DES dry eye syndrome; MGD meibomian gland dysfunction; ATs artificial tears; PFAT's preservative free artificial tears; [place] nuclear sclerotic cataract; PSC posterior subcapsular cataract; ERM epi-retinal membrane; PVD posterior vitreous detachment; RD retinal detachment; DM diabetes mellitus; DR diabetic retinopathy; NPDR non-proliferative diabetic retinopathy; PDR proliferative diabetic retinopathy; CSME clinically significant macular edema; DME diabetic macular edema; dbh dot blot hemorrhages; CWS cotton wool spot; POAG primary open angle glaucoma; C/D cup-to-disc ratio; HVF humphrey visual field; GVF goldmann visual field; OCT optical coherence tomography; IOP intraocular pressure; BRVO Branch retinal vein occlusion; CRVO central retinal vein occlusion; CRAO central retinal artery occlusion; BRAO branch retinal artery occlusion; RT retinal tear; SB scleral buckle; PPV pars plana vitrectomy; VH Vitreous hemorrhage; PRP panretinal laser photocoagulation; IVK intravitreal kenalog; VMT vitreomacular traction; MH Macular hole;  NVD neovascularization of the disc; NVE neovascularization elsewhere; AREDS age related eye disease study; ARMD age related macular degeneration; POAG primary open angle glaucoma; EBMD epithelial/anterior basement membrane dystrophy; ACIOL anterior chamber intraocular lens; IOL intraocular lens; PCIOL posterior chamber intraocular lens; Phaco/IOL phacoemulsification with intraocular lens placement; [place] photorefractive keratectomy; LASIK laser assisted in situ keratomileusis; HTN hypertension; DM diabetes mellitus; COPD chronic obstructive pulmonary disease

## 2021-01-29 ENCOUNTER — Other Ambulatory Visit: Payer: Self-pay | Admitting: Adult Health

## 2021-01-29 NOTE — Telephone Encounter (Signed)
Last refill- 11/06/2020-90 tabs with 1 refill Last video visit- 07/20/2020  No future visit scheduled

## 2021-02-21 ENCOUNTER — Encounter: Payer: Self-pay | Admitting: Adult Health

## 2021-02-21 ENCOUNTER — Ambulatory Visit (INDEPENDENT_AMBULATORY_CARE_PROVIDER_SITE_OTHER): Payer: Medicare Other | Admitting: Adult Health

## 2021-02-21 ENCOUNTER — Other Ambulatory Visit: Payer: Self-pay

## 2021-02-21 ENCOUNTER — Ambulatory Visit (INDEPENDENT_AMBULATORY_CARE_PROVIDER_SITE_OTHER): Payer: Medicare Other

## 2021-02-21 VITALS — BP 142/50 | HR 85 | Temp 98.2°F | Ht 72.0 in | Wt 183.8 lb

## 2021-02-21 DIAGNOSIS — J441 Chronic obstructive pulmonary disease with (acute) exacerbation: Secondary | ICD-10-CM | POA: Diagnosis not present

## 2021-02-21 DIAGNOSIS — R059 Cough, unspecified: Secondary | ICD-10-CM | POA: Diagnosis not present

## 2021-02-21 MED ORDER — DOXYCYCLINE HYCLATE 100 MG PO CAPS: 100.0000 mg | ORAL_CAPSULE | Freq: Two times a day (BID) | ORAL | 0 refills | Status: DC

## 2021-02-21 MED ORDER — PREDNISONE 10 MG PO TABS: ORAL_TABLET | ORAL | 0 refills | Status: DC

## 2021-02-21 NOTE — Progress Notes (Signed)
Subjective:    Patient ID: Chase Lloyd, male    DOB: Aug 16, 1956, 65 y.o.   MRN: [id number]  HPI 65 year old male who  has a past medical history of Back pain, COPD (chronic obstructive pulmonary disease) ([place]), DDD (degenerative disc disease), lumbar, Depression, Emphysema of lung ([place]), Empyema ([place]), GERD (gastroesophageal reflux disease), Hearing loss of right ear, Hodgkin's disease ([place]) (1999), Hypotension, Meniere's disease, Pneumonia, and Tinnitus.  He presents to the office today for an acute issue. Symptoms started about a week ago.  His symptoms started with sinusitis-like symptoms.  Over the week has progressed into a productive cough with mostly yellow sputum phlegm but has noticed pink tinge to his phlegm on 1 occasion, increased wheezing, and shortness of breath.  He has had to start using his supplemental oxygen since this started and is up to 2 L via nasal cannula.  At home, a family member had 2 days of antibiotics from a leftover prescription( unknown antibiotic), he took it for 2 days and his symptoms started to improve and his phlegm became clear.  Once antibiotics ran out his symptoms resorted back to discolored mucus and worsening shortness of breath and wheezing.  He has been using his rescue inhaler.  Needs to reach out to pulmonary to get refills of his daily maintenance inhalers.  Not had any fevers or chills   Review of Systems See HPI   Past Medical History:  Diagnosis Date  . Back pain   . COPD (chronic obstructive pulmonary disease) ([place])   . DDD (degenerative disc disease), lumbar   . Depression   . Emphysema of lung ([place])   . Empyema ([place])    2016  (was in [place])  . GERD (gastroesophageal reflux disease)   . Hearing loss of right ear    started 2001.     microphone in right ear ,  and hearing aid in left  . Hodgkin's disease ([place]) 1999  . Hypotension   . Meniere's disease   . Pneumonia   . Tinnitus     Social History   Socioeconomic  History  . Marital status: Married    Spouse name: Not on file  . Number of children: Not on file  . Years of education: Not on file  . Highest education level: Not on file  Occupational History  . Occupation: Retired  Tobacco Use  . Smoking status: Former Smoker    Packs/day: 1.50    Years: 47.00    Pack years: 70.50    Types: Cigarettes    Quit date: 06/13/2018    Years since quitting: 2.6  . Smokeless tobacco: Never Used  Vaping Use  . Vaping Use: Never used  Substance and Sexual Activity  . Alcohol use: Yes    Comment: 24 cans in a week  . Drug use: No  . Sexual activity: Not on file  Other Topics Concern  . Not on file  Social History Narrative   Retied - worked as Conservation officer, nature Strain: [place]   . Difficulty of Paying Living Expenses: Not hard at all  Food Insecurity: No Food Insecurity  . Worried About Charity fundraiser in the Last Year: Never true  . Ran Out of Food in the Last Year: Never true  Transportation Needs: No Transportation Needs  . Lack of Transportation (Medical): No  . Lack of Transportation (Non-Medical): No  Physical  Activity: Inactive  . Days of Exercise per Week: 0 days  . Minutes of Exercise per Session: 0 min  Stress: No Stress Concern Present  . Feeling of Stress : Not at all  Social Connections: Moderately Isolated  . Frequency of Communication with Friends and Family: More than three times a week  . Frequency of Social Gatherings with Friends and Family: Twice a week  . Attends Religious Services: Never  . Active Member of Clubs or Organizations: No  . Attends Archivist Meetings: Never  . Marital Status: Married  Human resources officer Violence: Not At Risk  . Fear of Current or Ex-Partner: No  . Emotionally Abused: No  . Physically Abused: No  . Sexually Abused: No    Past Surgical History:  Procedure Laterality Date  . BACK SURGERY    . CERVICAL FUSION  2016  .  ELBOW SURGERY    . EYE SURGERY    . IR THORACENTESIS ASP PLEURAL SPACE W/IMG GUIDE  10/08/2018  . LAMINECTOMY  1999  . lower back surgery    . RIGHT/LEFT HEART CATH AND CORONARY ANGIOGRAPHY N/A 04/29/2019   Procedure: RIGHT/LEFT HEART CATH AND CORONARY ANGIOGRAPHY;  Surgeon: Troy Sine, MD;  Location: [facility];  Service: Cardiovascular;  Laterality: N/A;  . TONSILLECTOMY      Family History  Problem Relation Age of Onset  . Hypertension Father   . Diabetes Father   . Testicular cancer Brother   . Melanoma Brother     No Known Allergies  Current Outpatient Medications on File Prior to Visit  Medication Sig Dispense Refill  . albuterol (PROVENTIL) (2.5 MG/3ML) 0.083% nebulizer solution Take 3 mLs (2.5 mg total) by nebulization every 4 (four) hours as needed for wheezing or shortness of breath. 75 mL 12  . albuterol (VENTOLIN HFA) 108 (90 Base) MCG/ACT inhaler Inhale 2 puffs into the lungs every 6 (six) hours as needed for wheezing or shortness of breath. 18 g 11  . aspirin EC 81 MG tablet Take 81 mg by mouth daily.    Marland Kitchen ibuprofen (ADVIL) 800 MG tablet TAKE 1 TABLET BY MOUTH EVERY 8 HOURS AS NEEDED 90 tablet 1  . isosorbide mononitrate (IMDUR) 30 MG 24 hr tablet Take 1 tablet by mouth once daily 90 tablet 3  . metoprolol succinate (TOPROL-XL) 25 MG 24 hr tablet Take 1 tablet (25 mg total) by mouth daily. 90 tablet 3  . OXYGEN 2LPM AS NEEDED PER PT    . SYMBICORT 160-4.5 MCG/ACT inhaler Inhale 2 puffs by mouth twice daily 10.2 g 5  . Tiotropium Bromide Monohydrate (SPIRIVA RESPIMAT) 2.5 MCG/ACT AERS Inhale 2 puffs into the lungs daily. 4 g 0  . furosemide (LASIX) 20 MG tablet Take 1 tablet (20 mg total) by mouth daily. 30 tablet 11  . rosuvastatin (CRESTOR) 10 MG tablet Take 1 tablet (10 mg total) by mouth daily. 90 tablet 3   No current facility-administered medications on file prior to visit.    BP (!) 142/50 (BP Location: Left Arm, Patient Position: Sitting, Cuff  Size: Large)   Pulse 85   Temp 98.2 F (36.8 C) (Oral)   Ht 6' (1.829 m)   Wt 183 lb 12.8 oz (83.4 kg)   SpO2 98%   BMI 24.93 kg/m       Objective:   Physical Exam Vitals and nursing note reviewed.  Constitutional:      Appearance: Normal appearance.  Cardiovascular:     Rate and  Rhythm: Normal rate and regular rhythm.     Pulses: Normal pulses.     Heart sounds: Normal heart sounds.  Pulmonary:     Effort: Pulmonary effort is normal.     Breath sounds: Decreased air movement present. Examination of the right-upper field reveals wheezing and rhonchi. Examination of the left-upper field reveals wheezing and rhonchi. Examination of the right-middle field reveals wheezing and rhonchi. Examination of the left-middle field reveals wheezing and rhonchi. Examination of the right-lower field reveals wheezing and rhonchi. Examination of the left-lower field reveals wheezing and rhonchi. Wheezing and rhonchi present. No rales.  Neurological:     General: No focal deficit present.     Mental Status: He is alert and oriented to person, place, and time.  Psychiatric:        Mood and Affect: Mood normal.        Behavior: Behavior normal.        Thought Content: Thought content normal.        Judgment: Judgment normal.       Assessment & Plan:  1. COPD exacerbation (HCC) - doxycycline (VIBRAMYCIN) 100 MG capsule; Take 1 capsule (100 mg total) by mouth 2 (two) times daily.  Dispense: 20 capsule; Refill: 0 - predniSONE (DELTASONE) 10 MG tablet; 40 mg x 3 days, 20 mg x 3 days, 10 mg x 3 days  Dispense: 21 tablet; Refill: 0 - DG Abd Acute W/Chest; Future - Follow up if no improvement in the next 2-3 days  [name], NP

## 2021-02-21 NOTE — Patient Instructions (Signed)
I think you are having an acute exacerbation of COPD.   I will get an xray of your lungs today   I have sent in an antibiotic and steroids

## 2021-02-26 ENCOUNTER — Telehealth: Payer: Self-pay | Admitting: Adult Health

## 2021-02-26 NOTE — Telephone Encounter (Signed)
Patient is returning the call, please advise. CB is [phone]

## 2021-03-15 DIAGNOSIS — L821 Other seborrheic keratosis: Secondary | ICD-10-CM | POA: Diagnosis not present

## 2021-03-15 DIAGNOSIS — L814 Other melanin hyperpigmentation: Secondary | ICD-10-CM | POA: Diagnosis not present

## 2021-03-15 DIAGNOSIS — L578 Other skin changes due to chronic exposure to nonionizing radiation: Secondary | ICD-10-CM | POA: Diagnosis not present

## 2021-03-15 DIAGNOSIS — L718 Other rosacea: Secondary | ICD-10-CM | POA: Diagnosis not present

## 2021-04-22 ENCOUNTER — Other Ambulatory Visit: Payer: Self-pay | Admitting: Adult Health

## 2021-06-06 ENCOUNTER — Other Ambulatory Visit: Payer: Self-pay | Admitting: Adult Health

## 2021-07-29 ENCOUNTER — Other Ambulatory Visit: Payer: Self-pay | Admitting: Adult Health

## 2021-07-31 ENCOUNTER — Other Ambulatory Visit: Payer: Self-pay | Admitting: Internal Medicine

## 2021-07-31 DIAGNOSIS — J449 Chronic obstructive pulmonary disease, unspecified: Secondary | ICD-10-CM

## 2021-08-05 ENCOUNTER — Telehealth: Payer: Self-pay | Admitting: Internal Medicine

## 2021-08-05 DIAGNOSIS — J449 Chronic obstructive pulmonary disease, unspecified: Secondary | ICD-10-CM

## 2021-08-05 MED ORDER — ALBUTEROL SULFATE HFA 108 (90 BASE) MCG/ACT IN AERS: 2.0000 | INHALATION_SPRAY | Freq: Four times a day (QID) | RESPIRATORY_TRACT | 0 refills | Status: DC | PRN

## 2021-08-05 MED ORDER — SYMBICORT 160-4.5 MCG/ACT IN AERO: 2.0000 | INHALATION_SPRAY | Freq: Two times a day (BID) | RESPIRATORY_TRACT | 0 refills | Status: DC

## 2021-08-05 NOTE — Telephone Encounter (Signed)
I have called the pts wife and she is aware of the 2 meds that have been sent to the pharmacy.  He has a pending appt with ND so no refills were given at this time.  Nothing further is needed.

## 2021-08-16 ENCOUNTER — Ambulatory Visit (INDEPENDENT_AMBULATORY_CARE_PROVIDER_SITE_OTHER): Payer: Medicare Other | Admitting: Internal Medicine

## 2021-08-16 ENCOUNTER — Encounter: Payer: Self-pay | Admitting: Internal Medicine

## 2021-08-16 ENCOUNTER — Other Ambulatory Visit: Payer: Self-pay

## 2021-08-16 VITALS — BP 140/58 | HR 101 | Temp 98.4°F | Ht 70.0 in | Wt 180.4 lb

## 2021-08-16 DIAGNOSIS — J449 Chronic obstructive pulmonary disease, unspecified: Secondary | ICD-10-CM

## 2021-08-16 MED ORDER — BREZTRI AEROSPHERE 160-9-4.8 MCG/ACT IN AERO: 2.0000 | INHALATION_SPRAY | Freq: Two times a day (BID) | RESPIRATORY_TRACT | 11 refills | Status: DC

## 2021-08-16 MED ORDER — BREZTRI AEROSPHERE 160-9-4.8 MCG/ACT IN AERO: 2.0000 | INHALATION_SPRAY | Freq: Two times a day (BID) | RESPIRATORY_TRACT | 0 refills | Status: DC

## 2021-08-16 NOTE — Progress Notes (Signed)
Chase Lloyd    [id number]    29-Sep-1956  Primary Care Physician:Nafziger, Tommi Rumps, NP Date of Appointment: 08/16/2021 Established Patient Visit  Chief complaint:   Chief Complaint  Patient presents with   Follow-up    COPD     HPI: Chase Lloyd is a 65 y.o. man with COPD on home oxygen and AVN of the left hip.    Interval Updates: Here for follow up after almost 2 years. Still dealing with back and hip issues. Ambulating with a new walker. Breathing has been getting worse.  Currently only albuterol prn taking 1-2 times/day. Breathing getting worse off symbicort. No hospitalizations or ED visits. Wanted to restart symbicort.   He is not using his oxygen regularly. Supposed to be on [facility]. Uses when he's out for [place] periods of time. He does have a Psychologist, prison and probation services at home.   I have reviewed the patient's family social and past medical history and updated as appropriate.   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  Used to live in [place] 70+ pack year smoking history, quit 2019  Past Medical History:  Diagnosis Date   Back pain    COPD (chronic obstructive pulmonary disease) (HCC)    DDD (degenerative disc disease), lumbar    Depression    Emphysema of lung ([place])    Empyema ([place])    2016  (was in [place])   GERD (gastroesophageal reflux disease)    Hearing loss of right ear    started 2001.     microphone in right ear ,  and hearing aid in left   Hodgkin's disease ([place]) 1999   Hypotension    Meniere's disease    Pneumonia    Tinnitus     Past Surgical History:  Procedure Laterality Date   BACK SURGERY     CERVICAL FUSION  2016   ELBOW SURGERY     EYE SURGERY     IR THORACENTESIS ASP PLEURAL SPACE W/IMG GUIDE  10/08/2018   LAMINECTOMY  1999   lower back surgery     RIGHT/LEFT HEART CATH AND CORONARY  ANGIOGRAPHY N/A 04/29/2019   Procedure: RIGHT/LEFT HEART CATH AND CORONARY ANGIOGRAPHY;  Surgeon: Troy Sine, MD;  Location: [facility];  Service: Cardiovascular;  Laterality: N/A;   TONSILLECTOMY      Family History  Problem Relation Age of Onset   Hypertension Father    Diabetes Father    Testicular cancer Brother    Melanoma Brother     Social History   Occupational History   Occupation: Retired  Tobacco Use   Smoking status: Former    Packs/day: 1.50    Years: 47.00    Pack years: 70.50    Types: Cigarettes    Quit date: 06/13/2018    Years since quitting: 3.1   Smokeless tobacco: Never  Vaping Use   Vaping Use: Never used  Substance and Sexual Activity   Alcohol use: Yes    Comment: 24 cans in a week   Drug use: No   Sexual activity: Not on file   Physical Exam: Blood pressure (!) 140/58, pulse (!) 101, temperature 98.4 F (36.9 C), temperature source Oral, height 5\' 10"  (1.778 m), weight 180 lb 6.4 oz (81.8 kg), SpO2 93 %.  Gen:  No acute distress, chronically ill appearing Lungs:   Diminished, no wheezes or crackles, able to complete in full sentences. CV:         tachycardic, regular Neuro: normal speech, no focal facial asymmetry   Data Reviewed: Imaging: I have personally reviewed the CT angio performed September 2020 which demonstrates severe centrilobular upper lobe predominant emphysema, no focal pneumonia, no PE. PFTs:   PFT Results Latest Ref Rng & Units 10/22/2018 07/30/2017  FVC-Pre L 2.42 2.45  FVC-Predicted Pre % 50 50  FVC-Post L 2.87 2.90  FVC-Predicted Post % 59 59  Pre FEV1/FVC % % 46 46  Post FEV1/FCV % % 44 48  FEV1-Pre L 1.10 1.13  FEV1-Predicted Pre % 30 31  FEV1-Post L 1.26 1.39  DLCO uncorrected ml/min/mmHg 12.79 13.19  DLCO UNC% % 37 39  DLCO corrected ml/min/mmHg 14.51 14.16  DLCO COR %Predicted % 43 42  DLVA Predicted % 67 59  TLC L 5.86 -  TLC % Predicted % 81 -  RV % Predicted % 129 -   I have  personally reviewed the patient's PFTs and they are notable for very severe emphysema, with an FEV1 of 30% of predicted.  These results are consistent when trended over 2018 in 2019.  Labs:  Lab Results  Component Value Date   WBC 5.2 07/30/2019   HGB 11.3 (L) 07/30/2019   HCT 33.5 (L) 07/30/2019   MCV 100.6 (H) 07/30/2019   PLT 107 (L) 07/30/2019   Lab Results  Component Value Date   NA 139 07/30/2019   K 3.6 07/30/2019   CL 101 07/30/2019   CO2 29 07/30/2019   A1AT within normal range.  Elevated CO2 and ABG with hypercapnia  Immunization status: Immunization History  Administered Date(s) Administered   Fluad Quad(high Dose 65+) 09/02/2019   Influenza,inj,Quad PF,6+ Mos 12/27/2015, 10/11/2018   Pneumococcal Conjugate-13 12/26/2015   Td 01/01/2014   Zoster, Live 08/04/2015     Assessment:  Very Severe Emphysema FEV1 30% of predicted, progressing Chronic Respiratory Failure (with hypoxemia and hypercapnia) on 2LNC Avascular necrosis, left hip Hodgkin's disease status post mantle field radiation, now in remission  Plan/Recommendations: Will start Breztri new start. Continue prn albuterol.  Continue follow up with Lung cancer screening program.  He declines flu shot.    Return to Care: Return in about 6 months (around 02/14/2022).   Lenice Llamas, MD [facility] [place]

## 2021-08-16 NOTE — Patient Instructions (Addendum)
Please schedule follow up scheduled with myself in 6 months.  If my schedule is not open yet, we will contact you with a reminder closer to that time. This can be a video visit.   Start taking Breztri 2 puffs twice a day. Gargle after use.   Take the albuterol rescue inhaler every 4 to 6 hours as needed for wheezing or shortness of breath. You can also take it 15 minutes before exercise or exertional activity. Side effects include heart racing or pounding, jitters or anxiety. If you have a history of an irregular heart rhythm, it can make this worse. Can also give some patients a hard time sleeping.

## 2021-09-24 ENCOUNTER — Other Ambulatory Visit: Payer: Self-pay | Admitting: Adult Health

## 2021-10-02 ENCOUNTER — Other Ambulatory Visit: Payer: Self-pay | Admitting: Adult Health

## 2021-10-07 ENCOUNTER — Other Ambulatory Visit: Payer: Self-pay | Admitting: *Deleted

## 2021-10-07 DIAGNOSIS — Z87891 Personal history of nicotine dependence: Secondary | ICD-10-CM

## 2021-11-05 ENCOUNTER — Ambulatory Visit: Payer: Medicare Other

## 2021-11-12 ENCOUNTER — Ambulatory Visit (INDEPENDENT_AMBULATORY_CARE_PROVIDER_SITE_OTHER): Payer: Medicare Other

## 2021-11-12 ENCOUNTER — Other Ambulatory Visit: Payer: Self-pay

## 2021-11-12 ENCOUNTER — Ambulatory Visit (HOSPITAL_COMMUNITY)
Admission: RE | Admit: 2021-11-12 | Discharge: 2021-11-12 | Disposition: A | Discharge status: Home / Self Care | Payer: Medicare Other | Source: Ambulatory Visit | Attending: Acute Care | Admitting: Acute Care

## 2021-11-12 VITALS — Ht 70.0 in | Wt 180.0 lb

## 2021-11-12 DIAGNOSIS — J439 Emphysema, unspecified: Secondary | ICD-10-CM | POA: Diagnosis not present

## 2021-11-12 DIAGNOSIS — I700 Atherosclerosis of aorta: Secondary | ICD-10-CM | POA: Diagnosis not present

## 2021-11-12 DIAGNOSIS — Z122 Encounter for screening for malignant neoplasm of respiratory organs: Secondary | ICD-10-CM | POA: Diagnosis not present

## 2021-11-12 DIAGNOSIS — Z87891 Personal history of nicotine dependence: Secondary | ICD-10-CM | POA: Diagnosis not present

## 2021-11-12 DIAGNOSIS — Z0000 Encounter for general adult medical examination without abnormal findings: Secondary | ICD-10-CM | POA: Diagnosis not present

## 2021-11-12 NOTE — Progress Notes (Signed)
Subjective:   Chase Lloyd is a 66 y.o. male who presents for Medicare Annual/Subsequent preventive examination.  Review of Systems    No ROS      Objective:    There were no vitals filed for this visit. There is no height or weight on file to calculate BMI.  Advanced Directives 10/23/2020 07/30/2019 04/29/2019 02/14/2019 10/07/2018 10/06/2018 07/18/2018  Does Patient Have a Medical Advance Directive? No No No No No No No  Would patient like information on creating a medical advance directive? No - Patient declined - No - Patient declined - No - Patient declined No - Patient declined No - Patient declined    Current Medications (verified) Outpatient Encounter Medications as of 11/12/2021  Medication Sig   albuterol (PROVENTIL) (2.5 MG/3ML) 0.083% nebulizer solution Take 3 mLs (2.5 mg total) by nebulization every 4 (four) hours as needed for wheezing or shortness of breath. (Patient not taking: Reported on 08/16/2021)   albuterol (VENTOLIN HFA) 108 (90 Base) MCG/ACT inhaler Inhale 2 puffs into the lungs every 6 (six) hours as needed for wheezing or shortness of breath.   aspirin EC 81 MG tablet Take 81 mg by mouth daily.   Budeson-Glycopyrrol-Formoterol (BREZTRI AEROSPHERE) 160-9-4.8 MCG/ACT AERO Inhale 2 puffs into the lungs in the morning and at bedtime.   Budeson-Glycopyrrol-Formoterol (BREZTRI AEROSPHERE) 160-9-4.8 MCG/ACT AERO Inhale 2 puffs into the lungs in the morning and at bedtime.   furosemide (LASIX) 20 MG tablet Take 1 tablet (20 mg total) by mouth daily.   isosorbide mononitrate (IMDUR) 30 MG 24 hr tablet Take 1 tablet by mouth once daily (Patient not taking: Reported on 08/16/2021)   metoprolol succinate (TOPROL-XL) 25 MG 24 hr tablet Take 1 tablet (25 mg total) by mouth daily. (Patient not taking: Reported on 08/16/2021)   OXYGEN 2LPM AS NEEDED PER PT (Patient not taking: Reported on 08/16/2021)   rosuvastatin (CRESTOR) 10 MG tablet Take 1 tablet (10 mg total) by mouth  daily.   No facility-administered encounter medications on file as of 11/12/2021.    Allergies (verified) Patient has no known allergies.   History: Past Medical History:  Diagnosis Date   Back pain    COPD (chronic obstructive pulmonary disease) (HCC)    DDD (degenerative disc disease), lumbar    Depression    Emphysema of lung ([place])    Empyema ([place])    2016  (was in [place])   GERD (gastroesophageal reflux disease)    Hearing loss of right ear    started 2001.     microphone in right ear ,  and hearing aid in left   Hodgkin's disease ([place]) 1999   Hypotension    Meniere's disease    Pneumonia    Tinnitus    Past Surgical History:  Procedure Laterality Date   BACK SURGERY     CERVICAL FUSION  2016   ELBOW SURGERY     EYE SURGERY     IR THORACENTESIS ASP PLEURAL SPACE W/IMG GUIDE  10/08/2018   LAMINECTOMY  1999   lower back surgery     RIGHT/LEFT HEART CATH AND CORONARY ANGIOGRAPHY N/A 04/29/2019   Procedure: RIGHT/LEFT HEART CATH AND CORONARY ANGIOGRAPHY;  Surgeon: Troy Sine, MD;  Location: [facility];  Service: Cardiovascular;  Laterality: N/A;   TONSILLECTOMY     Family History  Problem Relation Age of Onset   Hypertension Father    Diabetes Father    Testicular cancer Brother    Melanoma  Brother    Social History   Socioeconomic History   Marital status: Married    Spouse name: Not on file   Number of children: Not on file   Years of education: Not on file   Highest education level: Not on file  Occupational History   Occupation: Retired  Tobacco Use   Smoking status: Former    Packs/day: 1.50    Years: 47.00    Pack years: 70.50    Types: Cigarettes    Quit date: 06/13/2018    Years since quitting: 3.4   Smokeless tobacco: Never  Vaping Use   Vaping Use: Never used  Substance and Sexual Activity   Alcohol use: Yes    Comment: 24 cans in a week   Drug use: No   Sexual activity: Not on file  Other Topics Concern   Not on file   Social History Narrative   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 No   Activities of Daily Living No flowsheet data found.  Patient Care Team: [name], NP as PCP - General (Family Medicine) Belva Crome, MD as PCP - Cardiology (Cardiology) [facility] (Audiology)  Indicate any recent Medical Services you may have received from other than [facility] providers in the past year (date may be approximate).     Assessment:   This is a routine wellness examination for Chase Lloyd.  Virtual Visit via Telephone Note  I connected with  Chase Lloyd on 11/12/21 at  9:00 AM EST by telephone and verified that I am speaking with the correct person using two identifiers.  Location: Patient: Home Provider: Office Persons participating in the virtual visit: patient/Nurse Health Advisor   I discussed the limitations, risks, security and privacy concerns of performing an evaluation and management service by telephone and the availability of in person appointments. The patient expressed understanding and agreed to proceed.  Interactive audio and video telecommunications were attempted between this nurse and patient, however failed, due to patient having technical difficulties OR patient did not have access to video capability.  We continued and completed visit with audio only.  Some vital signs may be absent or patient reported.   [name], LPN   Hearing/Vision screen No results found.  Dietary issues and exercise activities discussed:     Goals Addressed   None    Depression Screen PHQ 2/9 Scores 10/23/2020 12/14/2018 11/10/2018  PHQ - 2 Score 0 6 6  PHQ- 9 Score - 14 14    Fall Risk Fall Risk   10/23/2020 09/28/2019  Falls in the past year? 0 0  Comment - Emmi Telephone Survey: data to providers prior to load  Number falls in past yr: 0 -  Injury with Fall? 0 -  Risk for fall due to : Impaired vision;Impaired balance/gait -  Risk for fall due to: Comment tinnitus nad vertigo -  Follow up Falls prevention discussed -    FALL RISK PREVENTION PERTAINING TO THE HOME:  Any stairs in or around the home? Yes  If so, are there any without handrails? No  Home free of loose throw rugs in walkways, pet beds, electrical cords, etc?  Yes  Adequate lighting in your home to reduce risk of falls? Yes   ASSISTIVE DEVICES UTILIZED TO PREVENT FALLS:  Life alert? No  Use of a cane, walker or w/c? Yes  Grab bars in the bathroom? Yes  Shower chair or bench in shower? Yes  Elevated toilet seat or a handicapped toilet? No   TIMED UP AND GO:  Was the test performed? No . Audio Visit   Cognitive Function:     6CIT Screen 10/23/2020  What Year? 0 points  What month? 0 points  Count back from 20 0 points  Months in reverse 0 points  Repeat phrase 2 points    Immunizations Immunization History  Administered Date(s) Administered   Fluad Quad(high Dose 65+) 09/02/2019   Influenza,inj,Quad PF,6+ Mos 12/27/2015, 10/11/2018   Pneumococcal Conjugate-13 12/26/2015   Td 01/01/2014   Zoster, Live 08/04/2015   Flu Vaccine status: Due, Education has been provided regarding the importance of this vaccine. Advised may receive this vaccine at local pharmacy or Health Dept. Aware to provide a copy of the vaccination record if obtained from local pharmacy or Health Dept. Verbalized acceptance and understanding.    Pneumococcal vaccine status: Due, Education has been provided regarding the importance of this vaccine. Advised may receive this vaccine at local pharmacy or Health Dept. Aware to provide a copy of the vaccination record if obtained from local pharmacy or Health Dept. Verbalized  acceptance and understanding.  Covid-19 vaccine status: Declined, Education has been provided regarding the importance of this vaccine but patient still declined. Advised may receive this vaccine at local pharmacy or Health Dept.or vaccine clinic. Aware to provide a copy of the vaccination record if obtained from local pharmacy or Health Dept. Verbalized acceptance and understanding.  Qualifies for Shingles Vaccine? Yes   Zostavax completed No   Shingrix Completed?: No.    Education has been provided regarding the importance of this vaccine. Patient has been advised to call insurance company to determine out of pocket expense if they have not yet received this vaccine. Advised may also receive vaccine at local pharmacy or Health Dept. Verbalized acceptance and understanding.  Screening Tests Health Maintenance  Topic Date Due   COVID-19 Vaccine (1) Never done   COLONOSCOPY (Pts 45-64yrs Insurance coverage will need to be confirmed)  Never done   Zoster Vaccines- Shingrix (1 of 2) Never done   Pneumonia Vaccine 29+ Years old (2 - PPSV23 if available, else PCV20) 12/25/2016   INFLUENZA VACCINE  06/03/2021   TETANUS/TDAP  01/02/2024   Hepatitis C Screening  Completed   HPV VACCINES  Aged Out    Health Maintenance  Health Maintenance Due  Topic Date Due   COVID-19 Vaccine (1) Never done   COLONOSCOPY (Pts 45-66yrs Insurance coverage will need to be confirmed)  Never done   Zoster Vaccines- Shingrix (1 of 2) Never done   Pneumonia Vaccine 52+ Years old (2 - PPSV23 if available, else PCV20) 12/25/2016   INFLUENZA VACCINE  06/03/2021     Additional Screening:  Vision Screening: Recommended annual ophthalmology exams for early detection of glaucoma and other disorders of the eye. Is the patient up to date with their annual eye exam?  Yes  Who is the provider or what is the name of the office in which the patient attends annual eye exams? Patient does not recall provider.   Dental  Screening: Recommended annual dental exams for proper oral hygiene  Community Resource Referral / Chronic Care Management:  CRR  required this visit?  No   CCM required this visit?  No      Plan:     I have personally reviewed and noted the following in the patients chart:   Medical and social history Use of alcohol, tobacco or illicit drugs  Current medications and supplements including opioid prescriptions. Patient is not currently taking opioid prescriptions. Functional ability and status Nutritional status Physical activity Advanced directives List of other physicians Hospitalizations, surgeries, and ER visits in previous 12 months Vitals Screenings to include cognitive, depression, and falls Referrals and appointments  In addition, I have reviewed and discussed with patient certain preventive protocols, quality metrics, and best practice recommendations. A written personalized care plan for preventive services as well as general preventive health recommendations were provided to patient.     [name], LPN   [id number]   Nurse Notes:

## 2021-11-12 NOTE — Patient Instructions (Signed)
Mr. Chase Lloyd , Thank you for taking time to come for your Medicare Wellness Visit. I appreciate your ongoing commitment to your health goals. Please review the following plan we discussed and let me know if I can assist you in the future.   These are the goals we discussed:  Goals      Patient Stated     None at this time.        This is a list of the screening recommended for you and due dates:  Health Maintenance  Topic Date Due   COVID-19 Vaccine (1) 11/28/2021*   Flu Shot  01/31/2022*   Zoster (Shingles) Vaccine (1 of 2) 02/10/2022*   Pneumonia Vaccine (2 - PPSV23 if available, else PCV20) 11/12/2022*   Colon Cancer Screening  11/12/2022*   Tetanus Vaccine  01/02/2024   Hepatitis C Screening: USPSTF Recommendation to screen - Ages 18-79 yo.  Completed   HPV Vaccine  Aged Out  *Topic was postponed. The date shown is not the original due date.   Advanced directives: Yes  Conditions/risks identified: None  Next appointment: Follow up in one year for your annual wellness visit.   Preventive Care 52 Years and Older, Male Preventive care refers to lifestyle choices and visits with your health care provider that can promote health and wellness. What does preventive care include? A yearly physical exam. This is also called an annual well check. Dental exams once or twice a year. Routine eye exams. Ask your health care provider how often you should have your eyes checked. Personal lifestyle choices, including: Daily care of your teeth and gums. Regular physical activity. Eating a healthy diet. Avoiding tobacco and drug use. Limiting alcohol use. Practicing safe sex. Taking low doses of aspirin every day. Taking vitamin and mineral supplements as recommended by your health care provider. What happens during an annual well check? The services and screenings done by your health care provider during your annual well check will depend on your age, overall health, lifestyle risk  factors, and family history of disease. Counseling  Your health care provider may ask you questions about your: Alcohol use. Tobacco use. Drug use. Emotional well-being. Home and relationship well-being. Sexual activity. Eating habits. History of falls. Memory and ability to understand (cognition). Work and work Statistician. Screening  You may have the following tests or measurements: Height, weight, and BMI. Blood pressure. Lipid and cholesterol levels. These may be checked every 5 years, or more frequently if you are over 35 years old. Skin check. Lung cancer screening. You may have this screening every year starting at age 68 if you have a 30-pack-year history of smoking and currently smoke or have quit within the past 15 years. Fecal occult blood test (FOBT) of the stool. You may have this test every year starting at age 68. Flexible sigmoidoscopy or colonoscopy. You may have a sigmoidoscopy every 5 years or a colonoscopy every 10 years starting at age 68. Prostate cancer screening. Recommendations will vary depending on your family history and other risks. Hepatitis C blood test. Hepatitis B blood test. Sexually transmitted disease (STD) testing. Diabetes screening. This is done by checking your blood sugar (glucose) after you have not eaten for a while (fasting). You may have this done every 1-3 years. Abdominal aortic aneurysm (AAA) screening. You may need this if you are a current or former smoker. Osteoporosis. You may be screened starting at age 68 if you are at high risk. Talk with your health care provider about  your test results, treatment options, and if necessary, the need for more tests. Vaccines  Your health care provider may recommend certain vaccines, such as: Influenza vaccine. This is recommended every year. Tetanus, diphtheria, and acellular pertussis (Tdap, Td) vaccine. You may need a Td booster every 10 years. Zoster vaccine. You may need this after age  68. Pneumococcal 13-valent conjugate (PCV13) vaccine. One dose is recommended after age 68. Pneumococcal polysaccharide (PPSV23) vaccine. One dose is recommended after age 68. Talk to your health care provider about which screenings and vaccines you need and how often you need them. This information is not intended to replace advice given to you by your health care provider. Make sure you discuss any questions you have with your health care provider. Document Released: 11/16/2015 Document Revised: 07/09/2016 Document Reviewed: 08/21/2015 Elsevier Interactive Patient Education  2017 [place] Prevention in the Home Falls can cause injuries. They can happen to people of all ages. There are many things you can do to make your home safe and to help prevent falls. What can I do on the outside of my home? Regularly fix the edges of walkways and driveways and fix any cracks. Remove anything that might make you trip as you walk through a door, such as a raised step or threshold. Trim any bushes or trees on the path to your home. Use bright outdoor lighting. Clear any walking paths of anything that might make someone trip, such as rocks or tools. Regularly check to see if handrails are loose or broken. Make sure that both sides of any steps have handrails. Any raised decks and porches should have guardrails on the edges. Have any leaves, snow, or ice cleared regularly. Use sand or salt on walking paths during winter. Clean up any spills in your garage right away. This includes oil or grease spills. What can I do in the bathroom? Use night lights. Install grab bars by the toilet and in the tub and shower. Do not use towel bars as grab bars. Use non-skid mats or decals in the tub or shower. If you need to sit down in the shower, use a plastic, non-slip stool. Keep the floor dry. Clean up any water that spills on the floor as soon as it happens. Remove soap buildup in the tub or shower  regularly. Attach bath mats securely with double-sided non-slip rug tape. Do not have throw rugs and other things on the floor that can make you trip. What can I do in the bedroom? Use night lights. Make sure that you have a light by your bed that is easy to reach. Do not use any sheets or blankets that are too big for your bed. They should not hang down onto the floor. Have a firm chair that has side arms. You can use this for support while you get dressed. Do not have throw rugs and other things on the floor that can make you trip. What can I do in the kitchen? Clean up any spills right away. Avoid walking on wet floors. Keep items that you use a lot in easy-to-reach places. If you need to reach something above you, use a strong step stool that has a grab bar. Keep electrical cords out of the way. Do not use floor polish or wax that makes floors slippery. If you must use wax, use non-skid floor wax. Do not have throw rugs and other things on the floor that can make you trip. What can I do with  my stairs? Do not leave any items on the stairs. Make sure that there are handrails on both sides of the stairs and use them. Fix handrails that are broken or loose. Make sure that handrails are as [place] as the stairways. Check any carpeting to make sure that it is firmly attached to the stairs. Fix any carpet that is loose or worn. Avoid having throw rugs at the top or bottom of the stairs. If you do have throw rugs, attach them to the floor with carpet tape. Make sure that you have a light switch at the top of the stairs and the bottom of the stairs. If you do not have them, ask someone to add them for you. What else can I do to help prevent falls? Wear shoes that: Do not have high heels. Have rubber bottoms. Are comfortable and fit you well. Are closed at the toe. Do not wear sandals. If you use a stepladder: Make sure that it is fully opened. Do not climb a closed stepladder. Make sure that  both sides of the stepladder are locked into place. Ask someone to hold it for you, if possible. Clearly mark and make sure that you can see: Any grab bars or handrails. First and last steps. Where the edge of each step is. Use tools that help you move around (mobility aids) if they are needed. These include: Canes. Walkers. Scooters. Crutches. Turn on the lights when you go into a dark area. Replace any light bulbs as soon as they burn out. Set up your furniture so you have a clear path. Avoid moving your furniture around. If any of your floors are uneven, fix them. If there are any pets around you, be aware of where they are. Review your medicines with your doctor. Some medicines can make you feel dizzy. This can increase your chance of falling. Ask your doctor what other things that you can do to help prevent falls. This information is not intended to replace advice given to you by your health care provider. Make sure you discuss any questions you have with your health care provider. Document Released: 08/16/2009 Document Revised: 03/27/2016 Document Reviewed: 11/24/2014 Elsevier Interactive Patient Education  2017 [employer].

## 2021-11-14 ENCOUNTER — Other Ambulatory Visit: Payer: Self-pay | Admitting: Acute Care

## 2021-11-14 DIAGNOSIS — Z87891 Personal history of nicotine dependence: Secondary | ICD-10-CM

## 2021-11-16 ENCOUNTER — Emergency Department (HOSPITAL_BASED_OUTPATIENT_CLINIC_OR_DEPARTMENT_OTHER)
Admission: EM | Admit: 2021-11-16 | Discharge: 2021-11-16 | Disposition: A | Discharge status: Home / Self Care | Payer: Medicare Other | Attending: Emergency Medicine | Admitting: Emergency Medicine

## 2021-11-16 ENCOUNTER — Other Ambulatory Visit: Payer: Self-pay | Admitting: Adult Health

## 2021-11-16 ENCOUNTER — Other Ambulatory Visit: Payer: Self-pay

## 2021-11-16 ENCOUNTER — Emergency Department (HOSPITAL_BASED_OUTPATIENT_CLINIC_OR_DEPARTMENT_OTHER): Payer: Medicare Other | Admitting: Radiology

## 2021-11-16 ENCOUNTER — Encounter (HOSPITAL_BASED_OUTPATIENT_CLINIC_OR_DEPARTMENT_OTHER): Payer: Self-pay | Admitting: Emergency Medicine

## 2021-11-16 DIAGNOSIS — M542 Cervicalgia: Secondary | ICD-10-CM

## 2021-11-16 DIAGNOSIS — M436 Torticollis: Secondary | ICD-10-CM | POA: Insufficient documentation

## 2021-11-16 DIAGNOSIS — R000 Tachycardia, unspecified: Secondary | ICD-10-CM | POA: Insufficient documentation

## 2021-11-16 DIAGNOSIS — J449 Chronic obstructive pulmonary disease, unspecified: Secondary | ICD-10-CM | POA: Insufficient documentation

## 2021-11-16 DIAGNOSIS — Z79899 Other long term (current) drug therapy: Secondary | ICD-10-CM | POA: Insufficient documentation

## 2021-11-16 DIAGNOSIS — Z7982 Long term (current) use of aspirin: Secondary | ICD-10-CM | POA: Diagnosis not present

## 2021-11-16 MED ORDER — KETOROLAC TROMETHAMINE 30 MG/ML IJ SOLN
30.0000 mg | Freq: Once | INTRAMUSCULAR | Status: AC
  Administered 2021-11-16: 30 mg via INTRAMUSCULAR
  Filled 2021-11-16: qty 1

## 2021-11-16 MED ORDER — PREDNISONE 20 MG PO TABS: 40.0000 mg | ORAL_TABLET | Freq: Every day | ORAL | 0 refills | Status: DC

## 2021-11-16 MED ORDER — OXYCODONE-ACETAMINOPHEN 5-325 MG PO TABS: 1.0000 | ORAL_TABLET | Freq: Four times a day (QID) | ORAL | 0 refills | Status: DC | PRN

## 2021-11-16 MED ORDER — DIAZEPAM 5 MG PO TABS
5.0000 mg | ORAL_TABLET | Freq: Once | ORAL | Status: AC
  Administered 2021-11-16: 5 mg via ORAL
  Filled 2021-11-16: qty 1

## 2021-11-16 NOTE — Discharge Instructions (Signed)
The x-rays today show that the hardware is normal.  The CAT scan that you had done this week also looks okay.  We are starting you on a steroid medication which will hopefully help with the pain.  You can also use Voltaren gel and rub it in the area that is painful.  Also heating pads or muscle rubs may help with the muscular pain.  You are also given pain medication that you can take when pain is severe.

## 2021-11-16 NOTE — ED Triage Notes (Signed)
Pt from home c/o of pain in neck and shoulders  205 pt had surgery to his neck. Pt stated that pain in neck started in christmas and has gotten progressively worse. Pt states pain is constant and varies between 6-10 depending on amount of exertion. Pt denis N/V.

## 2021-11-16 NOTE — ED Provider Notes (Signed)
[facility] EMERGENCY DEPT Provider Note   CSN: [id number] Arrival date & time: 11/16/21  0844     History  Chief Complaint  Patient presents with   Neck Injury    Chase Lloyd is a 66 y.o. male.  Patient is a 66 year old male with a history of COPD, GERD, Hodgkin's disease, Mnire's disease and prior history of neck pathology status post surgery x2 with laminectomy and then fusion who is presenting today with complaint of neck pain.  He reports that the neck pain started around Christmas and over the last 3 weeks has just gradually worsened.  It is a little bit in the center of his neck but is more on the sides of his neck.  It is preventing him from sleeping because it is so achy and painful.  It does not radiate down his arms and he does not have any weakness in his arms however when he does lift his arms it seems to make the pain more severe in the neck itself.  He has not had any difficulty walking but cannot turn his head side to side due to the pain.  He has tried some Advil and his wife gave him a methocarbamol yesterday but he reported it did not help at all.  Due to his COPD he has been coughing a lot and he does not know if that may be related to the neck pain.  He does not recall any injury or heavy lifting.  He has not seen anybody about his neck for some time.  The surgeries that were done were done in [place].  The history is provided by the patient, the spouse and medical records.  Neck Injury      Home Medications Prior to Admission medications   Medication Sig Start Date End Date Taking? Authorizing Provider  albuterol (VENTOLIN HFA) 108 (90 Base) MCG/ACT inhaler Inhale 2 puffs into the lungs every 6 (six) hours as needed for wheezing or shortness of breath. 08/05/21  Yes Spero Geralds, MD  oxyCODONE-acetaminophen (PERCOCET/ROXICET) 5-325 MG tablet Take 1 tablet by mouth every 6 (six) hours as needed for severe pain. 11/16/21  Yes Gittel Mccamish,  Loree Fee, MD  predniSONE (DELTASONE) 20 MG tablet Take 2 tablets (40 mg total) by mouth daily. 11/16/21  Yes Adison Jerger, Loree Fee, MD  albuterol (PROVENTIL) (2.5 MG/3ML) 0.083% nebulizer solution Take 3 mLs (2.5 mg total) by nebulization every 4 (four) hours as needed for wheezing or shortness of breath. Patient not taking: Reported on 08/16/2021 07/19/19   Tanda Rockers, MD  aspirin EC 81 MG tablet Take 81 mg by mouth daily.    [provider]  Budeson-Glycopyrrol-Formoterol (BREZTRI AEROSPHERE) 160-9-4.8 MCG/ACT AERO Inhale 2 puffs into the lungs in the morning and at bedtime. 08/16/21   Spero Geralds, MD  Budeson-Glycopyrrol-Formoterol (BREZTRI AEROSPHERE) 160-9-4.8 MCG/ACT AERO Inhale 2 puffs into the lungs in the morning and at bedtime. 08/16/21   Spero Geralds, MD  furosemide (LASIX) 20 MG tablet Take 1 tablet (20 mg total) by mouth daily. 10/12/18 01/04/20  Elgergawy, Silver Huguenin, MD  isosorbide mononitrate (IMDUR) 30 MG 24 hr tablet Take 1 tablet by mouth once daily Patient not taking: Reported on 08/16/2021 01/25/20   Belva Crome, MD  metoprolol succinate (TOPROL-XL) 25 MG 24 hr tablet Take 1 tablet (25 mg total) by mouth daily. Patient not taking: Reported on 08/16/2021 05/13/19   Belva Crome, MD  OXYGEN 2LPM AS NEEDED PER PT Patient not  taking: Reported on 08/16/2021    [provider]  rosuvastatin (CRESTOR) 10 MG tablet Take 1 tablet (10 mg total) by mouth daily. 05/13/19 01/04/20  Belva Crome, MD      Allergies    Patient has no known allergies.    Review of Systems   Review of Systems  Physical Exam Updated Vital Signs BP (!) 158/73 (BP Location: Left Arm)    Pulse 98    Temp 97.7 F (36.5 C) (Oral)    Resp 19    Ht 5\' 11"  (1.803 m)    Wt 77.6 kg    SpO2 96%    BMI 23.85 kg/m  Physical Exam Vitals and nursing note reviewed.  Constitutional:      General: He is not in acute distress.    Appearance: He is well-developed.  HENT:     Head: Normocephalic  and atraumatic.  Eyes:     Conjunctiva/sclera: Conjunctivae normal.     Pupils: Pupils are equal, round, and reactive to light.  Neck:      Comments: Torticollis on exam and patient refuses to move his head side to side.  Minimal to no tenderness with palpation down the middle of the cervical spine but significant tenderness and muscle spasms noted in the paraspinal area.  Trapezial spasm, scalene spasm bilaterally. Cardiovascular:     Rate and Rhythm: Regular rhythm. Tachycardia present.     Pulses: Normal pulses.     Heart sounds: No murmur heard. Pulmonary:     Effort: Pulmonary effort is normal. No respiratory distress.     Breath sounds: Normal breath sounds. No wheezing or rales.  Musculoskeletal:        General: Normal range of motion.     Cervical back: Normal range of motion and neck supple. Tenderness present.  Skin:    General: Skin is warm and dry.     Findings: No erythema or rash.  Neurological:     Mental Status: He is alert and oriented to person, place, and time. Mental status is at baseline.     Sensory: No sensory deficit.     Motor: No weakness.     Comments: Patient is able to lift bilateral shoulders and abduct with 5 out of 5 strength however with adduction of the arms he starts having severe pain and spasm in his neck.  5 out of 5 grip strength  Psychiatric:        Mood and Affect: Mood normal.        Behavior: Behavior normal.    ED Results / Procedures / Treatments   Labs (all labs ordered are listed, but only abnormal results are displayed) Labs Reviewed - No data to display  EKG None  Radiology DG Cervical Spine Complete  Result Date: 11/16/2021 CLINICAL DATA:  66 year old male with history of neck pain. EXAM: CERVICAL SPINE - COMPLETE 4+ VIEW COMPARISON:  No priors. FINDINGS: Multiple views of the cervical spine poorly demonstrate the lower cervical spine (C7 and cervicothoracic junction). With these limitations in mind, there is no definite acute  displaced fracture. Allowing for patient positioning, alignment appears normal. Status post ACDF from C4-C7 with interbody grafts at C4-C5, C5-C6 and C6-C7. Multilevel facet arthropathy is noted bilaterally. IMPRESSION: 1. No acute radiographic abnormality of the cervical spine. 2. Status post ACDF at C4-C7. Electronically Signed   By: Vinnie Langton M.D.   On: 11/16/2021 10:01    Procedures Procedures    Medications Ordered in ED Medications  diazepam (VALIUM) tablet 5 mg (5 mg Oral Given 11/16/21 0947)  ketorolac (TORADOL) 30 MG/ML injection 30 mg (30 mg Intramuscular Given 11/16/21 9735)    ED Course/ Medical Decision Making/ A&P                           Medical Decision Making Amount and/or Complexity of Data Reviewed External Data Reviewed: radiology. Radiology: ordered and independent interpretation performed. Decision-making details documented in ED Course.   Patient is a 66 year old male presenting today with complaint of neck pain and torticollis.  He has prior history of surgery on his neck with hardware present.  Due to his COPD he does cough a lot and he does not know if that because the pain versus sleeping wrong.  This has been building for the last 3 weeks.  He has not seen anybody about this.  On exam patient appears uncomfortable but sats are normal, intermittent tachycardia but significant pain with palpation.  Low suspicion for vertebral dissection.  Concern for possible hardware loosening.  He is neurovascularly intact at this time.  Patient given pain control.  Plain films are pending.  10:37 AM I independently evaluated patient's cervical films.  He does have hardware present.  Radiology reports no acute findings.  No identified fractures and hardware appears intact.  On repeat evaluation patient is still having pain but does appear to be a little more mobile.  Will discharge home with pain control, will start a short course of steroids.  Patient also has ongoing nasal  drip and encouraged him to restart his Flonase.  He had a CT scan done recently of his chest which he gets done every 6 months and there were no acute findings at that time.  He has clear breath sounds today and does not appear to be having a COPD exacerbation.  Patient does have follow-up with his doctor on the 20th and encouraged to follow-up with him but was also given referral to neurosurgery for his neck.         Final Clinical Impression(s) / ED Diagnoses Final diagnoses:  Cervical pain (neck)    Rx / DC Orders ED Discharge Orders          Ordered    predniSONE (DELTASONE) 20 MG tablet  Daily        11/16/21 1035    oxyCODONE-acetaminophen (PERCOCET/ROXICET) 5-325 MG tablet  Every 6 hours PRN        11/16/21 1035              Blanchie Dessert, MD 11/16/21 1037

## 2021-11-16 NOTE — ED Notes (Signed)
Patient transported to X-ray 

## 2021-11-22 ENCOUNTER — Ambulatory Visit (INDEPENDENT_AMBULATORY_CARE_PROVIDER_SITE_OTHER): Payer: Medicare Other | Admitting: Adult Health

## 2021-11-22 ENCOUNTER — Encounter: Payer: Self-pay | Admitting: Adult Health

## 2021-11-22 VITALS — BP 140/60 | HR 100 | Temp 98.6°F | Ht 71.0 in | Wt 180.8 lb

## 2021-11-22 DIAGNOSIS — R1310 Dysphagia, unspecified: Secondary | ICD-10-CM

## 2021-11-22 DIAGNOSIS — Z1211 Encounter for screening for malignant neoplasm of colon: Secondary | ICD-10-CM

## 2021-11-22 DIAGNOSIS — M542 Cervicalgia: Secondary | ICD-10-CM

## 2021-11-22 DIAGNOSIS — M545 Low back pain, unspecified: Secondary | ICD-10-CM

## 2021-11-22 DIAGNOSIS — Z0000 Encounter for general adult medical examination without abnormal findings: Secondary | ICD-10-CM

## 2021-11-22 DIAGNOSIS — I25118 Atherosclerotic heart disease of native coronary artery with other forms of angina pectoris: Secondary | ICD-10-CM

## 2021-11-22 DIAGNOSIS — G8929 Other chronic pain: Secondary | ICD-10-CM

## 2021-11-22 DIAGNOSIS — N400 Enlarged prostate without lower urinary tract symptoms: Secondary | ICD-10-CM | POA: Diagnosis not present

## 2021-11-22 DIAGNOSIS — J449 Chronic obstructive pulmonary disease, unspecified: Secondary | ICD-10-CM

## 2021-11-22 LAB — CBC WITH DIFFERENTIAL/PLATELET
Basophils Absolute: 0 10*3/uL (ref 0.0–0.1)
Basophils Relative: 0.6 % (ref 0.0–3.0)
Eosinophils Absolute: 0.1 10*3/uL (ref 0.0–0.7)
Eosinophils Relative: 0.9 % (ref 0.0–5.0)
HCT: 40.1 % (ref 39.0–52.0)
Hemoglobin: 13.5 g/dL (ref 13.0–17.0)
Lymphocytes Relative: 10.1 % — ABNORMAL LOW (ref 12.0–46.0)
Lymphs Abs: 0.8 10*3/uL (ref 0.7–4.0)
MCHC: 33.6 g/dL (ref 30.0–36.0)
MCV: 92.4 fl (ref 78.0–100.0)
Monocytes Absolute: 0.5 10*3/uL (ref 0.1–1.0)
Monocytes Relative: 5.8 % (ref 3.0–12.0)
Neutro Abs: 6.9 10*3/uL (ref 1.4–7.7)
Neutrophils Relative %: 82.6 % — ABNORMAL HIGH (ref 43.0–77.0)
Platelets: 166 10*3/uL (ref 150.0–400.0)
RBC: 4.34 Mil/uL (ref 4.22–5.81)
RDW: 13.2 % (ref 11.5–15.5)
WBC: 8.3 10*3/uL (ref 4.0–10.5)

## 2021-11-22 LAB — COMPREHENSIVE METABOLIC PANEL
ALT: 31 U/L (ref 0–53)
AST: 23 U/L (ref 0–37)
Albumin: 3.9 g/dL (ref 3.5–5.2)
Alkaline Phosphatase: 72 U/L (ref 39–117)
BUN: 18 mg/dL (ref 6–23)
CO2: 36 mEq/L — ABNORMAL HIGH (ref 19–32)
Calcium: 9 mg/dL (ref 8.4–10.5)
Chloride: 96 mEq/L (ref 96–112)
Creatinine, Ser: 1.17 mg/dL (ref 0.40–1.50)
GFR: 64.63 mL/min (ref >60.00)
Glucose, Bld: 80 mg/dL (ref 70–99)
Potassium: 3.6 mEq/L (ref 3.5–5.1)
Sodium: 137 mEq/L (ref 135–145)
Total Bilirubin: 0.8 mg/dL (ref 0.2–1.2)
Total Protein: 6.8 g/dL (ref 6.0–8.3)

## 2021-11-22 LAB — LIPID PANEL
Cholesterol: 173 mg/dL (ref 0–200)
HDL: 53.2 mg/dL (ref >39.00)
LDL Cholesterol: 81 mg/dL (ref 0–99)
NonHDL: 119.95
Total CHOL/HDL Ratio: 3
Triglycerides: 196 mg/dL — ABNORMAL HIGH (ref 0.0–149.0)
VLDL: 39.2 mg/dL (ref 0.0–40.0)

## 2021-11-22 LAB — PSA: PSA: 0.52 ng/mL (ref 0.10–4.00)

## 2021-11-22 MED ORDER — IBUPROFEN 800 MG PO TABS: 800.0000 mg | ORAL_TABLET | Freq: Three times a day (TID) | ORAL | 1 refills | Status: DC | PRN

## 2021-11-22 NOTE — Patient Instructions (Signed)
I will refer you to Gastroenterology and Neurosurgery - they will call you to schedule your appointments  I will follow up with you regarding your blood work   Start taking muxinex and using flonase

## 2021-11-22 NOTE — Progress Notes (Signed)
Subjective:    Patient ID: Chase Lloyd, male    DOB: Mar 20, 1956, 66 y.o.   MRN: [id number]  HPI Patient presents for yearly preventative medicine examination. He is a pleasant 66 year old male who  has a past medical history of Back pain, COPD (chronic obstructive pulmonary disease) ([place]), DDD (degenerative disc disease), lumbar, Depression, Emphysema of lung ([place]), Empyema ([place]), GERD (gastroesophageal reflux disease), Hearing loss of right ear, Hodgkin's disease ([place]) (1999), Hypotension, Meniere's disease, Pneumonia, and Tinnitus.  COPD with emphysema-is managed by pulmonary.  Started on Francis in October 2022.  Also has albuterol inhaler.  Does wear 2 L nasal cannula but only wears if he is out for [place] periods of time.  CAD-cath in 2020 which showed moderate coronary disease, cardiology felt best treated with secondary prevention and anti-ischemic therapy.  He was started on metoprolol 25 mg and isosorbide 30mg  as well as Crestor 10 mg daily. Has not been seen by Cardiology since 2020. Has not been taking his prescribed medication due to running out. Denies CP Lab Results  Component Value Date   CHOL 136 07/01/2019   HDL 47 07/01/2019   LDLCALC 66 07/01/2019   TRIG 116 07/01/2019   CHOLHDL 2.9 07/01/2019   Chronic Back/Hip Pain -history of cervical laminectomy and fusion while living in [place] and posterior lumbar body fusion done by Dr. Kathyrn Sheriff in 2019.  Seen at urgent care about 6 days ago for cervical spine pain.  X-ray showed hardware intact.  No acute findings.  He was discharged home with a short course of steroids and narcotic pain medication.  He was advised to follow-up with neurosurgery for further evaluation.  Dysphagia -ongoing issue, feels choked up when eating, often spit up pieces of food and phlegm.  Has chronic cough likely from emphysema  BPH - Asymptomatic   All immunizations and health maintenance protocols were reviewed with the patient and needed orders were  placed.  Appropriate screening laboratory values were ordered for the patient including screening of hyperlipidemia, renal function and hepatic function. If indicated by BPH, a PSA was ordered.  Medication reconciliation,  past medical history, social history, problem list and allergies were reviewed in detail with the patient  Goals were established with regard to weight loss, exercise, and  diet in compliance with medications  He is due for routine colon cancer screening    Review of Systems  Constitutional: Negative.   HENT:  Positive for postnasal drip and trouble swallowing.   Eyes: Negative.   Respiratory:  Positive for cough, choking and shortness of breath.   Cardiovascular: Negative.   Gastrointestinal: Negative.   Endocrine: Negative.   Genitourinary: Negative.   Musculoskeletal:  Positive for arthralgias, back pain and gait problem.  Allergic/Immunologic: Negative.   Hematological: Negative.   Psychiatric/Behavioral: Negative.    Past Medical History:  Diagnosis Date   Back pain    COPD (chronic obstructive pulmonary disease) ([facility])    DDD (degenerative disc disease), lumbar    Depression    Emphysema of lung ([place])    Empyema ([place])    2016  (was in [place])   GERD (gastroesophageal reflux disease)    Hearing loss of right ear    started 2001.     microphone in right ear ,  and hearing aid in left   Hodgkin's disease ([place]) 1999   Hypotension    Meniere's disease    Pneumonia    Tinnitus     Social History  Socioeconomic History   Marital status: Married    Spouse name: Not on file   Number of children: Not on file   Years of education: Not on file   Highest education level: Not on file  Occupational History   Occupation: Retired  Tobacco Use   Smoking status: Former    Packs/day: 1.50    Years: 47.00    Pack years: 70.50    Types: Cigarettes    Quit date: 06/13/2018    Years since quitting: 3.4   Smokeless tobacco: Never  Vaping Use   Vaping  Use: Never used  Substance and Sexual Activity   Alcohol use: Yes    Comment: 24 cans in a week   Drug use: No   Sexual activity: Not on file  Other Topics Concern   Not on file  Social History Narrative   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 Strain: Low Risk    Difficulty of Paying Living Expenses: Not hard at all  Food Insecurity: No Food Insecurity   Worried About Charity fundraiser in the Last Year: Never true   Arboriculturist in the Last Year: Never true  Transportation Needs: No Transportation Needs   Lack of Transportation (Medical): No   Lack of Transportation (Non-Medical): No  Physical Activity: Inactive   Days of Exercise per Week: 0 days   Minutes of Exercise per Session: 0 min  Stress: No Stress Concern Present   Feeling of Stress : Not at all  Social Connections: Moderately Isolated   Frequency of Communication with Friends and Family: More than three times a week   Frequency of Social Gatherings with Friends and Family: More than three times a week   Attends Religious Services: Never   Marine scientist or Organizations: No   Attends Archivist Meetings: Never   Marital Status: Married  Human resources officer Violence: Not At Risk   Fear of Current or Ex-Partner: No   Emotionally Abused: No   Physically Abused: No   Sexually Abused: No    Past Surgical History:  Procedure Laterality Date   BACK SURGERY     CERVICAL FUSION  2016   ELBOW SURGERY     EYE SURGERY     IR THORACENTESIS ASP PLEURAL SPACE W/IMG GUIDE  10/08/2018   LAMINECTOMY  1999   lower back surgery     RIGHT/LEFT HEART CATH AND CORONARY ANGIOGRAPHY N/A 04/29/2019   Procedure: RIGHT/LEFT HEART CATH AND CORONARY ANGIOGRAPHY;  Surgeon: Troy Sine, MD;  Location: [facility];  Service: Cardiovascular;  Laterality: N/A;   TONSILLECTOMY      Family History  Problem Relation Age of Onset   Hypertension Father     Diabetes Father    Testicular cancer Brother    Melanoma Brother     No Known Allergies  Current Outpatient Medications on File Prior to Visit  Medication Sig Dispense Refill   albuterol (PROVENTIL) (2.5 MG/3ML) 0.083% nebulizer solution Take 3 mLs (2.5 mg total) by nebulization every 4 (four) hours as needed for wheezing or shortness of breath. 75 mL 12   albuterol (VENTOLIN HFA) 108 (90 Base) MCG/ACT inhaler Inhale 2 puffs into the lungs every 6 (six) hours as needed for wheezing or shortness of breath. 8 g 0   aspirin EC 81 MG tablet Take 81 mg by mouth daily.     Budeson-Glycopyrrol-Formoterol (BREZTRI  AEROSPHERE) 160-9-4.8 MCG/ACT AERO Inhale 2 puffs into the lungs in the morning and at bedtime. 10.7 g 11   Budeson-Glycopyrrol-Formoterol (BREZTRI AEROSPHERE) 160-9-4.8 MCG/ACT AERO Inhale 2 puffs into the lungs in the morning and at bedtime. 5.9 g 0   isosorbide mononitrate (IMDUR) 30 MG 24 hr tablet Take 1 tablet by mouth once daily 90 tablet 3   metoprolol succinate (TOPROL-XL) 25 MG 24 hr tablet Take 1 tablet (25 mg total) by mouth daily. 90 tablet 3   oxyCODONE-acetaminophen (PERCOCET/ROXICET) 5-325 MG tablet Take 1 tablet by mouth every 6 (six) hours as needed for severe pain. 15 tablet 0   OXYGEN 2LPM AS NEEDED PER PT     predniSONE (DELTASONE) 20 MG tablet Take 2 tablets (40 mg total) by mouth daily. 10 tablet 0   furosemide (LASIX) 20 MG tablet Take 1 tablet (20 mg total) by mouth daily. 30 tablet 11   rosuvastatin (CRESTOR) 10 MG tablet Take 1 tablet (10 mg total) by mouth daily. 90 tablet 3   No current facility-administered medications on file prior to visit.    BP 140/60 (BP Location: Left Arm, Patient Position: Sitting, Cuff Size: Normal)    Pulse 100    Temp 98.6 F (37 C) (Oral)    Ht 5\' 11"  (1.803 m)    Wt 180 lb 12.8 oz (82 kg)    SpO2 95%    BMI 25.22 kg/m       Objective:   Physical Exam Vitals and nursing note reviewed.  Constitutional:      Appearance:  Normal appearance.  HENT:     Head: Normocephalic and atraumatic.     Right Ear: Tympanic membrane, ear canal and external ear normal. There is no impacted cerumen.     Left Ear: Tympanic membrane, ear canal and external ear normal. There is no impacted cerumen.     Nose: Nose normal. No congestion or rhinorrhea.     Mouth/Throat:     Mouth: Mucous membranes are moist.     Pharynx: Oropharynx is clear.  Eyes:     Extraocular Movements: Extraocular movements intact.     Conjunctiva/sclera: Conjunctivae normal.     Pupils: Pupils are equal, round, and reactive to light.  Cardiovascular:     Rate and Rhythm: Normal rate and regular rhythm.     Pulses: Normal pulses.     Heart sounds: Normal heart sounds.  Pulmonary:     Effort: Pulmonary effort is normal.     Breath sounds: Normal breath sounds.  Musculoskeletal:     Cervical back: Torticollis, tenderness and bony tenderness present. Pain with movement present.     Lumbar back: Bony tenderness present. Decreased range of motion.  Skin:    General: Skin is warm and dry.  Neurological:     General: No focal deficit present.     Mental Status: He is alert and oriented to person, place, and time.     Gait: Gait abnormal (walking with upright walker).  Psychiatric:        Mood and Affect: Mood normal.        Behavior: Behavior normal.        Thought Content: Thought content normal.        Judgment: Judgment normal.      Assessment & Plan:  1. Routine general medical examination at a health care facility  - CBC with Differential/Platelet; Future - Comprehensive metabolic panel; Future - Lipid panel; Future - Lipid panel - Comprehensive metabolic panel -  CBC with Differential/Platelet  2. Colon cancer screening  - Ambulatory referral to Gastroenterology  3. COPD GOLD III  - continue with plan of care by Cardiology   4. Coronary artery disease involving native coronary artery of native heart with other form of angina  pectoris ([place]) - Encouraged to follow up with Cardiology. He does not want to go back and see them at this time.  - CBC with Differential/Platelet; Future - Comprehensive metabolic panel; Future - Lipid panel; Future - Lipid panel - Comprehensive metabolic panel - CBC with Differential/Platelet  5. Benign prostatic hyperplasia without lower urinary tract symptoms  - PSA; Future - PSA  6. Dysphagia, unspecified type  - Ambulatory referral to Gastroenterology  7. Chronic bilateral low back pain without sciatica  - Ambulatory referral to Neurosurgery  8. Cervical spine pain  - Ambulatory referral to Neurosurgery  [name], NP

## 2021-11-25 ENCOUNTER — Telehealth (INDEPENDENT_AMBULATORY_CARE_PROVIDER_SITE_OTHER): Payer: Medicare Other | Admitting: Family Medicine

## 2021-11-25 ENCOUNTER — Encounter: Payer: Self-pay | Admitting: Family Medicine

## 2021-11-25 DIAGNOSIS — R590 Localized enlarged lymph nodes: Secondary | ICD-10-CM

## 2021-11-25 MED ORDER — AMOXICILLIN-POT CLAVULANATE 875-125 MG PO TABS: 1.0000 | ORAL_TABLET | Freq: Two times a day (BID) | ORAL | 0 refills | Status: DC

## 2021-11-25 NOTE — Progress Notes (Addendum)
MyChart Video Visit    Virtual Visit via Video Note   This visit type was conducted due to national recommendations for restrictions regarding the COVID-19 Pandemic (e.g. social distancing) in an effort to limit this patient's exposure and mitigate transmission in our community. This patient is at least at moderate risk for complications without adequate follow up. This format is felt to be most appropriate for this patient at this time. Physical exam was limited by quality of the video and audio technology used for the visit. CMA was able to get the patient set up on a video visit.  Patient location: Home. Patient and provider in visit Provider location: Office  I discussed the limitations of evaluation and management by telemedicine and the availability of in person appointments. The patient expressed understanding and agreed to proceed.  Visit Date: 11/25/2021  Today's healthcare provider: Wellington Hampshire, MD     Subjective:    Patient ID: Chase Lloyd, male    DOB: [birth year], 66 y.o.   MRN: [id number]  Chief Complaint  Patient presents with   Swollen glands    Started a couple of days ago     This was a true telephone visit.  Just picked wrong template -telephone visit.  Wife present. Was just seen for cpx last wk-not present then. Swollen glands for 2-3 days-L side of neck.  Tender. No f/c. No sore throat.  L ear tender some.  Hodgkins lymphoma 1999 R side of neck. Hurts to swallow. Hurts to open mouth.  Per wife (on phone), not really red.  No white spots. Nor really sick.     Per pt-not taking lasix, imdur, toprol, pred,crestor but wants it left on list   Past Medical History:  Diagnosis Date   Back pain    COPD (chronic obstructive pulmonary disease) (HCC)    DDD (degenerative disc disease), lumbar    Depression    Emphysema of lung ([place])    Empyema ([place])    2016  (was in [place])   GERD (gastroesophageal reflux disease)    Hearing loss of right ear     started 2001.     microphone in right ear ,  and hearing aid in left   Hodgkin's disease ([place]) 1999   Hypotension    Meniere's disease    Pneumonia    Tinnitus     Past Surgical History:  Procedure Laterality Date   BACK SURGERY     CERVICAL FUSION  2016   ELBOW SURGERY     EYE SURGERY     IR THORACENTESIS ASP PLEURAL SPACE W/IMG GUIDE  10/08/2018   LAMINECTOMY  1999   lower back surgery     RIGHT/LEFT HEART CATH AND CORONARY ANGIOGRAPHY N/A 04/29/2019   Procedure: RIGHT/LEFT HEART CATH AND CORONARY ANGIOGRAPHY;  Surgeon: Troy Sine, MD;  Location: [facility];  Service: Cardiovascular;  Laterality: N/A;   TONSILLECTOMY      Outpatient Medications Prior to Visit  Medication Sig Dispense Refill   albuterol (PROVENTIL) (2.5 MG/3ML) 0.083% nebulizer solution Take 3 mLs (2.5 mg total) by nebulization every 4 (four) hours as needed for wheezing or shortness of breath. 75 mL 12   albuterol (VENTOLIN HFA) 108 (90 Base) MCG/ACT inhaler Inhale 2 puffs into the lungs every 6 (six) hours as needed for wheezing or shortness of breath. 8 g 0   aspirin EC 81 MG tablet Take 81 mg by mouth daily.     Budeson-Glycopyrrol-Formoterol (  BREZTRI AEROSPHERE) 160-9-4.8 MCG/ACT AERO Inhale 2 puffs into the lungs in the morning and at bedtime. 10.7 g 11   Budeson-Glycopyrrol-Formoterol (BREZTRI AEROSPHERE) 160-9-4.8 MCG/ACT AERO Inhale 2 puffs into the lungs in the morning and at bedtime. 5.9 g 0   ibuprofen (ADVIL) 800 MG tablet Take 1 tablet (800 mg total) by mouth every 8 (eight) hours as needed. 270 tablet 1   isosorbide mononitrate (IMDUR) 30 MG 24 hr tablet Take 1 tablet by mouth once daily 90 tablet 3   metoprolol succinate (TOPROL-XL) 25 MG 24 hr tablet Take 1 tablet (25 mg total) by mouth daily. 90 tablet 3   omeprazole (PRILOSEC) 40 MG capsule Take 40 mg by mouth daily. As needed     oxyCODONE-acetaminophen (PERCOCET/ROXICET) 5-325 MG tablet Take 1 tablet by mouth every 6 (six) hours as  needed for severe pain. 15 tablet 0   OXYGEN 2LPM AS NEEDED PER PT     predniSONE (DELTASONE) 20 MG tablet Take 2 tablets (40 mg total) by mouth daily. 10 tablet 0   furosemide (LASIX) 20 MG tablet Take 1 tablet (20 mg total) by mouth daily. 30 tablet 11   rosuvastatin (CRESTOR) 10 MG tablet Take 1 tablet (10 mg total) by mouth daily. 90 tablet 3   No facility-administered medications prior to visit.    No Known Allergies      Objective:     Physical Exam Vitals and nursing note reviewed.  Constitutional:      General:  is not in acute distress.    Pulmonary:     Effort: No respiratory distress.  Neurological:     Mental Status: Pt is alert and oriented to person, place, and time.  Psychiatric:        Mood and Affect: Mood normal.   Reviewed note and labs from 1/20  There were no vitals taken for this visit.  Wt Readings from Last 3 Encounters:  11/22/21 180 lb 12.8 oz (82 kg)  11/16/21 171 lb (77.6 kg)  11/12/21 180 lb (81.6 kg)       Assessment & Plan:   Problem List Items Addressed This Visit   None Visit Diagnoses     Lymphadenopathy of left cervical region    -  Primary      Lymphadenopathy L side-sudden onset and tender-will do abx.  Labs from last wk, unremarkable.  If worse, not resolving, needs to be seen-has h/o hodgkin's.  Also, other etiologies possible.  Voiced understanding  Meds ordered this encounter  Medications   amoxicillin-clavulanate (AUGMENTIN) 875-125 MG tablet    Sig: Take 1 tablet by mouth 2 (two) times daily.    Dispense:  20 tablet    Refill:  0    I discussed the assessment and treatment plan with the patient. The patient was provided an opportunity to ask questions and all were answered. The patient agreed with the plan and demonstrated an understanding of the instructions.   The patient was advised to call back or seek an in-person evaluation if the symptoms worsen or if the condition fails to improve as anticipated.  I  provided 15 minutes of face-to-face time during this encounter.   Wellington Hampshire, MD [place] [phone] (phone) [phone] (fax)  [place]

## 2021-11-25 NOTE — Patient Instructions (Signed)
Meds have been sent the the pharmacy °You can take tylenol for pain/fevers °If worsening symptoms, let us know or go to the Emergency room  ° ° °

## 2021-11-26 ENCOUNTER — Other Ambulatory Visit: Payer: Self-pay | Admitting: Adult Health

## 2021-11-26 MED ORDER — METOPROLOL SUCCINATE ER 25 MG PO TB24: 25.0000 mg | ORAL_TABLET | Freq: Every day | ORAL | 3 refills | Status: DC

## 2021-11-26 MED ORDER — ISOSORBIDE MONONITRATE ER 30 MG PO TB24: 30.0000 mg | ORAL_TABLET | Freq: Every day | ORAL | 3 refills | Status: DC

## 2021-11-26 MED ORDER — ROSUVASTATIN CALCIUM 10 MG PO TABS: 10.0000 mg | ORAL_TABLET | Freq: Every day | ORAL | 3 refills | Status: DC

## 2021-12-25 ENCOUNTER — Encounter: Payer: Self-pay | Admitting: Adult Health

## 2021-12-31 ENCOUNTER — Encounter (INDEPENDENT_AMBULATORY_CARE_PROVIDER_SITE_OTHER): Payer: Medicare Other | Admitting: Ophthalmology

## 2022-02-20 DIAGNOSIS — Z20822 Contact with and (suspected) exposure to covid-19: Secondary | ICD-10-CM | POA: Diagnosis not present

## 2022-04-28 ENCOUNTER — Telehealth: Payer: Self-pay | Admitting: Adult Health

## 2022-04-28 DIAGNOSIS — M545 Low back pain, unspecified: Secondary | ICD-10-CM

## 2022-04-28 NOTE — Telephone Encounter (Signed)
Patient's wife called because patient would like a second opinion on hip replacement. Wife states that pulmonologist didn't think hip replacement was a good idea, but patient's hip is getting worse. They would now like a referral to Dr.Yates at [facility] in [place]. I let patient know that Kandee Keen was not in today and to call back at the end of this week to check on referral status.    Please advise

## 2022-04-30 NOTE — Telephone Encounter (Signed)
Referral has been entered.

## 2022-05-20 ENCOUNTER — Other Ambulatory Visit: Payer: Self-pay | Admitting: Adult Health

## 2022-06-10 ENCOUNTER — Ambulatory Visit: Payer: Medicare Other

## 2022-06-10 ENCOUNTER — Ambulatory Visit (INDEPENDENT_AMBULATORY_CARE_PROVIDER_SITE_OTHER): Payer: Medicare Other | Admitting: Orthopaedic Surgery

## 2022-06-10 DIAGNOSIS — M545 Low back pain, unspecified: Secondary | ICD-10-CM

## 2022-06-10 DIAGNOSIS — I25118 Atherosclerotic heart disease of native coronary artery with other forms of angina pectoris: Secondary | ICD-10-CM | POA: Diagnosis not present

## 2022-06-10 DIAGNOSIS — M48061 Spinal stenosis, lumbar region without neurogenic claudication: Secondary | ICD-10-CM | POA: Diagnosis not present

## 2022-06-10 DIAGNOSIS — M25552 Pain in left hip: Secondary | ICD-10-CM | POA: Diagnosis not present

## 2022-06-10 DIAGNOSIS — Z981 Arthrodesis status: Secondary | ICD-10-CM | POA: Diagnosis not present

## 2022-06-10 NOTE — Progress Notes (Signed)
Office Visit Note   Patient: Chase Lloyd           Date of Birth: 1956-07-04           MRN: [id number] Visit Date: 06/10/2022              Requested by: [name], NP [address],  [place] [id number] PCP: [name], NP   Assessment & Plan: Visit Diagnoses:  1. Low back pain, unspecified back pain laterality, unspecified chronicity, unspecified whether sciatica present   2. Pain of left hip     Plan: Will proceed with MRI scan for evaluation.  We reviewed his postop films in 2019.  He may have developed some progressive stenosis above his fusion.  No clonus is present.  He will continue with his stand-up walker and can return after his MRI scan.  Follow-Up Instructions: No follow-ups on file.   Orders:  Orders Placed This Encounter  Procedures   XR Lumbar Spine 2-3 Views   XR HIP UNILAT W OR W/O PELVIS 2-3 VIEWS LEFT   MR Lumbar Spine w/o contrast   No orders of the defined types were placed in this encounter.     Procedures: No procedures performed   Clinical Data: No additional findings.   Subjective: Chief Complaint  Patient presents with   Lower Back - Pain   Left Hip - Pain    HPI 66 year old male new patient visit here for an opinion concerning his back.  He had previous surgery by Dr. Kathyrn Sheriff in 2019 with posterior fusion L3-4, L4-5 for some anterolisthesis and lateral recess narrowing per 07/09/2017 MRI scan.  Postop increased pain fluid collection.  Patient was using a standing walker with strap before surgery and still using 1 afterwards.  He had a history of COPD [place]-term cigarette use not smoking now history of empyema COPD Gold 3, chronic oxygen use on portable oxygen currently.  He states he has back pain and hip pain worse hip pain on the left than right.  Wife is present with him today and they relate that at 1 point repeat surgery was considered based on the predominant CSF signal fluid at the posterior cauda equina with  mass effect at upper L1 to lower L2 but no surgery was performed.  This was possibly extra arachnoid hygroma or CSF leak.  The patient states he thinks he is a little bit gradually getting worse he is limited with his dyspnea.  He can walk across the exam room and back without the walker but states if he walks a lot more than he has done for the day and has to sit and rest.  Patient is standing walker chest strap and reverse seat.  Patient and wife say they   were told  he had avascular process of the hips where the hip was likely to collapse was dark on MRI scan.  Unable to find any MRIs of the hip.  X-rays today demonstrate normal hip joints without evidence of AVN changes.  Previous posterior decompression cervical with laminectomy C3-C6 and three-level cervical fusion C3-C6 anteriorly with cages plate and screws.  Review of Systems patient had previous surgery cervical fusion coronary disease eye surgery hypertension.   Objective: Vital Signs: There were no vitals taken for this visit.  Physical Exam Constitutional:      Appearance: He is well-developed.  HENT:     Head: Normocephalic and atraumatic.     Right Ear: External ear normal.  Left Ear: External ear normal.  Eyes:     Pupils: Pupils are equal, round, and reactive to light.  Neck:     Thyroid: No thyromegaly.     Trachea: No tracheal deviation.  Cardiovascular:     Rate and Rhythm: Normal rate.  Pulmonary:     Effort: Pulmonary effort is normal.     Breath sounds: No wheezing.  Abdominal:     General: Bowel sounds are normal.     Palpations: Abdomen is soft.  Musculoskeletal:     Cervical back: Neck supple.  Skin:    General: Skin is warm and dry.     Capillary Refill: Capillary refill takes less than 2 seconds.  Neurological:     Mental Status: He is alert and oriented to person, place, and time.  Psychiatric:        Behavior: Behavior normal.        Thought Content: Thought content normal.        Judgment:  Judgment normal.     Ortho Exam negative logroll right and left hips.  No hip flexion contracture and he does have dorsiflexion plantarflexion of both feet.  Specialty Comments:  No specialty comments available.  Imaging: No results found.   PMFS History: Patient Active Problem List   Diagnosis Date Noted   Choroidal nevus of left eye 06/11/2020   Nuclear sclerotic cataract of left eye 06/11/2020   Nuclear sclerotic cataract of right eye 06/11/2020   Allergic rhinitis 04/25/2020   Abnormal CT scan, chest 08/02/2019   Chronic respiratory failure with hypoxia and hypercapnia ([place]) 07/20/2019   Abnormal cardiac CT angiography    CAD (coronary artery disease) 01/06/2019   Chronic respiratory failure with hypoxia ([place]) 12/04/2018   COPD GOLD III  10/23/2018   Mixed restrictive and obstructive lung disease ([place]) 10/16/2018   Dyspnea on exertion 10/08/2018   Hypertensive urgency 10/07/2018   Troponin I above reference range 10/07/2018   Acute respiratory distress 10/06/2018   Essential hypertension 09/22/2018   Low back pain 07/18/2018   Traumatic subdural hematoma of neuraxis ([place]) 07/18/2018   Lumbar spinal stenosis 07/16/2018   GERD (gastroesophageal reflux disease) 04/26/2018   Intermittent claudication ([place]) 06/12/2017   Lumbar spondylosis 06/12/2017   COPD with acute exacerbation ([place]) 05/29/2017   Tendinopathy of left gluteus medius 05/01/2017   Former cigarette smoker - 70 pack year hx 05/01/2017   Past Medical History:  Diagnosis Date   Back pain    COPD (chronic obstructive pulmonary disease) (HCC)    DDD (degenerative disc disease), lumbar    Depression    Emphysema of lung ([place])    Empyema ([place])    2016  (was in [place])   GERD (gastroesophageal reflux disease)    Hearing loss of right ear    started 2001.     microphone in right ear ,  and hearing aid in left   Hodgkin's disease ([place]) 1999   Hypotension    Meniere's disease    Pneumonia    Tinnitus      Family History  Problem Relation Age of Onset   Hypertension Father    Diabetes Father    Testicular cancer Brother    Melanoma Brother     Past Surgical History:  Procedure Laterality Date   BACK SURGERY     CERVICAL FUSION  2016   ELBOW SURGERY     EYE SURGERY     IR THORACENTESIS ASP PLEURAL SPACE W/IMG GUIDE  10/08/2018  LAMINECTOMY  1999   lower back surgery     RIGHT/LEFT HEART CATH AND CORONARY ANGIOGRAPHY N/A 04/29/2019   Procedure: RIGHT/LEFT HEART CATH AND CORONARY ANGIOGRAPHY;  Surgeon: Troy Sine, MD;  Location: [facility];  Service: Cardiovascular;  Laterality: N/A;   TONSILLECTOMY     Social History   Occupational History   Occupation: Retired  Tobacco Use   Smoking status: Former    Packs/day: 1.50    Years: 47.00    Total pack years: 70.50    Types: Cigarettes    Quit date: 06/13/2018    Years since quitting: 3.9   Smokeless tobacco: Never  Vaping Use   Vaping Use: Never used  Substance and Sexual Activity   Alcohol use: Yes    Comment: 24 cans in a week   Drug use: No   Sexual activity: Not on file

## 2022-06-17 ENCOUNTER — Telehealth: Payer: Self-pay | Admitting: Orthopaedic Surgery

## 2022-06-17 NOTE — Telephone Encounter (Signed)
Called pt and pt informed us that he will call back tomorrow to set MRI review appt after 8/21. Pt states he need to find out wife schedule to set appt

## 2022-06-23 ENCOUNTER — Ambulatory Visit
Admission: RE | Admit: 2022-06-23 | Discharge: 2022-06-23 | Disposition: A | Discharge status: Home / Self Care | Payer: Medicare Other | Source: Ambulatory Visit | Attending: Orthopaedic Surgery | Admitting: Orthopaedic Surgery

## 2022-06-23 DIAGNOSIS — M25552 Pain in left hip: Secondary | ICD-10-CM

## 2022-06-23 DIAGNOSIS — M545 Low back pain, unspecified: Secondary | ICD-10-CM

## 2022-06-23 DIAGNOSIS — M48061 Spinal stenosis, lumbar region without neurogenic claudication: Secondary | ICD-10-CM | POA: Diagnosis not present

## 2022-07-14 ENCOUNTER — Telehealth: Payer: Self-pay | Admitting: Adult Health

## 2022-07-14 NOTE — Telephone Encounter (Signed)
Pt wife called to see who pt was referred to for his stent placement for his arteries. Per encounter notes in Referral section he was referred to Dr. Daneen Schick at [facility] on 01/2019. Relayed info to pt wife and provided the number.   Pt wanted to see if he can call them so he can get a checkup on the stents.  FYI.

## 2022-07-15 ENCOUNTER — Ambulatory Visit: Payer: Medicare Other | Admitting: Orthopaedic Surgery

## 2022-07-16 NOTE — Telephone Encounter (Signed)
Called pt spouse to follow up and she stated that she was able to contact that office and pt is scheduled.

## 2022-07-21 ENCOUNTER — Telehealth: Payer: Self-pay | Admitting: Adult Health

## 2022-07-21 NOTE — Telephone Encounter (Signed)
Per pt wife the referral for [facility] has expired. Pt needs a colonoscopy and endoscopy per pt wife

## 2022-07-24 ENCOUNTER — Encounter: Payer: Self-pay | Admitting: Internal Medicine

## 2022-07-24 ENCOUNTER — Telehealth: Payer: Self-pay | Admitting: Adult Health

## 2022-07-24 DIAGNOSIS — Z1211 Encounter for screening for malignant neoplasm of colon: Secondary | ICD-10-CM

## 2022-07-24 NOTE — Telephone Encounter (Signed)
Pt's spouse called to say she has 2 missed calls, but they did not leave a message. There was no note in Epic showing who called her. CMA was unaware of calls. Spouse said never mind and if it was important, they'll call back.

## 2022-07-24 NOTE — Telephone Encounter (Signed)
This has been taking care. A new referral has been sent.

## 2022-07-25 NOTE — Telephone Encounter (Signed)
Referral for GI order placed!

## 2022-08-21 ENCOUNTER — Ambulatory Visit (INDEPENDENT_AMBULATORY_CARE_PROVIDER_SITE_OTHER): Payer: Medicare Other | Admitting: Internal Medicine

## 2022-08-21 ENCOUNTER — Encounter: Payer: Self-pay | Admitting: Internal Medicine

## 2022-08-21 VITALS — BP 150/60 | HR 104 | Ht 71.0 in | Wt 176.8 lb

## 2022-08-21 DIAGNOSIS — J449 Chronic obstructive pulmonary disease, unspecified: Secondary | ICD-10-CM

## 2022-08-21 DIAGNOSIS — J9611 Chronic respiratory failure with hypoxia: Secondary | ICD-10-CM

## 2022-08-21 MED ORDER — BREZTRI AEROSPHERE 160-9-4.8 MCG/ACT IN AERO: 2.0000 | INHALATION_SPRAY | Freq: Two times a day (BID) | RESPIRATORY_TRACT | 0 refills | Status: DC

## 2022-08-21 NOTE — Progress Notes (Signed)
Chase Lloyd    [id number]    18-Aug-1956  Primary Care Physician:Nafziger, Tommi Rumps, NP Date of Appointment: 08/21/2022 Established Patient Visit  Chief complaint:   Chief Complaint  Patient presents with   Follow-up    SOB     HPI: Chase Lloyd is a 66 y.o. man with COPD on home oxygen and AVN of the left hip.    Interval Updates: Here for one year follow up for COPD.  Hasn't seen his doctors most of this year.  He is taking only his inhalers and his omeprazole.  Wearing 2L on POC and wife concerned this is not enough. Feels he has gone down hill significantly over the past year. Barely leaves the house. Short of breath with rest and minimal exertion. Had tried to do pulmonary rehab before but it was on hold for a while due to covid.  He ambulates with a high walker due to pain but thinks he can do a stationary bike  I have reviewed the patient's family social and past medical history and updated as appropriate.   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  Used to live in [place] 70+ pack year smoking history, quit 2019  Past Medical History:  Diagnosis Date   Back pain    COPD (chronic obstructive pulmonary disease) (HCC)    DDD (degenerative disc disease), lumbar    Depression    Emphysema of lung ([place])    Empyema ([place])    2016  (was in [place])   GERD (gastroesophageal reflux disease)    Hearing loss of right ear    started 2001.     microphone in right ear ,  and hearing aid in left   Hodgkin's disease ([place]) 1999   Hypotension    Meniere's disease    Pneumonia    Tinnitus     Past Surgical History:  Procedure Laterality Date   BACK SURGERY     CERVICAL FUSION  2016   ELBOW SURGERY     EYE SURGERY     IR THORACENTESIS ASP PLEURAL SPACE W/IMG GUIDE  10/08/2018   LAMINECTOMY  1999   lower back  surgery     RIGHT/LEFT HEART CATH AND CORONARY ANGIOGRAPHY N/A 04/29/2019   Procedure: RIGHT/LEFT HEART CATH AND CORONARY ANGIOGRAPHY;  Surgeon: Troy Sine, MD;  Location: [facility];  Service: Cardiovascular;  Laterality: N/A;   TONSILLECTOMY      Family History  Problem Relation Age of Onset   Hypertension Father    Diabetes Father    Testicular cancer Brother    Melanoma Brother     Social History   Occupational History   Occupation: Retired  Tobacco Use   Smoking status: Former    Packs/day: 1.50    Years: 47.00    Total pack years: 70.50    Types: Cigarettes    Quit date: 06/13/2018    Years since quitting: 4.1   Smokeless tobacco: Never  Vaping Use   Vaping Use: Never used  Substance and Sexual Activity   Alcohol use: Yes    Comment: 24 cans in a week   Drug use: No   Sexual activity: Not on file   Physical Exam: Blood pressure (!) 150/60, pulse (!) 104, height '5\' 11"'$  (1.803 m), weight 176  lb 12.8 oz (80.2 kg), SpO2 96 %.  Gen:      No acute distress, chronically ill appearing Lungs:   diminished, short of breath with conversation CV:         tachycardic, regular Neuro: normal speech, no focal facial asymmetry   Data Reviewed: Imaging: I have personally reviewed the CT angio performed September 2020 which demonstrates severe centrilobular upper lobe predominant emphysema, no focal pneumonia, no PE. PFTs:      Latest Ref Rng & Units 10/22/2018    1:59 PM 07/30/2017   11:53 AM  PFT Results  FVC-Pre L 2.42  2.45   FVC-Predicted Pre % 50  50   FVC-Post L 2.87  2.90   FVC-Predicted Post % 59  59   Pre FEV1/FVC % % 46  46   Post FEV1/FCV % % 44  48   FEV1-Pre L 1.10  1.13   FEV1-Predicted Pre % 30  31   FEV1-Post L 1.26  1.39   DLCO uncorrected ml/min/mmHg 12.79  13.19   DLCO UNC% % 37  39   DLCO corrected ml/min/mmHg 14.51  14.16   DLCO COR %Predicted % 43  42   DLVA Predicted % 67  59   TLC L 5.86    TLC % Predicted % 81    RV %  Predicted % 129     I have personally reviewed the patient's PFTs and they are notable for very severe emphysema, with an FEV1 of 30% of predicted.  These results are consistent when trended over 2018 in 2019.  Labs:  Lab Results  Component Value Date   WBC 8.3 11/22/2021   HGB 13.5 11/22/2021   HCT 40.1 11/22/2021   MCV 92.4 11/22/2021   PLT 166.0 11/22/2021   Lab Results  Component Value Date   NA 137 11/22/2021   K 3.6 11/22/2021   CL 96 11/22/2021   CO2 36 (H) 11/22/2021   A1AT within normal range.  Elevated CO2 and ABG with hypercapnia  Immunization status: Immunization History  Administered Date(s) Administered   Fluad Quad(high Dose 65+) 09/02/2019   Influenza,inj,Quad PF,6+ Mos 12/27/2015, 10/11/2018   Pneumococcal Conjugate-13 12/26/2015   Td 01/01/2014   Zoster, Live 08/04/2015     Assessment:  Very Severe Emphysema FEV1 30% of predicted, progressing Chronic Respiratory Failure (with hypoxemia and hypercapnia) on 2LNC Avascular necrosis, left hip Hodgkin's disease status post mantle field radiation, now in remission  Plan/Recommendations: Continue Breztri 2 puffs twice a day. Continue albuterol as needed.  Will refer to pulmonary rehab for gold stage 4 COPD.  BODE score is 8 which gives him estimated 18% 4 year survival. I shared this news with him and his wife today. We talked extensively about COPD disease management and progression.  We did an ambulatory desaturation study today which shows he is ok on [facility] POC.   I spent 35 minutes in the care of this patient today including pre-charting, chart review, review of results, face-to-face care, coordination of care and communication with consultants etc.).  Return to Care: Return in about 4 months (around 12/22/2022).   Lenice Llamas, MD [facility] [place]

## 2022-08-21 NOTE — Patient Instructions (Addendum)
Please schedule follow up scheduled with myself in 4 months.  If my schedule is not open yet, we will contact you with a reminder closer to that time. Please call [phone] if you haven't heard from [name] a month before.   Follow up with pulmonary rehab - they will call you to schedule.  Keep on 2L oxygen.  Continue breztri 2 puffs twice a day.  Come see me sooner if needed.

## 2022-08-25 ENCOUNTER — Other Ambulatory Visit: Payer: Self-pay | Admitting: Internal Medicine

## 2022-08-25 DIAGNOSIS — J439 Emphysema, unspecified: Secondary | ICD-10-CM

## 2022-08-28 ENCOUNTER — Ambulatory Visit (INDEPENDENT_AMBULATORY_CARE_PROVIDER_SITE_OTHER): Payer: Medicare Other | Admitting: Internal Medicine

## 2022-08-28 DIAGNOSIS — J4489 Other specified chronic obstructive pulmonary disease: Secondary | ICD-10-CM

## 2022-08-28 DIAGNOSIS — J439 Emphysema, unspecified: Secondary | ICD-10-CM | POA: Diagnosis not present

## 2022-08-28 LAB — PULMONARY FUNCTION TEST
DL/VA % pred: 41 %
DL/VA: 1.68 ml/min/mmHg/L
DLCO cor % pred: 25 %
DLCO cor: 6.91 ml/min/mmHg
DLCO unc % pred: 25 %
DLCO unc: 6.91 ml/min/mmHg
FEF 25-75 Post: 0.44 L/sec
FEF 25-75 Pre: 0.34 L/sec
FEF2575-%Change-Post: 31 %
FEF2575-%Pred-Post: 16 %
FEF2575-%Pred-Pre: 12 %
FEV1-%Change-Post: 18 %
FEV1-%Pred-Post: 27 %
FEV1-%Pred-Pre: 22 %
FEV1-Post: 0.94 L
FEV1-Pre: 0.79 L
FEV1FVC-%Change-Post: 7 %
FEV1FVC-%Pred-Pre: 56 %
FEV6-%Change-Post: 9 %
FEV6-%Pred-Post: 45 %
FEV6-%Pred-Pre: 41 %
FEV6-Post: 2.01 L
FEV6-Pre: 1.83 L
FEV6FVC-%Change-Post: 0 %
FEV6FVC-%Pred-Post: 100 %
FEV6FVC-%Pred-Pre: 101 %
FVC-%Change-Post: 10 %
FVC-%Pred-Post: 45 %
FVC-%Pred-Pre: 40 %
FVC-Post: 2.11 L
FVC-Pre: 1.9 L
Post FEV1/FVC ratio: 45 %
Post FEV6/FVC ratio: 96 %
Pre FEV1/FVC ratio: 41 %
Pre FEV6/FVC Ratio: 96 %
RV % pred: 90 %
RV: 2.23 L
TLC % pred: 64 %
TLC: 4.65 L

## 2022-08-28 NOTE — Patient Instructions (Signed)
Full PFT performed today. °

## 2022-08-28 NOTE — Progress Notes (Signed)
Full PFT performed today. °

## 2022-09-05 ENCOUNTER — Encounter: Payer: Self-pay | Admitting: Internal Medicine

## 2022-09-05 ENCOUNTER — Other Ambulatory Visit (HOSPITAL_COMMUNITY): Payer: Self-pay

## 2022-09-05 ENCOUNTER — Other Ambulatory Visit: Payer: Self-pay

## 2022-09-05 ENCOUNTER — Ambulatory Visit (INDEPENDENT_AMBULATORY_CARE_PROVIDER_SITE_OTHER): Payer: Medicare Other | Admitting: Internal Medicine

## 2022-09-05 VITALS — BP 146/68 | HR 94 | Ht 71.0 in | Wt 180.2 lb

## 2022-09-05 DIAGNOSIS — R059 Cough, unspecified: Secondary | ICD-10-CM

## 2022-09-05 DIAGNOSIS — R1310 Dysphagia, unspecified: Secondary | ICD-10-CM

## 2022-09-05 DIAGNOSIS — Z1211 Encounter for screening for malignant neoplasm of colon: Secondary | ICD-10-CM

## 2022-09-05 DIAGNOSIS — R197 Diarrhea, unspecified: Secondary | ICD-10-CM | POA: Diagnosis not present

## 2022-09-05 MED ORDER — NA SULFATE-K SULFATE-MG SULF 17.5-3.13-1.6 GM/177ML PO SOLN: 1.0000 | ORAL | 0 refills | Status: DC

## 2022-09-05 NOTE — Patient Instructions (Addendum)
_______________________________________________________  If you are age 68 or older, your body mass index should be between 23-30. Your Body mass index is 25.14 kg/m. If this is out of the aforementioned range listed, please consider follow up with your Primary Care Provider.  If you are age 52 or younger, your body mass index should be between 19-25. Your Body mass index is 25.14 kg/m. If this is out of the aformentioned range listed, please consider follow up with your Primary Care Provider.   ________________________________________________________  The [facility] providers would like to encourage you to use [facility] to communicate with providers for non-urgent requests or questions.  Due to [place] hold times on the telephone, sending your provider a message by [facility] may be a faster and more efficient way to get a response.  Please allow 48 business hours for a response.  Please remember that this is for non-urgent requests.  _______________________________________________________   Chase Lloyd have been scheduled for an endoscopy and colonoscopy. Please follow the written instructions given to you at your visit today. Please pick up your prep supplies at the pharmacy within the next 1-3 days. If you use inhalers (even only as needed), please bring them with you on the day of your procedure.  Due to recent changes in healthcare laws, you may see the results of your imaging and laboratory studies on MyChart before your provider has had a chance to review them.  We understand that in some cases there may be results that are confusing or concerning to you. Not all laboratory results come back in the same time frame and the provider may be waiting for multiple results in order to interpret others.  Please give [name] 48 hours in order for your provider to thoroughly review all the results before contacting the office for clarification of your results.    You have been scheduled for a modified barium swallow on  09/16/2022 at 1:00 pm. Please arrive 30 minutes prior to your test for registration. You will go to [facility] (1st Floor) for your appointment. Should you need to cancel or reschedule your appointment, please contact [phone] Chase Lloyd) or [phone] Chase Lloyd). _____________________________________________________________________ A Modified Barium Swallow Study, or MBS, is a special x-ray that is taken to check swallowing skills. It is carried out by a Stage manager and a Psychologist, clinical (SLP). During this test, yourmouth, throat, and esophagus, a muscular tube which connects your mouth to your stomach, is checked. The test will help you, your doctor, and the SLP plan what types of foods and liquids are easier for you to swallow. The SLP will also identify positions and ways to help you swallow more easily and safely. What will happen during an MBS? You will be taken to an x-ray room and seated comfortably. You will be asked to swallow small amounts of food and liquid mixed with barium. Barium is a liquid or paste that allows images of your mouth, throat and esophagus to be seen on x-ray. The x-ray captures moving images of the food you are swallowing as it travels from your mouth through your throat and into your esophagus. This test helps identify whether food or liquid is entering your lungs (aspiration). The test also shows which part of your mouth or throat lacks strength or coordination to move the food or liquid in the right direction. This test typically takes 30 minutes to 1 hour to complete. _______________________________________________________________________   Aspiration Precautions, Adult Have the person sit in an upright position when  they eat or drink. This can be done by: Having the person sit up in a chair. Positioning the person in bed so they are upright. This can be done if sitting in a chair is not possible. Remind the person to eat slowly and  chew well. Make sure the person is awake and alert while eating. Never put food or liquids in the mouth of a person who is not fully alert. Do not distract the person, especially if they have problems with thinking or memory (cognitive problems). Allow foods to cool. Hot foods may be harder to swallow. Provide small meals often rather than three large meals. This may help the person to not feel tired when eating. After the person is done eating: Check their mouth well for leftover food. Keep them sitting upright for 30-45 minutes. Do not serve food or drink within 2 hours of bedtime  Thank you for entrusting me with your care and choosing [facility].  Dr [name]

## 2022-09-05 NOTE — Progress Notes (Signed)
Chief Complaint: Colon cancer screening, dysphagia  HPI : 66 year old male with history of COPD on 2 L  oxygen, DDD, GERD, Hodgkin's disease, CAD presents with colon cancer screening and dysphagia.   He has had dysphagia for years, which has worsened over the last year. Food will get stuck in the throat. He will choke with every meal. Endorses dysphagia to solids, not to liquids. Denies odynophagia. Denies N&V. He takes omeprazole 40 mg QD. Denies ab pain or blood in the stools. On average he goes to the bathroom 3 BMs every morning. He typically has diarrhea. He has difficulty with fecal urgency. Endorses some perianal pain. His last colonoscopy was 15-20 years ago that was normal. Denies family history of GI cancers. Father had MAFLD cirrhosis.   Wt Readings from Last 3 Encounters:  09/05/22 180 lb 4 oz (81.8 kg)  08/21/22 176 lb 12.8 oz (80.2 kg)  11/22/21 180 lb 12.8 oz (82 kg)   Past Medical History:  Diagnosis Date   Arthritis    Back pain    COPD (chronic obstructive pulmonary disease) (HCC)    DDD (degenerative disc disease), lumbar    Depression    Emphysema of lung ([place])    Empyema ([place])    2016  (was in [place])   GERD (gastroesophageal reflux disease)    Hearing loss of right ear    started 2001.     microphone in right ear ,  and hearing aid in left   Hodgkin's disease ([place]) 1999   Hypotension    Meniere's disease    Pneumonia    Tinnitus      Past Surgical History:  Procedure Laterality Date   BACK SURGERY     CERVICAL FUSION  2016   ELBOW SURGERY     EYE SURGERY     IR THORACENTESIS ASP PLEURAL SPACE W/IMG GUIDE  10/08/2018   LAMINECTOMY  1999   lower back surgery     RIGHT/LEFT HEART CATH AND CORONARY ANGIOGRAPHY N/A 04/29/2019   Procedure: RIGHT/LEFT HEART CATH AND CORONARY ANGIOGRAPHY;  Surgeon: Troy Sine, MD;  Location: [facility];  Service: Cardiovascular;  Laterality: N/A;   TONSILLECTOMY     Family History  Problem Relation Age of  Onset   Hypertension Father    Diabetes Father    Testicular cancer Brother    Melanoma Brother    Social History   Tobacco Use   Smoking status: Former    Packs/day: 1.50    Years: 47.00    Total pack years: 70.50    Types: Cigarettes    Quit date: 06/13/2018    Years since quitting: 4.2   Smokeless tobacco: Never  Vaping Use   Vaping Use: Never used  Substance Use Topics   Alcohol use: Yes    Comment: 24 cans in a week   Drug use: No   Current Outpatient Medications  Medication Sig Dispense Refill   Budeson-Glycopyrrol-Formoterol (BREZTRI AEROSPHERE) 160-9-4.8 MCG/ACT AERO Inhale 2 puffs into the lungs in the morning and at bedtime. 2 each 0   ibuprofen (ADVIL) 800 MG tablet Take 1 tablet (800 mg total) by mouth every 8 (eight) hours as needed. 270 tablet 1   Na Sulfate-K Sulfate-Mg Sulf 17.5-3.13-1.6 GM/177ML SOLN Take 1 kit by mouth as directed. 354 mL 0   omeprazole (PRILOSEC) 40 MG capsule Take 1 capsule by mouth twice daily 180 capsule 0   OXYGEN 2LPM AS NEEDED PER PT  albuterol (PROVENTIL) (2.5 MG/3ML) 0.083% nebulizer solution Take 3 mLs (2.5 mg total) by nebulization every 4 (four) hours as needed for wheezing or shortness of breath. 75 mL 12   albuterol (VENTOLIN HFA) 108 (90 Base) MCG/ACT inhaler Inhale 2 puffs into the lungs every 6 (six) hours as needed for wheezing or shortness of breath. (Patient not taking: Reported on 09/05/2022) 8 g 0   amoxicillin-clavulanate (AUGMENTIN) 875-125 MG tablet Take 1 tablet by mouth 2 (two) times daily. (Patient not taking: Reported on 08/21/2022) 20 tablet 0   aspirin EC 81 MG tablet Take 81 mg by mouth daily. (Patient not taking: Reported on 09/05/2022)     furosemide (LASIX) 20 MG tablet Take 1 tablet (20 mg total) by mouth daily. 30 tablet 11   isosorbide mononitrate (IMDUR) 30 MG 24 hr tablet Take 1 tablet (30 mg total) by mouth daily. (Patient not taking: Reported on 09/05/2022) 90 tablet 3   metoprolol succinate (TOPROL-XL)  25 MG 24 hr tablet Take 1 tablet (25 mg total) by mouth daily. (Patient not taking: Reported on 09/05/2022) 90 tablet 3   oxyCODONE-acetaminophen (PERCOCET/ROXICET) 5-325 MG tablet Take 1 tablet by mouth every 6 (six) hours as needed for severe pain. (Patient not taking: Reported on 09/05/2022) 15 tablet 0   predniSONE (DELTASONE) 20 MG tablet Take 2 tablets (40 mg total) by mouth daily. (Patient not taking: Reported on 08/21/2022) 10 tablet 0   rosuvastatin (CRESTOR) 10 MG tablet Take 1 tablet (10 mg total) by mouth daily. 90 tablet 3   No current facility-administered medications for this visit.   No Known Allergies   Review of Systems: All systems reviewed and negative except where noted in HPI.   Physical Exam: BP (!) 146/68   Pulse 94   Ht _0  (1.803 m)   Wt 180 lb 4 oz (81.8 kg)   BMI 25.14 kg/m  Constitutional: Pleasant,well-developed, male in no acute distress. HEENT: Normocephalic and atraumatic. Conjunctivae are normal. No scleral icterus. On 2L [place] oxygen Cardiovascular: Normal rate, regular rhythm.  Pulmonary/chest: Effort normal and breath sounds normal. No wheezing, rales or rhonchi. Abdominal: Soft, nondistended, nontender. Bowel sounds active throughout. There are no masses palpable. No hepatomegaly. Extremities: No edema Neurological: Alert and oriented to person place and time. Skin: Skin is warm and dry. No rashes noted. Psychiatric: Normal mood and affect. Behavior is normal.  Labs 11/2021: CBC nml. CMP nml.   CT A/P w/contrast 02/14/19: IMPRESSION: 1. No acute abnormality. 2. Slightly decreased small RIGHT pleural effusion and RIGHT basilar atelectasis. 3. Unchanged 1.5 cm splenic mass, statistically benign. 4.  Aortic Atherosclerosis (ICD10-I70.0).  ASSESSMENT AND PLAN: Colon cancer screening Dysphagia Diarrhea Patient presents with solid dysphagia for years that has worsened over the last year. From the patient's description of his symptoms, could be  due to oropharyngeal dysphagia for which I will refer him to speech therapy. However, would also want to rule out esophageal dysphagia (particularly malignancy) as a contributor. Will plan for EGD for further evaluation and possible dilation. In the meantime, will have the patient follow aspiration precautions to try to reduce his risk for aspiration. Patient is also due for colon cancer screening and has been experiencing some diarrhea so will get him scheduled for colonoscopy at the same as his EGD as well.  - Encourage him to chew foods carefully, fully swallow each bite before taking another bite, eat while sitting fully upright if possible, take your time with eating - Modified barium swallow with  speech therapy - EGD/colonoscopy [facility] since patient is on oxygen  Christia Reading, MD

## 2022-09-12 ENCOUNTER — Encounter (HOSPITAL_COMMUNITY): Payer: Self-pay

## 2022-09-12 ENCOUNTER — Encounter (HOSPITAL_COMMUNITY)
Admission: RE | Admit: 2022-09-12 | Discharge: 2022-09-12 | Disposition: A | Discharge status: Home / Self Care | Payer: Medicare Other | Source: Ambulatory Visit | Attending: Internal Medicine | Admitting: Internal Medicine

## 2022-09-12 VITALS — BP 180/60 | HR 95 | Ht 71.0 in | Wt 177.2 lb

## 2022-09-12 DIAGNOSIS — J449 Chronic obstructive pulmonary disease, unspecified: Secondary | ICD-10-CM | POA: Diagnosis not present

## 2022-09-12 NOTE — Progress Notes (Signed)
Pulmonary Individual Treatment Plan  Patient Details  Name: Chase Lloyd MRN: [id number] Date of Birth: 02-28-1956 Referring Provider:   Flowsheet Row PULMONARY REHAB COPD ORIENTATION from 09/12/2022 in [place]  Referring Provider Dr. Shearon Stalls       Initial Encounter Date:  Flowsheet Row PULMONARY REHAB COPD ORIENTATION from 09/12/2022 in [facility]  Date 09/12/22       Visit Diagnosis: Stage 4 very severe COPD by GOLD classification ([place])  Patient's Home Medications on Admission:   Current Outpatient Medications:    albuterol (PROVENTIL) (2.5 MG/3ML) 0.083% nebulizer solution, Take 3 mLs (2.5 mg total) by nebulization every 4 (four) hours as needed for wheezing or shortness of breath., Disp: 75 mL, Rfl: 12   albuterol (VENTOLIN HFA) 108 (90 Base) MCG/ACT inhaler, Inhale 2 puffs into the lungs every 6 (six) hours as needed for wheezing or shortness of breath., Disp: 8 g, Rfl: 0   Budeson-Glycopyrrol-Formoterol (BREZTRI AEROSPHERE) 160-9-4.8 MCG/ACT AERO, Inhale 2 puffs into the lungs in the morning and at bedtime., Disp: 2 each, Rfl: 0   Cholecalciferol (VITAMIN D-3 PO), Take 5,000 Units by mouth daily., Disp: , Rfl:    ibuprofen (ADVIL) 800 MG tablet, Take 1 tablet (800 mg total) by mouth every 8 (eight) hours as needed., Disp: 270 tablet, Rfl: 1   omeprazole (PRILOSEC) 40 MG capsule, Take 1 capsule by mouth twice daily (Patient taking differently: Take 40 mg by mouth daily before breakfast.), Disp: 180 capsule, Rfl: 0   OXYGEN, Inhale 2 L into the lungs continuous., Disp: , Rfl:    isosorbide mononitrate (IMDUR) 30 MG 24 hr tablet, Take 1 tablet (30 mg total) by mouth daily. (Patient not taking: Reported on 09/05/2022), Disp: 90 tablet, Rfl: 3   metoprolol succinate (TOPROL-XL) 25 MG 24 hr tablet, Take 1 tablet (25 mg total) by mouth daily. (Patient not taking: Reported on 09/05/2022), Disp: 90 tablet, Rfl: 3   Na Sulfate-K Sulfate-Mg  Sulf 17.5-3.13-1.6 GM/177ML SOLN, Take 1 kit by mouth as directed., Disp: 354 mL, Rfl: 0   rosuvastatin (CRESTOR) 10 MG tablet, Take 1 tablet (10 mg total) by mouth daily. (Patient not taking: Reported on 09/09/2022), Disp: 90 tablet, Rfl: 3  Past Medical History: Past Medical History:  Diagnosis Date   Arthritis    Back pain    COPD (chronic obstructive pulmonary disease) (HCC)    DDD (degenerative disc disease), lumbar    Depression    Emphysema of lung ([place])    Empyema ([place])    2016  (was in [place])   GERD (gastroesophageal reflux disease)    Hearing loss of right ear    started 2001.     microphone in right ear ,  and hearing aid in left   Hodgkin's disease ([place]) 1999   Hypotension    Meniere's disease    Pneumonia    Tinnitus     Tobacco Use: Social History   Tobacco Use  Smoking Status Former   Packs/day: 1.50   Years: 47.00   Total pack years: 70.50   Types: Cigarettes   Quit date: 06/13/2018   Years since quitting: 4.2  Smokeless Tobacco Never    Labs: Review Flowsheet       Latest Ref Rng & Units 05/05/2018 04/29/2019 07/01/2019 11/22/2021  Labs for ITP Cardiac and Pulmonary Rehab  Cholestrol 0 - 200 mg/dL 182  - 136  173   LDL (calc) 0 - 99 mg/dL 91  - 66  81   HDL-C >39.00 mg/dL 60.50  - 47  53.20   Trlycerides 0.0 - 149.0 mg/dL 153.0  - 116  196.0   PH, Arterial 7.350 - 7.450 - 7.338  - -  PCO2 arterial 32.0 - 48.0 mmHg - 54.3  - -  Bicarbonate 20.0 - 28.0 mmol/L - 29.2  30.0  31.0  - -  TCO2 22 - 32 mmol/L - 31  32  33  - -  O2 Saturation % - 100.0  73.0  72.0  - -    Capillary Blood Glucose: No results found for: "GLUCAP"   Pulmonary Assessment Scores:  Pulmonary Assessment Scores     Row Name 09/12/22 1422         ADL UCSD   ADL Phase Entry     SOB Score total 66     Rest 1     Walk 2     Stairs 4     Bath 3     Dress 2     Shop 1       CAT Score   CAT Score 30       mMRC Score   mMRC Score 3              UCSD: Self-administered rating of dyspnea associated with activities of daily living (ADLs) 6-point scale (0 = "not at all" to 5 = "maximal or unable to do because of breathlessness")  Scoring Scores range from 0 to 120.  Minimally important difference is 5 units  CAT: CAT can identify the health impairment of COPD patients and is better correlated with disease progression.  CAT has a scoring range of zero to 40. The CAT score is classified into four groups of low (less than 10), medium (10 - 20), high (21-30) and very high (31-40) based on the impact level of disease on health status. A CAT score over 10 suggests significant symptoms.  A worsening CAT score could be explained by an exacerbation, poor medication adherence, poor inhaler technique, or progression of COPD or comorbid conditions.  CAT MCID is 2 points  mMRC: mMRC (Modified Medical Research Council) Dyspnea Scale is used to assess the degree of baseline functional disability in patients of respiratory disease due to dyspnea. No minimal important difference is established. A decrease in score of 1 point or greater is considered a positive change.   Pulmonary Function Assessment:  Pulmonary Function Assessment - 09/12/22 1422       Post Bronchodilator Spirometry Results   FVC% 45 %    FEV1% 22 %    FEV1/FVC Ratio 45      Breath   Shortness of Breath Panic with Shortness of Breath;Yes;Fear of Shortness of Breath;Limiting activity             Exercise Target Goals: Exercise Program Goal: Individual exercise prescription set using results from initial 6 min walk test and THRR while considering  patient's activity barriers and safety.   Exercise Prescription Goal: Initial exercise prescription builds to 30-45 minutes a day of aerobic activity, 2-3 days per week.  Home exercise guidelines will be given to patient during program as part of exercise prescription that the participant will acknowledge.  Activity Barriers &  Risk Stratification:  Activity Barriers & Cardiac Risk Stratification - 09/12/22 1303       Activity Barriers & Cardiac Risk Stratification   Activity Barriers Left Hip Replacement;Back Problems;Deconditioning;Shortness of Breath;Arthritis;Assistive Device    Cardiac Risk Stratification Moderate  6 Minute Walk:  6 Minute Walk     Row Name 09/12/22 1413         6 Minute Walk   Phase Initial     Distance 850 feet     Walk Time 6 minutes     # of Rest Breaks 0     MPH 1.61     METS 3.07     RPE 12     Perceived Dyspnea  13     VO2 Peak 10.76     Symptoms No     Resting HR 95 bpm     Resting BP 180/60     Resting Oxygen Saturation  97 %     Exercise Oxygen Saturation  during 6 min walk 90 %     Max Ex. HR 114 bpm     Max Ex. BP 186/54     2 Minute Post BP 168/50       Interval HR   1 Minute HR 102     2 Minute HR 107     3 Minute HR 110     4 Minute HR 112     5 Minute HR 114     6 Minute HR 114     2 Minute Post HR 107     Interval Heart Rate? Yes       Interval Oxygen   Interval Oxygen? Yes     Baseline Oxygen Saturation % 97 %     1 Minute Oxygen Saturation % 92 %     1 Minute Liters of Oxygen 2 L     2 Minute Oxygen Saturation % 92 %     2 Minute Liters of Oxygen 2 L     3 Minute Oxygen Saturation % 90 %     3 Minute Liters of Oxygen 2 L     4 Minute Oxygen Saturation % 91 %     4 Minute Liters of Oxygen 2 L     5 Minute Oxygen Saturation % 91 %     5 Minute Liters of Oxygen 2 L     6 Minute Oxygen Saturation % 91 %     6 Minute Liters of Oxygen 2 L     2 Minute Post Oxygen Saturation % 95 %     2 Minute Post Liters of Oxygen 2 L              Oxygen Initial Assessment:  Oxygen Initial Assessment - 09/12/22 1421       Home Oxygen   Home Oxygen Device Portable Concentrator;Home Concentrator    Sleep Oxygen Prescription Continuous    Liters per minute 2    Home Exercise Oxygen Prescription Continuous    Liters per minute 2     Home Resting Oxygen Prescription Continuous    Liters per minute 2    Compliance with Home Oxygen Use Yes             Oxygen Re-Evaluation:   Oxygen Discharge (Final Oxygen Re-Evaluation):   Initial Exercise Prescription:  Initial Exercise Prescription - 09/12/22 1400       Date of Initial Exercise RX and Referring Provider   Date 09/12/22    Referring Provider Dr. Shearon Stalls    Expected Discharge Date 01/15/23      Oxygen   Oxygen Continuous    Liters 2    Maintain Oxygen Saturation 88% or higher      NuStep   Level 1  SPM 60    Minutes 17      Arm Ergometer   Level 1    RPM 50    Minutes 22      Prescription Details   Frequency (times per week) 2    Duration Progress to 30 minutes of continuous aerobic without signs/symptoms of physical distress      Intensity   THRR 40-80% of Max Heartrate 62-123    Ratings of Perceived Exertion 11-13    Perceived Dyspnea 0-4      Resistance Training   Training Prescription Yes    Weight 3    Reps 10-15             Perform Capillary Blood Glucose checks as needed.  Exercise Prescription Changes:   Exercise Comments:   Exercise Goals and Review:   Exercise Goals     Row Name 09/12/22 1417             Exercise Goals   Increase Physical Activity Yes       Intervention Provide advice, education, support and counseling about physical activity/exercise needs.;Develop an individualized exercise prescription for aerobic and resistive training based on initial evaluation findings, risk stratification, comorbidities and participant's personal goals.       Expected Outcomes Short Term: Attend rehab on a regular basis to increase amount of physical activity.;Cronce Term: Add in home exercise to make exercise part of routine and to increase amount of physical activity.;Levitt Term: Exercising regularly at least 3-5 days a week.       Increase Strength and Stamina Yes       Intervention Provide advice, education,  support and counseling about physical activity/exercise needs.;Develop an individualized exercise prescription for aerobic and resistive training based on initial evaluation findings, risk stratification, comorbidities and participant's personal goals.       Expected Outcomes Short Term: Increase workloads from initial exercise prescription for resistance, speed, and METs.;Short Term: Perform resistance training exercises routinely during rehab and add in resistance training at home;Ney Term: Improve cardiorespiratory fitness, muscular endurance and strength as measured by increased METs and functional capacity (6MWT)       Able to understand and use rate of perceived exertion (RPE) scale Yes       Intervention Provide education and explanation on how to use RPE scale       Expected Outcomes Short Term: Able to use RPE daily in rehab to express subjective intensity level;Conchas Term:  Able to use RPE to guide intensity level when exercising independently       Able to understand and use Dyspnea scale Yes       Intervention Provide education and explanation on how to use Dyspnea scale       Expected Outcomes Wyrick Term: Able to use Dyspnea scale to guide intensity level when exercising independently;Short Term: Able to use Dyspnea scale daily in rehab to express subjective sense of shortness of breath during exertion       Knowledge and understanding of Target Heart Rate Range (THRR) Yes       Intervention Provide education and explanation of THRR including how the numbers were predicted and where they are located for reference       Expected Outcomes Short Term: Able to state/look up THRR;Breeding Term: Able to use THRR to govern intensity when exercising independently;Short Term: Able to use daily as guideline for intensity in rehab       Understanding of Exercise Prescription Yes  Intervention Provide education, explanation, and written materials on patient's individual exercise prescription        Expected Outcomes Short Term: Able to explain program exercise prescription;Eschmann Term: Able to explain home exercise prescription to exercise independently                Exercise Goals Re-Evaluation :   Discharge Exercise Prescription (Final Exercise Prescription Changes):   Nutrition:  Target Goals: Understanding of nutrition guidelines, daily intake of sodium <1574m, cholesterol <2035m calories 30% from fat and 7% or less from saturated fats, daily to have 5 or more servings of fruits and vegetables.  Biometrics:  Pre Biometrics - 09/12/22 1417       Pre Biometrics   Height _0  (1.803 m)    Weight 80.4 kg    Waist Circumference 39 inches    Hip Circumference 39 inches    Waist to Hip Ratio 1 %    BMI (Calculated) 24.73    Triceps Skinfold 9 mm    % Body Fat 23.7 %    Grip Strength 40.7 kg    Flexibility 0 in    Single Leg Stand 0 seconds              Nutrition Therapy Plan and Nutrition Goals:  Nutrition Therapy & Goals - 09/12/22 1401       Personal Nutrition Goals   Comments Patient scored 10 on his diet assessment. His wife cooks and says she trys to cook healthy meals. Handout provided and explained to he and his wife regarding healthier choices. We offer 2 educational sessions on heart healthy nutrition with handouts and assistance with RD referral if patient is interested.      Intervention Plan   Intervention Nutrition handout(s) given to patient.    Expected Outcomes Short Term Goal: Understand basic principles of dietary content, such as calories, fat, sodium, cholesterol and nutrients.             Nutrition Assessments:  Nutrition Assessments - 09/12/22 1401       MEDFICTS Scores   Pre Score 10            MEDIFICTS Score Key: ?70 Need to make dietary changes  40-70 Heart Healthy Diet ? 40 Therapeutic Level Cholesterol Diet   Picture Your Plate Scores: <4<12nhealthy dietary pattern with much room for improvement. 41-50  Dietary pattern unlikely to meet recommendations for good health and room for improvement. 51-60 More healthful dietary pattern, with some room for improvement.  >60 Healthy dietary pattern, although there may be some specific behaviors that could be improved.    Nutrition Goals Re-Evaluation:   Nutrition Goals Discharge (Final Nutrition Goals Re-Evaluation):   Psychosocial: Target Goals: Acknowledge presence or absence of significant depression and/or stress, maximize coping skills, provide positive support system. Participant is able to verbalize types and ability to use techniques and skills needed for reducing stress and depression.  Initial Review & Psychosocial Screening:  Initial Psych Review & Screening - 09/12/22 1426       Initial Review   Current issues with Current Anxiety/Panic      Family Dynamics   Good Support System? Yes      Barriers   Psychosocial barriers to participate in program Psychosocial barriers identified (see note)      Screening Interventions   Interventions Encouraged to exercise;Provide feedback about the scores to participant    Expected Outcomes Short Term goal: Identification and review with participant of any Quality of Life  or Depression concerns found by scoring the questionnaire.             Quality of Life Scores:  Quality of Life - 09/12/22 1413       Quality of Life   Select Quality of Life      Quality of Life Scores   Health/Function Pre 6.72 %    Socioeconomic Pre 20.2 %    Psych/Spiritual Pre 7.79 %    Family Pre 27.1 %    GLOBAL Pre 12.08 %            Scores of 19 and below usually indicate a poorer quality of life in these areas.  A difference of  2-3 points is a clinically meaningful difference.  A difference of 2-3 points in the total score of the Quality of Life Index has been associated with significant improvement in overall quality of life, self-image, physical symptoms, and general health in studies  assessing change in quality of life.   PHQ-9: Review Flowsheet  More data may exist      09/12/2022 11/12/2021 10/23/2020 12/14/2018 11/10/2018  Depression screen PHQ 2/9  Decreased Interest 0 0 0 3 3  Down, Depressed, Hopeless 0 0 0 3 3  PHQ - 2 Score 0 0 0 6 6  Altered sleeping 3 - - 3 3  Tired, decreased energy 3 - - 3 3  Change in appetite 3 - - 0 0  Feeling bad or failure about yourself  3 - - 2 2  Trouble concentrating 0 - - 0 0  Moving slowly or fidgety/restless 0 - - 0 0  Suicidal thoughts 0 - - 0 0  PHQ-9 Score 12 - - 14 14  Difficult doing work/chores Extremely dIfficult - - - -   Interpretation of Total Score  Total Score Depression Severity:  1-4 = Minimal depression, 5-9 = Mild depression, 10-14 = Moderate depression, 15-19 = Moderately severe depression, 20-27 = Severe depression   Psychosocial Evaluation and Intervention:  Psychosocial Evaluation - 09/12/22 1427       Psychosocial Evaluation & Interventions   Interventions Encouraged to exercise with the program and follow exercise prescription;Stress management education;Relaxation education    Comments Patient has no psychosocial barriers identified at his orientaiton visit. His wife of 54 years was with him and helped answer questions. He scored 13 on his PHQ-9 and low QOL socres mainly due to his chronic COPD that limits his ability to do activities. He says he used to be very active and loved to restore cars but now is not able to do hardly anything. He and his wife are from [place] and relocated 4 years ago to [place] to be closer to their daughter. They have 5 children, 2 are deceased and 2 are adopted. They have multiple grandchildren. 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 Patient has no diagnosis of anxiety but reports feeling very anxious and panicky when he gets SOB. His wife feels he needs to be treated for anxiety. I encouraged them to speak with their PCP about this and his wife said she would address  with Dr. Shearon Stalls. He also has trouble staying asleep but is not taking anything as a sleep aide. Patient is not sure he can exercise for 34 minutes but he wants to try. He is totally sedentary now and wants to participate in the program to gain some endurance and be able to do more activities with less SOB. He is ready to  start the program.    Expected Outcomes Patient will continue to have no psychosocial barriers identified.    Continue Psychosocial Services  No Follow up required             Psychosocial Re-Evaluation:   Psychosocial Discharge (Final Psychosocial Re-Evaluation):    Education: Education Goals: Education classes will be provided on a weekly basis, covering required topics. Participant will state understanding/return demonstration of topics presented.  Learning Barriers/Preferences:  Learning Barriers/Preferences - 09/12/22 1404       Learning Barriers/Preferences   Learning Barriers Hearing   Deaf in right ear.   Learning Preferences Written Material;Audio;Pictoral             Education Topics: How Lungs Work and Diseases: - Discuss the anatomy of the lungs and diseases that can affect the lungs, such as COPD.   Exercise: -Discuss the importance of exercise, FITT principles of exercise, normal and abnormal responses to exercise, and how to exercise safely.   Environmental Irritants: -Discuss types of environmental irritants and how to limit exposure to environmental irritants.   Meds/Inhalers and oxygen: - Discuss respiratory medications, definition of an inhaler and oxygen, and the proper way to use an inhaler and oxygen.   Energy Saving Techniques: - Discuss methods to conserve energy and decrease shortness of breath when performing activities of daily living.    Bronchial Hygiene / Breathing Techniques: - Discuss breathing mechanics, pursed-lip breathing technique,  proper posture, effective ways to clear airways, and other functional  breathing techniques   Cleaning Equipment: - Provides group verbal and written instruction about the health risks of elevated stress, cause of high stress, and healthy ways to reduce stress.   Nutrition I: Fats: - Discuss the types of cholesterol, what cholesterol does to the body, and how cholesterol levels can be controlled.   Nutrition II: Labels: -Discuss the different components of food labels and how to read food labels.   Respiratory Infections: - Discuss the signs and symptoms of respiratory infections, ways to prevent respiratory infections, and the importance of seeking medical treatment when having a respiratory infection.   Stress I: Signs and Symptoms: - Discuss the causes of stress, how stress may lead to anxiety and depression, and ways to limit stress.   Stress II: Relaxation: -Discuss relaxation techniques to limit stress.   Oxygen for Home/Travel: - Discuss how to prepare for travel when on oxygen and proper ways to transport and store oxygen to ensure safety.   Knowledge Questionnaire Score:  Knowledge Questionnaire Score - 09/12/22 1405       Knowledge Questionnaire Score   Pre Score 4/18             Core Components/Risk Factors/Patient Goals at Admission:  Personal Goals and Risk Factors at Admission - 09/12/22 1424       Core Components/Risk Factors/Patient Goals on Admission    Weight Management Weight Maintenance    Improve shortness of breath with ADL's Yes    Intervention Provide education, individualized exercise plan and daily activity instruction to help decrease symptoms of SOB with activities of daily living.    Expected Outcomes Short Term: Improve cardiorespiratory fitness to achieve a reduction of symptoms when performing ADLs;Cancio Term: Be able to perform more ADLs without symptoms or delay the onset of symptoms    Hypertension Yes    Intervention Provide education on lifestyle modifcations including regular physical  activity/exercise, weight management, moderate sodium restriction and increased consumption of fresh fruit, vegetables, and low fat  dairy, alcohol moderation, and smoking cessation.;Monitor prescription use compliance.    Expected Outcomes Short Term: Continued assessment and intervention until BP is < 140/82m HG in hypertensive participants. < 130/826mHG in hypertensive participants with diabetes, heart failure or chronic kidney disease.;Faniel Term: Maintenance of blood pressure at goal levels.    Personal Goal Other Yes    Personal Goal Patient wants to be able to walk around more with less SOB and improve his endurance.    Intervention Patient will attend PR 2 days/week with exercise and education.    Expected Outcomes Patient will complete the program meeting both personal and program goals.             Core Components/Risk Factors/Patient Goals Review:    Core Components/Risk Factors/Patient Goals at Discharge (Final Review):    ITP Comments:   Comments: Patient arrived for 1st visit/orientation/education at 1230. Patient was referred to PR by Dr. NiLenice Llamasue to COPD(J44.9. During orientation advised patient on arrival and appointment times what to wear, what to do before, during and after exercise. Reviewed attendance and class policy.  Pt is scheduled to return Pulmonary Rehab on 09/18/22 at 1330. Pt was advised to come to class 15 minutes before class starts.  Discussed RPE/Dpysnea scales. Patient participated in warm up stretches. Patient was able to complete 6 minute walk test.  Patient was measured for the equipment. Discussed equipment safety with patient. Took patient pre-anthropometric measurements. Patient finished visit at 1415.

## 2022-09-15 ENCOUNTER — Other Ambulatory Visit: Payer: Self-pay | Admitting: Adult Health

## 2022-09-16 ENCOUNTER — Ambulatory Visit (HOSPITAL_COMMUNITY)
Admission: RE | Admit: 2022-09-16 | Discharge: 2022-09-16 | Disposition: A | Discharge status: Home / Self Care | Payer: Medicare Other | Source: Ambulatory Visit | Attending: Adult Health | Admitting: Adult Health

## 2022-09-16 DIAGNOSIS — R059 Cough, unspecified: Secondary | ICD-10-CM

## 2022-09-16 DIAGNOSIS — R1310 Dysphagia, unspecified: Secondary | ICD-10-CM

## 2022-09-16 DIAGNOSIS — R1313 Dysphagia, pharyngeal phase: Secondary | ICD-10-CM | POA: Insufficient documentation

## 2022-09-17 NOTE — Telephone Encounter (Addendum)
Okay for refill?  

## 2022-09-18 ENCOUNTER — Encounter (HOSPITAL_COMMUNITY)
Admission: RE | Admit: 2022-09-18 | Discharge: 2022-09-18 | Disposition: A | Discharge status: Home / Self Care | Payer: Medicare Other | Source: Ambulatory Visit | Attending: Internal Medicine | Admitting: Internal Medicine

## 2022-09-18 DIAGNOSIS — J449 Chronic obstructive pulmonary disease, unspecified: Secondary | ICD-10-CM

## 2022-09-18 NOTE — Progress Notes (Signed)
Daily Session Note  Patient Details  Name: Chase Lloyd MRN: [id number] Date of Birth: 09-07-1956 Referring Provider:   Flowsheet Row PULMONARY REHAB COPD ORIENTATION from 09/12/2022 in [place]  Referring Provider Dr. Shearon Stalls       Encounter Date: 09/18/2022  Check In:  Session Check In - 09/18/22 1327       Check-In   Supervising physician immediately available to respond to emergencies [facility] MD immediately available    Physician(s) Dr. Londell Moh    Location [facility]    Staff Present Leana Roe, BS, Exercise Physiologist;Hillary Troutman BSN, RN;Ragena Fiola Hassell Done, RN, BSN    Virtual Visit No    Medication changes reported     No    Fall or balance concerns reported    Yes    Comments Patient uses a standing walker for ambulation.    Tobacco Cessation No Change    Warm-up and Cool-down Performed as group-led instruction    Resistance Training Performed Yes    VAD Patient? No    PAD/SET Patient? No      Pain Assessment   Currently in Pain? No/denies    Pain Score 0-No pain    Multiple Pain Sites No             Capillary Blood Glucose: No results found for this or any previous visit (from the past 24 hour(s)).    Social History   Tobacco Use  Smoking Status Former   Packs/day: 1.50   Years: 47.00   Total pack years: 70.50   Types: Cigarettes   Quit date: 06/13/2018   Years since quitting: 4.2  Smokeless Tobacco Never    Goals Met:  Proper associated with RPD/PD & O2 Sat Independence with exercise equipment Using PLB without cueing & demonstrates good technique Exercise tolerated well Queuing for purse lip breathing No report of concerns or symptoms today Strength training completed today  Goals Unmet:  Not Applicable  Comments: Checkout at 1430.   Dr. Kathie Dike is Medical Director for [facility] Pulmonary Re[facility].

## 2022-09-23 ENCOUNTER — Encounter (HOSPITAL_COMMUNITY)
Admission: RE | Admit: 2022-09-23 | Discharge: 2022-09-23 | Disposition: A | Discharge status: Home / Self Care | Payer: Medicare Other | Source: Ambulatory Visit | Attending: Internal Medicine | Admitting: Internal Medicine

## 2022-09-23 VITALS — Wt 178.6 lb

## 2022-09-23 DIAGNOSIS — J449 Chronic obstructive pulmonary disease, unspecified: Secondary | ICD-10-CM | POA: Diagnosis not present

## 2022-09-23 NOTE — Progress Notes (Signed)
Daily Session Note  Patient Details  Name: Chase Lloyd MRN: [id number] Date of Birth: January 12, 1956 Referring Provider:   Flowsheet Row PULMONARY REHAB COPD ORIENTATION from 09/12/2022 in [place]  Referring Provider Dr. Shearon Stalls       Encounter Date: 09/23/2022  Check In:  Session Check In - 09/23/22 1325       Check-In   Supervising physician immediately available to respond to emergencies [facility] immediately available    Physician(s) Dr Domenic Polite    Location [facility]    Staff Present Leana Roe, BS, Exercise Physiologist;Skilar Marcou Hassell Done, RN, BSN    Virtual Visit No    Medication changes reported     No    Fall or balance concerns reported    Yes    Comments Patient uses a standing walker for ambulation.    Warm-up and Cool-down Performed as group-led Higher education careers adviser Performed Yes    VAD Patient? No    PAD/SET Patient? No      Pain Assessment   Currently in Pain? No/denies    Pain Score 0-No pain    Multiple Pain Sites No             Capillary Blood Glucose: No results found for this or any previous visit (from the past 24 hour(s)).    Social History   Tobacco Use  Smoking Status Former   Packs/day: 1.50   Years: 47.00   Total pack years: 70.50   Types: Cigarettes   Quit date: 06/13/2018   Years since quitting: 4.2  Smokeless Tobacco Never    Goals Met:  Proper associated with RPD/PD & O2 Sat Independence with exercise equipment Using PLB without cueing & demonstrates good technique Exercise tolerated well Queuing for purse lip breathing No report of concerns or symptoms today Strength training completed today  Goals Unmet:  Not Applicable  Comments: Checkout at 1430.   Dr. Kathie Dike is Medical Director for [facility].

## 2022-09-25 ENCOUNTER — Encounter (HOSPITAL_COMMUNITY): Payer: Medicare Other

## 2022-09-30 ENCOUNTER — Encounter (HOSPITAL_COMMUNITY)
Admission: RE | Admit: 2022-09-30 | Discharge: 2022-09-30 | Disposition: A | Discharge status: Home / Self Care | Payer: Medicare Other | Source: Ambulatory Visit | Attending: Internal Medicine | Admitting: Internal Medicine

## 2022-09-30 DIAGNOSIS — J449 Chronic obstructive pulmonary disease, unspecified: Secondary | ICD-10-CM | POA: Diagnosis not present

## 2022-09-30 NOTE — Progress Notes (Signed)
Daily Session Note  Patient Details  Name: Chase Lloyd MRN: [id number] Date of Birth: 12/30/1955 Referring Provider:   Flowsheet Row PULMONARY REHAB COPD ORIENTATION from 09/12/2022 in [place]  Referring Provider Dr. Shearon Stalls       Encounter Date: 09/30/2022  Check In:  Session Check In - 09/30/22 1328       Check-In   Supervising physician immediately available to respond to emergencies [facility] MD immediately available    Physician(s) Dr Dellia Cloud    Location [facility]    Staff Present Leana Roe, BS, Exercise Physiologist;Keirah Konitzer Hassell Done, RN, BSN    Virtual Visit No    Medication changes reported     No    Fall or balance concerns reported    Yes    Comments Patient uses a standing walker for ambulation.    Tobacco Cessation No Change    Warm-up and Cool-down Performed as group-led instruction    Resistance Training Performed Yes    VAD Patient? No    PAD/SET Patient? No      Pain Assessment   Currently in Pain? No/denies    Pain Score 0-No pain    Multiple Pain Sites No             Capillary Blood Glucose: No results found for this or any previous visit (from the past 24 hour(s)).    Social History   Tobacco Use  Smoking Status Former   Packs/day: 1.50   Years: 47.00   Total pack years: 70.50   Types: Cigarettes   Quit date: 06/13/2018   Years since quitting: 4.3  Smokeless Tobacco Never    Goals Met:  Proper associated with RPD/PD & O2 Sat Independence with exercise equipment Using PLB without cueing & demonstrates good technique Exercise tolerated well Queuing for purse lip breathing No report of concerns or symptoms today Strength training completed today  Goals Unmet:  Not Applicable  Comments: Checkout at 1430.   Dr. Kathie Dike is Medical Director for [facility] Pul[facility] Re[facility].

## 2022-10-02 ENCOUNTER — Encounter (HOSPITAL_COMMUNITY)
Admission: RE | Admit: 2022-10-02 | Discharge: 2022-10-02 | Disposition: A | Discharge status: Home / Self Care | Payer: Medicare Other | Source: Ambulatory Visit | Attending: Internal Medicine | Admitting: Internal Medicine

## 2022-10-02 DIAGNOSIS — J449 Chronic obstructive pulmonary disease, unspecified: Secondary | ICD-10-CM | POA: Diagnosis not present

## 2022-10-02 NOTE — Progress Notes (Signed)
Daily Session Note  Patient Details  Name: Chase Lloyd MRN: [id number] Date of Birth: 1956-03-14 Referring Provider:   Flowsheet Row PULMONARY REHAB COPD ORIENTATION from 09/12/2022 in [place]  Referring Provider Dr. Shearon Stalls       Encounter Date: 10/02/2022  Check In:  Session Check In - 10/02/22 1329       Check-In   Supervising physician immediately available to respond to emergencies [facility] immediately available    Physician(s) Dr Dellia Cloud    Location [facility]    Staff Present Leana Roe, BS, Exercise Physiologist;Karter Haire BSN, RN;Daphyne Hassell Done, RN, BSN    Virtual Visit No    Medication changes reported     No    Fall or balance concerns reported    Yes    Comments Patient uses a standing walker for ambulation.    Tobacco Cessation No Change    Warm-up and Cool-down Performed as group-led instruction    Resistance Training Performed Yes    VAD Patient? No    PAD/SET Patient? No      Pain Assessment   Currently in Pain? Yes    Pain Score 4     Pain Location Back    Pain Orientation Lower    Pain Descriptors / Indicators Aching    Pain Type Chronic pain    Pain Frequency Constant    Pain Relieving Factors medication    Multiple Pain Sites No             Capillary Blood Glucose: No results found for this or any previous visit (from the past 24 hour(s)).    Social History   Tobacco Use  Smoking Status Former   Packs/day: 1.50   Years: 47.00   Total pack years: 70.50   Types: Cigarettes   Quit date: 06/13/2018   Years since quitting: 4.3  Smokeless Tobacco Never    Goals Met:  Proper associated with RPD/PD & O2 Sat Independence with exercise equipment Using PLB without cueing & demonstrates good technique Exercise tolerated well Queuing for purse lip breathing No report of concerns or symptoms today Strength training completed today  Goals Unmet:  Not Applicable  Comments: check  out at 2:30   Dr. Kathie Dike is Medical Director for [facility] Pulmonary Rehab.

## 2022-10-06 ENCOUNTER — Telehealth: Payer: Self-pay | Admitting: Internal Medicine

## 2022-10-06 NOTE — Telephone Encounter (Signed)
Patient's daughter Theadora Rama called to ask if it was at al possible that they change the procedure date for the day after because the patients care partner (wife) already has the day off. Requested a call back to discuss further.

## 2022-10-06 NOTE — Telephone Encounter (Signed)
Spoke with [place]. Explained that unfortunately the hospital procedures are limited to the dates given to [name].  She expresses understanding. She did say it will be difficult to stay through the entire procedure. I explained that it is possible that she may not have to be present during the procedure, but someone must be available when the patient is released. Theadora Rama thanks me for this information.

## 2022-10-07 ENCOUNTER — Encounter (HOSPITAL_COMMUNITY)
Admission: RE | Admit: 2022-10-07 | Discharge: 2022-10-07 | Disposition: A | Discharge status: Home / Self Care | Payer: Medicare Other | Source: Ambulatory Visit | Attending: Internal Medicine | Admitting: Internal Medicine

## 2022-10-07 VITALS — Wt 178.6 lb

## 2022-10-07 DIAGNOSIS — J449 Chronic obstructive pulmonary disease, unspecified: Secondary | ICD-10-CM | POA: Diagnosis not present

## 2022-10-07 NOTE — Progress Notes (Signed)
Daily Session Note  Patient Details  Name: Chase Lloyd MRN: [id number] Date of Birth: June 15, 1956 Referring Provider:   Flowsheet Row PULMONARY REHAB COPD ORIENTATION from 09/12/2022 in [place]  Referring Provider Dr. Shearon Stalls       Encounter Date: 10/07/2022  Check In:  Session Check In - 10/07/22 1325       Check-In   Supervising physician immediately available to respond to emergencies [facility] MD immediately available    Physician(s) Dr Harl Bowie    Location [facility]    Staff Present Leana Roe, BS, Exercise Physiologist;Kailani Brass Hassell Done, RN, BSN;Phyllis Billingsley, RN    Virtual Visit No    Medication changes reported     No    Fall or balance concerns reported    Yes    Comments Patient uses a standing walker for ambulation.    Tobacco Cessation No Change    Warm-up and Cool-down Performed as group-led instruction    Resistance Training Performed Yes      Pain Assessment   Currently in Pain? No/denies    Pain Score 0-No pain    Multiple Pain Sites No             Capillary Blood Glucose: No results found for this or any previous visit (from the past 24 hour(s)).    Social History   Tobacco Use  Smoking Status Former   Packs/day: 1.50   Years: 47.00   Total pack years: 70.50   Types: Cigarettes   Quit date: 06/13/2018   Years since quitting: 4.3  Smokeless Tobacco Never    Goals Met:  Proper associated with RPD/PD & O2 Sat Independence with exercise equipment Using PLB without cueing & demonstrates good technique Exercise tolerated well Queuing for purse lip breathing No report of concerns or symptoms today Strength training completed today  Goals Unmet:  Not Applicable  Comments: Checkout at 1430.   Dr. Kathie Dike is Medical Director for [facility] Pul[facility] Re[facility].

## 2022-10-08 NOTE — Progress Notes (Signed)
Pulmonary Individual Treatment Plan  Patient Details  Name: Chase Lloyd MRN: [id number] Date of Birth: 1956-01-01 Referring Provider:   Flowsheet Row PULMONARY REHAB COPD ORIENTATION from 09/12/2022 in [facility]  Referring Provider Dr. Shearon Stalls       Initial Encounter Date:  Flowsheet Row PULMONARY REHAB COPD ORIENTATION from 09/12/2022 in [facility]  Date 09/12/22       Visit Diagnosis: Stage 4 very severe COPD by GOLD classification ([place])  Patient's Home Medications on Admission:   Current Outpatient Medications:    albuterol (PROVENTIL) (2.5 MG/3ML) 0.083% nebulizer solution, Take 3 mLs (2.5 mg total) by nebulization every 4 (four) hours as needed for wheezing or shortness of breath., Disp: 75 mL, Rfl: 12   albuterol (VENTOLIN HFA) 108 (90 Base) MCG/ACT inhaler, Inhale 2 puffs into the lungs every 6 (six) hours as needed for wheezing or shortness of breath., Disp: 8 g, Rfl: 0   Budeson-Glycopyrrol-Formoterol (BREZTRI AEROSPHERE) 160-9-4.8 MCG/ACT AERO, Inhale 2 puffs into the lungs in the morning and at bedtime., Disp: 2 each, Rfl: 0   Cholecalciferol (VITAMIN D-3 PO), Take 5,000 Units by mouth daily., Disp: , Rfl:    ibuprofen (ADVIL) 800 MG tablet, TAKE 1 TABLET BY MOUTH EVERY 8 HOURS AS NEEDED, Disp: 270 tablet, Rfl: 0   isosorbide mononitrate (IMDUR) 30 MG 24 hr tablet, Take 1 tablet (30 mg total) by mouth daily. (Patient not taking: Reported on 09/05/2022), Disp: 90 tablet, Rfl: 3   metoprolol succinate (TOPROL-XL) 25 MG 24 hr tablet, Take 1 tablet (25 mg total) by mouth daily. (Patient not taking: Reported on 09/05/2022), Disp: 90 tablet, Rfl: 3   Na Sulfate-K Sulfate-Mg Sulf 17.5-3.13-1.6 GM/177ML SOLN, Take 1 kit by mouth as directed., Disp: 354 mL, Rfl: 0   omeprazole (PRILOSEC) 40 MG capsule, Take 1 capsule by mouth twice daily (Patient taking differently: Take 40 mg by mouth daily before breakfast.), Disp: 180 capsule, Rfl: 0    OXYGEN, Inhale 2 L into the lungs continuous., Disp: , Rfl:    rosuvastatin (CRESTOR) 10 MG tablet, Take 1 tablet (10 mg total) by mouth daily. (Patient not taking: Reported on 09/09/2022), Disp: 90 tablet, Rfl: 3  Past Medical History: Past Medical History:  Diagnosis Date   Arthritis    Back pain    COPD (chronic obstructive pulmonary disease) (HCC)    DDD (degenerative disc disease), lumbar    Depression    Emphysema of lung ([place])    Empyema ([place])    2016  (was in [place])   GERD (gastroesophageal reflux disease)    Hearing loss of right ear    started 2001.     microphone in right ear ,  and hearing aid in left   Hodgkin's disease ([place]) 1999   Hypotension    Meniere's disease    Pneumonia    Tinnitus     Tobacco Use: Social History   Tobacco Use  Smoking Status Former   Packs/day: 1.50   Years: 47.00   Total pack years: 70.50   Types: Cigarettes   Quit date: 06/13/2018   Years since quitting: 4.3  Smokeless Tobacco Never    Labs: Review Flowsheet       Latest Ref Rng & Units 05/05/2018 04/29/2019 07/01/2019 11/22/2021  Labs for ITP Cardiac and Pulmonary Rehab  Cholestrol 0 - 200 mg/dL 182  - 136  173   LDL (calc) 0 - 99 mg/dL 91  - 66  81  HDL-C >39.00 mg/dL 60.50  - 47  53.20   Trlycerides 0.0 - 149.0 mg/dL 153.0  - 116  196.0   PH, Arterial 7.350 - 7.450 - 7.338  - -  PCO2 arterial 32.0 - 48.0 mmHg - 54.3  - -  Bicarbonate 20.0 - 28.0 mmol/L - 29.2  30.0  31.0  - -  TCO2 22 - 32 mmol/L - 31  32  33  - -  O2 Saturation % - 100.0  73.0  72.0  - -    Capillary Blood Glucose: No results found for: "GLUCAP"   Pulmonary Assessment Scores:  Pulmonary Assessment Scores     Row Name 09/12/22 1422         ADL UCSD   ADL Phase Entry     SOB Score total 66     Rest 1     Walk 2     Stairs 4     Bath 3     Dress 2     Shop 1       CAT Score   CAT Score 30       mMRC Score   mMRC Score 3             UCSD: Self-administered rating of dyspnea  associated with activities of daily living (ADLs) 6-point scale (0 = "not at all" to 5 = "maximal or unable to do because of breathlessness")  Scoring Scores range from 0 to 120.  Minimally important difference is 5 units  CAT: CAT can identify the health impairment of COPD patients and is better correlated with disease progression.  CAT has a scoring range of zero to 40. The CAT score is classified into four groups of low (less than 10), medium (10 - 20), high (21-30) and very high (31-40) based on the impact level of disease on health status. A CAT score over 10 suggests significant symptoms.  A worsening CAT score could be explained by an exacerbation, poor medication adherence, poor inhaler technique, or progression of COPD or comorbid conditions.  CAT MCID is 2 points  mMRC: mMRC (Modified Medical Research Council) Dyspnea Scale is used to assess the degree of baseline functional disability in patients of respiratory disease due to dyspnea. No minimal important difference is established. A decrease in score of 1 point or greater is considered a positive change.   Pulmonary Function Assessment:  Pulmonary Function Assessment - 09/12/22 1422       Post Bronchodilator Spirometry Results   FVC% 45 %    FEV1% 22 %    FEV1/FVC Ratio 45      Breath   Shortness of Breath Panic with Shortness of Breath;Yes;Fear of Shortness of Breath;Limiting activity             Exercise Target Goals: Exercise Program Goal: Individual exercise prescription set using results from initial 6 min walk test and THRR while considering  patient's activity barriers and safety.   Exercise Prescription Goal: Initial exercise prescription builds to 30-45 minutes a day of aerobic activity, 2-3 days per week.  Home exercise guidelines will be given to patient during program as part of exercise prescription that the participant will acknowledge.  Activity Barriers & Risk Stratification:  Activity Barriers &  Cardiac Risk Stratification - 09/12/22 1303       Activity Barriers & Cardiac Risk Stratification   Activity Barriers Left Hip Replacement;Back Problems;Deconditioning;Shortness of Breath;Arthritis;Assistive Device    Cardiac Risk Stratification Moderate  6 Minute Walk:  6 Minute Walk     Row Name 09/12/22 1413         6 Minute Walk   Phase Initial     Distance 850 feet     Walk Time 6 minutes     # of Rest Breaks 0     MPH 1.61     METS 3.07     RPE 12     Perceived Dyspnea  13     VO2 Peak 10.76     Symptoms No     Resting HR 95 bpm     Resting BP 180/60     Resting Oxygen Saturation  97 %     Exercise Oxygen Saturation  during 6 min walk 90 %     Max Ex. HR 114 bpm     Max Ex. BP 186/54     2 Minute Post BP 168/50       Interval HR   1 Minute HR 102     2 Minute HR 107     3 Minute HR 110     4 Minute HR 112     5 Minute HR 114     6 Minute HR 114     2 Minute Post HR 107     Interval Heart Rate? Yes       Interval Oxygen   Interval Oxygen? Yes     Baseline Oxygen Saturation % 97 %     1 Minute Oxygen Saturation % 92 %     1 Minute Liters of Oxygen 2 L     2 Minute Oxygen Saturation % 92 %     2 Minute Liters of Oxygen 2 L     3 Minute Oxygen Saturation % 90 %     3 Minute Liters of Oxygen 2 L     4 Minute Oxygen Saturation % 91 %     4 Minute Liters of Oxygen 2 L     5 Minute Oxygen Saturation % 91 %     5 Minute Liters of Oxygen 2 L     6 Minute Oxygen Saturation % 91 %     6 Minute Liters of Oxygen 2 L     2 Minute Post Oxygen Saturation % 95 %     2 Minute Post Liters of Oxygen 2 L              Oxygen Initial Assessment:  Oxygen Initial Assessment - 09/12/22 1421       Home Oxygen   Home Oxygen Device Portable Concentrator;Home Concentrator    Sleep Oxygen Prescription Continuous    Liters per minute 2    Home Exercise Oxygen Prescription Continuous    Liters per minute 2    Home Resting Oxygen Prescription  Continuous    Liters per minute 2    Compliance with Home Oxygen Use Yes             Oxygen Re-Evaluation:  Oxygen Re-Evaluation     Row Name 10/07/22 1443             Program Oxygen Prescription   Program Oxygen Prescription Continuous       Liters per minute 4       Comments Pt has been increasing oxygen to 4L when exercising         Home Oxygen   Home Oxygen Device Portable Concentrator;Home Concentrator       Sleep Oxygen Prescription  Continuous       Liters per minute 2       Home Exercise Oxygen Prescription Continuous       Liters per minute 2       Home Resting Oxygen Prescription Continuous       Liters per minute 2       Compliance with Home Oxygen Use Yes         Goals/Expected Outcomes   Short Term Goals To learn and exhibit compliance with exercise, home and travel O2 prescription;To learn and understand importance of monitoring SPO2 with pulse oximeter and demonstrate accurate use of the pulse oximeter.;To learn and understand importance of maintaining oxygen saturations>88%;To learn and demonstrate proper pursed lip breathing techniques or other breathing techniques.        [place]  Term Goals Exhibits compliance with exercise, home  and travel O2 prescription;Verbalizes importance of monitoring SPO2 with pulse oximeter and return demonstration;Maintenance of O2 saturations>88%;Exhibits proper breathing techniques, such as pursed lip breathing or other method taught during program session;Compliance with respiratory medication       Goals/Expected Outcomes Compliance                Oxygen Discharge (Final Oxygen Re-Evaluation):  Oxygen Re-Evaluation - 10/07/22 1443       Program Oxygen Prescription   Program Oxygen Prescription Continuous    Liters per minute 4    Comments Pt has been increasing oxygen to 4L when exercising      Home Oxygen   Home Oxygen Device Portable Concentrator;Home Concentrator    Sleep Oxygen Prescription Continuous     Liters per minute 2    Home Exercise Oxygen Prescription Continuous    Liters per minute 2    Home Resting Oxygen Prescription Continuous    Liters per minute 2    Compliance with Home Oxygen Use Yes      Goals/Expected Outcomes   Short Term Goals To learn and exhibit compliance with exercise, home and travel O2 prescription;To learn and understand importance of monitoring SPO2 with pulse oximeter and demonstrate accurate use of the pulse oximeter.;To learn and understand importance of maintaining oxygen saturations>88%;To learn and demonstrate proper pursed lip breathing techniques or other breathing techniques.     [place]  Term Goals Exhibits compliance with exercise, home  and travel O2 prescription;Verbalizes importance of monitoring SPO2 with pulse oximeter and return demonstration;Maintenance of O2 saturations>88%;Exhibits proper breathing techniques, such as pursed lip breathing or other method taught during program session;Compliance with respiratory medication    Goals/Expected Outcomes Compliance             Initial Exercise Prescription:  Initial Exercise Prescription - 09/12/22 1400       Date of Initial Exercise RX and Referring Provider   Date 09/12/22    Referring Provider Dr. Shearon Stalls    Expected Discharge Date 01/15/23      Oxygen   Oxygen Continuous    Liters 2    Maintain Oxygen Saturation 88% or higher      NuStep   Level 1    SPM 60    Minutes 17      Arm Ergometer   Level 1    RPM 50    Minutes 22      Prescription Details   Frequency (times per week) 2    Duration Progress to 30 minutes of continuous aerobic without signs/symptoms of physical distress      Intensity   THRR 40-80% of Max Heartrate  78-469    Ratings of Perceived Exertion 11-13    Perceived Dyspnea 0-4      Resistance Training   Training Prescription Yes    Weight 3    Reps 10-15             Perform Capillary Blood Glucose checks as needed.  Exercise Prescription  Changes:   Exercise Prescription Changes     Row Name 09/23/22 1500 10/07/22 1400           Response to Exercise   Blood Pressure (Admit) 140/60 130/52      Blood Pressure (Exercise) 158/56 152/52      Blood Pressure (Exit) 140/60 132/54      Heart Rate (Admit) 98 bpm 90 bpm      Heart Rate (Exercise) 108 bpm 103 bpm      Heart Rate (Exit) 100 bpm 97 bpm      Oxygen Saturation (Admit) 94 % 92 %      Oxygen Saturation (Exercise) 95 % 90 %      Oxygen Saturation (Exit) 97 % 92 %      Rating of Perceived Exertion (Exercise) 12 13      Perceived Dyspnea (Exercise) 13 13      Duration Continue with 30 min of aerobic exercise without signs/symptoms of physical distress. Continue with 30 min of aerobic exercise without signs/symptoms of physical distress.      Intensity THRR unchanged THRR unchanged        Progression   Progression Continue to progress workloads to maintain intensity without signs/symptoms of physical distress. Continue to progress workloads to maintain intensity without signs/symptoms of physical distress.        Resistance Training   Training Prescription Yes Yes      Weight 3 3      Reps 10-15 10-15      Time 10 Minutes 10 Minutes        Oxygen   Oxygen Continuous Continuous      Liters 3 4        NuStep   Level 1 1      SPM 68 81      Minutes 39 22      METs 1.7 1.8        Arm Ergometer   Level -- 1      RPM -- 43      Minutes -- 17      METs -- 1.8               Exercise Comments:   Exercise Goals and Review:   Exercise Goals     Row Name 09/12/22 1417 10/07/22 1439           Exercise Goals   Increase Physical Activity Yes Yes      Intervention Provide advice, education, support and counseling about physical activity/exercise needs.;Develop an individualized exercise prescription for aerobic and resistive training based on initial evaluation findings, risk stratification, comorbidities and participant's personal goals. Provide  advice, education, support and counseling about physical activity/exercise needs.;Develop an individualized exercise prescription for aerobic and resistive training based on initial evaluation findings, risk stratification, comorbidities and participant's personal goals.      Expected Outcomes Short Term: Attend rehab on a regular basis to increase amount of physical activity.;[place] Term: Add in home exercise to make exercise part of routine and to increase amount of physical activity.;[place] Term: Exercising regularly at least 3-5 days a week. Short Term: Attend rehab on a  regular basis to increase amount of physical activity.;[place] Term: Add in home exercise to make exercise part of routine and to increase amount of physical activity.;[place] Term: Exercising regularly at least 3-5 days a week.      Increase Strength and Stamina Yes Yes      Intervention Provide advice, education, support and counseling about physical activity/exercise needs.;Develop an individualized exercise prescription for aerobic and resistive training based on initial evaluation findings, risk stratification, comorbidities and participant's personal goals. Provide advice, education, support and counseling about physical activity/exercise needs.;Develop an individualized exercise prescription for aerobic and resistive training based on initial evaluation findings, risk stratification, comorbidities and participant's personal goals.      Expected Outcomes Short Term: Increase workloads from initial exercise prescription for resistance, speed, and METs.;Short Term: Perform resistance training exercises routinely during rehab and add in resistance training at home;[place] Term: Improve cardiorespiratory fitness, muscular endurance and strength as measured by increased METs and functional capacity (6MWT) Short Term: Increase workloads from initial exercise prescription for resistance, speed, and METs.;Short Term: Perform resistance training exercises  routinely during rehab and add in resistance training at home;[place] Term: Improve cardiorespiratory fitness, muscular endurance and strength as measured by increased METs and functional capacity (6MWT)      Able to understand and use rate of perceived exertion (RPE) scale Yes Yes      Intervention Provide education and explanation on how to use RPE scale Provide education and explanation on how to use RPE scale      Expected Outcomes Short Term: Able to use RPE daily in rehab to express subjective intensity level;[place] Term:  Able to use RPE to guide intensity level when exercising independently --      Able to understand and use Dyspnea scale Yes Yes      Intervention Provide education and explanation on how to use Dyspnea scale Provide education and explanation on how to use Dyspnea scale      Expected Outcomes [place] Term: Able to use Dyspnea scale to guide intensity level when exercising independently;Short Term: Able to use Dyspnea scale daily in rehab to express subjective sense of shortness of breath during exertion [place] Term: Able to use Dyspnea scale to guide intensity level when exercising independently;Short Term: Able to use Dyspnea scale daily in rehab to express subjective sense of shortness of breath during exertion      Knowledge and understanding of Target Heart Rate Range (THRR) Yes Yes      Intervention Provide education and explanation of THRR including how the numbers were predicted and where they are located for reference Provide education and explanation of THRR including how the numbers were predicted and where they are located for reference      Expected Outcomes Short Term: Able to state/look up THRR;[place] Term: Able to use THRR to govern intensity when exercising independently;Short Term: Able to use daily as guideline for intensity in rehab Short Term: Able to state/look up THRR;[place] Term: Able to use THRR to govern intensity when exercising independently;Short Term: Able to use daily  as guideline for intensity in rehab      Understanding of Exercise Prescription Yes Yes      Intervention Provide education, explanation, and written materials on patient's individual exercise prescription Provide education, explanation, and written materials on patient's individual exercise prescription      Expected Outcomes Short Term: Able to explain program exercise prescription;[place] Term: Able to explain home exercise prescription to exercise independently Short Term: Able to explain program  exercise prescription;[place] Term: Able to explain home exercise prescription to exercise independently               Exercise Goals Re-Evaluation :  Exercise Goals Re-Evaluation     [place] Name 10/07/22 1439             Exercise Goal Re-Evaluation   Exercise Goals Review Increase Strength and Stamina;Increase Physical Activity;Able to understand and use Dyspnea scale;Able to understand and use rate of perceived exertion (RPE) scale;Knowledge and understanding of Target Heart Rate Range (THRR);Understanding of Exercise Prescription       Comments Pt has completed 5 sessions of PR. He has wanted to use the treadmill but due to walking with a walker and him being a high fall risk staff have had to tell him that he is not able to. He does seem to get discouraged during exercise due to his SOB and needing to take breaks to bring hos SpO2 up. He is currently exercising at 1.8 METs on the stepper. Will continue to monitor and progress as able.       Expected Outcomes Through exercise at home and at rehab, the patient will meet their stated goals.                Discharge Exercise Prescription (Final Exercise Prescription Changes):  Exercise Prescription Changes - 10/07/22 1400       Response to Exercise   Blood Pressure (Admit) 130/52    Blood Pressure (Exercise) 152/52    Blood Pressure (Exit) 132/54    Heart Rate (Admit) 90 bpm    Heart Rate (Exercise) 103 bpm    Heart Rate (Exit) 97 bpm     Oxygen Saturation (Admit) 92 %    Oxygen Saturation (Exercise) 90 %    Oxygen Saturation (Exit) 92 %    Rating of Perceived Exertion (Exercise) 13    Perceived Dyspnea (Exercise) 13    Duration Continue with 30 min of aerobic exercise without signs/symptoms of physical distress.    Intensity THRR unchanged      Progression   Progression Continue to progress workloads to maintain intensity without signs/symptoms of physical distress.      Resistance Training   Training Prescription Yes    Weight 3    Reps 10-15    Time 10 Minutes      Oxygen   Oxygen Continuous    Liters 4      NuStep   Level 1    SPM 81    Minutes 22    METs 1.8      Arm Ergometer   Level 1    RPM 43    Minutes 17    METs 1.8             Nutrition:  Target Goals: Understanding of nutrition guidelines, daily intake of sodium <1533m, cholesterol <2045m calories 30% from fat and 7% or less from saturated fats, daily to have 5 or more servings of fruits and vegetables.  Biometrics:  Pre Biometrics - 09/12/22 1417       Pre Biometrics   Height _0  (1.803 m)    Weight 80.4 kg    Waist Circumference 39 inches    Hip Circumference 39 inches    Waist to Hip Ratio 1 %    BMI (Calculated) 24.73    Triceps Skinfold 9 mm    % Body Fat 23.7 %    Grip Strength 40.7 kg    Flexibility 0 in  Single Leg Stand 0 seconds              Nutrition Therapy Plan and Nutrition Goals:  Nutrition Therapy & Goals - 09/12/22 1401       Personal Nutrition Goals   Comments Patient scored 10 on his diet assessment. His wife cooks and says she trys to cook healthy meals. Handout provided and explained to he and his wife regarding healthier choices. We offer 2 educational sessions on heart healthy nutrition with handouts and assistance with RD referral if patient is interested.      Intervention Plan   Intervention Nutrition handout(s) given to patient.    Expected Outcomes Short Term Goal: Understand  basic principles of dietary content, such as calories, fat, sodium, cholesterol and nutrients.             Nutrition Assessments:  Nutrition Assessments - 09/12/22 1401       MEDFICTS Scores   Pre Score 10            MEDIFICTS Score Key: ?70 Need to make dietary changes  40-70 Heart Healthy Diet ? 40 Therapeutic Level Cholesterol Diet   Picture Your Plate Scores: <70 Unhealthy dietary pattern with much room for improvement. 41-50 Dietary pattern unlikely to meet recommendations for good health and room for improvement. 51-60 More healthful dietary pattern, with some room for improvement.  >60 Healthy dietary pattern, although there may be some specific behaviors that could be improved.    Nutrition Goals Re-Evaluation:   Nutrition Goals Discharge (Final Nutrition Goals Re-Evaluation):   Psychosocial: Target Goals: Acknowledge presence or absence of significant depression and/or stress, maximize coping skills, provide positive support system. Participant is able to verbalize types and ability to use techniques and skills needed for reducing stress and depression.  Initial Review & Psychosocial Screening:  Initial Psych Review & Screening - 09/12/22 1426       Initial Review   Current issues with Current Anxiety/Panic      Family Dynamics   Good Support System? Yes      Barriers   Psychosocial barriers to participate in program Psychosocial barriers identified (see note)      Screening Interventions   Interventions Encouraged to exercise;Provide feedback about the scores to participant    Expected Outcomes Short Term goal: Identification and review with participant of any Quality of Life or Depression concerns found by scoring the questionnaire.             Quality of Life Scores:  Quality of Life - 09/12/22 1413       Quality of Life   Select Quality of Life      Quality of Life Scores   Health/Function Pre 6.72 %    Socioeconomic Pre 20.2 %     Psych/Spiritual Pre 7.79 %    Family Pre 27.1 %    GLOBAL Pre 12.08 %            Scores of 19 and below usually indicate a poorer quality of life in these areas.  A difference of  2-3 points is a clinically meaningful difference.  A difference of 2-3 points in the total score of the Quality of Life Index has been associated with significant improvement in overall quality of life, self-image, physical symptoms, and general health in studies assessing change in quality of life.   PHQ-9: Review Flowsheet  More data may exist      09/12/2022 11/12/2021 10/23/2020 12/14/2018 11/10/2018  Depression screen PHQ 2/9  Decreased Interest 0 0 0 3 3  Down, Depressed, Hopeless 0 0 0 3 3  PHQ - 2 Score 0 0 0 6 6  Altered sleeping 3 - - 3 3  Tired, decreased energy 3 - - 3 3  Change in appetite 3 - - 0 0  Feeling bad or failure about yourself  3 - - 2 2  Trouble concentrating 0 - - 0 0  Moving slowly or fidgety/restless 0 - - 0 0  Suicidal thoughts 0 - - 0 0  PHQ-9 Score 12 - - 14 14  Difficult doing work/chores Extremely dIfficult - - - -   Interpretation of Total Score  Total Score Depression Severity:  1-4 = Minimal depression, 5-9 = Mild depression, 10-14 = Moderate depression, 15-19 = Moderately severe depression, 20-27 = Severe depression   Psychosocial Evaluation and Intervention:  Psychosocial Evaluation - 09/12/22 1427       Psychosocial Evaluation & Interventions   Interventions Encouraged to exercise with the program and follow exercise prescription;Stress management education;Relaxation education    Comments Patient has no psychosocial barriers identified at his orientaiton visit. His wife of 37 years was with him and helped answer questions. He scored 13 on his PHQ-9 and low QOL socres mainly due to his chronic COPD that limits his ability to do activities. He says he used to be very active and loved to restore cars but now is not able to do hardly anything. He and his wife are  from [place] and relocated 4 years ago to [place] to be closer to their daughter. They have 5 children, 2 are deceased and 2 are adopted. They have multiple grandchildren. 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 Patient has no diagnosis of anxiety but reports feeling very anxious and panicky when he gets SOB. His wife feels he needs to be treated for anxiety. I encouraged them to speak with their PCP about this and his wife said she would address with Dr. Shearon Stalls. He also has trouble staying asleep but is not taking anything as a sleep aide. Patient is not sure he can exercise for 34 minutes but he wants to try. He is totally sedentary now and wants to participate in the program to gain some endurance and be able to do more activities with less SOB. He is ready to start the program.    Expected Outcomes Patient will continue to have no psychosocial barriers identified.    Continue Psychosocial Services  No Follow up required             Psychosocial Re-Evaluation:  Psychosocial Re-Evaluation     [place] Name 09/30/22 1331             Psychosocial Re-Evaluation   Current issues with Current Anxiety/Panic       Comments Patient is new to program completeing 3 sessions.  He was referred to pulmonary rehab by Dr. Shearon Stalls with COPD.  His initial QOL score is 12.08% and his PHQ-9 score is 13. He seems to enjoy coming to the program and demonstartes an interest in  improving his health and continues to demonstarte a positive out look and attitude.  We will continue to monitor as he progresses in the program.       Expected Outcomes Patient will have no psychosocial issuses identified at discharge.       Interventions Stress management education;Encouraged to attend Pulmonary Rehabilitation for the exercise;Relaxation education       Continue  Psychosocial Services  No Follow up required                Psychosocial Discharge (Final Psychosocial Re-Evaluation):  Psychosocial Re-Evaluation -  09/30/22 1331       Psychosocial Re-Evaluation   Current issues with Current Anxiety/Panic    Comments Patient is new to program completeing 3 sessions.  He was referred to pulmonary rehab by Dr. Shearon Stalls with COPD.  His initial QOL score is 12.08% and his PHQ-9 score is 13. He seems to enjoy coming to the program and demonstartes an interest in  improving his health and continues to demonstarte a positive out look and attitude.  We will continue to monitor as he progresses in the program.    Expected Outcomes Patient will have no psychosocial issuses identified at discharge.    Interventions Stress management education;Encouraged to attend Pulmonary Rehabilitation for the exercise;Relaxation education    Continue Psychosocial Services  No Follow up required              Education: Education Goals: Education classes will be provided on a weekly basis, covering required topics. Participant will state understanding/return demonstration of topics presented.  Learning Barriers/Preferences:  Learning Barriers/Preferences - 09/12/22 1404       Learning Barriers/Preferences   Learning Barriers Hearing   Deaf in right ear.   Learning Preferences Written Material;Audio;Pictoral             Education Topics: How Lungs Work and Diseases: - Discuss the anatomy of the lungs and diseases that can affect the lungs, such as COPD.   Exercise: -Discuss the importance of exercise, FITT principles of exercise, normal and abnormal responses to exercise, and how to exercise safely.   Environmental Irritants: -Discuss types of environmental irritants and how to limit exposure to environmental irritants.   Meds/Inhalers and oxygen: - Discuss respiratory medications, definition of an inhaler and oxygen, and the proper way to use an inhaler and oxygen.   Energy Saving Techniques: - Discuss methods to conserve energy and decrease shortness of breath when performing activities of daily living.     Bronchial Hygiene / Breathing Techniques: - Discuss breathing mechanics, pursed-lip breathing technique,  proper posture, effective ways to clear airways, and other functional breathing techniques   Cleaning Equipment: - Provides group verbal and written instruction about the health risks of elevated stress, cause of high stress, and healthy ways to reduce stress.   Nutrition I: Fats: - Discuss the types of cholesterol, what cholesterol does to the body, and how cholesterol levels can be controlled. Flowsheet Row PULMONARY REHAB CHRONIC OBSTRUCTIVE PULMONARY DISEASE from 10/02/2022 in [facility]  Date 09/18/22  Educator handout  Instruction Review Code 1- Verbalizes Understanding       Nutrition II: Labels: -Discuss the different components of food labels and how to read food labels. Flowsheet Row PULMONARY REHAB CHRONIC OBSTRUCTIVE PULMONARY DISEASE from 10/02/2022 in [facility]  Date 10/02/22  Educator HB  Instruction Review Code 1- Verbalizes Understanding       Respiratory Infections: - Discuss the signs and symptoms of respiratory infections, ways to prevent respiratory infections, and the importance of seeking medical treatment when having a respiratory infection.   Stress I: Signs and Symptoms: - Discuss the causes of stress, how stress may lead to anxiety and depression, and ways to limit stress.   Stress II: Relaxation: -Discuss relaxation techniques to limit stress.   Oxygen for Home/Travel: - Discuss how to prepare for travel  when on oxygen and proper ways to transport and store oxygen to ensure safety.   Knowledge Questionnaire Score:  Knowledge Questionnaire Score - 09/12/22 1405       Knowledge Questionnaire Score   Pre Score 4/18             Core Components/Risk Factors/Patient Goals at Admission:  Personal Goals and Risk Factors at Admission - 09/12/22 1424       Core Components/Risk  Factors/Patient Goals on Admission    Weight Management Weight Maintenance    Improve shortness of breath with ADL's Yes    Intervention Provide education, individualized exercise plan and daily activity instruction to help decrease symptoms of SOB with activities of daily living.    Expected Outcomes Short Term: Improve cardiorespiratory fitness to achieve a reduction of symptoms when performing ADLs;[place] Term: Be able to perform more ADLs without symptoms or delay the onset of symptoms    Hypertension Yes    Intervention Provide education on lifestyle modifcations including regular physical activity/exercise, weight management, moderate sodium restriction and increased consumption of fresh fruit, vegetables, and low fat dairy, alcohol moderation, and smoking cessation.;Monitor prescription use compliance.    Expected Outcomes Short Term: Continued assessment and intervention until BP is < 140/26m HG in hypertensive participants. < 130/866mHG in hypertensive participants with diabetes, heart failure or chronic kidney disease.;[place] Term: Maintenance of blood pressure at goal levels.    Personal Goal Other Yes    Personal Goal Patient wants to be able to walk around more with less SOB and improve his endurance.    Intervention Patient will attend PR 2 days/week with exercise and education.    Expected Outcomes Patient will complete the program meeting both personal and program goals.             Core Components/Risk Factors/Patient Goals Review:   Goals and Risk Factor Review     Row Name 09/30/22 1427             Core Components/Risk Factors/Patient Goals Review   Personal Goals Review Improve shortness of breath with ADL's;Develop more efficient breathing techniques such as purse lipped breathing and diaphragmatic breathing and practicing self-pacing with activity.       Review Patient is new to the program, completeing sessions # 3.  He is referred to PR with COPD via Dr DeShearon Stalls  Patient exercises on 3L of oxygen with O2 stats running between 94%-96% while exercising on the Nu [place]. We will help him with develping a efficient breathing techniques such as purse lipped breathing and practice self pacing with increased activity.  Patient is doing well in program with progression and conisitent attendance.  We wiil encourage his progress as he continues to meet these goals.   His goals are to be able to walk with less SOB and  and improve with endurance.       Expected Outcomes Patient will complete the program meeting both program and personal goals at discharge.                Core Components/Risk Factors/Patient Goals at Discharge (Final Review):   Goals and Risk Factor Review - 09/30/22 1427       Core Components/Risk Factors/Patient Goals Review   Personal Goals Review Improve shortness of breath with ADL's;Develop more efficient breathing techniques such as purse lipped breathing and diaphragmatic breathing and practicing self-pacing with activity.    Review Patient is new to the program, completeing sessions # 3.  He is  referred to PR with COPD via Dr Shearon Stalls.  Patient exercises on 3L of oxygen with O2 stats running between 94%-96% while exercising on the Nu Step. We will help him with develping a efficient breathing techniques such as purse lipped breathing and practice self pacing with increased activity.  Patient is doing well in program with progression and conisitent attendance.  We wiil encourage his progress as he continues to meet these goals.   His goals are to be able to walk with less SOB and  and improve with endurance.    Expected Outcomes Patient will complete the program meeting both program and personal goals at discharge.             ITP Comments:   Comments: ITP REVIEW Pt is making expected progress toward pulmonary rehab goals after completing 6 sessions. Recommend continued exercise, life style modification, education, and utilization of  breathing techniques to increase stamina and strength and decrease shortness of breath with exertion.

## 2022-10-09 ENCOUNTER — Encounter (HOSPITAL_COMMUNITY)
Admission: RE | Admit: 2022-10-09 | Discharge: 2022-10-09 | Disposition: A | Discharge status: Home / Self Care | Payer: Medicare Other | Source: Ambulatory Visit | Attending: Internal Medicine | Admitting: Internal Medicine

## 2022-10-09 DIAGNOSIS — J449 Chronic obstructive pulmonary disease, unspecified: Secondary | ICD-10-CM | POA: Diagnosis not present

## 2022-10-09 NOTE — Progress Notes (Signed)
Daily Session Note  Patient Details  Name: Chase Lloyd MRN: [id number] Date of Birth: 04/06/1956 Referring Provider:   Flowsheet Row PULMONARY REHAB COPD ORIENTATION from 09/12/2022 in [facility]  Referring Provider Dr. Shearon Stalls       Encounter Date: 10/09/2022  Check In:  Session Check In - 10/09/22 1330       Check-In   Supervising physician immediately available to respond to emergencies [facility] MD immediately available    Physician(s) Dr Harl Bowie    Location [facility]    Staff Present Leana Roe, BS, Exercise Physiologist;Jury Caserta Sherrie George, MS, ACSM-CEP    Virtual Visit No    Medication changes reported     No    Fall or balance concerns reported    Yes    Comments Patient uses a standing walker for ambulation.    Tobacco Cessation No Change    Warm-up and Cool-down Performed as group-led instruction    Resistance Training Performed Yes    VAD Patient? No    PAD/SET Patient? No      Pain Assessment   Currently in Pain? No/denies    Pain Score 0-No pain    Multiple Pain Sites No             Capillary Blood Glucose: No results found for this or any previous visit (from the past 24 hour(s)).    Social History   Tobacco Use  Smoking Status Former   Packs/day: 1.50   Years: 47.00   Total pack years: 70.50   Types: Cigarettes   Quit date: 06/13/2018   Years since quitting: 4.3  Smokeless Tobacco Never    Goals Met:  Proper associated with RPD/PD & O2 Sat Independence with exercise equipment Using PLB without cueing & demonstrates good technique Exercise tolerated well Queuing for purse lip breathing No report of concerns or symptoms today Strength training completed today  Goals Unmet:  Not Applicable  Comments: checkout time is 1430   Dr. Kathie Dike is Medical Director for [facility].

## 2022-10-14 ENCOUNTER — Encounter (HOSPITAL_COMMUNITY): Payer: Medicare Other

## 2022-10-15 ENCOUNTER — Encounter (HOSPITAL_COMMUNITY): Payer: Self-pay | Admitting: Internal Medicine

## 2022-10-15 NOTE — Progress Notes (Unsigned)
Cardiology Office Note:    Date:  10/16/2022   ID:  DRAVIN LANCE, DOB Jan 03, 1956, MRN [id number]  PCP:  [name], NP  Cardiologist:  Sinclair Grooms, MD   Referring MD: [name], NP   Chief Complaint  Patient presents with   Congestive Heart Failure   Coronary Artery Disease   Follow-up    COPD    History of Present Illness:    Chase Lloyd is a 66 y.o. male with a hx of chronic systolic heart failure, COPD Gold 3, and CA calcification/LM disease on FFRCTA (04/2019). Cath suggested better to treat medically.    He denies chest pain.  He is not using nitroglycerin.  He stopped taking aspirin and statin therapy.  He no longer smokes.  He stopped taking the medications because he was concerned about how many medications he was taking.  Relatively recently, perhaps 6 months, he has been noting lower extremity swelling.  He has severe COPD and is on chronic oxygen therapy.  Past Medical History:  Diagnosis Date   Arthritis    Back pain    COPD (chronic obstructive pulmonary disease) (HCC)    DDD (degenerative disc disease), lumbar    Depression    Emphysema of lung ([place])    Empyema ([place])    2016  (was in [place])   GERD (gastroesophageal reflux disease)    Hearing loss of right ear    started 2001.     microphone in right ear ,  and hearing aid in left   Hodgkin's disease ([place]) 1999   Hypotension    Meniere's disease    Pneumonia    Tinnitus     Past Surgical History:  Procedure Laterality Date   BACK SURGERY     CERVICAL FUSION  2016   ELBOW SURGERY     EYE SURGERY     IR THORACENTESIS ASP PLEURAL SPACE W/IMG GUIDE  10/08/2018   LAMINECTOMY  1999   lower back surgery     RIGHT/LEFT HEART CATH AND CORONARY ANGIOGRAPHY N/A 04/29/2019   Procedure: RIGHT/LEFT HEART CATH AND CORONARY ANGIOGRAPHY;  Surgeon: Troy Sine, MD;  Location: [facility];  Service: Cardiovascular;  Laterality: N/A;   TONSILLECTOMY      Current  Medications: Current Meds  Medication Sig   albuterol (PROVENTIL) (2.5 MG/3ML) 0.083% nebulizer solution Take 3 mLs (2.5 mg total) by nebulization every 4 (four) hours as needed for wheezing or shortness of breath.   albuterol (VENTOLIN HFA) 108 (90 Base) MCG/ACT inhaler Inhale 2 puffs into the lungs every 6 (six) hours as needed for wheezing or shortness of breath.   [START ON 10/17/2022] aspirin EC 81 MG tablet Take 1 tablet (81 mg total) by mouth 3 (three) times a week. Take on Mondays, Wednesdays, and Fridays. Swallow whole.   ibuprofen (ADVIL) 800 MG tablet TAKE 1 TABLET BY MOUTH EVERY 8 HOURS AS NEEDED   metoprolol succinate (TOPROL-XL) 25 MG 24 hr tablet Take 1 tablet (25 mg total) by mouth daily.   omeprazole (PRILOSEC) 40 MG capsule Take 1 capsule by mouth twice daily (Patient taking differently: Take 40 mg by mouth daily before breakfast.)     Allergies:   Patient has no known allergies.   Social History   Socioeconomic History   Marital status: Married    Spouse name: Not on file   Number of children: Not on file   Years of education: Not on file   Highest  education level: Not on file  Occupational History   Occupation: Retired  Tobacco Use   Smoking status: Former    Packs/day: 1.50    Years: 47.00    Total pack years: 70.50    Types: Cigarettes    Quit date: 06/13/2018    Years since quitting: 4.3   Smokeless tobacco: Never  Vaping Use   Vaping Use: Never used  Substance and Sexual Activity   Alcohol use: Yes    Comment: 24 cans in a week   Drug use: No   Sexual activity: Not on file  Other Topics Concern   Not on file  Social History Narrative   Retied - worked as Furniture conservator/restorer       Social Determinants of Radio broadcast assistant Strain: [place]  (11/12/2021)   Overall Financial Resource Strain (CARDIA)    Difficulty of Paying Living Expenses: Not hard at all  Food Insecurity: No Food Insecurity (11/12/2021)   Hunger Vital Sign    Worried About Running  Out of Food in the Last Year: Never true    [place] in the Last Year: Never true  Transportation Needs: No Transportation Needs (11/12/2021)   PRAPARE - Hydrologist (Medical): No    Lack of Transportation (Non-Medical): No  Physical Activity: Inactive (11/12/2021)   Exercise Vital Sign    Days of Exercise per Week: 0 days    Minutes of Exercise per Session: 0 min  Stress: No Stress Concern Present (11/12/2021)   [place]    Feeling of Stress : Not at all  Social Connections: Moderately Isolated (11/12/2021)   Social Connection and Isolation Panel [NHANES]    Frequency of Communication with Friends and Family: More than three times a week    Frequency of Social Gatherings with Friends and Family: More than three times a week    Attends Religious Services: Never    Marine scientist or Organizations: No    Attends Music therapist: Never    Marital Status: Married     Family History: The patient's family history includes Diabetes in his father; Hypertension in his father; Melanoma in his brother; Testicular cancer in his brother.  ROS:   Please see the history of present illness.    Cough with phlegm production.  Has not seen primary care or Dr. Shearon Stalls of pulmonary recently.  All other systems reviewed and are negative.  EKGs/Labs/Other Studies Reviewed:    The following studies were reviewed today:  Cardiac catheterization 04/29/2019: Diagnostic Dominance: Right    Conclusion    Prox RCA to Mid RCA lesion is 30% stenosed. Ost LM to Mid LM lesion is 50% stenosed. Prox LAD lesion is 20% stenosed. 2nd Diag lesion is 75% stenosed. RPDA lesion is 75% stenosed. The left ventricular systolic function is normal. LV end diastolic pressure is mildly elevated. Mid Cx lesion is 15% stenosed.   Coronary calcification  EKG:  EKG EKG is not showing any evidence of  ischemia.  He has normal sinus rhythm, nonspecific ST-T wave abnormality.  Otherwise unremarkable.  Recent Labs: 11/22/2021: ALT 31; BUN 18; Creatinine, Ser 1.17; Hemoglobin 13.5; Platelets 166.0; Potassium 3.6; Sodium 137  Recent Lipid Panel    Component Value Date/Time   CHOL 173 11/22/2021 1027   CHOL 136 07/01/2019 0741   TRIG 196.0 (H) 11/22/2021 1027   HDL 53.20 11/22/2021 1027   HDL 47  07/01/2019 0741   CHOLHDL 3 11/22/2021 1027   VLDL 39.2 11/22/2021 1027   LDLCALC 81 11/22/2021 1027   LDLCALC 66 07/01/2019 0741    Physical Exam:    VS:  BP 128/62   Pulse 85   Ht '5\' 11"'$  (1.803 m)   Wt 178 lb 3.2 oz (80.8 kg)   SpO2 98%   BMI 24.85 kg/m     Wt Readings from Last 3 Encounters:  10/16/22 178 lb 3.2 oz (80.8 kg)  10/07/22 178 lb 9.2 oz (81 kg)  09/23/22 178 lb 9.2 oz (81 kg)     GEN: Slender, wearing oxygen and appearing chronically ill.. No acute distress HEENT: Normal NECK: No JVD. LYMPHATICS: No lymphadenopathy CARDIAC: No murmur. RRR no gallop, or edema. VASCULAR:  Normal Pulses. No bruits. RESPIRATORY:  Clear to auscultation without rales, wheezing or rhonchi  ABDOMEN: Soft, non-tender, non-distended, No pulsatile mass, MUSCULOSKELETAL: No deformity  SKIN: Warm and dry NEUROLOGIC:  Alert and oriented x 3 PSYCHIATRIC:  Normal affect   ASSESSMENT:    1. Coronary artery disease involving native coronary artery of native heart with other form of angina pectoris ([place])   2. Essential hypertension   3. Intermittent claudication ([place])   4. COPD with acute exacerbation ([place])   5. Former cigarette smoker - 70 pack year hx   6. Lower extremity edema    PLAN:    In order of problems listed above:  Reinstitute statin therapy and aspirin.  Liver and lipid panel in 6 to 8 weeks.  We discussed secondary prevention. Blood pressure is under good control on very low-dose metoprolol. We did not discuss his intermittent claudication other than to acknowledge secondary  prevention will also help to prevent progression of PAD. He will follow-up with his pulmonologist Does not currently smoke. Possibly related to cor pulmonale.  2D Doppler echocardiogram will be done to assess left and right heart size and function.  Overall education and awareness concerning primary/secondary risk prevention was discussed in detail: LDL less than 70, hemoglobin A1c less than 7, blood pressure target less than 130/80 mmHg, >150 minutes of moderate aerobic activity per week, avoidance of smoking, weight control (via diet and exercise), and continued surveillance/management of/for obstructive sleep apnea.     Medication Adjustments/Labs and Tests Ordered: Current medicines are reviewed at length with the patient today.  Concerns regarding medicines are outlined above.  Orders Placed This Encounter  Procedures   Lipid panel   Hepatic function panel   EKG 12-Lead   ECHOCARDIOGRAM COMPLETE   Meds ordered this encounter  Medications   rosuvastatin (CRESTOR) 10 MG tablet    Sig: Take 1 tablet (10 mg total) by mouth daily.    Dispense:  90 tablet    Refill:  3   aspirin EC 81 MG tablet    Sig: Take 1 tablet (81 mg total) by mouth 3 (three) times a week. Take on Mondays, Wednesdays, and Fridays. Swallow whole.    Patient Instructions  Medication Instructions:  Your physician has recommended you make the following change in your medication:   1) START rosuvastatin (Crestor) '10mg'$  daily 2) START aspirin '81mg'$  on Mondays, Wednesdays, and Fridays (enteric coated)  *If you need a refill on your cardiac medications before your next appointment, please call your pharmacy*  Lab Work: In 6 weeks: fasting Lipid and Liver panel If you have labs (blood work) drawn today and your tests are completely normal, you will receive your results only by: [employer] (  if you have MyChart) OR A paper copy in the mail If you have any lab test that is abnormal or we need to change your  treatment, we will call you to review the results.   Testing/Procedures: Your physician has requested that you have an echocardiogram. Echocardiography is a painless test that uses sound waves to create images of your heart. It provides your doctor with information about the size and shape of your heart and how well your heart's chambers and valves are working. This procedure takes approximately one hour. There are no restrictions for this procedure. Please do NOT wear cologne, perfume, aftershave, or lotions (deodorant is allowed). Please arrive 15 minutes prior to your appointment time.  Follow-Up: At [facility], you and your health needs are our priority.  As part of our continuing mission to provide you with exceptional heart care, we have created designated Provider Care Teams.  These Care Teams include your primary Cardiologist (physician) and Advanced Practice Providers (APPs -  Physician Assistants and Nurse Practitioners) who all work together to provide you with the care you need, when you need it.  Your next appointment:   8 week(s)  The format for your next appointment:   In Person  Provider:   Nicholes Rough, PA-C, Ambrose Pancoast, NP, Christen Bame, NP, Richardson Dopp, PA-C or Melina Copa, PA-C  Important Information About Sugar         Signed, Sinclair Grooms, MD  10/16/2022 2:55 PM    [place]

## 2022-10-16 ENCOUNTER — Encounter: Payer: Self-pay | Admitting: Interventional Cardiology

## 2022-10-16 ENCOUNTER — Encounter (HOSPITAL_COMMUNITY): Payer: Medicare Other

## 2022-10-16 ENCOUNTER — Ambulatory Visit: Payer: Medicare Other | Attending: Interventional Cardiology | Admitting: Interventional Cardiology

## 2022-10-16 VITALS — BP 128/62 | HR 85 | Ht 71.0 in | Wt 178.2 lb

## 2022-10-16 DIAGNOSIS — J441 Chronic obstructive pulmonary disease with (acute) exacerbation: Secondary | ICD-10-CM

## 2022-10-16 DIAGNOSIS — I739 Peripheral vascular disease, unspecified: Secondary | ICD-10-CM | POA: Diagnosis not present

## 2022-10-16 DIAGNOSIS — Z87891 Personal history of nicotine dependence: Secondary | ICD-10-CM | POA: Insufficient documentation

## 2022-10-16 DIAGNOSIS — R600 Localized edema: Secondary | ICD-10-CM | POA: Diagnosis not present

## 2022-10-16 DIAGNOSIS — I25118 Atherosclerotic heart disease of native coronary artery with other forms of angina pectoris: Secondary | ICD-10-CM | POA: Diagnosis not present

## 2022-10-16 DIAGNOSIS — I10 Essential (primary) hypertension: Secondary | ICD-10-CM | POA: Insufficient documentation

## 2022-10-16 MED ORDER — ASPIRIN 81 MG PO TBEC: 81.0000 mg | DELAYED_RELEASE_TABLET | ORAL | Status: DC

## 2022-10-16 MED ORDER — METOPROLOL SUCCINATE ER 25 MG PO TB24: 25.0000 mg | ORAL_TABLET | Freq: Every day | ORAL | 3 refills | Status: DC

## 2022-10-16 MED ORDER — ROSUVASTATIN CALCIUM 10 MG PO TABS: 10.0000 mg | ORAL_TABLET | Freq: Every day | ORAL | 3 refills | Status: DC

## 2022-10-16 NOTE — Addendum Note (Signed)
Addended by: Molli Barrows on: 10/16/2022 02:59 PM   Modules accepted: Orders

## 2022-10-16 NOTE — Progress Notes (Signed)
Attempted to obtain medical history via telephone, unable to reach at this time. HIPAA compliant voicemail message left requesting return call to pre surgical testing department. 

## 2022-10-16 NOTE — Patient Instructions (Signed)
Medication Instructions:  Your physician has recommended you make the following change in your medication:   1) START rosuvastatin (Crestor) '10mg'$  daily 2) START aspirin '81mg'$  on Mondays, Wednesdays, and Fridays (enteric coated)  *If you need a refill on your cardiac medications before your next appointment, please call your pharmacy*  Lab Work: In 6 weeks: fasting Lipid and Liver panel If you have labs (blood work) drawn today and your tests are completely normal, you will receive your results only by: [place] (if you have MyChart) OR A paper copy in the mail If you have any lab test that is abnormal or we need to change your treatment, we will call you to review the results.   Testing/Procedures: Your physician has requested that you have an echocardiogram. Echocardiography is a painless test that uses sound waves to create images of your heart. It provides your doctor with information about the size and shape of your heart and how well your heart's chambers and valves are working. This procedure takes approximately one hour. There are no restrictions for this procedure. Please do NOT wear cologne, perfume, aftershave, or lotions (deodorant is allowed). Please arrive 15 minutes prior to your appointment time.  Follow-Up: At [facility], you and your health needs are our priority.  As part of our continuing mission to provide you with exceptional heart care, we have created designated Provider Care Teams.  These Care Teams include your primary Cardiologist (physician) and Advanced Practice Providers (APPs -  Physician Assistants and Nurse Practitioners) who all work together to provide you with the care you need, when you need it.  Your next appointment:   8 week(s)  The format for your next appointment:   In Person  Provider:   Nicholes Rough, PA-C, Ambrose Pancoast, NP, Christen Bame, NP, Richardson Dopp, PA-C or Melina Copa, PA-C  Important Information About  [place]

## 2022-10-17 ENCOUNTER — Other Ambulatory Visit: Payer: Self-pay

## 2022-10-17 ENCOUNTER — Telehealth: Payer: Self-pay

## 2022-10-17 NOTE — Telephone Encounter (Signed)
Dr. [name], you saw this patient on 12/14. Do not see documentation that procedure was discussed. Do you agree that he can proceed with EGD/colonscopy without further cardiac testing?  Per office protocol, he may hold aspirin for 5-7 days prior to procedure if requested.   Please route your response to p cv div preop.  Thank you, Sharyn Lull

## 2022-10-17 NOTE — Progress Notes (Signed)
[facility] Telephone Encounter  Will contact patient for Pulmonary follow-up for Pulm optimization prior to GI procedure.  Rodman Pickle, M.D. [facility] Pulmonary/Critical Care Medicine 10/17/2022 3:00 PM   See Amion for personal pager For hours between 7 PM to 7 AM, please call Elink for urgent questions

## 2022-10-17 NOTE — Telephone Encounter (Signed)
[facility] Pre-operative Risk Assessment     Request for surgical clearance:     Endoscopy Procedure  What type of surgery is being performed?     EGD and colonoscopy at [facility]  When is this surgery scheduled?     10/23/22  What type of clearance is required ?   Medical  Are there any medications that need to be held prior to surgery and how [place]? no  Practice name and name of physician performing surgery?      [facility]  What is your office phone and fax number?      Phone- [phone]  [phone]  Anesthesia type (None, local, MAC, general) ?       MAC

## 2022-10-20 ENCOUNTER — Ambulatory Visit (INDEPENDENT_AMBULATORY_CARE_PROVIDER_SITE_OTHER): Payer: Medicare Other

## 2022-10-20 ENCOUNTER — Encounter (HOSPITAL_BASED_OUTPATIENT_CLINIC_OR_DEPARTMENT_OTHER): Payer: Self-pay | Admitting: Pulmonary Disease

## 2022-10-20 ENCOUNTER — Ambulatory Visit (INDEPENDENT_AMBULATORY_CARE_PROVIDER_SITE_OTHER): Payer: Medicare Other | Admitting: Pulmonary Disease

## 2022-10-20 VITALS — BP 162/58 | HR 82 | Wt 174.4 lb

## 2022-10-20 DIAGNOSIS — J441 Chronic obstructive pulmonary disease with (acute) exacerbation: Secondary | ICD-10-CM

## 2022-10-20 DIAGNOSIS — J90 Pleural effusion, not elsewhere classified: Secondary | ICD-10-CM | POA: Diagnosis not present

## 2022-10-20 DIAGNOSIS — R0602 Shortness of breath: Secondary | ICD-10-CM | POA: Diagnosis not present

## 2022-10-20 DIAGNOSIS — J9611 Chronic respiratory failure with hypoxia: Secondary | ICD-10-CM | POA: Diagnosis not present

## 2022-10-20 DIAGNOSIS — J189 Pneumonia, unspecified organism: Secondary | ICD-10-CM | POA: Diagnosis not present

## 2022-10-20 DIAGNOSIS — Z01818 Encounter for other preprocedural examination: Secondary | ICD-10-CM | POA: Diagnosis not present

## 2022-10-20 MED ORDER — PREDNISONE 10 MG PO TABS: ORAL_TABLET | ORAL | 0 refills | Status: AC

## 2022-10-20 MED ORDER — ALBUTEROL SULFATE (2.5 MG/3ML) 0.083% IN NEBU: 2.5000 mg | INHALATION_SOLUTION | RESPIRATORY_TRACT | 12 refills | Status: DC | PRN

## 2022-10-20 MED ORDER — LEVOFLOXACIN 750 MG PO TABS: 750.0000 mg | ORAL_TABLET | Freq: Every day | ORAL | 0 refills | Status: AC

## 2022-10-20 NOTE — Patient Instructions (Signed)
Plan/Recommendations: START levaquin 750 mg x 7 days START prednisone taper Please call GI doctor to postpone procedure for at least 1 month CONTINUE Breztri 2 puffs twice a day. Continue albuterol as needed.  CONTINUE pulmonary rehab Increased O2 to 3L today to maintain SpO2 >88% Complete pulmonary rehab. At next evaluation, re-discuss eligibility referral for transplant evaluation  Follow-up with 2 months with me or Dr. Shearon Stalls

## 2022-10-20 NOTE — Progress Notes (Signed)
Chase Lloyd    [id number]    [birth year]  Primary Care Physician:Nafziger, Tommi Rumps, NP Date of Appointment: 10/20/2022 Established Patient Visit  Chief complaint:   Chief Complaint  Patient presents with   Follow-up    Surgery clearance      HPI: Chase Lloyd is a 66 y.o. man with COPD on home oxygen and AVN of the left hip.    Interval Updates: Since his last visit he has been participating in Pulmonary Rehab at [facility]. Initially doing well however beg having increased shortness of breath, requiring nebulizer twice daily. He has also needed to increase his home O2 of 2L to 3L. He is also here for pre-op clearance for EGD/colonoscopy.  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  Used to live in [place] 70+ pack year smoking history, quit 2019  Past Medical History:  Diagnosis Date   Arthritis    Back pain    COPD (chronic obstructive pulmonary disease) (HCC)    DDD (degenerative disc disease), lumbar    Depression    Emphysema of lung ([place])    Empyema ([place])    2016  (was in [place])   GERD (gastroesophageal reflux disease)    Hearing loss of right ear    started 2001.     microphone in right ear ,  and hearing aid in left   Hodgkin's disease ([place]) 1999   Hypotension    Meniere's disease    Pneumonia    Tinnitus     Past Surgical History:  Procedure Laterality Date   BACK SURGERY     CERVICAL FUSION  2016   ELBOW SURGERY     EYE SURGERY     IR THORACENTESIS ASP PLEURAL SPACE W/IMG GUIDE  10/08/2018   LAMINECTOMY  1999   lower back surgery     RIGHT/LEFT HEART CATH AND CORONARY ANGIOGRAPHY N/A 04/29/2019   Procedure: RIGHT/LEFT HEART CATH AND CORONARY ANGIOGRAPHY;  Surgeon: Chase Sine, MD;   Location: [facility];  Service: Cardiovascular;  Laterality: N/A;   TONSILLECTOMY      Family History  Problem Relation Age of Onset   Hypertension Father    Diabetes Father    Testicular cancer Brother    Melanoma Brother     Social History   Occupational History   Occupation: Retired  Tobacco Use   Smoking status: Former    Packs/day: 1.50    Years: 47.00    Total pack years: 70.50    Types: Cigarettes    Quit date: 06/13/2018    Years since quitting: 4.3   Smokeless tobacco: Never  Vaping Use   Vaping Use: Never used  Substance and Sexual Activity   Alcohol use: Yes    Comment: 24 cans in a week   Drug use: No   Sexual activity: Not on file   Physical Exam: Pulse 82, weight 174 lb 6.1 oz (79.1 kg), SpO2 91 %.  Gen:      No acute distress, chronically ill appearing Lungs:   diminished, short of breath with conversation CV:         tachycardic, regular Neuro: normal speech, no focal facial asymmetry   Data Reviewed: Imaging:  CT Chest Lung Screen Cancer 11/12/21 - biapical pleuroparenchymal scarring. Bullous centrilobular and paraseptal emphysema with upper lobe predominance. Post- radiation in right upper lobe, right fibrothorax  Imaging been reviewed independently by me.   PFTs:      Latest Ref Rng & Units 08/28/2022    3:47 PM 10/22/2018    1:59 PM 07/30/2017   11:53 AM  PFT Results  FVC-Pre L 1.90  2.42  2.45   FVC-Predicted Pre % 40  50  50   FVC-Post L 2.11  2.87  2.90   FVC-Predicted Post % 45  59  59   Pre FEV1/FVC % % 41  46  46   Post FEV1/FCV % % 45  44  48   FEV1-Pre L 0.79  1.10  1.13   FEV1-Predicted Pre % '22  30  31   '$ FEV1-Post L 0.94  1.26  1.39   DLCO uncorrected ml/min/mmHg 6.91  12.79  13.19   DLCO UNC% % 25  37  39   DLCO corrected ml/min/mmHg 6.91  14.51  14.16   DLCO COR %Predicted % 25  43  42   DLVA Predicted % 41  67  59   TLC L 4.65  5.86    TLC % Predicted % 64  81    RV % Predicted % 90  129     I have  personally reviewed the patient's PFTs and they are notable for very severe emphysema, with an FEV1 of 30% of predicted.  These results are consistent when trended over 2018 in 2019.  Labs:  Lab Results  Component Value Date   WBC 8.3 11/22/2021   HGB 13.5 11/22/2021   HCT 40.1 11/22/2021   MCV 92.4 11/22/2021   PLT 166.0 11/22/2021   Lab Results  Component Value Date   NA 137 11/22/2021   K 3.6 11/22/2021   CL 96 11/22/2021   CO2 36 (H) 11/22/2021   A1AT within normal range.  Elevated CO2 and ABG with hypercapnia  Immunization status: Immunization History  Administered Date(s) Administered   Fluad Quad(high Dose 65+) 09/02/2019   Influenza,inj,Quad PF,6+ Mos 12/27/2015, 10/11/2018   Pneumococcal Conjugate-13 12/26/2015   Td 01/01/2014   Zoster, Live 08/04/2015     Assessment:  Very Severe Emphysema FEV1 30% of predicted COPD exacerbation 2/2 pneumonia Acute on chronic Respiratory Failure (with hypoxemia and hypercapnia) on increased 3LNC, baseline 2L Avascular necrosis, left hip Hodgkin's disease status post mantle field radiation, now in remission   Plan/Recommendations: START levaquin 750 mg x 7 days START prednisone taper Please call GI doctor to postpone procedure for at least 1 month CONTINUE Breztri 2 puffs twice a day. Continue albuterol as needed.  CONTINUE pulmonary rehab Increased O2 to 3L today to maintain SpO2 >88% Complete pulmonary rehab. At next evaluation, re-discuss eligibility referral for transplant evaluation   Peri-operative Assessment of Pulmonary Risk for Non-Thoracic Surgery:  For Chase Lloyd, risk of perioperative pulmonary complications is increased by:  Age greater than 82 years  COPD   Calculated ARISCAT 28 points: Intermediate risk for post-procedure pulmonary complications.  Respiratory complications generally occur in 1% of ASA Class I patients, 5% of ASA Class II and 10% of ASA Class III-IV patients These complications rarely  result in mortality and include postoperative pneumonia, atelectasis, pulmonary embolism, ARDS and increased time requiring postoperative mechanical ventilation.  Overall, I recommend proceeding with the surgery once his acute respiratory illness resolves. Ok to proceed if the risk for respiratory complications  are outweighed by the potential benefits. This will need to be discussed between the patient and surgeon.  To reduce risks of respiratory complications, I recommend: --Pre- and post-operative incentive spirometry performed frequently while awake --Avoiding use of pancuronium during anesthesia.  I have discussed the risk factors and recommendations above with the patient.  I have spent a total time of 33-minutes on the day of the appointment including chart review, data review, collecting history, coordinating care and discussing medical diagnosis and plan with the patient/family. Past medical history, allergies, medications were reviewed. Pertinent imaging, labs and tests included in this note have been reviewed and interpreted independently by me.  Return to Care: No follow-ups on file.   Rodman Pickle, MD Pulmonary and [place]

## 2022-10-20 NOTE — Telephone Encounter (Signed)
   Patient Name: Chase Lloyd  DOB: 1956-06-27 MRN: [id number]  Primary Cardiologist: Sinclair Grooms, MD  Chart reviewed as part of pre-operative protocol coverage. Patient was recently seen by Dr. [name] on 10/16/2022 at which time he denied any chest pain. He has severe COPD and is on chronic O2 therapy. He did report some lower extremity edema so Echo was ordered to evaluation LV and RV size and function. This is currently scheduled for 11/17/2022. Per Dr. [name], patient is high risk for planned procedure due to respiratory failure and chronic O2 therapy. However, no additional cardiac work-up necessary prior to procedure.   I will route this recommendation to the requesting party via Epic fax function and remove from pre-op pool.  Please call with questions.  Darreld Mclean, PA-C 10/20/2022, 8:44 AM

## 2022-10-21 ENCOUNTER — Encounter (HOSPITAL_COMMUNITY): Payer: Medicare Other

## 2022-10-21 ENCOUNTER — Telehealth: Payer: Self-pay

## 2022-10-21 DIAGNOSIS — L814 Other melanin hyperpigmentation: Secondary | ICD-10-CM | POA: Diagnosis not present

## 2022-10-21 DIAGNOSIS — L7211 Pilar cyst: Secondary | ICD-10-CM | POA: Diagnosis not present

## 2022-10-21 DIAGNOSIS — L821 Other seborrheic keratosis: Secondary | ICD-10-CM | POA: Diagnosis not present

## 2022-10-21 DIAGNOSIS — L578 Other skin changes due to chronic exposure to nonionizing radiation: Secondary | ICD-10-CM | POA: Diagnosis not present

## 2022-10-21 DIAGNOSIS — D225 Melanocytic nevi of trunk: Secondary | ICD-10-CM | POA: Diagnosis not present

## 2022-10-21 NOTE — Telephone Encounter (Signed)
Patient has pneumonia. The pulmonologist does not give clearance for the colonoscopy 10/23/22. It is advised to wait for a least a month. I have spoken with the spouse. She asks to wait until March before proceeding. She is wanting to wait until after he follows up with pulmonology in late February.

## 2022-10-23 ENCOUNTER — Ambulatory Visit (HOSPITAL_COMMUNITY): Admission: RE | Admit: 2022-10-23 | Payer: Medicare Other | Source: Home / Self Care | Admitting: Internal Medicine

## 2022-10-23 ENCOUNTER — Encounter (HOSPITAL_COMMUNITY): Payer: Medicare Other

## 2022-10-23 SURGERY — ESOPHAGOGASTRODUODENOSCOPY (EGD) WITH PROPOFOL
Anesthesia: Monitor Anesthesia Care

## 2022-10-24 ENCOUNTER — Ambulatory Visit (HOSPITAL_BASED_OUTPATIENT_CLINIC_OR_DEPARTMENT_OTHER): Payer: Medicare Other | Admitting: Pulmonary Disease

## 2022-10-28 ENCOUNTER — Encounter (HOSPITAL_COMMUNITY)
Admission: RE | Admit: 2022-10-28 | Discharge: 2022-10-28 | Disposition: A | Discharge status: Home / Self Care | Payer: Medicare Other | Source: Ambulatory Visit | Attending: Internal Medicine | Admitting: Internal Medicine

## 2022-10-28 DIAGNOSIS — J449 Chronic obstructive pulmonary disease, unspecified: Secondary | ICD-10-CM

## 2022-10-28 NOTE — Progress Notes (Signed)
Daily Session Note  Patient Details  Name: Chase Lloyd MRN: [id number] Date of Birth: August 19, 1956 Referring Provider:   Flowsheet Row PULMONARY REHAB COPD ORIENTATION from 09/12/2022 in [place]  Referring Provider Dr. Shearon Stalls       Encounter Date: 10/28/2022  Check In:  Session Check In - 10/28/22 1330       Check-In   Supervising physician immediately available to respond to emergencies [facility] MD immediately available    Physician(s) Dr. Harl Bowie    Location [facility]    Staff Present Leana Roe, BS, Exercise Physiologist;Dalton Sherrie George, MS, ACSM-CEP;Geanie Cooley, RN    Virtual Visit No    Medication changes reported     No    Fall or balance concerns reported    Yes    Comments Patient uses a standing walker for ambulation.    Tobacco Cessation No Change    Warm-up and Cool-down Performed as group-led instruction    Resistance Training Performed Yes    VAD Patient? No    PAD/SET Patient? No      Pain Assessment   Currently in Pain? No/denies    Pain Score 0-No pain    Multiple Pain Sites No             Capillary Blood Glucose: No results found for this or any previous visit (from the past 24 hour(s)).    Social History   Tobacco Use  Smoking Status Former   Packs/day: 1.50   Years: 47.00   Total pack years: 70.50   Types: Cigarettes   Quit date: 06/13/2018   Years since quitting: 4.3  Smokeless Tobacco Never    Goals Met:  Proper associated with RPD/PD & O2 Sat Independence with exercise equipment Using PLB without cueing & demonstrates good technique Exercise tolerated well No report of concerns or symptoms today Strength training completed today  Goals Unmet:  Not Applicable  Comments: checkout @ 2:30pm   Dr. Kathie Dike is Medical Director for [facility] Pulmonary Rehab.

## 2022-10-30 ENCOUNTER — Encounter (HOSPITAL_COMMUNITY)
Admission: RE | Admit: 2022-10-30 | Discharge: 2022-10-30 | Disposition: A | Discharge status: Home / Self Care | Payer: Medicare Other | Source: Ambulatory Visit | Attending: Internal Medicine | Admitting: Internal Medicine

## 2022-10-30 DIAGNOSIS — J449 Chronic obstructive pulmonary disease, unspecified: Secondary | ICD-10-CM

## 2022-10-30 NOTE — Progress Notes (Signed)
Daily Session Note  Patient Details  Name: CANAAN HOLZER MRN: [id number] Date of Birth: 1956-01-26 Referring Provider:   Flowsheet Row PULMONARY REHAB COPD ORIENTATION from 09/12/2022 in [place]  Referring Provider Dr. Shearon Stalls       Encounter Date: 10/30/2022  Check In:  Session Check In - 10/30/22 1325       Check-In   Supervising physician immediately available to respond to emergencies [facility] MD immediately available    Physician(s) Dr. Johnsie Cancel    Location [facility]    Staff Present Hoy Register MHA, MS, ACSM-CEP;[employer] BSN, RN;Debra Wynetta Emery, RN, BSN    Virtual Visit No    Medication changes reported     No    Fall or balance concerns reported    Yes    Comments Patient uses a standing walker for ambulation.    Tobacco Cessation No Change    Warm-up and Cool-down Performed as group-led instruction    Resistance Training Performed Yes    VAD Patient? No    PAD/SET Patient? No      Pain Assessment   Currently in Pain? No/denies    Pain Score 0-No pain    Multiple Pain Sites No             Capillary Blood Glucose: No results found for this or any previous visit (from the past 24 hour(s)).    Social History   Tobacco Use  Smoking Status Former   Packs/day: 1.50   Years: 47.00   Total pack years: 70.50   Types: Cigarettes   Quit date: 06/13/2018   Years since quitting: 4.3  Smokeless Tobacco Never    Goals Met:  Proper associated with RPD/PD & O2 Sat Independence with exercise equipment Using PLB without cueing & demonstrates good technique Exercise tolerated well Queuing for purse lip breathing No report of concerns or symptoms today Strength training completed today  Goals Unmet:  Not Applicable  Comments: check out at 2:30   Dr. Kathie Dike is Medical Director for [facility] Pulmonary Rehab.

## 2022-11-04 ENCOUNTER — Encounter (HOSPITAL_COMMUNITY)
Admission: RE | Admit: 2022-11-04 | Discharge: 2022-11-04 | Disposition: A | Discharge status: Home / Self Care | Payer: Medicare Other | Source: Ambulatory Visit | Attending: Internal Medicine | Admitting: Internal Medicine

## 2022-11-04 VITALS — Wt 169.3 lb

## 2022-11-04 DIAGNOSIS — J449 Chronic obstructive pulmonary disease, unspecified: Secondary | ICD-10-CM | POA: Insufficient documentation

## 2022-11-04 NOTE — Progress Notes (Signed)
Daily Session Note  Patient Details  Name: Chase Lloyd MRN: [id number] Date of Birth: 10-14-1956 Referring Provider:   Flowsheet Row PULMONARY REHAB COPD ORIENTATION from 09/12/2022 in [place]  Referring Provider Dr. Shearon Stalls       Encounter Date: 11/04/2022  Check In:  Session Check In - 11/04/22 1329       Check-In   Supervising physician immediately available to respond to emergencies [facility] MD immediately available    Physician(s) Dr Domenic Polite    Location [facility]    Staff Present Leana Roe, BS, Exercise Physiologist;Phyllis Billingsley, RN;Isais Klipfel Hassell Done, RN, BSN    Virtual Visit No    Medication changes reported     No    Fall or balance concerns reported    Yes    Comments Patient uses a standing walker for ambulation.    Tobacco Cessation No Change    Warm-up and Cool-down Performed as group-led instruction    Resistance Training Performed Yes    VAD Patient? No    PAD/SET Patient? No      Pain Assessment   Currently in Pain? No/denies    Pain Score 0-No pain    Multiple Pain Sites No             Capillary Blood Glucose: No results found for this or any previous visit (from the past 24 hour(s)).    Social History   Tobacco Use  Smoking Status Former   Packs/day: 1.50   Years: 47.00   Total pack years: 70.50   Types: Cigarettes   Quit date: 06/13/2018   Years since quitting: 4.3  Smokeless Tobacco Never    Goals Met:  Proper associated with RPD/PD & O2 Sat Independence with exercise equipment Using PLB without cueing & demonstrates good technique Exercise tolerated well Queuing for purse lip breathing No report of concerns or symptoms today Strength training completed today  Goals Unmet:  Not Applicable  Comments: Checkout at 1430.   Dr. Kathie Dike is Medical Director for [facility] Pulmonary Rehab.

## 2022-11-05 ENCOUNTER — Other Ambulatory Visit: Payer: Self-pay | Admitting: Adult Health

## 2022-11-05 NOTE — Progress Notes (Signed)
Pulmonary Individual Treatment Plan  Patient Details  Name: Chase Lloyd MRN: [id number] Date of Birth: 07-Feb-1956 Referring Provider:   Flowsheet Row PULMONARY REHAB COPD ORIENTATION from 09/12/2022 in [facility]  Referring Provider Dr. Shearon Stalls       Initial Encounter Date:  Flowsheet Row PULMONARY REHAB COPD ORIENTATION from 09/12/2022 in [facility]  Date 09/12/22       Visit Diagnosis: Stage 4 very severe COPD by GOLD classification ([place])  Patient's Home Medications on Admission:   Current Outpatient Medications:    albuterol (PROVENTIL) (2.5 MG/3ML) 0.083% nebulizer solution, Take 3 mLs (2.5 mg total) by nebulization every 4 (four) hours as needed for wheezing or shortness of breath., Disp: 75 mL, Rfl: 12   albuterol (VENTOLIN HFA) 108 (90 Base) MCG/ACT inhaler, Inhale 2 puffs into the lungs every 6 (six) hours as needed for wheezing or shortness of breath., Disp: 8 g, Rfl: 0   aspirin 81 MG chewable tablet, Chew 81 mg by mouth daily., Disp: , Rfl:    Budeson-Glycopyrrol-Formoterol (BREZTRI AEROSPHERE) 160-9-4.8 MCG/ACT AERO, Inhale 2 puffs into the lungs in the morning and at bedtime., Disp: , Rfl:    furosemide (LASIX) 20 MG tablet, Take 20 mg by mouth daily., Disp: , Rfl:    ibuprofen (ADVIL) 800 MG tablet, TAKE 1 TABLET BY MOUTH EVERY 8 HOURS AS NEEDED, Disp: 270 tablet, Rfl: 0   metoprolol succinate (TOPROL-XL) 25 MG 24 hr tablet, Take 1 tablet (25 mg total) by mouth daily., Disp: 90 tablet, Rfl: 3   omeprazole (PRILOSEC) 40 MG capsule, Take 1 capsule by mouth twice daily (Patient taking differently: Take 40 mg by mouth daily before breakfast.), Disp: 180 capsule, Rfl: 0   rosuvastatin (CRESTOR) 10 MG tablet, Take 1 tablet (10 mg total) by mouth daily., Disp: 90 tablet, Rfl: 3   sodium chloride (OCEAN) 0.65 % SOLN nasal spray, Place 1 spray into both nostrils as needed for congestion., Disp: , Rfl:   Past Medical History: Past  Medical History:  Diagnosis Date   Arthritis    Back pain    COPD (chronic obstructive pulmonary disease) (HCC)    DDD (degenerative disc disease), lumbar    Depression    Emphysema of lung ([place])    Empyema ([place])    2016  (was in [place])   GERD (gastroesophageal reflux disease)    Hearing loss of right ear    started 2001.     microphone in right ear ,  and hearing aid in left   Hodgkin's disease ([place]) 1999   Hypotension    Meniere's disease    Pneumonia    Tinnitus     Tobacco Use: Social History   Tobacco Use  Smoking Status Former   Packs/day: 1.50   Years: 47.00   Total pack years: 70.50   Types: Cigarettes   Quit date: 06/13/2018   Years since quitting: 4.4  Smokeless Tobacco Never    Labs: Review Flowsheet       Latest Ref Rng & Units 05/05/2018 04/29/2019 07/01/2019 11/22/2021  Labs for ITP Cardiac and Pulmonary Rehab  Cholestrol 0 - 200 mg/dL 182  - 136  173   LDL (calc) 0 - 99 mg/dL 91  - 66  81   HDL-C >39.00 mg/dL 60.50  - 47  53.20   Trlycerides 0.0 - 149.0 mg/dL 153.0  - 116  196.0   PH, Arterial 7.350 - 7.450 - 7.338  - -  PCO2  arterial 32.0 - 48.0 mmHg - 54.3  - -  Bicarbonate 20.0 - 28.0 mmol/L - 29.2  30.0  31.0  - -  TCO2 22 - 32 mmol/L - 31  32  33  - -  O2 Saturation % - 100.0  73.0  72.0  - -    Capillary Blood Glucose: No results found for: "GLUCAP"   Pulmonary Assessment Scores:  Pulmonary Assessment Scores     Row Name 09/12/22 1422         ADL UCSD   ADL Phase Entry     SOB Score total 66     Rest 1     Walk 2     Stairs 4     Bath 3     Dress 2     Shop 1       CAT Score   CAT Score 30       mMRC Score   mMRC Score 3             UCSD: Self-administered rating of dyspnea associated with activities of daily living (ADLs) 6-point scale (0 = "not at all" to 5 = "maximal or unable to do because of breathlessness")  Scoring Scores range from 0 to 120.  Minimally important difference is 5 units  CAT: CAT can  identify the health impairment of COPD patients and is better correlated with disease progression.  CAT has a scoring range of zero to 40. The CAT score is classified into four groups of low (less than 10), medium (10 - 20), high (21-30) and very high (31-40) based on the impact level of disease on health status. A CAT score over 10 suggests significant symptoms.  A worsening CAT score could be explained by an exacerbation, poor medication adherence, poor inhaler technique, or progression of COPD or comorbid conditions.  CAT MCID is 2 points  mMRC: mMRC (Modified Medical Research Council) Dyspnea Scale is used to assess the degree of baseline functional disability in patients of respiratory disease due to dyspnea. No minimal important difference is established. A decrease in score of 1 point or greater is considered a positive change.   Pulmonary Function Assessment:  Pulmonary Function Assessment - 09/12/22 1422       Post Bronchodilator Spirometry Results   FVC% 45 %    FEV1% 22 %    FEV1/FVC Ratio 45      Breath   Shortness of Breath Panic with Shortness of Breath;Yes;Fear of Shortness of Breath;Limiting activity             Exercise Target Goals: Exercise Program Goal: Individual exercise prescription set using results from initial 6 min walk test and THRR while considering  patient's activity barriers and safety.   Exercise Prescription Goal: Initial exercise prescription builds to 30-45 minutes a day of aerobic activity, 2-3 days per week.  Home exercise guidelines will be given to patient during program as part of exercise prescription that the participant will acknowledge.  Activity Barriers & Risk Stratification:  Activity Barriers & Cardiac Risk Stratification - 09/12/22 1303       Activity Barriers & Cardiac Risk Stratification   Activity Barriers Left Hip Replacement;Back Problems;Deconditioning;Shortness of Breath;Arthritis;Assistive Device    Cardiac Risk  Stratification Moderate             6 Minute Walk:  6 Minute Walk     Row Name 09/12/22 1413         6 Minute Walk   Phase  Initial     Distance 850 feet     Walk Time 6 minutes     # of Rest Breaks 0     MPH 1.61     METS 3.07     RPE 12     Perceived Dyspnea  13     VO2 Peak 10.76     Symptoms No     Resting HR 95 bpm     Resting BP 180/60     Resting Oxygen Saturation  97 %     Exercise Oxygen Saturation  during 6 min walk 90 %     Max Ex. HR 114 bpm     Max Ex. BP 186/54     2 Minute Post BP 168/50       Interval HR   1 Minute HR 102     2 Minute HR 107     3 Minute HR 110     4 Minute HR 112     5 Minute HR 114     6 Minute HR 114     2 Minute Post HR 107     Interval Heart Rate? Yes       Interval Oxygen   Interval Oxygen? Yes     Baseline Oxygen Saturation % 97 %     1 Minute Oxygen Saturation % 92 %     1 Minute Liters of Oxygen 2 L     2 Minute Oxygen Saturation % 92 %     2 Minute Liters of Oxygen 2 L     3 Minute Oxygen Saturation % 90 %     3 Minute Liters of Oxygen 2 L     4 Minute Oxygen Saturation % 91 %     4 Minute Liters of Oxygen 2 L     5 Minute Oxygen Saturation % 91 %     5 Minute Liters of Oxygen 2 L     6 Minute Oxygen Saturation % 91 %     6 Minute Liters of Oxygen 2 L     2 Minute Post Oxygen Saturation % 95 %     2 Minute Post Liters of Oxygen 2 L              Oxygen Initial Assessment:  Oxygen Initial Assessment - 09/12/22 1421       Home Oxygen   Home Oxygen Device Portable Concentrator;Home Concentrator    Sleep Oxygen Prescription Continuous    Liters per minute 2    Home Exercise Oxygen Prescription Continuous    Liters per minute 2    Home Resting Oxygen Prescription Continuous    Liters per minute 2    Compliance with Home Oxygen Use Yes             Oxygen Re-Evaluation:  Oxygen Re-Evaluation     Row Name 10/07/22 1443 11/04/22 1553           Program Oxygen Prescription   Program Oxygen  Prescription Continuous Continuous      Liters per minute 4 4      Comments Pt has been increasing oxygen to 4L when exercising Pt has been increasing oxygen to 4L when exercising        Home Oxygen   Home Oxygen Device Portable Concentrator;Home Concentrator Portable Concentrator;Home Concentrator      Sleep Oxygen Prescription Continuous Continuous      Liters per minute 2 2      Home Exercise  Oxygen Prescription Continuous Continuous      Liters per minute 2 2      Home Resting Oxygen Prescription Continuous Continuous      Liters per minute 2 2      Compliance with Home Oxygen Use Yes Yes        Goals/Expected Outcomes   Short Term Goals To learn and exhibit compliance with exercise, home and travel O2 prescription;To learn and understand importance of monitoring SPO2 with pulse oximeter and demonstrate accurate use of the pulse oximeter.;To learn and understand importance of maintaining oxygen saturations>88%;To learn and demonstrate proper pursed lip breathing techniques or other breathing techniques.  To learn and exhibit compliance with exercise, home and travel O2 prescription;To learn and understand importance of monitoring SPO2 with pulse oximeter and demonstrate accurate use of the pulse oximeter.;To learn and understand importance of maintaining oxygen saturations>88%;To learn and demonstrate proper pursed lip breathing techniques or other breathing techniques.       Ngu  Term Goals Exhibits compliance with exercise, home  and travel O2 prescription;Verbalizes importance of monitoring SPO2 with pulse oximeter and return demonstration;Maintenance of O2 saturations>88%;Exhibits proper breathing techniques, such as pursed lip breathing or other method taught during program session;Compliance with respiratory medication Exhibits compliance with exercise, home  and travel O2 prescription;Verbalizes importance of monitoring SPO2 with pulse oximeter and return demonstration;Maintenance of O2  saturations>88%;Exhibits proper breathing techniques, such as pursed lip breathing or other method taught during program session;Compliance with respiratory medication      Goals/Expected Outcomes Compliance Compliance               Oxygen Discharge (Final Oxygen Re-Evaluation):  Oxygen Re-Evaluation - 11/04/22 1553       Program Oxygen Prescription   Program Oxygen Prescription Continuous    Liters per minute 4    Comments Pt has been increasing oxygen to 4L when exercising      Home Oxygen   Home Oxygen Device Portable Concentrator;Home Concentrator    Sleep Oxygen Prescription Continuous    Liters per minute 2    Home Exercise Oxygen Prescription Continuous    Liters per minute 2    Home Resting Oxygen Prescription Continuous    Liters per minute 2    Compliance with Home Oxygen Use Yes      Goals/Expected Outcomes   Short Term Goals To learn and exhibit compliance with exercise, home and travel O2 prescription;To learn and understand importance of monitoring SPO2 with pulse oximeter and demonstrate accurate use of the pulse oximeter.;To learn and understand importance of maintaining oxygen saturations>88%;To learn and demonstrate proper pursed lip breathing techniques or other breathing techniques.     [place]  Term Goals Exhibits compliance with exercise, home  and travel O2 prescription;Verbalizes importance of monitoring SPO2 with pulse oximeter and return demonstration;Maintenance of O2 saturations>88%;Exhibits proper breathing techniques, such as pursed lip breathing or other method taught during program session;Compliance with respiratory medication    Goals/Expected Outcomes Compliance             Initial Exercise Prescription:  Initial Exercise Prescription - 09/12/22 1400       Date of Initial Exercise RX and Referring Provider   Date 09/12/22    Referring Provider Dr. Shearon Stalls    Expected Discharge Date 01/15/23      Oxygen   Oxygen Continuous    Liters 2     Maintain Oxygen Saturation 88% or higher      NuStep   Level 1  SPM 60    Minutes 17      Arm Ergometer   Level 1    RPM 50    Minutes 22      Prescription Details   Frequency (times per week) 2    Duration Progress to 30 minutes of continuous aerobic without signs/symptoms of physical distress      Intensity   THRR 40-80% of Max Heartrate 62-123    Ratings of Perceived Exertion 11-13    Perceived Dyspnea 0-4      Resistance Training   Training Prescription Yes    Weight 3    Reps 10-15             Perform Capillary Blood Glucose checks as needed.  Exercise Prescription Changes:   Exercise Prescription Changes     Row Name 09/23/22 1500 10/07/22 1400 10/09/22 1400 11/04/22 1500       Response to Exercise   Blood Pressure (Admit) 140/60 130/52 158/52 118/48    Blood Pressure (Exercise) 158/56 152/52 148/50 126/58    Blood Pressure (Exit) 140/60 132/54 150/56 126/52    Heart Rate (Admit) 98 bpm 90 bpm 96 bpm 94 bpm    Heart Rate (Exercise) 108 bpm 103 bpm 106 bpm 94 bpm    Heart Rate (Exit) 100 bpm 97 bpm 104 bpm 101 bpm    Oxygen Saturation (Admit) 94 % 92 % 90 % 95 %    Oxygen Saturation (Exercise) 95 % 90 % 92 % 96 %    Oxygen Saturation (Exit) 97 % 92 % 94 % 94 %    Rating of Perceived Exertion (Exercise) '12 13 12 12    '$ Perceived Dyspnea (Exercise) '13 13 12 12    '$ Duration Continue with 30 min of aerobic exercise without signs/symptoms of physical distress. Continue with 30 min of aerobic exercise without signs/symptoms of physical distress. Continue with 30 min of aerobic exercise without signs/symptoms of physical distress. Continue with 30 min of aerobic exercise without signs/symptoms of physical distress.    Intensity THRR unchanged THRR unchanged THRR unchanged THRR unchanged      Progression   Progression Continue to progress workloads to maintain intensity without signs/symptoms of physical distress. Continue to progress workloads to maintain  intensity without signs/symptoms of physical distress. Continue to progress workloads to maintain intensity without signs/symptoms of physical distress. Continue to progress workloads to maintain intensity without signs/symptoms of physical distress.      Resistance Training   Training Prescription Yes Yes Yes Yes    Weight '3 3 3 5    '$ Reps 10-15 10-15 10-15 10-15    Time 10 Minutes 10 Minutes 10 Minutes 10 Minutes      Oxygen   Oxygen Continuous Continuous Continuous Continuous    Liters '3 4 4 2      '$ NuStep   Level '1 1 1 2    '$ SPM 68 81 24 69    Minutes 39 22 39 39    METs 1.7 1.8 1.8 1.7      Arm Ergometer   Level -- 1 -- --    RPM -- 43 -- --    Minutes -- 17 -- --    METs -- 1.8 -- --             Exercise Comments:   Exercise Goals and Review:   Exercise Goals     Row Name 09/12/22 1417 10/07/22 1439 11/04/22 1547         Exercise  Goals   Increase Physical Activity Yes Yes Yes     Intervention Provide advice, education, support and counseling about physical activity/exercise needs.;Develop an individualized exercise prescription for aerobic and resistive training based on initial evaluation findings, risk stratification, comorbidities and participant's personal goals. Provide advice, education, support and counseling about physical activity/exercise needs.;Develop an individualized exercise prescription for aerobic and resistive training based on initial evaluation findings, risk stratification, comorbidities and participant's personal goals. Provide advice, education, support and counseling about physical activity/exercise needs.;Develop an individualized exercise prescription for aerobic and resistive training based on initial evaluation findings, risk stratification, comorbidities and participant's personal goals.     Expected Outcomes Short Term: Attend rehab on a regular basis to increase amount of physical activity.;[place] Term: Add in home exercise to make exercise  part of routine and to increase amount of physical activity.;[place] Term: Exercising regularly at least 3-5 days a week. Short Term: Attend rehab on a regular basis to increase amount of physical activity.;[place] Term: Add in home exercise to make exercise part of routine and to increase amount of physical activity.;[place] Term: Exercising regularly at least 3-5 days a week. Short Term: Attend rehab on a regular basis to increase amount of physical activity.;[place] Term: Add in home exercise to make exercise part of routine and to increase amount of physical activity.;[place] Term: Exercising regularly at least 3-5 days a week.     Increase Strength and Stamina Yes Yes Yes     Intervention Provide advice, education, support and counseling about physical activity/exercise needs.;Develop an individualized exercise prescription for aerobic and resistive training based on initial evaluation findings, risk stratification, comorbidities and participant's personal goals. Provide advice, education, support and counseling about physical activity/exercise needs.;Develop an individualized exercise prescription for aerobic and resistive training based on initial evaluation findings, risk stratification, comorbidities and participant's personal goals. Provide advice, education, support and counseling about physical activity/exercise needs.;Develop an individualized exercise prescription for aerobic and resistive training based on initial evaluation findings, risk stratification, comorbidities and participant's personal goals.     Expected Outcomes Short Term: Increase workloads from initial exercise prescription for resistance, speed, and METs.;Short Term: Perform resistance training exercises routinely during rehab and add in resistance training at home;[place] Term: Improve cardiorespiratory fitness, muscular endurance and strength as measured by increased METs and functional capacity (6MWT) Short Term: Increase workloads from initial  exercise prescription for resistance, speed, and METs.;Short Term: Perform resistance training exercises routinely during rehab and add in resistance training at home;[place] Term: Improve cardiorespiratory fitness, muscular endurance and strength as measured by increased METs and functional capacity (6MWT) Short Term: Increase workloads from initial exercise prescription for resistance, speed, and METs.;Short Term: Perform resistance training exercises routinely during rehab and add in resistance training at home;[place] Term: Improve cardiorespiratory fitness, muscular endurance and strength as measured by increased METs and functional capacity (6MWT)     Able to understand and use rate of perceived exertion (RPE) scale Yes Yes Yes     Intervention Provide education and explanation on how to use RPE scale Provide education and explanation on how to use RPE scale Provide education and explanation on how to use RPE scale     Expected Outcomes Short Term: Able to use RPE daily in rehab to express subjective intensity level;[place] Term:  Able to use RPE to guide intensity level when exercising independently -- Short Term: Able to use RPE daily in rehab to express subjective intensity level;[place] Term:  Able to use RPE to guide intensity level when  exercising independently     Able to understand and use Dyspnea scale Yes Yes Yes     Intervention Provide education and explanation on how to use Dyspnea scale Provide education and explanation on how to use Dyspnea scale Provide education and explanation on how to use Dyspnea scale     Expected Outcomes [place] Term: Able to use Dyspnea scale to guide intensity level when exercising independently;Short Term: Able to use Dyspnea scale daily in rehab to express subjective sense of shortness of breath during exertion [place] Term: Able to use Dyspnea scale to guide intensity level when exercising independently;Short Term: Able to use Dyspnea scale daily in rehab to express subjective  sense of shortness of breath during exertion [place] Term: Able to use Dyspnea scale to guide intensity level when exercising independently;Short Term: Able to use Dyspnea scale daily in rehab to express subjective sense of shortness of breath during exertion     Knowledge and understanding of Target Heart Rate Range (THRR) Yes Yes Yes     Intervention Provide education and explanation of THRR including how the numbers were predicted and where they are located for reference Provide education and explanation of THRR including how the numbers were predicted and where they are located for reference Provide education and explanation of THRR including how the numbers were predicted and where they are located for reference     Expected Outcomes Short Term: Able to state/look up THRR;[place] Term: Able to use THRR to govern intensity when exercising independently;Short Term: Able to use daily as guideline for intensity in rehab Short Term: Able to state/look up THRR;[place] Term: Able to use THRR to govern intensity when exercising independently;Short Term: Able to use daily as guideline for intensity in rehab Short Term: Able to state/look up THRR;[place] Term: Able to use THRR to govern intensity when exercising independently;Short Term: Able to use daily as guideline for intensity in rehab     Understanding of Exercise Prescription Yes Yes Yes     Intervention Provide education, explanation, and written materials on patient's individual exercise prescription Provide education, explanation, and written materials on patient's individual exercise prescription Provide education, explanation, and written materials on patient's individual exercise prescription     Expected Outcomes Short Term: Able to explain program exercise prescription;[place] Term: Able to explain home exercise prescription to exercise independently Short Term: Able to explain program exercise prescription;[place] Term: Able to explain home exercise prescription to  exercise independently Short Term: Able to explain program exercise prescription;[place] Term: Able to explain home exercise prescription to exercise independently              Exercise Goals Re-Evaluation :  Exercise Goals Re-Evaluation     Row Name 10/07/22 1439 11/04/22 1547           Exercise Goal Re-Evaluation   Exercise Goals Review Increase Strength and Stamina;Increase Physical Activity;Able to understand and use Dyspnea scale;Able to understand and use rate of perceived exertion (RPE) scale;Knowledge and understanding of Target Heart Rate Range (THRR);Understanding of Exercise Prescription Increase Physical Activity;Increase Strength and Stamina;Able to understand and use rate of perceived exertion (RPE) scale;Able to understand and use Dyspnea scale;Knowledge and understanding of Target Heart Rate Range (THRR);Understanding of Exercise Prescription      Comments Pt has completed 5 sessions of PR. He has wanted to use the treadmill but due to walking with a walker and him being a high fall risk staff have had to tell him that he is not able to. He  does seem to get discouraged during exercise due to his SOB and needing to take breaks to bring hos SpO2 up. He is currently exercising at 1.8 METs on the stepper. Will continue to monitor and progress as able. Pt has completed 9 sessions of PR. He has been able to use 2L of oxygen when exercising the past two sessions and not needing to go back up to 4L. He no longer uses the arm ergometer, he states that it hurts his shoulders. He continues to ask to use the treadmill and has to be reminded that he is a fall risk due to using a upright walker. He is currently exercising at 1.7 METs on the stepper. Will continue to monitor and progress as able.      Expected Outcomes Through exercise at home and at rehab, the patient will meet their stated goals. Through exercise at home and at rehab, the patient will meet their stated goals.                Discharge Exercise Prescription (Final Exercise Prescription Changes):  Exercise Prescription Changes - 11/04/22 1500       Response to Exercise   Blood Pressure (Admit) 118/48    Blood Pressure (Exercise) 126/58    Blood Pressure (Exit) 126/52    Heart Rate (Admit) 94 bpm    Heart Rate (Exercise) 94 bpm    Heart Rate (Exit) 101 bpm    Oxygen Saturation (Admit) 95 %    Oxygen Saturation (Exercise) 96 %    Oxygen Saturation (Exit) 94 %    Rating of Perceived Exertion (Exercise) 12    Perceived Dyspnea (Exercise) 12    Duration Continue with 30 min of aerobic exercise without signs/symptoms of physical distress.    Intensity THRR unchanged      Progression   Progression Continue to progress workloads to maintain intensity without signs/symptoms of physical distress.      Resistance Training   Training Prescription Yes    Weight 5    Reps 10-15    Time 10 Minutes      Oxygen   Oxygen Continuous    Liters 2      NuStep   Level 2    SPM 69    Minutes 39    METs 1.7             Nutrition:  Target Goals: Understanding of nutrition guidelines, daily intake of sodium '1500mg'$ , cholesterol '200mg'$ , calories 30% from fat and 7% or less from saturated fats, daily to have 5 or more servings of fruits and vegetables.  Biometrics:  Pre Biometrics - 09/12/22 1417       Pre Biometrics   Height '5\' 11"'$  (1.803 m)    Weight 80.4 kg    Waist Circumference 39 inches    Hip Circumference 39 inches    Waist to Hip Ratio 1 %    BMI (Calculated) 24.73    Triceps Skinfold 9 mm    % Body Fat 23.7 %    Grip Strength 40.7 kg    Flexibility 0 in    Single Leg Stand 0 seconds              Nutrition Therapy Plan and Nutrition Goals:  Nutrition Therapy & Goals - 09/12/22 1401       Personal Nutrition Goals   Comments Patient scored 10 on his diet assessment. His wife cooks and says she trys to cook healthy meals. Handout provided and explained to  he and his wife regarding  healthier choices. We offer 2 educational sessions on heart healthy nutrition with handouts and assistance with RD referral if patient is interested.      Intervention Plan   Intervention Nutrition handout(s) given to patient.    Expected Outcomes Short Term Goal: Understand basic principles of dietary content, such as calories, fat, sodium, cholesterol and nutrients.             Nutrition Assessments:  Nutrition Assessments - 09/12/22 1401       MEDFICTS Scores   Pre Score 10            MEDIFICTS Score Key: ?70 Need to make dietary changes  40-70 Heart Healthy Diet ? 40 Therapeutic Level Cholesterol Diet   Picture Your Plate Scores: <72 Unhealthy dietary pattern with much room for improvement. 41-50 Dietary pattern unlikely to meet recommendations for good health and room for improvement. 51-60 More healthful dietary pattern, with some room for improvement.  >60 Healthy dietary pattern, although there may be some specific behaviors that could be improved.    Nutrition Goals Re-Evaluation:   Nutrition Goals Discharge (Final Nutrition Goals Re-Evaluation):   Psychosocial: Target Goals: Acknowledge presence or absence of significant depression and/or stress, maximize coping skills, provide positive support system. Participant is able to verbalize types and ability to use techniques and skills needed for reducing stress and depression.  Initial Review & Psychosocial Screening:  Initial Psych Review & Screening - 09/12/22 1426       Initial Review   Current issues with Current Anxiety/Panic      Family Dynamics   Good Support System? Yes      Barriers   Psychosocial barriers to participate in program Psychosocial barriers identified (see note)      Screening Interventions   Interventions Encouraged to exercise;Provide feedback about the scores to participant    Expected Outcomes Short Term goal: Identification and review with participant of any Quality of Life  or Depression concerns found by scoring the questionnaire.             Quality of Life Scores:  Quality of Life - 09/12/22 1413       Quality of Life   Select Quality of Life      Quality of Life Scores   Health/Function Pre 6.72 %    Socioeconomic Pre 20.2 %    Psych/Spiritual Pre 7.79 %    Family Pre 27.1 %    GLOBAL Pre 12.08 %            Scores of 19 and below usually indicate a poorer quality of life in these areas.  A difference of  2-3 points is a clinically meaningful difference.  A difference of 2-3 points in the total score of the Quality of Life Index has been associated with significant improvement in overall quality of life, self-image, physical symptoms, and general health in studies assessing change in quality of life.   PHQ-9: Review Flowsheet  More data may exist      09/12/2022 11/12/2021 10/23/2020 12/14/2018 11/10/2018  Depression screen PHQ 2/9  Decreased Interest 0 0 0 3 3  Down, Depressed, Hopeless 0 0 0 3 3  PHQ - 2 Score 0 0 0 6 6  Altered sleeping 3 - - 3 3  Tired, decreased energy 3 - - 3 3  Change in appetite 3 - - 0 0  Feeling bad or failure about yourself  3 - - 2 2  Trouble  concentrating 0 - - 0 0  Moving slowly or fidgety/restless 0 - - 0 0  Suicidal thoughts 0 - - 0 0  PHQ-9 Score 12 - - 14 14  Difficult doing work/chores Extremely dIfficult - - - -   Interpretation of Total Score  Total Score Depression Severity:  1-4 = Minimal depression, 5-9 = Mild depression, 10-14 = Moderate depression, 15-19 = Moderately severe depression, 20-27 = Severe depression   Psychosocial Evaluation and Intervention:  Psychosocial Evaluation - 09/12/22 1427       Psychosocial Evaluation & Interventions   Interventions Encouraged to exercise with the program and follow exercise prescription;Stress management education;Relaxation education    Comments Patient has no psychosocial barriers identified at his orientaiton visit. His wife of 90 years was  with him and helped answer questions. He scored 13 on his PHQ-9 and low QOL socres mainly due to his chronic COPD that limits his ability to do activities. He says he used to be very active and loved to restore cars but now is not able to do hardly anything. He and his wife are from [place] and relocated 4 years ago to [place] to be closer to their daughter. They have 5 children, 2 are deceased and 2 are adopted. They have multiple grandchildren. 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 Patient has no diagnosis of anxiety but reports feeling very anxious and panicky when he gets SOB. His wife feels he needs to be treated for anxiety. I encouraged them to speak with their PCP about this and his wife said she would address with Dr. Shearon Stalls. He also has trouble staying asleep but is not taking anything as a sleep aide. Patient is not sure he can exercise for 34 minutes but he wants to try. He is totally sedentary now and wants to participate in the program to gain some endurance and be able to do more activities with less SOB. He is ready to start the program.    Expected Outcomes Patient will continue to have no psychosocial barriers identified.    Continue Psychosocial Services  No Follow up required             Psychosocial Re-Evaluation:  Psychosocial Re-Evaluation     Sidney Name 09/30/22 1331 10/24/22 1130           Psychosocial Re-Evaluation   Current issues with Current Anxiety/Panic Current Anxiety/Panic      Comments Patient is new to program completeing 3 sessions.  He was referred to pulmonary rehab by Dr. Shearon Stalls with COPD.  His initial QOL score is 12.08% and his PHQ-9 score is 13. He seems to enjoy coming to the program and demonstartes an interest in  improving his health and continues to demonstarte a positive out look and attitude.  We will continue to monitor as he progresses in the program. Patient has completed 7 sessions.  He has had several doctors appointment in last couple  weeks.  He seems to enjoy coming to the program and demonstartes an interest in  improving his health and continues to demonstarte a positive out look and attitude.  We will continue to monitor as he progresses in the program.      Expected Outcomes Patient will have no psychosocial issuses identified at discharge. Patient will have no psychosocial issuses identified at discharge.      Interventions Stress management education;Encouraged to attend Pulmonary Rehabilitation for the exercise;Relaxation education Stress management education;Encouraged to attend Pulmonary Rehabilitation for the  exercise;Relaxation education      Continue Psychosocial Services  No Follow up required No Follow up required               Psychosocial Discharge (Final Psychosocial Re-Evaluation):  Psychosocial Re-Evaluation - 10/24/22 1130       Psychosocial Re-Evaluation   Current issues with Current Anxiety/Panic    Comments Patient has completed 7 sessions.  He has had several doctors appointment in last couple weeks.  He seems to enjoy coming to the program and demonstartes an interest in  improving his health and continues to demonstarte a positive out look and attitude.  We will continue to monitor as he progresses in the program.    Expected Outcomes Patient will have no psychosocial issuses identified at discharge.    Interventions Stress management education;Encouraged to attend Pulmonary Rehabilitation for the exercise;Relaxation education    Continue Psychosocial Services  No Follow up required              Education: Education Goals: Education classes will be provided on a weekly basis, covering required topics. Participant will state understanding/return demonstration of topics presented.  Learning Barriers/Preferences:  Learning Barriers/Preferences - 09/12/22 1404       Learning Barriers/Preferences   Learning Barriers Hearing   Deaf in right ear.   Learning Preferences Written  Material;Audio;Pictoral             Education Topics: How Lungs Work and Diseases: - Discuss the anatomy of the lungs and diseases that can affect the lungs, such as COPD.   Exercise: -Discuss the importance of exercise, FITT principles of exercise, normal and abnormal responses to exercise, and how to exercise safely.   Environmental Irritants: -Discuss types of environmental irritants and how to limit exposure to environmental irritants.   Meds/Inhalers and oxygen: - Discuss respiratory medications, definition of an inhaler and oxygen, and the proper way to use an inhaler and oxygen.   Energy Saving Techniques: - Discuss methods to conserve energy and decrease shortness of breath when performing activities of daily living.    Bronchial Hygiene / Breathing Techniques: - Discuss breathing mechanics, pursed-lip breathing technique,  proper posture, effective ways to clear airways, and other functional breathing techniques   Cleaning Equipment: - Provides group verbal and written instruction about the health risks of elevated stress, cause of high stress, and healthy ways to reduce stress.   Nutrition I: Fats: - Discuss the types of cholesterol, what cholesterol does to the body, and how cholesterol levels can be controlled. Flowsheet Row PULMONARY REHAB CHRONIC OBSTRUCTIVE PULMONARY DISEASE from 10/30/2022 in [place]  Date 09/18/22  Educator handout  Instruction Review Code 1- Verbalizes Understanding       Nutrition II: Labels: -Discuss the different components of food labels and how to read food labels. Flowsheet Row PULMONARY REHAB CHRONIC OBSTRUCTIVE PULMONARY DISEASE from 10/30/2022 in [place]  Date 10/02/22  Educator HB  Instruction Review Code 1- Verbalizes Understanding       Respiratory Infections: - Discuss the signs and symptoms of respiratory infections, ways to prevent respiratory infections, and  the importance of seeking medical treatment when having a respiratory infection.   Stress I: Signs and Symptoms: - Discuss the causes of stress, how stress may lead to anxiety and depression, and ways to limit stress.   Stress II: Relaxation: -Discuss relaxation techniques to limit stress.   Oxygen for Home/Travel: - Discuss how to prepare for travel when on oxygen and proper  ways to transport and store oxygen to ensure safety. Flowsheet Row PULMONARY REHAB CHRONIC OBSTRUCTIVE PULMONARY DISEASE from 10/30/2022 in [place]  Date 10/30/22  Educator DF  Instruction Review Code 1- Verbalizes Understanding       Knowledge Questionnaire Score:  Knowledge Questionnaire Score - 09/12/22 1405       Knowledge Questionnaire Score   Pre Score 4/18             Core Components/Risk Factors/Patient Goals at Admission:  Personal Goals and Risk Factors at Admission - 09/12/22 1424       Core Components/Risk Factors/Patient Goals on Admission    Weight Management Weight Maintenance    Improve shortness of breath with ADL's Yes    Intervention Provide education, individualized exercise plan and daily activity instruction to help decrease symptoms of SOB with activities of daily living.    Expected Outcomes Short Term: Improve cardiorespiratory fitness to achieve a reduction of symptoms when performing ADLs;[place] Term: Be able to perform more ADLs without symptoms or delay the onset of symptoms    Hypertension Yes    Intervention Provide education on lifestyle modifcations including regular physical activity/exercise, weight management, moderate sodium restriction and increased consumption of fresh fruit, vegetables, and low fat dairy, alcohol moderation, and smoking cessation.;Monitor prescription use compliance.    Expected Outcomes Short Term: Continued assessment and intervention until BP is < 140/60m HG in hypertensive participants. < 130/817mHG in hypertensive  participants with diabetes, heart failure or chronic kidney disease.;[place] Term: Maintenance of blood pressure at goal levels.    Personal Goal Other Yes    Personal Goal Patient wants to be able to walk around more with less SOB and improve his endurance.    Intervention Patient will attend PR 2 days/week with exercise and education.    Expected Outcomes Patient will complete the program meeting both personal and program goals.             Core Components/Risk Factors/Patient Goals Review:   Goals and Risk Factor Review     Row Name 09/30/22 1427 10/24/22 1132           Core Components/Risk Factors/Patient Goals Review   Personal Goals Review Improve shortness of breath with ADL's;Develop more efficient breathing techniques such as purse lipped breathing and diaphragmatic breathing and practicing self-pacing with activity. Improve shortness of breath with ADL's;Develop more efficient breathing techniques such as purse lipped breathing and diaphragmatic breathing and practicing self-pacing with activity. (P)       Review Patient is new to the program, completeing sessions # 3.  He is referred to PR with COPD via Dr DeShearon Stalls Patient exercises on 3L of oxygen with O2 stats running between 94%-96% while exercising on the Nu [employer]. We will help him with develping a efficient breathing techniques such as purse lipped breathing and practice self pacing with increased activity.  Patient is doing well in program with progression and conisitent attendance.  We wiil encourage his progress as he continues to meet these goals.   His goals are to be able to walk with less SOB and  and improve with endurance. Patient is tolerating number 7 session well.  We will help him with develping a efficient breathing techniques such as purse lip breathing and practice self pacing with increased activity.  Patient is doing well in program with progression and conisitent attendance.  We will encourage his progress as he  continues to meet these goals, of walking with less  SOB and improving endurance.  Plan of care is ongoing and no further concerns as of present. (P)       Expected Outcomes Patient will complete the program meeting both program and personal goals at discharge. Patient will complete the program meeting both program and personal goals at discharge. (P)                Core Components/Risk Factors/Patient Goals at Discharge (Final Review):   Goals and Risk Factor Review - 10/24/22 1132       Core Components/Risk Factors/Patient Goals Review   Personal Goals Review Improve shortness of breath with ADL's;Develop more efficient breathing techniques such as purse lipped breathing and diaphragmatic breathing and practicing self-pacing with activity. (P)     Review Patient is tolerating number 7 session well.  We will help him with develping a efficient breathing techniques such as purse lip breathing and practice self pacing with increased activity.  Patient is doing well in program with progression and conisitent attendance.  We will encourage his progress as he continues to meet these goals, of walking with less SOB and improving endurance.  Plan of care is ongoing and no further concerns as of present. (P)     Expected Outcomes Patient will complete the program meeting both program and personal goals at discharge. (P)              ITP Comments:   Comments: ITP REVIEW Pt is making expected progress toward pulmonary rehab goals after completing 10 sessions. Recommend continued exercise, life style modification, education, and utilization of breathing techniques to increase stamina and strength and decrease shortness of breath with exertion.

## 2022-11-05 NOTE — Progress Notes (Signed)
Pulmonary Individual Treatment Plan  Patient Details  Name: Chase Lloyd MRN: [id number] Date of Birth: 09-20-1956 Referring Provider:   Flowsheet Row PULMONARY REHAB COPD ORIENTATION from 09/12/2022 in [place]  Referring Provider Dr. Shearon Stalls       Initial Encounter Date:  Flowsheet Row PULMONARY REHAB COPD ORIENTATION from 09/12/2022 in [place]  Date 09/12/22       Visit Diagnosis: Stage 4 very severe COPD by GOLD classification ([place])  Patient's Home Medications on Admission:   Current Outpatient Medications:    albuterol (PROVENTIL) (2.5 MG/3ML) 0.083% nebulizer solution, Take 3 mLs (2.5 mg total) by nebulization every 4 (four) hours as needed for wheezing or shortness of breath., Disp: 75 mL, Rfl: 12   albuterol (VENTOLIN HFA) 108 (90 Base) MCG/ACT inhaler, Inhale 2 puffs into the lungs every 6 (six) hours as needed for wheezing or shortness of breath., Disp: 8 g, Rfl: 0   aspirin 81 MG chewable tablet, Chew 81 mg by mouth daily., Disp: , Rfl:    Budeson-Glycopyrrol-Formoterol (BREZTRI AEROSPHERE) 160-9-4.8 MCG/ACT AERO, Inhale 2 puffs into the lungs in the morning and at bedtime., Disp: , Rfl:    furosemide (LASIX) 20 MG tablet, Take 20 mg by mouth daily., Disp: , Rfl:    ibuprofen (ADVIL) 800 MG tablet, TAKE 1 TABLET BY MOUTH EVERY 8 HOURS AS NEEDED, Disp: 270 tablet, Rfl: 0   metoprolol succinate (TOPROL-XL) 25 MG 24 hr tablet, Take 1 tablet (25 mg total) by mouth daily., Disp: 90 tablet, Rfl: 3   omeprazole (PRILOSEC) 40 MG capsule, Take 1 capsule by mouth twice daily (Patient taking differently: Take 40 mg by mouth daily before breakfast.), Disp: 180 capsule, Rfl: 0   rosuvastatin (CRESTOR) 10 MG tablet, Take 1 tablet (10 mg total) by mouth daily., Disp: 90 tablet, Rfl: 3   sodium chloride (OCEAN) 0.65 % SOLN nasal spray, Place 1 spray into both nostrils as needed for congestion., Disp: , Rfl:   Past Medical History: Past  Medical History:  Diagnosis Date   Arthritis    Back pain    COPD (chronic obstructive pulmonary disease) (HCC)    DDD (degenerative disc disease), lumbar    Depression    Emphysema of lung ([place])    Empyema ([place])    2016  (was in [place])   GERD (gastroesophageal reflux disease)    Hearing loss of right ear    started 2001.     microphone in right ear ,  and hearing aid in left   Hodgkin's disease ([place]) 1999   Hypotension    Meniere's disease    Pneumonia    Tinnitus     Tobacco Use: Social History   Tobacco Use  Smoking Status Former   Packs/day: 1.50   Years: 47.00   Total pack years: 70.50   Types: Cigarettes   Quit date: 06/13/2018   Years since quitting: 4.4  Smokeless Tobacco Never    Labs: Review Flowsheet       Latest Ref Rng & Units 05/05/2018 04/29/2019 07/01/2019 11/22/2021  Labs for ITP Cardiac and Pulmonary Rehab  Cholestrol 0 - 200 mg/dL 182  - 136  173   LDL (calc) 0 - 99 mg/dL 91  - 66  81   HDL-C >39.00 mg/dL 60.50  - 47  53.20   Trlycerides 0.0 - 149.0 mg/dL 153.0  - 116  196.0   PH, Arterial 7.350 - 7.450 - 7.338  - -  PCO2  arterial 32.0 - 48.0 mmHg - 54.3  - -  Bicarbonate 20.0 - 28.0 mmol/L - 29.2  30.0  31.0  - -  TCO2 22 - 32 mmol/L - 31  32  33  - -  O2 Saturation % - 100.0  73.0  72.0  - -    Capillary Blood Glucose: No results found for: "GLUCAP"   Pulmonary Assessment Scores:  Pulmonary Assessment Scores     Row Name 09/12/22 1422         ADL UCSD   ADL Phase Entry     SOB Score total 66     Rest 1     Walk 2     Stairs 4     Bath 3     Dress 2     Shop 1       CAT Score   CAT Score 30       mMRC Score   mMRC Score 3             UCSD: Self-administered rating of dyspnea associated with activities of daily living (ADLs) 6-point scale (0 = "not at all" to 5 = "maximal or unable to do because of breathlessness")  Scoring Scores range from 0 to 120.  Minimally important difference is 5 units  CAT: CAT can  identify the health impairment of COPD patients and is better correlated with disease progression.  CAT has a scoring range of zero to 40. The CAT score is classified into four groups of low (less than 10), medium (10 - 20), high (21-30) and very high (31-40) based on the impact level of disease on health status. A CAT score over 10 suggests significant symptoms.  A worsening CAT score could be explained by an exacerbation, poor medication adherence, poor inhaler technique, or progression of COPD or comorbid conditions.  CAT MCID is 2 points  mMRC: mMRC (Modified Medical Research Council) Dyspnea Scale is used to assess the degree of baseline functional disability in patients of respiratory disease due to dyspnea. No minimal important difference is established. A decrease in score of 1 point or greater is considered a positive change.   Pulmonary Function Assessment:  Pulmonary Function Assessment - 09/12/22 1422       Post Bronchodilator Spirometry Results   FVC% 45 %    FEV1% 22 %    FEV1/FVC Ratio 45      Breath   Shortness of Breath Panic with Shortness of Breath;Yes;Fear of Shortness of Breath;Limiting activity             Exercise Target Goals: Exercise Program Goal: Individual exercise prescription set using results from initial 6 min walk test and THRR while considering  patient's activity barriers and safety.   Exercise Prescription Goal: Initial exercise prescription builds to 30-45 minutes a day of aerobic activity, 2-3 days per week.  Home exercise guidelines will be given to patient during program as part of exercise prescription that the participant will acknowledge.  Activity Barriers & Risk Stratification:  Activity Barriers & Cardiac Risk Stratification - 09/12/22 1303       Activity Barriers & Cardiac Risk Stratification   Activity Barriers Left Hip Replacement;Back Problems;Deconditioning;Shortness of Breath;Arthritis;Assistive Device    Cardiac Risk  Stratification Moderate             6 Minute Walk:  6 Minute Walk     Row Name 09/12/22 1413         6 Minute Walk   Phase  Initial     Distance 850 feet     Walk Time 6 minutes     # of Rest Breaks 0     MPH 1.61     METS 3.07     RPE 12     Perceived Dyspnea  13     VO2 Peak 10.76     Symptoms No     Resting HR 95 bpm     Resting BP 180/60     Resting Oxygen Saturation  97 %     Exercise Oxygen Saturation  during 6 min walk 90 %     Max Ex. HR 114 bpm     Max Ex. BP 186/54     2 Minute Post BP 168/50       Interval HR   1 Minute HR 102     2 Minute HR 107     3 Minute HR 110     4 Minute HR 112     5 Minute HR 114     6 Minute HR 114     2 Minute Post HR 107     Interval Heart Rate? Yes       Interval Oxygen   Interval Oxygen? Yes     Baseline Oxygen Saturation % 97 %     1 Minute Oxygen Saturation % 92 %     1 Minute Liters of Oxygen 2 L     2 Minute Oxygen Saturation % 92 %     2 Minute Liters of Oxygen 2 L     3 Minute Oxygen Saturation % 90 %     3 Minute Liters of Oxygen 2 L     4 Minute Oxygen Saturation % 91 %     4 Minute Liters of Oxygen 2 L     5 Minute Oxygen Saturation % 91 %     5 Minute Liters of Oxygen 2 L     6 Minute Oxygen Saturation % 91 %     6 Minute Liters of Oxygen 2 L     2 Minute Post Oxygen Saturation % 95 %     2 Minute Post Liters of Oxygen 2 L              Oxygen Initial Assessment:  Oxygen Initial Assessment - 09/12/22 1421       Home Oxygen   Home Oxygen Device Portable Concentrator;Home Concentrator    Sleep Oxygen Prescription Continuous    Liters per minute 2    Home Exercise Oxygen Prescription Continuous    Liters per minute 2    Home Resting Oxygen Prescription Continuous    Liters per minute 2    Compliance with Home Oxygen Use Yes             Oxygen Re-Evaluation:  Oxygen Re-Evaluation     Row Name 10/07/22 1443 11/04/22 1553           Program Oxygen Prescription   Program Oxygen  Prescription Continuous Continuous      Liters per minute 4 4      Comments Pt has been increasing oxygen to 4L when exercising Pt has been increasing oxygen to 4L when exercising        Home Oxygen   Home Oxygen Device Portable Concentrator;Home Concentrator Portable Concentrator;Home Concentrator      Sleep Oxygen Prescription Continuous Continuous      Liters per minute 2 2      Home Exercise  Oxygen Prescription Continuous Continuous      Liters per minute 2 2      Home Resting Oxygen Prescription Continuous Continuous      Liters per minute 2 2      Compliance with Home Oxygen Use Yes Yes        Goals/Expected Outcomes   Short Term Goals To learn and exhibit compliance with exercise, home and travel O2 prescription;To learn and understand importance of monitoring SPO2 with pulse oximeter and demonstrate accurate use of the pulse oximeter.;To learn and understand importance of maintaining oxygen saturations>88%;To learn and demonstrate proper pursed lip breathing techniques or other breathing techniques.  To learn and exhibit compliance with exercise, home and travel O2 prescription;To learn and understand importance of monitoring SPO2 with pulse oximeter and demonstrate accurate use of the pulse oximeter.;To learn and understand importance of maintaining oxygen saturations>88%;To learn and demonstrate proper pursed lip breathing techniques or other breathing techniques.       [place]  Term Goals Exhibits compliance with exercise, home  and travel O2 prescription;Verbalizes importance of monitoring SPO2 with pulse oximeter and return demonstration;Maintenance of O2 saturations>88%;Exhibits proper breathing techniques, such as pursed lip breathing or other method taught during program session;Compliance with respiratory medication Exhibits compliance with exercise, home  and travel O2 prescription;Verbalizes importance of monitoring SPO2 with pulse oximeter and return demonstration;Maintenance of O2  saturations>88%;Exhibits proper breathing techniques, such as pursed lip breathing or other method taught during program session;Compliance with respiratory medication      Goals/Expected Outcomes Compliance Compliance               Oxygen Discharge (Final Oxygen Re-Evaluation):  Oxygen Re-Evaluation - 11/04/22 1553       Program Oxygen Prescription   Program Oxygen Prescription Continuous    Liters per minute 4    Comments Pt has been increasing oxygen to 4L when exercising      Home Oxygen   Home Oxygen Device Portable Concentrator;Home Concentrator    Sleep Oxygen Prescription Continuous    Liters per minute 2    Home Exercise Oxygen Prescription Continuous    Liters per minute 2    Home Resting Oxygen Prescription Continuous    Liters per minute 2    Compliance with Home Oxygen Use Yes      Goals/Expected Outcomes   Short Term Goals To learn and exhibit compliance with exercise, home and travel O2 prescription;To learn and understand importance of monitoring SPO2 with pulse oximeter and demonstrate accurate use of the pulse oximeter.;To learn and understand importance of maintaining oxygen saturations>88%;To learn and demonstrate proper pursed lip breathing techniques or other breathing techniques.     [place]  Term Goals Exhibits compliance with exercise, home  and travel O2 prescription;Verbalizes importance of monitoring SPO2 with pulse oximeter and return demonstration;Maintenance of O2 saturations>88%;Exhibits proper breathing techniques, such as pursed lip breathing or other method taught during program session;Compliance with respiratory medication    Goals/Expected Outcomes Compliance             Initial Exercise Prescription:  Initial Exercise Prescription - 09/12/22 1400       Date of Initial Exercise RX and Referring Provider   Date 09/12/22    Referring Provider Dr. Shearon Stalls    Expected Discharge Date 01/15/23      Oxygen   Oxygen Continuous    Liters 2     Maintain Oxygen Saturation 88% or higher      NuStep   Level 1  SPM 60    Minutes 17      Arm Ergometer   Level 1    RPM 50    Minutes 22      Prescription Details   Frequency (times per week) 2    Duration Progress to 30 minutes of continuous aerobic without signs/symptoms of physical distress      Intensity   THRR 40-80% of Max Heartrate 62-123    Ratings of Perceived Exertion 11-13    Perceived Dyspnea 0-4      Resistance Training   Training Prescription Yes    Weight 3    Reps 10-15             Perform Capillary Blood Glucose checks as needed.  Exercise Prescription Changes:   Exercise Prescription Changes     Row Name 09/23/22 1500 10/07/22 1400 10/09/22 1400 11/04/22 1500       Response to Exercise   Blood Pressure (Admit) 140/60 130/52 158/52 118/48    Blood Pressure (Exercise) 158/56 152/52 148/50 126/58    Blood Pressure (Exit) 140/60 132/54 150/56 126/52    Heart Rate (Admit) 98 bpm 90 bpm 96 bpm 94 bpm    Heart Rate (Exercise) 108 bpm 103 bpm 106 bpm 94 bpm    Heart Rate (Exit) 100 bpm 97 bpm 104 bpm 101 bpm    Oxygen Saturation (Admit) 94 % 92 % 90 % 95 %    Oxygen Saturation (Exercise) 95 % 90 % 92 % 96 %    Oxygen Saturation (Exit) 97 % 92 % 94 % 94 %    Rating of Perceived Exertion (Exercise) '12 13 12 12    '$ Perceived Dyspnea (Exercise) '13 13 12 12    '$ Duration Continue with 30 min of aerobic exercise without signs/symptoms of physical distress. Continue with 30 min of aerobic exercise without signs/symptoms of physical distress. Continue with 30 min of aerobic exercise without signs/symptoms of physical distress. Continue with 30 min of aerobic exercise without signs/symptoms of physical distress.    Intensity THRR unchanged THRR unchanged THRR unchanged THRR unchanged      Progression   Progression Continue to progress workloads to maintain intensity without signs/symptoms of physical distress. Continue to progress workloads to maintain  intensity without signs/symptoms of physical distress. Continue to progress workloads to maintain intensity without signs/symptoms of physical distress. Continue to progress workloads to maintain intensity without signs/symptoms of physical distress.      Resistance Training   Training Prescription Yes Yes Yes Yes    Weight '3 3 3 5    '$ Reps 10-15 10-15 10-15 10-15    Time 10 Minutes 10 Minutes 10 Minutes 10 Minutes      Oxygen   Oxygen Continuous Continuous Continuous Continuous    Liters '3 4 4 2      '$ NuStep   Level '1 1 1 2    '$ SPM 68 81 24 69    Minutes 39 22 39 39    METs 1.7 1.8 1.8 1.7      Arm Ergometer   Level -- 1 -- --    RPM -- 43 -- --    Minutes -- 17 -- --    METs -- 1.8 -- --             Exercise Comments:   Exercise Goals and Review:   Exercise Goals     Row Name 09/12/22 1417 10/07/22 1439 11/04/22 1547         Exercise  Goals   Increase Physical Activity Yes Yes Yes     Intervention Provide advice, education, support and counseling about physical activity/exercise needs.;Develop an individualized exercise prescription for aerobic and resistive training based on initial evaluation findings, risk stratification, comorbidities and participant's personal goals. Provide advice, education, support and counseling about physical activity/exercise needs.;Develop an individualized exercise prescription for aerobic and resistive training based on initial evaluation findings, risk stratification, comorbidities and participant's personal goals. Provide advice, education, support and counseling about physical activity/exercise needs.;Develop an individualized exercise prescription for aerobic and resistive training based on initial evaluation findings, risk stratification, comorbidities and participant's personal goals.     Expected Outcomes Short Term: Attend rehab on a regular basis to increase amount of physical activity.;[place] Term: Add in home exercise to make exercise  part of routine and to increase amount of physical activity.;[place] Term: Exercising regularly at least 3-5 days a week. Short Term: Attend rehab on a regular basis to increase amount of physical activity.;[place] Term: Add in home exercise to make exercise part of routine and to increase amount of physical activity.;[place] Term: Exercising regularly at least 3-5 days a week. Short Term: Attend rehab on a regular basis to increase amount of physical activity.;[place] Term: Add in home exercise to make exercise part of routine and to increase amount of physical activity.;[place] Term: Exercising regularly at least 3-5 days a week.     Increase Strength and Stamina Yes Yes Yes     Intervention Provide advice, education, support and counseling about physical activity/exercise needs.;Develop an individualized exercise prescription for aerobic and resistive training based on initial evaluation findings, risk stratification, comorbidities and participant's personal goals. Provide advice, education, support and counseling about physical activity/exercise needs.;Develop an individualized exercise prescription for aerobic and resistive training based on initial evaluation findings, risk stratification, comorbidities and participant's personal goals. Provide advice, education, support and counseling about physical activity/exercise needs.;Develop an individualized exercise prescription for aerobic and resistive training based on initial evaluation findings, risk stratification, comorbidities and participant's personal goals.     Expected Outcomes Short Term: Increase workloads from initial exercise prescription for resistance, speed, and METs.;Short Term: Perform resistance training exercises routinely during rehab and add in resistance training at home;[place] Term: Improve cardiorespiratory fitness, muscular endurance and strength as measured by increased METs and functional capacity (6MWT) Short Term: Increase workloads from initial  exercise prescription for resistance, speed, and METs.;Short Term: Perform resistance training exercises routinely during rehab and add in resistance training at home;[place] Term: Improve cardiorespiratory fitness, muscular endurance and strength as measured by increased METs and functional capacity (6MWT) Short Term: Increase workloads from initial exercise prescription for resistance, speed, and METs.;Short Term: Perform resistance training exercises routinely during rehab and add in resistance training at home;[place] Term: Improve cardiorespiratory fitness, muscular endurance and strength as measured by increased METs and functional capacity (6MWT)     Able to understand and use rate of perceived exertion (RPE) scale Yes Yes Yes     Intervention Provide education and explanation on how to use RPE scale Provide education and explanation on how to use RPE scale Provide education and explanation on how to use RPE scale     Expected Outcomes Short Term: Able to use RPE daily in rehab to express subjective intensity level;[place] Term:  Able to use RPE to guide intensity level when exercising independently -- Short Term: Able to use RPE daily in rehab to express subjective intensity level;[place] Term:  Able to use RPE to guide intensity level when  exercising independently     Able to understand and use Dyspnea scale Yes Yes Yes     Intervention Provide education and explanation on how to use Dyspnea scale Provide education and explanation on how to use Dyspnea scale Provide education and explanation on how to use Dyspnea scale     Expected Outcomes [place] Term: Able to use Dyspnea scale to guide intensity level when exercising independently;Short Term: Able to use Dyspnea scale daily in rehab to express subjective sense of shortness of breath during exertion [place] Term: Able to use Dyspnea scale to guide intensity level when exercising independently;Short Term: Able to use Dyspnea scale daily in rehab to express subjective  sense of shortness of breath during exertion [place] Term: Able to use Dyspnea scale to guide intensity level when exercising independently;Short Term: Able to use Dyspnea scale daily in rehab to express subjective sense of shortness of breath during exertion     Knowledge and understanding of Target Heart Rate Range (THRR) Yes Yes Yes     Intervention Provide education and explanation of THRR including how the numbers were predicted and where they are located for reference Provide education and explanation of THRR including how the numbers were predicted and where they are located for reference Provide education and explanation of THRR including how the numbers were predicted and where they are located for reference     Expected Outcomes Short Term: Able to state/look up THRR;[place]iz Term: Able to use THRR to govern intensity when exercising independently;Short Term: Able to use daily as guideline for intensity in rehab Short Term: Able to state/look up THRR;[place] Term: Able to use THRR to govern intensity when exercising independently;Short Term: Able to use daily as guideline for intensity in rehab Short Term: Able to state/look up THRR;[place] Term: Able to use THRR to govern intensity when exercising independently;Short Term: Able to use daily as guideline for intensity in rehab     Understanding of Exercise Prescription Yes Yes Yes     Intervention Provide education, explanation, and written materials on patient's individual exercise prescription Provide education, explanation, and written materials on patient's individual exercise prescription Provide education, explanation, and written materials on patient's individual exercise prescription     Expected Outcomes Short Term: Able to explain program exercise prescription;[place] Term: Able to explain home exercise prescription to exercise independently Short Term: Able to explain program exercise prescription;[place] Term: Able to explain home exercise prescription to  exercise independently Short Term: Able to explain program exercise prescription;[place] Term: Able to explain home exercise prescription to exercise independently              Exercise Goals Re-Evaluation :  Exercise Goals Re-Evaluation     Row Name 10/07/22 1439 11/04/22 1547           Exercise Goal Re-Evaluation   Exercise Goals Review Increase Strength and Stamina;Increase Physical Activity;Able to understand and use Dyspnea scale;Able to understand and use rate of perceived exertion (RPE) scale;Knowledge and understanding of Target Heart Rate Range (THRR);Understanding of Exercise Prescription Increase Physical Activity;Increase Strength and Stamina;Able to understand and use rate of perceived exertion (RPE) scale;Able to understand and use Dyspnea scale;Knowledge and understanding of Target Heart Rate Range (THRR);Understanding of Exercise Prescription      Comments Pt has completed 5 sessions of PR. He has wanted to use the treadmill but due to walking with a walker and him being a high fall risk staff have had to tell him that he is not able to. He  does seem to get discouraged during exercise due to his SOB and needing to take breaks to bring hos SpO2 up. He is currently exercising at 1.8 METs on the stepper. Will continue to monitor and progress as able. Pt has completed 9 sessions of PR. He has been able to use 2L of oxygen when exercising the past two sessions and not needing to go back up to 4L. He no longer uses the arm ergometer, he states that it hurts his shoulders. He continues to ask to use the treadmill and has to be reminded that he is a fall risk due to using a upright walker. He is currently exercising at 1.7 METs on the stepper. Will continue to monitor and progress as able.      Expected Outcomes Through exercise at home and at rehab, the patient will meet their stated goals. Through exercise at home and at rehab, the patient will meet their stated goals.                Discharge Exercise Prescription (Final Exercise Prescription Changes):  Exercise Prescription Changes - 11/04/22 1500       Response to Exercise   Blood Pressure (Admit) 118/48    Blood Pressure (Exercise) 126/58    Blood Pressure (Exit) 126/52    Heart Rate (Admit) 94 bpm    Heart Rate (Exercise) 94 bpm    Heart Rate (Exit) 101 bpm    Oxygen Saturation (Admit) 95 %    Oxygen Saturation (Exercise) 96 %    Oxygen Saturation (Exit) 94 %    Rating of Perceived Exertion (Exercise) 12    Perceived Dyspnea (Exercise) 12    Duration Continue with 30 min of aerobic exercise without signs/symptoms of physical distress.    Intensity THRR unchanged      Progression   Progression Continue to progress workloads to maintain intensity without signs/symptoms of physical distress.      Resistance Training   Training Prescription Yes    Weight 5    Reps 10-15    Time 10 Minutes      Oxygen   Oxygen Continuous    Liters 2      NuStep   Level 2    SPM 69    Minutes 39    METs 1.7             Nutrition:  Target Goals: Understanding of nutrition guidelines, daily intake of sodium '1500mg'$ , cholesterol '200mg'$ , calories 30% from fat and 7% or less from saturated fats, daily to have 5 or more servings of fruits and vegetables.  Biometrics:  Pre Biometrics - 09/12/22 1417       Pre Biometrics   Height '5\' 11"'$  (1.803 m)    Weight 80.4 kg    Waist Circumference 39 inches    Hip Circumference 39 inches    Waist to Hip Ratio 1 %    BMI (Calculated) 24.73    Triceps Skinfold 9 mm    % Body Fat 23.7 %    Grip Strength 40.7 kg    Flexibility 0 in    Single Leg Stand 0 seconds              Nutrition Therapy Plan and Nutrition Goals:  Nutrition Therapy & Goals - 09/12/22 1401       Personal Nutrition Goals   Comments Patient scored 10 on his diet assessment. His wife cooks and says she trys to cook healthy meals. Handout provided and explained to  he and his wife regarding  healthier choices. We offer 2 educational sessions on heart healthy nutrition with handouts and assistance with RD referral if patient is interested.      Intervention Plan   Intervention Nutrition handout(s) given to patient.    Expected Outcomes Short Term Goal: Understand basic principles of dietary content, such as calories, fat, sodium, cholesterol and nutrients.             Nutrition Assessments:  Nutrition Assessments - 09/12/22 1401       MEDFICTS Scores   Pre Score 10            MEDIFICTS Score Key: ?70 Need to make dietary changes  40-70 Heart Healthy Diet ? 40 Therapeutic Level Cholesterol Diet   Picture Your Plate Scores: <54 Unhealthy dietary pattern with much room for improvement. 41-50 Dietary pattern unlikely to meet recommendations for good health and room for improvement. 51-60 More healthful dietary pattern, with some room for improvement.  >60 Healthy dietary pattern, although there may be some specific behaviors that could be improved.    Nutrition Goals Re-Evaluation:   Nutrition Goals Discharge (Final Nutrition Goals Re-Evaluation):   Psychosocial: Target Goals: Acknowledge presence or absence of significant depression and/or stress, maximize coping skills, provide positive support system. Participant is able to verbalize types and ability to use techniques and skills needed for reducing stress and depression.  Initial Review & Psychosocial Screening:  Initial Psych Review & Screening - 09/12/22 1426       Initial Review   Current issues with Current Anxiety/Panic      Family Dynamics   Good Support System? Yes      Barriers   Psychosocial barriers to participate in program Psychosocial barriers identified (see note)      Screening Interventions   Interventions Encouraged to exercise;Provide feedback about the scores to participant    Expected Outcomes Short Term goal: Identification and review with participant of any Quality of Life  or Depression concerns found by scoring the questionnaire.             Quality of Life Scores:  Quality of Life - 09/12/22 1413       Quality of Life   Select Quality of Life      Quality of Life Scores   Health/Function Pre 6.72 %    Socioeconomic Pre 20.2 %    Psych/Spiritual Pre 7.79 %    Family Pre 27.1 %    GLOBAL Pre 12.08 %            Scores of 19 and below usually indicate a poorer quality of life in these areas.  A difference of  2-3 points is a clinically meaningful difference.  A difference of 2-3 points in the total score of the Quality of Life Index has been associated with significant improvement in overall quality of life, self-image, physical symptoms, and general health in studies assessing change in quality of life.   PHQ-9: Review Flowsheet  More data may exist      09/12/2022 11/12/2021 10/23/2020 12/14/2018 11/10/2018  Depression screen PHQ 2/9  Decreased Interest 0 0 0 3 3  Down, Depressed, Hopeless 0 0 0 3 3  PHQ - 2 Score 0 0 0 6 6  Altered sleeping 3 - - 3 3  Tired, decreased energy 3 - - 3 3  Change in appetite 3 - - 0 0  Feeling bad or failure about yourself  3 - - 2 2  Trouble  concentrating 0 - - 0 0  Moving slowly or fidgety/restless 0 - - 0 0  Suicidal thoughts 0 - - 0 0  PHQ-9 Score 12 - - 14 14  Difficult doing work/chores Extremely dIfficult - - - -   Interpretation of Total Score  Total Score Depression Severity:  1-4 = Minimal depression, 5-9 = Mild depression, 10-14 = Moderate depression, 15-19 = Moderately severe depression, 20-27 = Severe depression   Psychosocial Evaluation and Intervention:  Psychosocial Evaluation - 09/12/22 1427       Psychosocial Evaluation & Interventions   Interventions Encouraged to exercise with the program and follow exercise prescription;Stress management education;Relaxation education    Comments Patient has no psychosocial barriers identified at his orientaiton visit. His wife of 67 years was  with him and helped answer questions. He scored 13 on his PHQ-9 and low QOL socres mainly due to his chronic COPD that limits his ability to do activities. He says he used to be very active and loved to restore cars but now is not able to do hardly anything. He and his wife are from [place] and relocated 4 years ago to [place] to be closer to their daughter. They have 5 children, 2 are deceased and 2 are adopted. They have multiple grandchildren. 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 Patient has no diagnosis of anxiety but reports feeling very anxious and panicky when he gets SOB. His wife feels he needs to be treated for anxiety. I encouraged them to speak with their PCP about this and his wife said she would address with Dr. Shearon Stalls. He also has trouble staying asleep but is not taking anything as a sleep aide. Patient is not sure he can exercise for 34 minutes but he wants to try. He is totally sedentary now and wants to participate in the program to gain some endurance and be able to do more activities with less SOB. He is ready to start the program.    Expected Outcomes Patient will continue to have no psychosocial barriers identified.    Continue Psychosocial Services  No Follow up required             Psychosocial Re-Evaluation:  Psychosocial Re-Evaluation     [place] Name 09/30/22 1331 10/24/22 1130           Psychosocial Re-Evaluation   Current issues with Current Anxiety/Panic Current Anxiety/Panic      Comments Patient is new to program completeing 3 sessions.  He was referred to pulmonary rehab by Dr. Shearon Stalls with COPD.  His initial QOL score is 12.08% and his PHQ-9 score is 13. He seems to enjoy coming to the program and demonstartes an interest in  improving his health and continues to demonstarte a positive out look and attitude.  We will continue to monitor as he progresses in the program. Patient has completed 7 sessions.  He has had several doctors appointment in last couple  weeks.  He seems to enjoy coming to the program and demonstartes an interest in  improving his health and continues to demonstarte a positive out look and attitude.  We will continue to monitor as he progresses in the program.      Expected Outcomes Patient will have no psychosocial issuses identified at discharge. Patient will have no psychosocial issuses identified at discharge.      Interventions Stress management education;Encouraged to attend Pulmonary Rehabilitation for the exercise;Relaxation education Stress management education;Encouraged to attend Pulmonary Rehabilitation for the  exercise;Relaxation education      Continue Psychosocial Services  No Follow up required No Follow up required               Psychosocial Discharge (Final Psychosocial Re-Evaluation):  Psychosocial Re-Evaluation - 10/24/22 1130       Psychosocial Re-Evaluation   Current issues with Current Anxiety/Panic    Comments Patient has completed 7 sessions.  He has had several doctors appointment in last couple weeks.  He seems to enjoy coming to the program and demonstartes an interest in  improving his health and continues to demonstarte a positive out look and attitude.  We will continue to monitor as he progresses in the program.    Expected Outcomes Patient will have no psychosocial issuses identified at discharge.    Interventions Stress management education;Encouraged to attend Pulmonary Rehabilitation for the exercise;Relaxation education    Continue Psychosocial Services  No Follow up required              Education: Education Goals: Education classes will be provided on a weekly basis, covering required topics. Participant will state understanding/return demonstration of topics presented.  Learning Barriers/Preferences:  Learning Barriers/Preferences - 09/12/22 1404       Learning Barriers/Preferences   Learning Barriers Hearing   Deaf in right ear.   Learning Preferences Written  Material;Audio;Pictoral             Education Topics: How Lungs Work and Diseases: - Discuss the anatomy of the lungs and diseases that can affect the lungs, such as COPD.   Exercise: -Discuss the importance of exercise, FITT principles of exercise, normal and abnormal responses to exercise, and how to exercise safely.   Environmental Irritants: -Discuss types of environmental irritants and how to limit exposure to environmental irritants.   Meds/Inhalers and oxygen: - Discuss respiratory medications, definition of an inhaler and oxygen, and the proper way to use an inhaler and oxygen.   Energy Saving Techniques: - Discuss methods to conserve energy and decrease shortness of breath when performing activities of daily living.    Bronchial Hygiene / Breathing Techniques: - Discuss breathing mechanics, pursed-lip breathing technique,  proper posture, effective ways to clear airways, and other functional breathing techniques   Cleaning Equipment: - Provides group verbal and written instruction about the health risks of elevated stress, cause of high stress, and healthy ways to reduce stress.   Nutrition I: Fats: - Discuss the types of cholesterol, what cholesterol does to the body, and how cholesterol levels can be controlled. Flowsheet Row PULMONARY REHAB CHRONIC OBSTRUCTIVE PULMONARY DISEASE from 10/30/2022 in [place]  Date 09/18/22  Educator handout  Instruction Review Code 1- Verbalizes Understanding       Nutrition II: Labels: -Discuss the different components of food labels and how to read food labels. Flowsheet Row PULMONARY REHAB CHRONIC OBSTRUCTIVE PULMONARY DISEASE from 10/30/2022 in [place]  Date 10/02/22  Educator HB  Instruction Review Code 1- Verbalizes Understanding       Respiratory Infections: - Discuss the signs and symptoms of respiratory infections, ways to prevent respiratory infections, and  the importance of seeking medical treatment when having a respiratory infection.   Stress I: Signs and Symptoms: - Discuss the causes of stress, how stress may lead to anxiety and depression, and ways to limit stress.   Stress II: Relaxation: -Discuss relaxation techniques to limit stress.   Oxygen for Home/Travel: - Discuss how to prepare for travel when on oxygen and proper  ways to transport and store oxygen to ensure safety. Flowsheet Row PULMONARY REHAB CHRONIC OBSTRUCTIVE PULMONARY DISEASE from 10/30/2022 in [place]  Date 10/30/22  Educator DF  Instruction Review Code 1- Verbalizes Understanding       Knowledge Questionnaire Score:  Knowledge Questionnaire Score - 09/12/22 1405       Knowledge Questionnaire Score   Pre Score 4/18             Core Components/Risk Factors/Patient Goals at Admission:  Personal Goals and Risk Factors at Admission - 09/12/22 1424       Core Components/Risk Factors/Patient Goals on Admission    Weight Management Weight Maintenance    Improve shortness of breath with ADL's Yes    Intervention Provide education, individualized exercise plan and daily activity instruction to help decrease symptoms of SOB with activities of daily living.    Expected Outcomes Short Term: Improve cardiorespiratory fitness to achieve a reduction of symptoms when performing ADLs;[place] Term: Be able to perform more ADLs without symptoms or delay the onset of symptoms    Hypertension Yes    Intervention Provide education on lifestyle modifcations including regular physical activity/exercise, weight management, moderate sodium restriction and increased consumption of fresh fruit, vegetables, and low fat dairy, alcohol moderation, and smoking cessation.;Monitor prescription use compliance.    Expected Outcomes Short Term: Continued assessment and intervention until BP is < 140/66m HG in hypertensive participants. < 130/843mHG in hypertensive  participants with diabetes, heart failure or chronic kidney disease.;[place] Term: Maintenance of blood pressure at goal levels.    Personal Goal Other Yes    Personal Goal Patient wants to be able to walk around more with less SOB and improve his endurance.    Intervention Patient will attend PR 2 days/week with exercise and education.    Expected Outcomes Patient will complete the program meeting both personal and program goals.             Core Components/Risk Factors/Patient Goals Review:   Goals and Risk Factor Review     Row Name 09/30/22 1427 10/24/22 1132           Core Components/Risk Factors/Patient Goals Review   Personal Goals Review Improve shortness of breath with ADL's;Develop more efficient breathing techniques such as purse lipped breathing and diaphragmatic breathing and practicing self-pacing with activity. Improve shortness of breath with ADL's;Develop more efficient breathing techniques such as purse lipped breathing and diaphragmatic breathing and practicing self-pacing with activity. (P)       Review Patient is new to the program, completeing sessions # 3.  He is referred to PR with COPD via Dr DeShearon Stalls Patient exercises on 3L of oxygen with O2 stats running between 94%-96% while exercising on the Nu [place]. We will help him with develping a efficient breathing techniques such as purse lipped breathing and practice self pacing with increased activity.  Patient is doing well in program with progression and conisitent attendance.  We wiil encourage his progress as he continues to meet these goals.   His goals are to be able to walk with less SOB and  and improve with endurance. Patient is tolerating number 7 session well.  We will help him with develping a efficient breathing techniques such as purse lip breathing and practice self pacing with increased activity.  Patient is doing well in program with progression and conisitent attendance.  We will encourage his progress as he  continues to meet these goals, of walking with less  SOB and improving endurance.  Plan of care is ongoing and no further concerns as of present. (P)       Expected Outcomes Patient will complete the program meeting both program and personal goals at discharge. Patient will complete the program meeting both program and personal goals at discharge. (P)                Core Components/Risk Factors/Patient Goals at Discharge (Final Review):   Goals and Risk Factor Review - 10/24/22 1132       Core Components/Risk Factors/Patient Goals Review   Personal Goals Review Improve shortness of breath with ADL's;Develop more efficient breathing techniques such as purse lipped breathing and diaphragmatic breathing and practicing self-pacing with activity. (P)     Review Patient is tolerating number 7 session well.  We will help him with develping a efficient breathing techniques such as purse lip breathing and practice self pacing with increased activity.  Patient is doing well in program with progression and conisitent attendance.  We will encourage his progress as he continues to meet these goals, of walking with less SOB and improving endurance.  Plan of care is ongoing and no further concerns as of present. (P)     Expected Outcomes Patient will complete the program meeting both program and personal goals at discharge. (P)              ITP Comments:   Comments: ITP REVIEW Pt is making expected progress toward pulmonary rehab goals after completing 10 sessions. Recommend continued exercise, life style modification, education, and utilization of breathing techniques to increase stamina and strength and decrease shortness of breath with exertion.

## 2022-11-06 ENCOUNTER — Encounter (HOSPITAL_COMMUNITY)
Admission: RE | Admit: 2022-11-06 | Discharge: 2022-11-06 | Disposition: A | Discharge status: Home / Self Care | Payer: Medicare Other | Source: Ambulatory Visit | Attending: Internal Medicine | Admitting: Internal Medicine

## 2022-11-06 DIAGNOSIS — J449 Chronic obstructive pulmonary disease, unspecified: Secondary | ICD-10-CM

## 2022-11-06 NOTE — Progress Notes (Signed)
Daily Session Note  Patient Details  Name: Chase Lloyd MRN: [id number] Date of Birth: [birth year] Referring Provider:   Flowsheet Row PULMONARY REHAB COPD ORIENTATION from 09/12/2022 in [place]  Referring Provider Dr. Shearon Stalls       Encounter Date: 11/06/2022  Check In:  Session Check In - 11/06/22 1330       Check-In   Supervising physician immediately available to respond to emergencies [facility] MD immediately available    Physician(s) Dr Domenic Polite    Location [facility]    Staff Present Hoy Register MHA, MS, ACSM-CEP;Melven Sartorius BSN, RN    Virtual Visit No    Medication changes reported     No    Fall or balance concerns reported    Yes    Comments Patient uses a standing walker for ambulation.    Tobacco Cessation No Change    Warm-up and Cool-down Performed as group-led instruction    Resistance Training Performed Yes    VAD Patient? No    PAD/SET Patient? No      Pain Assessment   Currently in Pain? Yes    Pain Score 4     Pain Location Back    Pain Orientation Lower    Pain Descriptors / Indicators Aching    Pain Type Chronic pain    Pain Onset Other (comment)   chronic back pain   Pain Frequency Constant    Multiple Pain Sites No             Capillary Blood Glucose: No results found for this or any previous visit (from the past 24 hour(s)).    Social History   Tobacco Use  Smoking Status Former   Packs/day: 1.50   Years: 47.00   Total pack years: 70.50   Types: Cigarettes   Quit date: 06/13/2018   Years since quitting: 4.4  Smokeless Tobacco Never    Goals Met:  Proper associated with RPD/PD & O2 Sat Independence with exercise equipment Using PLB without cueing & demonstrates good technique Exercise tolerated well Queuing for purse lip breathing No report of concerns or symptoms today Strength training completed today  Goals Unmet:  Not Applicable  Comments: check out at 2:30   Dr.  Kathie Dike is Medical Director for [facility].

## 2022-11-11 ENCOUNTER — Encounter (HOSPITAL_COMMUNITY)
Admission: RE | Admit: 2022-11-11 | Discharge: 2022-11-11 | Disposition: A | Discharge status: Home / Self Care | Payer: Medicare Other | Source: Ambulatory Visit | Attending: Internal Medicine | Admitting: Internal Medicine

## 2022-11-11 ENCOUNTER — Ambulatory Visit (HOSPITAL_COMMUNITY)
Admission: RE | Admit: 2022-11-11 | Discharge: 2022-11-11 | Disposition: A | Discharge status: Home / Self Care | Payer: Medicare Other | Source: Ambulatory Visit | Attending: Acute Care | Admitting: Acute Care

## 2022-11-11 ENCOUNTER — Other Ambulatory Visit (HOSPITAL_COMMUNITY)
Admission: RE | Admit: 2022-11-11 | Discharge: 2022-11-11 | Disposition: A | Discharge status: Home / Self Care | Payer: Medicare Other | Source: Ambulatory Visit | Attending: Interventional Cardiology | Admitting: Interventional Cardiology

## 2022-11-11 DIAGNOSIS — I25118 Atherosclerotic heart disease of native coronary artery with other forms of angina pectoris: Secondary | ICD-10-CM | POA: Diagnosis not present

## 2022-11-11 DIAGNOSIS — Z87891 Personal history of nicotine dependence: Secondary | ICD-10-CM | POA: Insufficient documentation

## 2022-11-11 DIAGNOSIS — J449 Chronic obstructive pulmonary disease, unspecified: Secondary | ICD-10-CM

## 2022-11-11 LAB — HEPATIC FUNCTION PANEL
ALT: 17 U/L (ref 0–44)
AST: 22 U/L (ref 15–41)
Albumin: 3.2 g/dL — ABNORMAL LOW (ref 3.5–5.0)
Alkaline Phosphatase: 79 U/L (ref 38–126)
Bilirubin, Direct: 0.1 mg/dL (ref 0.0–0.2)
Indirect Bilirubin: 0.7 mg/dL (ref 0.3–0.9)
Total Bilirubin: 0.8 mg/dL (ref 0.3–1.2)
Total Protein: 7.2 g/dL (ref 6.5–8.1)

## 2022-11-11 LAB — LIPID PANEL
Cholesterol: 194 mg/dL (ref 0–200)
HDL: 43 mg/dL (ref >40)
LDL Cholesterol: 132 mg/dL — ABNORMAL HIGH (ref 0–99)
Total CHOL/HDL Ratio: 4.5 RATIO
Triglycerides: 95 mg/dL (ref <150)
VLDL: 19 mg/dL (ref 0–40)

## 2022-11-11 NOTE — Progress Notes (Signed)
Daily Session Note  Patient Details  Name: Chase Lloyd MRN: [id number] Date of Birth: 1956/05/23 Referring Provider:   Flowsheet Row PULMONARY REHAB COPD ORIENTATION from 09/12/2022 in [place]  Referring Provider Dr. Shearon Stalls       Encounter Date: 11/11/2022  Check In:  Session Check In - 11/11/22 1325       Check-In   Supervising physician immediately available to respond to emergencies [facility] MD immediately available    Physician(s) Dr Dellia Cloud    Location [facility]    Staff Present Hoy Register MHA, MS, ACSM-CEP;Leana Roe, BS, Exercise Physiologist;Phyllis Billingsley, RN;Malayna Noori Hassell Done, RN, BSN    Virtual Visit No    Medication changes reported     No    Fall or balance concerns reported    Yes    Comments Patient uses a standing walker for ambulation.    Tobacco Cessation No Change    Warm-up and Cool-down Performed as group-led instruction    Resistance Training Performed Yes    VAD Patient? No    PAD/SET Patient? No      Pain Assessment   Currently in Pain? No/denies    Pain Score 0-No pain    Multiple Pain Sites No             Capillary Blood Glucose: Results for orders placed or performed during the hospital encounter of 11/11/22 (from the past 24 hour(s))  Hepatic function panel     Status: Abnormal   Collection Time: 11/11/22  9:00 AM  Result Value Ref Range   Total Protein 7.2 6.5 - 8.1 g/dL   Albumin 3.2 (L) 3.5 - 5.0 g/dL   AST 22 15 - 41 U/L   ALT 17 0 - 44 U/L   Alkaline Phosphatase 79 38 - 126 U/L   Total Bilirubin 0.8 0.3 - 1.2 mg/dL   Bilirubin, Direct 0.1 0.0 - 0.2 mg/dL   Indirect Bilirubin 0.7 0.3 - 0.9 mg/dL  Lipid panel     Status: Abnormal   Collection Time: 11/11/22  9:00 AM  Result Value Ref Range   Cholesterol 194 0 - 200 mg/dL   Triglycerides 95 <150 mg/dL   HDL 43 >40 mg/dL   Total CHOL/HDL Ratio 4.5 RATIO   VLDL 19 0 - 40 mg/dL   LDL Cholesterol 132 (H) 0 - 99 mg/dL       Social History   Tobacco Use  Smoking Status Former   Packs/day: 1.50   Years: 47.00   Total pack years: 70.50   Types: Cigarettes   Quit date: 06/13/2018   Years since quitting: 4.4  Smokeless Tobacco Never    Goals Met:  Proper associated with RPD/PD & O2 Sat Independence with exercise equipment Using PLB without cueing & demonstrates good technique Exercise tolerated well Queuing for purse lip breathing No report of concerns or symptoms today Strength training completed today  Goals Unmet:  Not Applicable  Comments: Checkout at 1430.   Dr. Kathie Dike is Medical Director for [facility].

## 2022-11-12 ENCOUNTER — Telehealth: Payer: Self-pay | Admitting: *Deleted

## 2022-11-12 ENCOUNTER — Ambulatory Visit (HOSPITAL_COMMUNITY): Payer: Medicare Other

## 2022-11-12 NOTE — Telephone Encounter (Signed)
Received call report from [facility] Radiology:   IMPRESSION: 1. New areas of patchy consolidation in the lingula and left lower lobe, likely due to pneumonia. Lung-RADS 0, incomplete. Additional lung cancer screening CT images/or comparison to prior chest CT examinations is needed. Specifically, follow-up lung cancer screening CT in approximately 4 weeks after appropriate therapy, is recommended. These results will be called to the ordering clinician or representative by the Radiologist Assistant, and communication documented in the PACS or [employer]. 2. Extensive pleuroparenchymal scarring bilaterally with postsurgical and post treatment changes in the upper right hemithorax. 3. Trace pericardial fluid, new. 4. 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 5. Small left pleural effusion, new. 6. Aortic atherosclerosis (ICD10-I70.0). Coronary artery calcification. 7.  Emphysema (ICD10-J43.9).  Pt saw Dr Loanne Drilling on 10/20/22 and was treated with Levaquin and Pred taper.  Judson Roch do you want [name] to schedule televisit with you to discuss or defer to Dr Loanne Drilling?

## 2022-11-12 NOTE — Telephone Encounter (Signed)
Spoke to pt regarding CT scan results. PT stated that he did finish the Levaquin and Prednisone that was prescribed on 10/20/22 and he is feeling some better but continues to have a congested productive cough.  Dr Loanne Drilling, pt is not scheduled to see you for follow up until 12/22/2022. Eric Form, NP advised repeating CT scan in 6 weeks.  Please advise on any further treatment or change in follow up if needed.

## 2022-11-12 NOTE — Telephone Encounter (Signed)
Called patient. Overall feels clinically better after his course of antibiotics in December. Though he has a cough, it is not bothersome and does not limit his symptoms. Suspect CT lung findings likely related to prior infection.   Keep his scheduled follow up with and I will order CT scan at the next visit to evaluate for resolution.  No additional action at this time.

## 2022-11-13 ENCOUNTER — Encounter (HOSPITAL_COMMUNITY)
Admission: RE | Admit: 2022-11-13 | Discharge: 2022-11-13 | Disposition: A | Discharge status: Home / Self Care | Payer: Medicare Other | Source: Ambulatory Visit | Attending: Internal Medicine | Admitting: Internal Medicine

## 2022-11-13 ENCOUNTER — Ambulatory Visit (INDEPENDENT_AMBULATORY_CARE_PROVIDER_SITE_OTHER): Payer: Medicare Other

## 2022-11-13 VITALS — Ht 71.0 in | Wt 174.0 lb

## 2022-11-13 DIAGNOSIS — J449 Chronic obstructive pulmonary disease, unspecified: Secondary | ICD-10-CM

## 2022-11-13 DIAGNOSIS — Z0000 Encounter for general adult medical examination without abnormal findings: Secondary | ICD-10-CM

## 2022-11-13 NOTE — Progress Notes (Signed)
Subjective:   Chase Lloyd is a 67 y.o. male who presents for Medicare Annual/Subsequent preventive examination.  Review of Systems    Virtual Visit via Telephone Note  I connected with  [name] on 11/13/22 at  9:00 AM EST by telephone and verified that I am speaking with the correct person using two identifiers.  Location: Patient: Home Provider: Office Persons participating in the virtual visit: patient/Nurse Health Advisor   I discussed the limitations, risks, security and privacy concerns of performing an evaluation and management service by telephone and the availability of in person appointments. The patient expressed understanding and agreed to proceed.  Interactive audio and video telecommunications were attempted between this nurse and patient, however failed, due to patient having technical difficulties OR patient did not have access to video capability.  We continued and completed visit with audio only.  Some vital signs may be absent or patient reported.   [name], LPN  Cardiac Risk Factors include: advanced age (>12mn, >>6women);male gender;hypertension;Other (see comment), Risk factor comments: Dx COPD     Objective:    Today's Vitals   11/13/22 0902  Weight: 174 lb (78.9 kg)  Height: '5\' 11"'$  (1.803 m)  PainSc: 0-No pain   Body mass index is 24.27 kg/m.     11/13/2022    9:10 AM 09/12/2022    1:01 PM 11/16/2021    9:02 AM 11/12/2021    9:24 AM 10/23/2020   11:15 AM 07/30/2019   10:50 AM 04/29/2019    7:35 AM  Advanced Directives  Does Patient Have a Medical Advance Directive? No No No Yes No No No  Type of AScientist, research (medical)Living will     Does patient want to make changes to medical advance directive?    No - Patient declined     Copy of HAvocain Chart?    No - copy requested     Would patient like information on creating a medical advance directive? No - Patient declined No - Patient  declined   No - Patient declined  No - Patient declined    Current Medications (verified) Outpatient Encounter Medications as of 11/13/2022  Medication Sig   albuterol (PROVENTIL) (2.5 MG/3ML) 0.083% nebulizer solution Take 3 mLs (2.5 mg total) by nebulization every 4 (four) hours as needed for wheezing or shortness of breath.   albuterol (VENTOLIN HFA) 108 (90 Base) MCG/ACT inhaler Inhale 2 puffs into the lungs every 6 (six) hours as needed for wheezing or shortness of breath.   aspirin 81 MG chewable tablet Chew 81 mg by mouth daily.   Budeson-Glycopyrrol-Formoterol (BREZTRI AEROSPHERE) 160-9-4.8 MCG/ACT AERO Inhale 2 puffs into the lungs in the morning and at bedtime.   furosemide (LASIX) 20 MG tablet Take 20 mg by mouth daily.   ibuprofen (ADVIL) 800 MG tablet TAKE 1 TABLET BY MOUTH EVERY 8 HOURS AS NEEDED   metoprolol succinate (TOPROL-XL) 25 MG 24 hr tablet Take 1 tablet (25 mg total) by mouth daily.   omeprazole (PRILOSEC) 40 MG capsule Take 1 capsule by mouth twice daily   rosuvastatin (CRESTOR) 10 MG tablet Take 1 tablet (10 mg total) by mouth daily.   sodium chloride (OCEAN) 0.65 % SOLN nasal spray Place 1 spray into both nostrils as needed for congestion.   No facility-administered encounter medications on file as of 11/13/2022.    Allergies (verified) Patient has no known allergies.   History: Past  Medical History:  Diagnosis Date   Arthritis    Back pain    COPD (chronic obstructive pulmonary disease) (HCC)    DDD (degenerative disc disease), lumbar    Depression    Emphysema of lung ([place])    Empyema ([place])    2016  (was in [place])   GERD (gastroesophageal reflux disease)    Hearing loss of right ear    started 2001.     microphone in right ear ,  and hearing aid in left   Hodgkin's disease ([place]) 1999   Hypotension    Meniere's disease    Pneumonia    Tinnitus    Past Surgical History:  Procedure Laterality Date   BACK SURGERY     CERVICAL FUSION  2016    ELBOW SURGERY     EYE SURGERY     IR THORACENTESIS ASP PLEURAL SPACE W/IMG GUIDE  10/08/2018   LAMINECTOMY  1999   lower back surgery     RIGHT/LEFT HEART CATH AND CORONARY ANGIOGRAPHY N/A 04/29/2019   Procedure: RIGHT/LEFT HEART CATH AND CORONARY ANGIOGRAPHY;  Surgeon: Troy Sine, MD;  Location: [facility];  Service: Cardiovascular;  Laterality: N/A;   TONSILLECTOMY     Family History  Problem Relation Age of Onset   Hypertension Father    Diabetes Father    Testicular cancer Brother    Melanoma Brother    Social History   Socioeconomic History   Marital status: Married    Spouse name: Not on file   Number of children: Not on file   Years of education: Not on file   Highest education level: Not on file  Occupational History   Occupation: Retired  Tobacco Use   Smoking status: Former    Packs/day: 1.50    Years: 47.00    Total pack years: 70.50    Types: Cigarettes    Quit date: 06/13/2018    Years since quitting: 4.4   Smokeless tobacco: Never  Vaping Use   Vaping Use: Never used  Substance and Sexual Activity   Alcohol use: Yes    Comment: 24 cans in a week   Drug use: No   Sexual activity: Not on file  Other Topics Concern   Not on file  Social History Narrative   Retied - worked as Furniture conservator/restorer       Social Determinants of Radio broadcast assistant Strain: [place]  (11/13/2022)   Overall Financial Resource Strain (CARDIA)    Difficulty of Paying Living Expenses: Not hard at all  Food Insecurity: No [place] (11/13/2022)   Hunger Vital Sign    Worried About Running Out of Food in the Last Year: Never true    [place] in the Last Year: Never true  Transportation Needs: No Transportation Needs (11/13/2022)   PRAPARE - Hydrologist (Medical): No    Lack of Transportation (Non-Medical): No  Physical Activity: Insufficiently Active (11/13/2022)   Exercise Vital Sign    Days of Exercise per Week: 2 days     Minutes of Exercise per Session: 60 min  Stress: No Stress Concern Present (11/13/2022)   [place]    Feeling of Stress : Not at all  Social Connections: Moderately Isolated (11/13/2022)   Social Connection and Isolation Panel [NHANES]    Frequency of Communication with Friends and Family: More than three times a week  Frequency of Social Gatherings with Friends and Family: More than three times a week    Attends Religious Services: Never    Marine scientist or Organizations: No    Attends Music therapist: Never    Marital Status: Married    Tobacco Counseling Counseling given: Not Answered   Clinical Intake:  Pre-visit preparation completed: No  Pain : No/denies pain Pain Score: 0-No pain     BMI - recorded: 24.27 Nutritional Status: BMI of 19-24  Normal Nutritional Risks: None Diabetes: No  How often do you need to have someone help you when you read instructions, pamphlets, or other written materials from your doctor or pharmacy?: 1 - Never  Diabetic?  No  Interpreter Needed?: No  Information entered by :: Rolene Arbour LPN   Activities of Daily Living    11/13/2022    9:07 AM  In your present state of health, do you have any difficulty performing the following activities:  Hearing? 0  Vision? 0  Difficulty concentrating or making decisions? 0  Walking or climbing stairs? 0  Dressing or bathing? 0  Doing errands, shopping? 0  Preparing Food and eating ? N  Using the Toilet? N  In the past six months, have you accidently leaked urine? N  Do you have problems with loss of bowel control? N  Managing your Medications? N  Managing your Finances? N  Housekeeping or managing your Housekeeping? N    Patient Care Team: [name], NP as PCP - General (Family Medicine) Belva Crome, MD as PCP - Cardiology (Cardiology) [facility], [facility]  (Audiology)  Indicate any recent Medical Services you may have received from other than [facility] providers in the past year (date may be approximate).     Assessment:   This is a routine wellness examination for Arshad.  Hearing/Vision screen Hearing Screening - Comments:: Wears hearing aids Vision Screening - Comments:: Wears rx glasses - up to date with routine eye exams with  Patient deferred  Dietary issues and exercise activities discussed: Current Exercise Habits: Structured exercise class, Type of exercise: Other - see comments (Followed by PT), Time (Minutes): 60, Frequency (Times/Week): 2, Weekly Exercise (Minutes/Week): 120, Intensity: Moderate, Exercise limited by: respiratory conditions(s)   Goals Addressed               This Visit's Progress     Patient Stated (pt-stated)        Stay Alive       Depression Screen    11/13/2022    9:07 AM 09/12/2022    2:06 PM 11/12/2021    9:11 AM 10/23/2020   11:14 AM 12/14/2018    2:43 PM 11/10/2018    2:38 PM  PHQ 2/9 Scores  PHQ - 2 Score 0 0 0 0 6 6  PHQ- 9 Score  '12   14 14    '$ Fall Risk    11/13/2022    9:09 AM 09/12/2022    2:06 PM 11/12/2021    9:18 AM 10/23/2020   11:16 AM 09/28/2019   10:37 AM  Fall Risk   Falls in the past year? 0 0 0 0 0  Comment     Emmi Telephone Survey: data to providers prior to load  Number falls in past yr: 0  0 0   Injury with Fall? 0  0 0   Risk for fall due to : No Fall Risks   Impaired vision;Impaired balance/gait   Risk  for fall due to: Comment  Uses stand up walker.  tinnitus nad vertigo   Follow up Falls prevention discussed Falls evaluation completed  Falls prevention discussed     FALL RISK PREVENTION PERTAINING TO THE HOME:  Any stairs in or around the home? Yes  If so, are there any without handrails? No  Home free of loose throw rugs in walkways, pet beds, electrical cords, etc? Yes  Adequate lighting in your home to reduce risk of falls? Yes   ASSISTIVE DEVICES  UTILIZED TO PREVENT FALLS:  Life alert? No  Use of a cane, walker or w/c? Yes Grab bars in the bathroom? No  Shower chair or bench in shower? Yes  Elevated toilet seat or a handicapped toilet? No   TIMED UP AND GO:  Was the test performed? No . Audio Visit    Cognitive Function:        11/13/2022    9:10 AM 11/12/2021    9:21 AM 10/23/2020   11:19 AM  6CIT Screen  What Year? 0 points 0 points 0 points  What month? 0 points 0 points 0 points  What time? 0 points 0 points   Count back from 20 0 points 0 points 0 points  Months in reverse 0 points 0 points 0 points  Repeat phrase 0 points 0 points 2 points  Total Score 0 points 0 points     Immunizations Immunization History  Administered Date(s) Administered   Fluad Quad(high Dose 65+) 09/02/2019   Influenza,inj,Quad PF,6+ Mos 12/27/2015, 10/11/2018   Pneumococcal Conjugate-13 12/26/2015   Td 01/01/2014   Zoster, Live 08/04/2015    TDAP status: Up to date  Flu Vaccine status: Declined, Education has been provided regarding the importance of this vaccine but patient still declined. Advised may receive this vaccine at local pharmacy or Health Dept. Aware to provide a copy of the vaccination record if obtained from local pharmacy or Health Dept. Verbalized acceptance and understanding.  Pneumococcal vaccine status: Declined,  Education has been provided regarding the importance of this vaccine but patient still declined. Advised may receive this vaccine at local pharmacy or Health Dept. Aware to provide a copy of the vaccination record if obtained from local pharmacy or Health Dept. Verbalized acceptance and understanding.   Covid-19 vaccine status: Declined, Education has been provided regarding the importance of this vaccine but patient still declined. Advised may receive this vaccine at local pharmacy or Health Dept.or vaccine clinic. Aware to provide a copy of the vaccination record if obtained from local pharmacy or  Health Dept. Verbalized acceptance and understanding.  Qualifies for Shingles Vaccine? Yes   Zostavax completed No   Shingrix Completed?: No.    Education has been provided regarding the importance of this vaccine. Patient has been advised to call insurance company to determine out of pocket expense if they have not yet received this vaccine. Advised may also receive vaccine at local pharmacy or Health Dept. Verbalized acceptance and understanding.  Screening Tests Health Maintenance  Topic Date Due   COVID-19 Vaccine (1) 11/29/2022 (Originally 08/27/1961)   INFLUENZA VACCINE  02/01/2023 (Originally 06/03/2022)   Zoster Vaccines- Shingrix (1 of 2) 02/12/2023 (Originally 08/26/1973)   Pneumonia Vaccine 54+ Years old (2 - PPSV23 or PCV20) 11/14/2023 (Originally 02/20/2016)   COLONOSCOPY (Pts 45-84yr Insurance coverage will need to be confirmed)  11/14/2023 (Originally 08/27/1999)   Lung Cancer Screening  11/12/2023   Medicare Annual Wellness (AWV)  11/14/2023   DTaP/Tdap/Td (2 - Tdap) 01/02/2024  Hepatitis C Screening  Completed   HPV VACCINES  Aged Out    Health Maintenance  There are no preventive care reminders to display for this patient.   Colorectal cancer screening: Referral to GI placed Deferred. Pt aware the office will call re: appt.  Lung Cancer Screening: (Low Dose CT Chest recommended if Age 58-80 years, 30 pack-year currently smoking OR have quit w/in 15years.) does not qualify.     Additional Screening:  Hepatitis C Screening: does qualify; Completed 05/05/18  Vision Screening: Recommended annual ophthalmology exams for early detection of glaucoma and other disorders of the eye. Is the patient up to date with their annual eye exam?  Yes  Who is the provider or what is the name of the office in which the patient attends annual eye exams? Deferred If pt is not established with a provider, would they like to be referred to a provider to establish care? No .   Dental  Screening: Recommended annual dental exams for proper oral hygiene  Community Resource Referral / Chronic Care Management:  CRR required this visit?  No   CCM required this visit?  No      Plan:     I have personally reviewed and noted the following in the patient's chart:   Medical and social history Use of alcohol, tobacco or illicit drugs  Current medications and supplements including opioid prescriptions. Patient is not currently taking opioid prescriptions. Functional ability and status Nutritional status Physical activity Advanced directives List of other physicians Hospitalizations, surgeries, and ER visits in previous 12 months Vitals Screenings to include cognitive, depression, and falls Referrals and appointments  In addition, I have reviewed and discussed with patient certain preventive protocols, quality metrics, and best practice recommendations. A written personalized care plan for preventive services as well as general preventive health recommendations were provided to patient.     [name], LPN   03/20/6159   Nurse Notes:  None

## 2022-11-13 NOTE — Progress Notes (Signed)
Daily Session Note  Patient Details  Name: Chase Lloyd MRN: [id number] Date of Birth: 18-Jan-1956 Referring Provider:   Flowsheet Row PULMONARY REHAB COPD ORIENTATION from 09/12/2022 in [place]  Referring Provider Dr. Shearon Stalls       Encounter Date: 11/13/2022  Check In:  Session Check In - 11/13/22 1330       Check-In   Supervising physician immediately available to respond to emergencies [facility] MD immediately available    Physician(s) Dr Dellia Cloud    Location [facility]    Staff Present Leana Roe, BS, Exercise Physiologist;Adien Kimmel BSN, RN    Virtual Visit No    Medication changes reported     No    Fall or balance concerns reported    Yes    Comments Patient uses a standing walker for ambulation.    Tobacco Cessation No Change    Warm-up and Cool-down Performed as group-led instruction    Resistance Training Performed Yes    VAD Patient? No    PAD/SET Patient? No      Pain Assessment   Currently in Pain? No/denies    Pain Score 0-No pain    Multiple Pain Sites No             Capillary Blood Glucose: No results found for this or any previous visit (from the past 24 hour(s)).    Social History   Tobacco Use  Smoking Status Former   Packs/day: 1.50   Years: 47.00   Total pack years: 70.50   Types: Cigarettes   Quit date: 06/13/2018   Years since quitting: 4.4  Smokeless Tobacco Never    Goals Met:  Proper associated with RPD/PD & O2 Sat Independence with exercise equipment Using PLB without cueing & demonstrates good technique Exercise tolerated well Queuing for purse lip breathing No report of concerns or symptoms today Strength training completed today  Goals Unmet:  Not Applicable  Comments: check out at 2:30   Dr. Kathie Dike is Medical Director for [facility].

## 2022-11-13 NOTE — Patient Instructions (Addendum)
Mr. Chase Lloyd , Thank you for taking time to come for your Medicare Wellness Visit. I appreciate your ongoing commitment to your health goals. Please review the following plan we discussed and let me know if I can assist you in the future.   These are the goals we discussed:  Goals       Patient Stated (pt-stated)      Stay Alive        This is a list of the screening recommended for you and due dates:  Health Maintenance  Topic Date Due   COVID-19 Vaccine (1) 11/29/2022*   Flu Shot  02/01/2023*   Zoster (Shingles) Vaccine (1 of 2) 02/12/2023*   Pneumonia Vaccine (2 - PPSV23 or PCV20) 11/14/2023*   Colon Cancer Screening  11/14/2023*   Screening for Lung Cancer  11/12/2023   Medicare Annual Wellness Visit  11/14/2023   DTaP/Tdap/Td vaccine (2 - Tdap) 01/02/2024   Hepatitis C Screening: USPSTF Recommendation to screen - Ages 18-79 yo.  Completed   HPV Vaccine  Aged Out  *Topic was postponed. The date shown is not the original due date.    Advanced directives: Advance directive discussed with you today. Even though you declined this today, please call our office should you change your mind, and we can give you the proper paperwork for you to fill out.   Conditions/risks identified: None  Next appointment: Follow up in one year for your annual wellness visit.    Preventive Care 43 Years and Older, Male  Preventive care refers to lifestyle choices and visits with your health care provider that can promote health and wellness. What does preventive care include? A yearly physical exam. This is also called an annual well check. Dental exams once or twice a year. Routine eye exams. Ask your health care provider how often you should have your eyes checked. Personal lifestyle choices, including: Daily care of your teeth and gums. Regular physical activity. Eating a healthy diet. Avoiding tobacco and drug use. Limiting alcohol use. Practicing safe sex. Taking low doses of aspirin  every day. Taking vitamin and mineral supplements as recommended by your health care provider. What happens during an annual well check? The services and screenings done by your health care provider during your annual well check will depend on your age, overall health, lifestyle risk factors, and family history of disease. Counseling  Your health care provider may ask you questions about your: Alcohol use. Tobacco use. Drug use. Emotional well-being. Home and relationship well-being. Sexual activity. Eating habits. History of falls. Memory and ability to understand (cognition). Work and work Statistician. Screening  You may have the following tests or measurements: Height, weight, and BMI. Blood pressure. Lipid and cholesterol levels. These may be checked every 5 years, or more frequently if you are over 68 years old. Skin check. Lung cancer screening. You may have this screening every year starting at age 7 if you have a 30-pack-year history of smoking and currently smoke or have quit within the past 15 years. Fecal occult blood test (FOBT) of the stool. You may have this test every year starting at age 43. Flexible sigmoidoscopy or colonoscopy. You may have a sigmoidoscopy every 5 years or a colonoscopy every 10 years starting at age 89. Prostate cancer screening. Recommendations will vary depending on your family history and other risks. Hepatitis C blood test. Hepatitis B blood test. Sexually transmitted disease (STD) testing. Diabetes screening. This is done by checking your blood sugar (glucose) after you  have not eaten for a while (fasting). You may have this done every 1-3 years. Abdominal aortic aneurysm (AAA) screening. You may need this if you are a current or former smoker. Osteoporosis. You may be screened starting at age 74 if you are at high risk. Talk with your health care provider about your test results, treatment options, and if necessary, the need for more  tests. Vaccines  Your health care provider may recommend certain vaccines, such as: Influenza vaccine. This is recommended every year. Tetanus, diphtheria, and acellular pertussis (Tdap, Td) vaccine. You may need a Td booster every 10 years. Zoster vaccine. You may need this after age 11. Pneumococcal 13-valent conjugate (PCV13) vaccine. One dose is recommended after age 71. Pneumococcal polysaccharide (PPSV23) vaccine. One dose is recommended after age 51. Talk to your health care provider about which screenings and vaccines you need and how often you need them. This information is not intended to replace advice given to you by your health care provider. Make sure you discuss any questions you have with your health care provider. Document Released: 11/16/2015 Document Revised: 07/09/2016 Document Reviewed: 08/21/2015 Elsevier Interactive Patient Education  2017 [place] Prevention in the Home Falls can cause injuries. They can happen to people of all ages. There are many things you can do to make your home safe and to help prevent falls. What can I do on the outside of my home? Regularly fix the edges of walkways and driveways and fix any cracks. Remove anything that might make you trip as you walk through a door, such as a raised step or threshold. Trim any bushes or trees on the path to your home. Use bright outdoor lighting. Clear any walking paths of anything that might make someone trip, such as rocks or tools. Regularly check to see if handrails are loose or broken. Make sure that both sides of any steps have handrails. Any raised decks and porches should have guardrails on the edges. Have any leaves, snow, or ice cleared regularly. Use sand or salt on walking paths during winter. Clean up any spills in your garage right away. This includes oil or grease spills. What can I do in the bathroom? Use night lights. Install grab bars by the toilet and in the tub and shower.  Do not use towel bars as grab bars. Use non-skid mats or decals in the tub or shower. If you need to sit down in the shower, use a plastic, non-slip stool. Keep the floor dry. Clean up any water that spills on the floor as soon as it happens. Remove soap buildup in the tub or shower regularly. Attach bath mats securely with double-sided non-slip rug tape. Do not have throw rugs and other things on the floor that can make you trip. What can I do in the bedroom? Use night lights. Make sure that you have a light by your bed that is easy to reach. Do not use any sheets or blankets that are too big for your bed. They should not hang down onto the floor. Have a firm chair that has side arms. You can use this for support while you get dressed. Do not have throw rugs and other things on the floor that can make you trip. What can I do in the kitchen? Clean up any spills right away. Avoid walking on wet floors. Keep items that you use a lot in easy-to-reach places. If you need to reach something above you, use a strong step  stool that has a grab bar. Keep electrical cords out of the way. Do not use floor polish or wax that makes floors slippery. If you must use wax, use non-skid floor wax. Do not have throw rugs and other things on the floor that can make you trip. What can I do with my stairs? Do not leave any items on the stairs. Make sure that there are handrails on both sides of the stairs and use them. Fix handrails that are broken or loose. Make sure that handrails are as [place] as the stairways. Check any carpeting to make sure that it is firmly attached to the stairs. Fix any carpet that is loose or worn. Avoid having throw rugs at the top or bottom of the stairs. If you do have throw rugs, attach them to the floor with carpet tape. Make sure that you have a light switch at the top of the stairs and the bottom of the stairs. If you do not have them, ask someone to add them for you. What else  can I do to help prevent falls? Wear shoes that: Do not have high heels. Have rubber bottoms. Are comfortable and fit you well. Are closed at the toe. Do not wear sandals. If you use a stepladder: Make sure that it is fully opened. Do not climb a closed stepladder. Make sure that both sides of the stepladder are locked into place. Ask someone to hold it for you, if possible. Clearly mark and make sure that you can see: Any grab bars or handrails. First and last steps. Where the edge of each step is. Use tools that help you move around (mobility aids) if they are needed. These include: Canes. Walkers. Scooters. Crutches. Turn on the lights when you go into a dark area. Replace any light bulbs as soon as they burn out. Set up your furniture so you have a clear path. Avoid moving your furniture around. If any of your floors are uneven, fix them. If there are any pets around you, be aware of where they are. Review your medicines with your doctor. Some medicines can make you feel dizzy. This can increase your chance of falling. Ask your doctor what other things that you can do to help prevent falls. This information is not intended to replace advice given to you by your health care provider. Make sure you discuss any questions you have with your health care provider. Document Released: 08/16/2009 Document Revised: 03/27/2016 Document Reviewed: 11/24/2014 Elsevier Interactive Patient Education  2017 [employer].

## 2022-11-17 ENCOUNTER — Ambulatory Visit (HOSPITAL_COMMUNITY): Payer: Medicare Other | Attending: Cardiology

## 2022-11-17 ENCOUNTER — Other Ambulatory Visit: Payer: Self-pay | Admitting: Interventional Cardiology

## 2022-11-17 DIAGNOSIS — I10 Essential (primary) hypertension: Secondary | ICD-10-CM | POA: Insufficient documentation

## 2022-11-17 DIAGNOSIS — I25118 Atherosclerotic heart disease of native coronary artery with other forms of angina pectoris: Secondary | ICD-10-CM | POA: Diagnosis not present

## 2022-11-17 DIAGNOSIS — E785 Hyperlipidemia, unspecified: Secondary | ICD-10-CM

## 2022-11-17 LAB — ECHOCARDIOGRAM COMPLETE
Area-P 1/2: 3.81 cm2
P 1/2 time: 425 msec
S' Lateral: 3.3 cm

## 2022-11-18 ENCOUNTER — Encounter (HOSPITAL_COMMUNITY)
Admission: RE | Admit: 2022-11-18 | Discharge: 2022-11-18 | Disposition: A | Discharge status: Home / Self Care | Payer: Medicare Other | Source: Ambulatory Visit | Attending: Internal Medicine | Admitting: Internal Medicine

## 2022-11-18 VITALS — Wt 170.6 lb

## 2022-11-18 DIAGNOSIS — J449 Chronic obstructive pulmonary disease, unspecified: Secondary | ICD-10-CM

## 2022-11-18 NOTE — Progress Notes (Signed)
Daily Session Note  Patient Details  Name: Chase Lloyd MRN: [id number] Date of Birth: 09/26/1956 Referring Provider:   Flowsheet Row PULMONARY REHAB COPD ORIENTATION from 09/12/2022 in [place]  Referring Provider Dr. Shearon Stalls       Encounter Date: 11/18/2022  Check In:  Session Check In - 11/18/22 1326       Check-In   Supervising physician immediately available to respond to emergencies [facility] MD immediately available    Physician(s) Dr Harl Bowie    Location [facility]    Staff Present Leana Roe, BS, Exercise Physiologist;Hillary Troutman BSN, RN;Quirino Kakos Hassell Done, RN, BSN    Virtual Visit No    Medication changes reported     No    Fall or balance concerns reported    Yes    Comments Patient uses a standing walker for ambulation.    Tobacco Cessation No Change    Warm-up and Cool-down Performed as group-led instruction    Resistance Training Performed Yes    VAD Patient? No    PAD/SET Patient? No      Pain Assessment   Currently in Pain? No/denies    Pain Score 0-No pain    Multiple Pain Sites No             Capillary Blood Glucose: No results found for this or any previous visit (from the past 24 hour(s)).    Social History   Tobacco Use  Smoking Status Former   Packs/day: 1.50   Years: 47.00   Total pack years: 70.50   Types: Cigarettes   Quit date: 06/13/2018   Years since quitting: 4.4  Smokeless Tobacco Never    Goals Met:  Proper associated with RPD/PD & O2 Sat Independence with exercise equipment Using PLB without cueing & demonstrates good technique Exercise tolerated well Queuing for purse lip breathing No report of concerns or symptoms today Strength training completed today  Goals Unmet:  Not Applicable  Comments: Checkout at 1430.   Dr. Kathie Dike is Medical Director for [facility] Pulmonary Re[facility].

## 2022-11-20 ENCOUNTER — Encounter (HOSPITAL_COMMUNITY)
Admission: RE | Admit: 2022-11-20 | Discharge: 2022-11-20 | Disposition: A | Discharge status: Home / Self Care | Payer: Medicare Other | Source: Ambulatory Visit | Attending: Internal Medicine | Admitting: Internal Medicine

## 2022-11-20 DIAGNOSIS — J449 Chronic obstructive pulmonary disease, unspecified: Secondary | ICD-10-CM | POA: Diagnosis not present

## 2022-11-20 NOTE — Progress Notes (Signed)
Daily Session Note  Patient Details  Name: Chase Lloyd MRN: [id number] Date of Birth: Mar 16, 1956 Referring Provider:   Flowsheet Row PULMONARY REHAB COPD ORIENTATION from 09/12/2022 in [place]  Referring Provider Dr. Shearon Stalls       Encounter Date: 11/20/2022  Check In:  Session Check In - 11/20/22 1328       Check-In   Supervising physician immediately available to respond to emergencies [facility] MD immediately available    Physician(s) Dr Harl Bowie    Location [facility]    Staff Present Leana Roe, BS, Exercise Physiologist;Patrece Tallie Hassell Done, RN, Jennye Moccasin, RN, BSN    Virtual Visit No    Medication changes reported     No    Fall or balance concerns reported    Yes    Comments Patient uses a standing walker for ambulation.    Tobacco Cessation No Change    Warm-up and Cool-down Performed as group-led instruction    Resistance Training Performed Yes    VAD Patient? No    PAD/SET Patient? No      Pain Assessment   Currently in Pain? No/denies    Pain Score 0-No pain    Multiple Pain Sites No             Capillary Blood Glucose: No results found for this or any previous visit (from the past 24 hour(s)).    Social History   Tobacco Use  Smoking Status Former   Packs/day: 1.50   Years: 47.00   Total pack years: 70.50   Types: Cigarettes   Quit date: 06/13/2018   Years since quitting: 4.4  Smokeless Tobacco Never    Goals Met:  Proper associated with RPD/PD & O2 Sat Independence with exercise equipment Using PLB without cueing & demonstrates good technique Exercise tolerated well Queuing for purse lip breathing No report of concerns or symptoms today Strength training completed today  Goals Unmet:  Not Applicable  Comments: Checkout at 1430.   Dr. Kathie Dike is Medical Director for [facility] Pulmonary Re[facility].

## 2022-11-25 ENCOUNTER — Encounter (HOSPITAL_COMMUNITY)
Admission: RE | Admit: 2022-11-25 | Discharge: 2022-11-25 | Disposition: A | Discharge status: Home / Self Care | Payer: Medicare Other | Source: Ambulatory Visit | Attending: Internal Medicine | Admitting: Internal Medicine

## 2022-11-25 DIAGNOSIS — J449 Chronic obstructive pulmonary disease, unspecified: Secondary | ICD-10-CM | POA: Diagnosis not present

## 2022-11-25 NOTE — Progress Notes (Signed)
Daily Session Note  Patient Details  Name: Chase Lloyd MRN: [id number] Date of Birth: 1955-11-29 Referring Provider:   Flowsheet Row PULMONARY REHAB COPD ORIENTATION from 09/12/2022 in [place]  Referring Provider Dr. Shearon Stalls       Encounter Date: 11/25/2022  Check In:  Session Check In - 11/25/22 1330       Check-In   Supervising physician immediately available to respond to emergencies [facility] MD immediately available    Physician(s) Dr Domenic Polite    Location [facility]    Staff Present Leana Roe, BS, Exercise Physiologist;Temitope Flammer Hassell Done, RN, BSN;Dalton Sherrie George, MS, ACSM-CEP    Virtual Visit No    Medication changes reported     No    Fall or balance concerns reported    Yes    Comments Patient uses a standing walker for ambulation.    Tobacco Cessation No Change    Warm-up and Cool-down Performed as group-led instruction    Resistance Training Performed Yes    VAD Patient? No    PAD/SET Patient? No      Pain Assessment   Currently in Pain? No/denies    Pain Score 0-No pain    Multiple Pain Sites No             Capillary Blood Glucose: No results found for this or any previous visit (from the past 24 hour(s)).    Social History   Tobacco Use  Smoking Status Former   Packs/day: 1.50   Years: 47.00   Total pack years: 70.50   Types: Cigarettes   Quit date: 06/13/2018   Years since quitting: 4.4  Smokeless Tobacco Never    Goals Met:  Proper associated with RPD/PD & O2 Sat Independence with exercise equipment Using PLB without cueing & demonstrates good technique Exercise tolerated well Queuing for purse lip breathing No report of concerns or symptoms today Strength training completed today  Goals Unmet:  Not Applicable  Comments: Checkout at 1430.   Dr. Kathie Dike is Medical Director for [facility] Pulmonary Rehab.

## 2022-11-27 ENCOUNTER — Encounter (HOSPITAL_COMMUNITY)
Admission: RE | Admit: 2022-11-27 | Discharge: 2022-11-27 | Disposition: A | Discharge status: Home / Self Care | Payer: Medicare Other | Source: Ambulatory Visit | Attending: Internal Medicine | Admitting: Internal Medicine

## 2022-11-27 DIAGNOSIS — J449 Chronic obstructive pulmonary disease, unspecified: Secondary | ICD-10-CM

## 2022-11-27 NOTE — Progress Notes (Signed)
Daily Session Note  Patient Details  Name: Chase Lloyd MRN: [id number] Date of Birth: [birth year] Referring Provider:   Flowsheet Row PULMONARY REHAB COPD ORIENTATION from 09/12/2022 in [place]  Referring Provider Dr. Shearon Stalls       Encounter Date: 11/27/2022  Check In:  Session Check In - 11/27/22 1330       Check-In   Supervising physician immediately available to respond to emergencies [facility] MD immediately available    Physician(s) Dr Domenic Polite    Location [facility]    Staff Present Leana Roe, BS, Exercise Physiologist;Daphyne Hassell Done, RN, BSN;Lovelace Cerveny BSN, RN    Virtual Visit No    Medication changes reported     No    Fall or balance concerns reported    Yes    Comments Patient uses a standing walker for ambulation.    Tobacco Cessation No Change    Warm-up and Cool-down Performed as group-led instruction    Resistance Training Performed Yes    VAD Patient? No    PAD/SET Patient? No      Pain Assessment   Currently in Pain? No/denies    Pain Score 0-No pain    Multiple Pain Sites No             Capillary Blood Glucose: No results found for this or any previous visit (from the past 24 hour(s)).    Social History   Tobacco Use  Smoking Status Former   Packs/day: 1.50   Years: 47.00   Total pack years: 70.50   Types: Cigarettes   Quit date: 06/13/2018   Years since quitting: 4.4  Smokeless Tobacco Never    Goals Met:  Proper associated with RPD/PD & O2 Sat Independence with exercise equipment Using PLB without cueing & demonstrates good technique Exercise tolerated well Queuing for purse lip breathing No report of concerns or symptoms today Strength training completed today  Goals Unmet:  Not Applicable  Comments: check out at 14:30   Dr. Kathie Dike is Medical Director for [facility] Pulmonary Re[facility].

## 2022-12-02 ENCOUNTER — Encounter (HOSPITAL_COMMUNITY)
Admission: RE | Admit: 2022-12-02 | Discharge: 2022-12-02 | Disposition: A | Discharge status: Home / Self Care | Payer: Medicare Other | Source: Ambulatory Visit | Attending: Internal Medicine | Admitting: Internal Medicine

## 2022-12-02 VITALS — Wt 172.6 lb

## 2022-12-02 DIAGNOSIS — J449 Chronic obstructive pulmonary disease, unspecified: Secondary | ICD-10-CM

## 2022-12-02 NOTE — Progress Notes (Signed)
Daily Session Note  Patient Details  Name: Chase Lloyd MRN: [id number] Date of Birth: [birth year] Referring Provider:   Flowsheet Row PULMONARY REHAB COPD ORIENTATION from 09/12/2022 in [place]  Referring Provider Dr. Shearon Stalls       Encounter Date: 12/02/2022  Check In:  Session Check In - 12/02/22 1330       Check-In   Supervising physician immediately available to respond to emergencies [facility] MD immediately available    Physician(s) Dr Dellia Cloud    Location [facility]    Staff Present Leana Roe, BS, Exercise Physiologist;Courney Garrod Hassell Done, RN, BSN;Phyllis Billingsley, RN    Virtual Visit No    Medication changes reported     No    Fall or balance concerns reported    Yes    Comments Patient uses a standing walker for ambulation.    Tobacco Cessation No Change    Warm-up and Cool-down Performed as group-led instruction    Resistance Training Performed Yes    VAD Patient? No    PAD/SET Patient? No      Pain Assessment   Currently in Pain? No/denies    Pain Score 0-No pain    Multiple Pain Sites No             Capillary Blood Glucose: No results found for this or any previous visit (from the past 24 hour(s)).    Social History   Tobacco Use  Smoking Status Former   Packs/day: 1.50   Years: 47.00   Total pack years: 70.50   Types: Cigarettes   Quit date: 06/13/2018   Years since quitting: 4.4  Smokeless Tobacco Never    Goals Met:  Proper associated with RPD/PD & O2 Sat Independence with exercise equipment Using PLB without cueing & demonstrates good technique Exercise tolerated well Queuing for purse lip breathing No report of concerns or symptoms today Strength training completed today  Goals Unmet:  Not Applicable  Comments: Checkout at 1430.   Dr. Kathie Dike is Medical Director for [facility] Pulmonary Rehab.

## 2022-12-02 NOTE — Progress Notes (Signed)
I have reviewed a Home Exercise Prescription with Chase Lloyd . Chase Lloyd is not currently exercising at home.  The patient was advised to walk 3 days a week for 30-45 minutes.  Chase Lloyd and I discussed how to progress their exercise prescription.  The patient stated that their goals were stay moving and keep his strength.  The patient stated that they understand the exercise prescription.  We reviewed exercise guidelines, target heart rate during exercise, RPE Scale, weather conditions, NTG use, endpoints for exercise, warmup and cool down.  Patient is encouraged to come to me with any questions. I will continue to follow up with the patient to assist them with progression and safety.

## 2022-12-03 NOTE — Progress Notes (Signed)
Pulmonary Individual Treatment Plan  Patient Details  Name: Chase Lloyd MRN: [id number] Date of Birth: [birth year] Referring Provider:   Flowsheet Row PULMONARY REHAB COPD ORIENTATION from 09/12/2022 in [facility]  Referring Provider Dr. Shearon Stalls       Initial Encounter Date:  Flowsheet Row PULMONARY REHAB COPD ORIENTATION from 09/12/2022 in [facility]  Date 09/12/22       Visit Diagnosis: Stage 4 very severe COPD by GOLD classification ([place])  Patient's Home Medications on Admission:   Current Outpatient Medications:    albuterol (PROVENTIL) (2.5 MG/3ML) 0.083% nebulizer solution, Take 3 mLs (2.5 mg total) by nebulization every 4 (four) hours as needed for wheezing or shortness of breath., Disp: 75 mL, Rfl: 12   albuterol (VENTOLIN HFA) 108 (90 Base) MCG/ACT inhaler, Inhale 2 puffs into the lungs every 6 (six) hours as needed for wheezing or shortness of breath., Disp: 8 g, Rfl: 0   aspirin 81 MG chewable tablet, Chew 81 mg by mouth daily., Disp: , Rfl:    Budeson-Glycopyrrol-Formoterol (BREZTRI AEROSPHERE) 160-9-4.8 MCG/ACT AERO, Inhale 2 puffs into the lungs in the morning and at bedtime., Disp: , Rfl:    furosemide (LASIX) 20 MG tablet, Take 20 mg by mouth daily., Disp: , Rfl:    ibuprofen (ADVIL) 800 MG tablet, TAKE 1 TABLET BY MOUTH EVERY 8 HOURS AS NEEDED, Disp: 270 tablet, Rfl: 0   metoprolol succinate (TOPROL-XL) 25 MG 24 hr tablet, Take 1 tablet (25 mg total) by mouth daily., Disp: 90 tablet, Rfl: 3   omeprazole (PRILOSEC) 40 MG capsule, Take 1 capsule by mouth twice daily, Disp: 180 capsule, Rfl: 0   rosuvastatin (CRESTOR) 10 MG tablet, Take 1 tablet (10 mg total) by mouth daily., Disp: 90 tablet, Rfl: 3   sodium chloride (OCEAN) 0.65 % SOLN nasal spray, Place 1 spray into both nostrils as needed for congestion., Disp: , Rfl:   Past Medical History: Past Medical History:  Diagnosis Date   Arthritis    Back pain    COPD  (chronic obstructive pulmonary disease) (HCC)    DDD (degenerative disc disease), lumbar    Depression    Emphysema of lung ([place])    Empyema ([place])    2016  (was in [place])   GERD (gastroesophageal reflux disease)    Hearing loss of right ear    started 2001.     microphone in right ear ,  and hearing aid in left   Hodgkin's disease ([place]) 1999   Hypotension    Meniere's disease    Pneumonia    Tinnitus     Tobacco Use: Social History   Tobacco Use  Smoking Status Former   Packs/day: 1.50   Years: 47.00   Total pack years: 70.50   Types: Cigarettes   Quit date: 06/13/2018   Years since quitting: 4.4  Smokeless Tobacco Never    Labs: Review Flowsheet  More data may exist      Latest Ref Rng & Units 05/05/2018 04/29/2019 07/01/2019 11/22/2021 11/11/2022  Labs for ITP Cardiac and Pulmonary Rehab  Cholestrol 0 - 200 mg/dL 182  - 136  173  194   LDL (calc) 0 - 99 mg/dL 91  - 66  81  132   HDL-C >40 mg/dL 60.50  - 47  53.20  43   Trlycerides <150 mg/dL 153.0  - 116  196.0  95   PH, Arterial 7.350 - 7.450 - 7.338  - - -  PCO2 arterial 32.0 - 48.0 mmHg - 54.3  - - -  Bicarbonate 20.0 - 28.0 mmol/L - 29.2  30.0  31.0  - - -  TCO2 22 - 32 mmol/L - 31  32  33  - - -  O2 Saturation % - 100.0  73.0  72.0  - - -    Capillary Blood Glucose: No results found for: "GLUCAP"   Pulmonary Assessment Scores:  Pulmonary Assessment Scores     Row Name 09/12/22 1422         ADL UCSD   ADL Phase Entry     SOB Score total 66     Rest 1     Walk 2     Stairs 4     Bath 3     Dress 2     Shop 1       CAT Score   CAT Score 30       mMRC Score   mMRC Score 3             UCSD: Self-administered rating of dyspnea associated with activities of daily living (ADLs) 6-point scale (0 = "not at all" to 5 = "maximal or unable to do because of breathlessness")  Scoring Scores range from 0 to 120.  Minimally important difference is 5 units  CAT: CAT can identify the health  impairment of COPD patients and is better correlated with disease progression.  CAT has a scoring range of zero to 40. The CAT score is classified into four groups of low (less than 10), medium (10 - 20), high (21-30) and very high (31-40) based on the impact level of disease on health status. A CAT score over 10 suggests significant symptoms.  A worsening CAT score could be explained by an exacerbation, poor medication adherence, poor inhaler technique, or progression of COPD or comorbid conditions.  CAT MCID is 2 points  mMRC: mMRC (Modified Medical Research Council) Dyspnea Scale is used to assess the degree of baseline functional disability in patients of respiratory disease due to dyspnea. No minimal important difference is established. A decrease in score of 1 point or greater is considered a positive change.   Pulmonary Function Assessment:  Pulmonary Function Assessment - 09/12/22 1422       Post Bronchodilator Spirometry Results   FVC% 45 %    FEV1% 22 %    FEV1/FVC Ratio 45      Breath   Shortness of Breath Panic with Shortness of Breath;Yes;Fear of Shortness of Breath;Limiting activity             Exercise Target Goals: Exercise Program Goal: Individual exercise prescription set using results from initial 6 min walk test and THRR while considering  patient's activity barriers and safety.   Exercise Prescription Goal: Initial exercise prescription builds to 30-45 minutes a day of aerobic activity, 2-3 days per week.  Home exercise guidelines will be given to patient during program as part of exercise prescription that the participant will acknowledge.  Activity Barriers & Risk Stratification:  Activity Barriers & Cardiac Risk Stratification - 09/12/22 1303       Activity Barriers & Cardiac Risk Stratification   Activity Barriers Left Hip Replacement;Back Problems;Deconditioning;Shortness of Breath;Arthritis;Assistive Device    Cardiac Risk Stratification Moderate              6 Minute Walk:  6 Minute Walk     Row Name 09/12/22 1413         6  Minute Walk   Phase Initial     Distance 850 feet     Walk Time 6 minutes     # of Rest Breaks 0     MPH 1.61     METS 3.07     RPE 12     Perceived Dyspnea  13     VO2 Peak 10.76     Symptoms No     Resting HR 95 bpm     Resting BP 180/60     Resting Oxygen Saturation  97 %     Exercise Oxygen Saturation  during 6 min walk 90 %     Max Ex. HR 114 bpm     Max Ex. BP 186/54     2 Minute Post BP 168/50       Interval HR   1 Minute HR 102     2 Minute HR 107     3 Minute HR 110     4 Minute HR 112     5 Minute HR 114     6 Minute HR 114     2 Minute Post HR 107     Interval Heart Rate? Yes       Interval Oxygen   Interval Oxygen? Yes     Baseline Oxygen Saturation % 97 %     1 Minute Oxygen Saturation % 92 %     1 Minute Liters of Oxygen 2 L     2 Minute Oxygen Saturation % 92 %     2 Minute Liters of Oxygen 2 L     3 Minute Oxygen Saturation % 90 %     3 Minute Liters of Oxygen 2 L     4 Minute Oxygen Saturation % 91 %     4 Minute Liters of Oxygen 2 L     5 Minute Oxygen Saturation % 91 %     5 Minute Liters of Oxygen 2 L     6 Minute Oxygen Saturation % 91 %     6 Minute Liters of Oxygen 2 L     2 Minute Post Oxygen Saturation % 95 %     2 Minute Post Liters of Oxygen 2 L              Oxygen Initial Assessment:  Oxygen Initial Assessment - 12/02/22 1510       Home Oxygen   Home Oxygen Device Portable Concentrator;Home Concentrator    Sleep Oxygen Prescription Continuous    Liters per minute 2    Home Exercise Oxygen Prescription Continuous    Liters per minute 2    Home Resting Oxygen Prescription Continuous    Liters per minute 2    Compliance with Home Oxygen Use Yes      Initial 6 min Walk   Oxygen Used Continuous    Liters per minute 2      Program Oxygen Prescription   Program Oxygen Prescription Continuous    Liters per minute 4    Comments Pt  has been increasing oxygen to 4L when exercising      Intervention   Short Term Goals To learn and exhibit compliance with exercise, home and travel O2 prescription;To learn and understand importance of monitoring SPO2 with pulse oximeter and demonstrate accurate use of the pulse oximeter.;To learn and understand importance of maintaining oxygen saturations>88%;To learn and demonstrate proper pursed lip breathing techniques or other breathing techniques.     [place]  Term  Goals Exhibits compliance with exercise, home  and travel O2 prescription;Verbalizes importance of monitoring SPO2 with pulse oximeter and return demonstration;Maintenance of O2 saturations>88%;Exhibits proper breathing techniques, such as pursed lip breathing or other method taught during program session;Compliance with respiratory medication             Oxygen Re-Evaluation:  Oxygen Re-Evaluation     Row Name 10/07/22 1443 11/04/22 1553           Program Oxygen Prescription   Program Oxygen Prescription Continuous Continuous      Liters per minute 4 4      Comments Pt has been increasing oxygen to 4L when exercising Pt has been increasing oxygen to 4L when exercising        Home Oxygen   Home Oxygen Device Portable Concentrator;Home Concentrator Portable Concentrator;Home Concentrator      Sleep Oxygen Prescription Continuous Continuous      Liters per minute 2 2      Home Exercise Oxygen Prescription Continuous Continuous      Liters per minute 2 2      Home Resting Oxygen Prescription Continuous Continuous      Liters per minute 2 2      Compliance with Home Oxygen Use Yes Yes        Goals/Expected Outcomes   Short Term Goals To learn and exhibit compliance with exercise, home and travel O2 prescription;To learn and understand importance of monitoring SPO2 with pulse oximeter and demonstrate accurate use of the pulse oximeter.;To learn and understand importance of maintaining oxygen saturations>88%;To learn and  demonstrate proper pursed lip breathing techniques or other breathing techniques.  To learn and exhibit compliance with exercise, home and travel O2 prescription;To learn and understand importance of monitoring SPO2 with pulse oximeter and demonstrate accurate use of the pulse oximeter.;To learn and understand importance of maintaining oxygen saturations>88%;To learn and demonstrate proper pursed lip breathing techniques or other breathing techniques.       [place]  Term Goals Exhibits compliance with exercise, home  and travel O2 prescription;Verbalizes importance of monitoring SPO2 with pulse oximeter and return demonstration;Maintenance of O2 saturations>88%;Exhibits proper breathing techniques, such as pursed lip breathing or other method taught during program session;Compliance with respiratory medication Exhibits compliance with exercise, home  and travel O2 prescription;Verbalizes importance of monitoring SPO2 with pulse oximeter and return demonstration;Maintenance of O2 saturations>88%;Exhibits proper breathing techniques, such as pursed lip breathing or other method taught during program session;Compliance with respiratory medication      Goals/Expected Outcomes Compliance Compliance               Oxygen Discharge (Final Oxygen Re-Evaluation):  Oxygen Re-Evaluation - 11/04/22 1553       Program Oxygen Prescription   Program Oxygen Prescription Continuous    Liters per minute 4    Comments Pt has been increasing oxygen to 4L when exercising      Home Oxygen   Home Oxygen Device Portable Concentrator;Home Concentrator    Sleep Oxygen Prescription Continuous    Liters per minute 2    Home Exercise Oxygen Prescription Continuous    Liters per minute 2    Home Resting Oxygen Prescription Continuous    Liters per minute 2    Compliance with Home Oxygen Use Yes      Goals/Expected Outcomes   Short Term Goals To learn and exhibit compliance with exercise, home and travel O2  prescription;To learn and understand importance of monitoring SPO2 with pulse oximeter and demonstrate accurate  use of the pulse oximeter.;To learn and understand importance of maintaining oxygen saturations>88%;To learn and demonstrate proper pursed lip breathing techniques or other breathing techniques.     [place]  Term Goals Exhibits compliance with exercise, home  and travel O2 prescription;Verbalizes importance of monitoring SPO2 with pulse oximeter and return demonstration;Maintenance of O2 saturations>88%;Exhibits proper breathing techniques, such as pursed lip breathing or other method taught during program session;Compliance with respiratory medication    Goals/Expected Outcomes Compliance             Initial Exercise Prescription:  Initial Exercise Prescription - 09/12/22 1400       Date of Initial Exercise RX and Referring Provider   Date 09/12/22    Referring Provider Dr. Shearon Stalls    Expected Discharge Date 01/15/23      Oxygen   Oxygen Continuous    Liters 2    Maintain Oxygen Saturation 88% or higher      NuStep   Level 1    SPM 60    Minutes 17      Arm Ergometer   Level 1    RPM 50    Minutes 22      Prescription Details   Frequency (times per week) 2    Duration Progress to 30 minutes of continuous aerobic without signs/symptoms of physical distress      Intensity   THRR 40-80% of Max Heartrate 62-123    Ratings of Perceived Exertion 11-13    Perceived Dyspnea 0-4      Resistance Training   Training Prescription Yes    Weight 3    Reps 10-15             Perform Capillary Blood Glucose checks as needed.  Exercise Prescription Changes:   Exercise Prescription Changes     Row Name 09/23/22 1500 10/07/22 1400 10/09/22 1400 11/04/22 1500 11/18/22 1400     Response to Exercise   Blood Pressure (Admit) 140/60 130/52 158/52 118/48 120/38   Blood Pressure (Exercise) 158/56 152/52 148/50 126/58 124/40   Blood Pressure (Exit) 140/60 132/54 150/56  126/52 120/40   Heart Rate (Admit) 98 bpm 90 bpm 96 bpm 94 bpm 94 bpm   Heart Rate (Exercise) 108 bpm 103 bpm 106 bpm 94 bpm 110 bpm   Heart Rate (Exit) 100 bpm 97 bpm 104 bpm 101 bpm 76 bpm   Oxygen Saturation (Admit) 94 % 92 % 90 % 95 % 91 %   Oxygen Saturation (Exercise) 95 % 90 % 92 % 96 % 94 %   Oxygen Saturation (Exit) 97 % 92 % 94 % 94 % 97 %   Rating of Perceived Exertion (Exercise) '12 13 12 12 12   '$ Perceived Dyspnea (Exercise) '13 13 12 12 12   '$ Duration Continue with 30 min of aerobic exercise without signs/symptoms of physical distress. Continue with 30 min of aerobic exercise without signs/symptoms of physical distress. Continue with 30 min of aerobic exercise without signs/symptoms of physical distress. Continue with 30 min of aerobic exercise without signs/symptoms of physical distress. Continue with 30 min of aerobic exercise without signs/symptoms of physical distress.   Intensity THRR unchanged THRR unchanged THRR unchanged THRR unchanged THRR unchanged     Progression   Progression Continue to progress workloads to maintain intensity without signs/symptoms of physical distress. Continue to progress workloads to maintain intensity without signs/symptoms of physical distress. Continue to progress workloads to maintain intensity without signs/symptoms of physical distress. Continue to progress workloads to maintain intensity  without signs/symptoms of physical distress. Continue to progress workloads to maintain intensity without signs/symptoms of physical distress.     Resistance Training   Training Prescription Yes Yes Yes Yes Yes   Weight '3 3 3 5 5   '$ Reps 10-15 10-15 10-15 10-15 10-15   Time 10 Minutes 10 Minutes 10 Minutes 10 Minutes 10 Minutes     Oxygen   Oxygen Continuous Continuous Continuous Continuous Continuous   Liters '3 4 4 2 5     '$ NuStep   Level '1 1 1 2 3   '$ SPM 68 81 24 69 74   Minutes 39 22 39 39 39   METs 1.7 1.8 1.8 1.7 1.8     Arm Ergometer   Level -- 1  -- -- --   RPM -- 43 -- -- --   Minutes -- 17 -- -- --   METs -- 1.8 -- -- --    Row Name 12/02/22 1400             Response to Exercise   Blood Pressure (Admit) 130/52       Blood Pressure (Exercise) 142/62       Blood Pressure (Exit) 132/78       Heart Rate (Admit) 96 bpm       Heart Rate (Exercise) 117 bpm       Heart Rate (Exit) 105 bpm       Oxygen Saturation (Admit) 96 %       Oxygen Saturation (Exercise) 96 %       Oxygen Saturation (Exit) 97 %       Rating of Perceived Exertion (Exercise) 12       Perceived Dyspnea (Exercise) 12       Duration Continue with 30 min of aerobic exercise without signs/symptoms of physical distress.       Intensity THRR unchanged         Progression   Progression Continue to progress workloads to maintain intensity without signs/symptoms of physical distress.         Resistance Training   Training Prescription Yes       Weight 5       Reps 10-15       Time 10 Minutes         Oxygen   Oxygen Continuous       Liters 4         NuStep   Level 3       SPM 76       Minutes 39       METs 1.8         Home Exercise Plan   Plans to continue exercise at Home (comment)       Frequency Add 3 additional days to program exercise sessions.       Initial Home Exercises Provided 12/02/22                Exercise Comments:   Exercise Comments     Row Name 12/02/22 1410           Exercise Comments home exercise reviewed                Exercise Goals and Review:   Exercise Goals     Row Name 09/12/22 1417 10/07/22 1439 11/04/22 1547 12/02/22 1512       Exercise Goals   Increase Physical Activity Yes Yes Yes Yes    Intervention Provide advice, education, support and counseling about physical  activity/exercise needs.;Develop an individualized exercise prescription for aerobic and resistive training based on initial evaluation findings, risk stratification, comorbidities and participant's personal goals. Provide advice,  education, support and counseling about physical activity/exercise needs.;Develop an individualized exercise prescription for aerobic and resistive training based on initial evaluation findings, risk stratification, comorbidities and participant's personal goals. Provide advice, education, support and counseling about physical activity/exercise needs.;Develop an individualized exercise prescription for aerobic and resistive training based on initial evaluation findings, risk stratification, comorbidities and participant's personal goals. Provide advice, education, support and counseling about physical activity/exercise needs.;Develop an individualized exercise prescription for aerobic and resistive training based on initial evaluation findings, risk stratification, comorbidities and participant's personal goals.    Expected Outcomes Short Term: Attend rehab on a regular basis to increase amount of physical activity.;[place] Term: Add in home exercise to make exercise part of routine and to increase amount of physical activity.;[place] Term: Exercising regularly at least 3-5 days a week. Short Term: Attend rehab on a regular basis to increase amount of physical activity.;[place] Term: Add in home exercise to make exercise part of routine and to increase amount of physical activity.;[place] Term: Exercising regularly at least 3-5 days a week. Short Term: Attend rehab on a regular basis to increase amount of physical activity.;[place] Term: Add in home exercise to make exercise part of routine and to increase amount of physical activity.;[place] Term: Exercising regularly at least 3-5 days a week. Short Term: Attend rehab on a regular basis to increase amount of physical activity.;[place] Term: Add in home exercise to make exercise part of routine and to increase amount of physical activity.;[place] Term: Exercising regularly at least 3-5 days a week.    Increase Strength and Stamina Yes Yes Yes Yes    Intervention Provide advice,  education, support and counseling about physical activity/exercise needs.;Develop an individualized exercise prescription for aerobic and resistive training based on initial evaluation findings, risk stratification, comorbidities and participant's personal goals. Provide advice, education, support and counseling about physical activity/exercise needs.;Develop an individualized exercise prescription for aerobic and resistive training based on initial evaluation findings, risk stratification, comorbidities and participant's personal goals. Provide advice, education, support and counseling about physical activity/exercise needs.;Develop an individualized exercise prescription for aerobic and resistive training based on initial evaluation findings, risk stratification, comorbidities and participant's personal goals. Provide advice, education, support and counseling about physical activity/exercise needs.;Develop an individualized exercise prescription for aerobic and resistive training based on initial evaluation findings, risk stratification, comorbidities and participant's personal goals.    Expected Outcomes Short Term: Increase workloads from initial exercise prescription for resistance, speed, and METs.;Short Term: Perform resistance training exercises routinely during rehab and add in resistance training at home;[place] Term: Improve cardiorespiratory fitness, muscular endurance and strength as measured by increased METs and functional capacity (6MWT) Short Term: Increase workloads from initial exercise prescription for resistance, speed, and METs.;Short Term: Perform resistance training exercises routinely during rehab and add in resistance training at home;Bruni Term: Improve cardiorespiratory fitness, muscular endurance and strength as measured by increased METs and functional capacity (6MWT) Short Term: Increase workloads from initial exercise prescription for resistance, speed, and METs.;Short Term: Perform  resistance training exercises routinely during rehab and add in resistance training at home;[place] Term: Improve cardiorespiratory fitness, muscular endurance and strength as measured by increased METs and functional capacity (6MWT) Short Term: Increase workloads from initial exercise prescription for resistance, speed, and METs.;Short Term: Perform resistance training exercises routinely during rehab and add in resistance training at home;[place] Term: Improve cardiorespiratory fitness, muscular  endurance and strength as measured by increased METs and functional capacity (6MWT)    Able to understand and use rate of perceived exertion (RPE) scale Yes Yes Yes Yes    Intervention Provide education and explanation on how to use RPE scale Provide education and explanation on how to use RPE scale Provide education and explanation on how to use RPE scale Provide education and explanation on how to use RPE scale    Expected Outcomes Short Term: Able to use RPE daily in rehab to express subjective intensity level;[place] Term:  Able to use RPE to guide intensity level when exercising independently -- Short Term: Able to use RPE daily in rehab to express subjective intensity level;[place] Term:  Able to use RPE to guide intensity level when exercising independently Short Term: Able to use RPE daily in rehab to express subjective intensity level;[place] Term:  Able to use RPE to guide intensity level when exercising independently    Able to understand and use Dyspnea scale Yes Yes Yes Yes    Intervention Provide education and explanation on how to use Dyspnea scale Provide education and explanation on how to use Dyspnea scale Provide education and explanation on how to use Dyspnea scale Provide education and explanation on how to use Dyspnea scale    Expected Outcomes [place] Term: Able to use Dyspnea scale to guide intensity level when exercising independently;Short Term: Able to use Dyspnea scale daily in rehab to express subjective  sense of shortness of breath during exertion [place] Term: Able to use Dyspnea scale to guide intensity level when exercising independently;Short Term: Able to use Dyspnea scale daily in rehab to express subjective sense of shortness of breath during exertion [place] Term: Able to use Dyspnea scale to guide intensity level when exercising independently;Short Term: Able to use Dyspnea scale daily in rehab to express subjective sense of shortness of breath during exertion [place] Term: Able to use Dyspnea scale to guide intensity level when exercising independently;Short Term: Able to use Dyspnea scale daily in rehab to express subjective sense of shortness of breath during exertion    Knowledge and understanding of Target Heart Rate Range (THRR) Yes Yes Yes Yes    Intervention Provide education and explanation of THRR including how the numbers were predicted and where they are located for reference Provide education and explanation of THRR including how the numbers were predicted and where they are located for reference Provide education and explanation of THRR including how the numbers were predicted and where they are located for reference Provide education and explanation of THRR including how the numbers were predicted and where they are located for reference    Expected Outcomes Short Term: Able to state/look up THRR;[place] Term: Able to use THRR to govern intensity when exercising independently;Short Term: Able to use daily as guideline for intensity in rehab Short Term: Able to state/look up THRR;[place] Term: Able to use THRR to govern intensity when exercising independently;Short Term: Able to use daily as guideline for intensity in rehab Short Term: Able to state/look up THRR;[place] Term: Able to use THRR to govern intensity when exercising independently;Short Term: Able to use daily as guideline for intensity in rehab Short Term: Able to state/look up THRR;[place] Term: Able to use THRR to govern intensity when exercising  independently;Short Term: Able to use daily as guideline for intensity in rehab    Understanding of Exercise Prescription Yes Yes Yes Yes    Intervention Provide education, explanation, and written materials on patient's individual exercise  prescription Provide education, explanation, and written materials on patient's individual exercise prescription Provide education, explanation, and written materials on patient's individual exercise prescription Provide education, explanation, and written materials on patient's individual exercise prescription    Expected Outcomes Short Term: Able to explain program exercise prescription;[place] Term: Able to explain home exercise prescription to exercise independently Short Term: Able to explain program exercise prescription;[place] Term: Able to explain home exercise prescription to exercise independently Short Term: Able to explain program exercise prescription;[place] Term: Able to explain home exercise prescription to exercise independently Short Term: Able to explain program exercise prescription;[place] Term: Able to explain home exercise prescription to exercise independently             Exercise Goals Re-Evaluation :  Exercise Goals Re-Evaluation     Row Name 10/07/22 1439 11/04/22 1547 12/02/22 1512         Exercise Goal Re-Evaluation   Exercise Goals Review Increase Strength and Stamina;Increase Physical Activity;Able to understand and use Dyspnea scale;Able to understand and use rate of perceived exertion (RPE) scale;Knowledge and understanding of Target Heart Rate Range (THRR);Understanding of Exercise Prescription Increase Physical Activity;Increase Strength and Stamina;Able to understand and use rate of perceived exertion (RPE) scale;Able to understand and use Dyspnea scale;Knowledge and understanding of Target Heart Rate Range (THRR);Understanding of Exercise Prescription Increase Physical Activity;Increase Strength and Stamina;Able to understand and use  rate of perceived exertion (RPE) scale;Able to understand and use Dyspnea scale;Knowledge and understanding of Target Heart Rate Range (THRR);Understanding of Exercise Prescription     Comments Pt has completed 5 sessions of PR. He has wanted to use the treadmill but due to walking with a walker and him being a high fall risk staff have had to tell him that he is not able to. He does seem to get discouraged during exercise due to his SOB and needing to take breaks to bring hos SpO2 up. He is currently exercising at 1.8 METs on the stepper. Will continue to monitor and progress as able. Pt has completed 9 sessions of PR. He has been able to use 2L of oxygen when exercising the past two sessions and not needing to go back up to 4L. He no longer uses the arm ergometer, he states that it hurts his shoulders. He continues to ask to use the treadmill and has to be reminded that he is a fall risk due to using a upright walker. He is currently exercising at 1.7 METs on the stepper. Will continue to monitor and progress as able. Pt has completed 18 sessions of PR. He has been increasing his oxygen to 4L while exercising. He no longer is able to use the arm ergometer due to shoulder pain. Pt has been frustrated that staff will not allow him to use the treadmill. We continuously reinforce that he is a high fall risk and that he uses a high walker, so he would be unable to take the weight off of his hips on the treadmill like he does when he walks with the high walker. His progress has been slow in the program. He is currently exercising at 1.8 METs on the stepper. Will continue to monitor and progess as able.     Expected Outcomes Through exercise at home and at rehab, the patient will meet their stated goals. Through exercise at home and at rehab, the patient will meet their stated goals. Through exercise at home and at rehab, the patient will meet their stated goals.  Discharge Exercise Prescription  (Final Exercise Prescription Changes):  Exercise Prescription Changes - 12/02/22 1400       Response to Exercise   Blood Pressure (Admit) 130/52    Blood Pressure (Exercise) 142/62    Blood Pressure (Exit) 132/78    Heart Rate (Admit) 96 bpm    Heart Rate (Exercise) 117 bpm    Heart Rate (Exit) 105 bpm    Oxygen Saturation (Admit) 96 %    Oxygen Saturation (Exercise) 96 %    Oxygen Saturation (Exit) 97 %    Rating of Perceived Exertion (Exercise) 12    Perceived Dyspnea (Exercise) 12    Duration Continue with 30 min of aerobic exercise without signs/symptoms of physical distress.    Intensity THRR unchanged      Progression   Progression Continue to progress workloads to maintain intensity without signs/symptoms of physical distress.      Resistance Training   Training Prescription Yes    Weight 5    Reps 10-15    Time 10 Minutes      Oxygen   Oxygen Continuous    Liters 4      NuStep   Level 3    SPM 76    Minutes 39    METs 1.8      Home Exercise Plan   Plans to continue exercise at Home (comment)    Frequency Add 3 additional days to program exercise sessions.    Initial Home Exercises Provided 12/02/22             Nutrition:  Target Goals: Understanding of nutrition guidelines, daily intake of sodium '1500mg'$ , cholesterol '200mg'$ , calories 30% from fat and 7% or less from saturated fats, daily to have 5 or more servings of fruits and vegetables.  Biometrics:  Pre Biometrics - 09/12/22 1417       Pre Biometrics   Height '5\' 11"'$  (1.803 m)    Weight 80.4 kg    Waist Circumference 39 inches    Hip Circumference 39 inches    Waist to Hip Ratio 1 %    BMI (Calculated) 24.73    Triceps Skinfold 9 mm    % Body Fat 23.7 %    Grip Strength 40.7 kg    Flexibility 0 in    Single Leg Stand 0 seconds              Nutrition Therapy Plan and Nutrition Goals:  Nutrition Therapy & Goals - 09/12/22 1401       Personal Nutrition Goals   Comments Patient  scored 10 on his diet assessment. His wife cooks and says she trys to cook healthy meals. Handout provided and explained to he and his wife regarding healthier choices. We offer 2 educational sessions on heart healthy nutrition with handouts and assistance with RD referral if patient is interested.      Intervention Plan   Intervention Nutrition handout(s) given to patient.    Expected Outcomes Short Term Goal: Understand basic principles of dietary content, such as calories, fat, sodium, cholesterol and nutrients.             Nutrition Assessments:  Nutrition Assessments - 09/12/22 1401       MEDFICTS Scores   Pre Score 10            MEDIFICTS Score Key: ?70 Need to make dietary changes  40-70 Heart Healthy Diet ? 40 Therapeutic Level Cholesterol Diet   Picture Your Plate Scores: <78 Unhealthy dietary pattern  with much room for improvement. 41-50 Dietary pattern unlikely to meet recommendations for good health and room for improvement. 51-60 More healthful dietary pattern, with some room for improvement.  >60 Healthy dietary pattern, although there may be some specific behaviors that could be improved.    Nutrition Goals Re-Evaluation:   Nutrition Goals Discharge (Final Nutrition Goals Re-Evaluation):   Psychosocial: Target Goals: Acknowledge presence or absence of significant depression and/or stress, maximize coping skills, provide positive support system. Participant is able to verbalize types and ability to use techniques and skills needed for reducing stress and depression.  Initial Review & Psychosocial Screening:  Initial Psych Review & Screening - 09/12/22 1426       Initial Review   Current issues with Current Anxiety/Panic      Family Dynamics   Good Support System? Yes      Barriers   Psychosocial barriers to participate in program Psychosocial barriers identified (see note)      Screening Interventions   Interventions Encouraged to  exercise;Provide feedback about the scores to participant    Expected Outcomes Short Term goal: Identification and review with participant of any Quality of Life or Depression concerns found by scoring the questionnaire.             Quality of Life Scores:  Quality of Life - 09/12/22 1413       Quality of Life   Select Quality of Life      Quality of Life Scores   Health/Function Pre 6.72 %    Socioeconomic Pre 20.2 %    Psych/Spiritual Pre 7.79 %    Family Pre 27.1 %    GLOBAL Pre 12.08 %            Scores of 19 and below usually indicate a poorer quality of life in these areas.  A difference of  2-3 points is a clinically meaningful difference.  A difference of 2-3 points in the total score of the Quality of Life Index has been associated with significant improvement in overall quality of life, self-image, physical symptoms, and general health in studies assessing change in quality of life.   PHQ-9: Review Flowsheet  More data exists      11/13/2022 09/12/2022 11/12/2021 10/23/2020 12/14/2018  Depression screen PHQ 2/9  Decreased Interest 0 0 0 0 3  Down, Depressed, Hopeless 0 0 0 0 3  PHQ - 2 Score 0 0 0 0 6  Altered sleeping - 3 - - 3  Tired, decreased energy - 3 - - 3  Change in appetite - 3 - - 0  Feeling bad or failure about yourself  - 3 - - 2  Trouble concentrating - 0 - - 0  Moving slowly or fidgety/restless - 0 - - 0  Suicidal thoughts - 0 - - 0  PHQ-9 Score - 12 - - 14  Difficult doing work/chores - Extremely dIfficult - - -   Interpretation of Total Score  Total Score Depression Severity:  1-4 = Minimal depression, 5-9 = Mild depression, 10-14 = Moderate depression, 15-19 = Moderately severe depression, 20-27 = Severe depression   Psychosocial Evaluation and Intervention:  Psychosocial Evaluation - 09/12/22 1427       Psychosocial Evaluation & Interventions   Interventions Encouraged to exercise with the program and follow exercise  prescription;Stress management education;Relaxation education    Comments Patient has no psychosocial barriers identified at his orientaiton visit. His wife of 15 years was with him and helped answer questions.  He scored 13 on his PHQ-9 and low QOL socres mainly due to his chronic COPD that limits his ability to do activities. He says he used to be very active and loved to restore cars but now is not able to do hardly anything. He and his wife are from [place] and relocated 4 years ago to [place] to be closer to their daughter. They have 5 children, 2 are deceased and 2 are adopted. They have multiple grandchildren. 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 Patient has no diagnosis of anxiety but reports feeling very anxious and panicky when he gets SOB. His wife feels he needs to be treated for anxiety. I encouraged them to speak with their PCP about this and his wife said she would address with Dr. Shearon Stalls. He also has trouble staying asleep but is not taking anything as a sleep aide. Patient is not sure he can exercise for 34 minutes but he wants to try. He is totally sedentary now and wants to participate in the program to gain some endurance and be able to do more activities with less SOB. He is ready to start the program.    Expected Outcomes Patient will continue to have no psychosocial barriers identified.    Continue Psychosocial Services  No Follow up required             Psychosocial Re-Evaluation:  Psychosocial Re-Evaluation     [place] Name 09/30/22 1331 10/24/22 1130 11/26/22 1442         Psychosocial Re-Evaluation   Current issues with Current Anxiety/Panic Current Anxiety/Panic Current Anxiety/Panic     Comments Patient is new to program completeing 3 sessions.  He was referred to pulmonary rehab by Dr. Shearon Stalls with COPD.  His initial QOL score is 12.08% and his PHQ-9 score is 13. He seems to enjoy coming to the program and demonstartes an interest in  improving his health and  continues to demonstarte a positive out look and attitude.  We will continue to monitor as he progresses in the program. Patient has completed 7 sessions.  He has had several doctors appointment in last couple weeks.  He seems to enjoy coming to the program and demonstartes an interest in  improving his health and continues to demonstarte a positive out look and attitude.  We will continue to monitor as he progresses in the program. Patient has completed 15 sessions.  He does not have any psychosocial barriers.  He seems to enjoy coming to the program and demonstrates and interest in improving his health.  We will continue to monitor his progress.     Expected Outcomes Patient will have no psychosocial issuses identified at discharge. Patient will have no psychosocial issuses identified at discharge. Patient will continue to have no psychosocial barriers identified.     Interventions Stress management education;Encouraged to attend Pulmonary Rehabilitation for the exercise;Relaxation education Stress management education;Encouraged to attend Pulmonary Rehabilitation for the exercise;Relaxation education Stress management education;Relaxation education;Encouraged to attend Pulmonary Rehabilitation for the exercise     Continue Psychosocial Services  No Follow up required No Follow up required No Follow up required              Psychosocial Discharge (Final Psychosocial Re-Evaluation):  Psychosocial Re-Evaluation - 11/26/22 1442       Psychosocial Re-Evaluation   Current issues with Current Anxiety/Panic    Comments Patient has completed 15 sessions.  He does not have any psychosocial barriers.  He seems to enjoy  coming to the program and demonstrates and interest in improving his health.  We will continue to monitor his progress.    Expected Outcomes Patient will continue to have no psychosocial barriers identified.    Interventions Stress management education;Relaxation education;Encouraged to  attend Pulmonary Rehabilitation for the exercise    Continue Psychosocial Services  No Follow up required              Education: Education Goals: Education classes will be provided on a weekly basis, covering required topics. Participant will state understanding/return demonstration of topics presented.  Learning Barriers/Preferences:  Learning Barriers/Preferences - 09/12/22 1404       Learning Barriers/Preferences   Learning Barriers Hearing   Deaf in right ear.   Learning Preferences Written Material;Audio;Pictoral             Education Topics: How Lungs Work and Diseases: - Discuss the anatomy of the lungs and diseases that can affect the lungs, such as COPD. Flowsheet Row PULMONARY REHAB CHRONIC OBSTRUCTIVE PULMONARY DISEASE from 11/27/2022 in [place]  Date 11/06/22  Educator DF  Instruction Review Code 1- Verbalizes Understanding       Exercise: -Discuss the importance of exercise, FITT principles of exercise, normal and abnormal responses to exercise, and how to exercise safely.   Environmental Irritants: -Discuss types of environmental irritants and how to limit exposure to environmental irritants. Flowsheet Row PULMONARY REHAB CHRONIC OBSTRUCTIVE PULMONARY DISEASE from 11/27/2022 in [place]  Date 11/13/22  Educator HB  Instruction Review Code 1- Verbalizes Understanding       Meds/Inhalers and oxygen: - Discuss respiratory medications, definition of an inhaler and oxygen, and the proper way to use an inhaler and oxygen. Flowsheet Row PULMONARY REHAB CHRONIC OBSTRUCTIVE PULMONARY DISEASE from 11/27/2022 in [place]  Date 11/20/22  Educator HB       Energy Saving Techniques: - Discuss methods to conserve energy and decrease shortness of breath when performing activities of daily living.  Flowsheet Row PULMONARY REHAB CHRONIC OBSTRUCTIVE PULMONARY DISEASE from 11/27/2022 in  [place]  Date 11/27/22  Educator HB  Instruction Review Code 1- Verbalizes Understanding       Bronchial Hygiene / Breathing Techniques: - Discuss breathing mechanics, pursed-lip breathing technique,  proper posture, effective ways to clear airways, and other functional breathing techniques   Cleaning Equipment: - Provides group verbal and written instruction about the health risks of elevated stress, cause of high stress, and healthy ways to reduce stress.   Nutrition I: Fats: - Discuss the types of cholesterol, what cholesterol does to the body, and how cholesterol levels can be controlled. Flowsheet Row PULMONARY REHAB CHRONIC OBSTRUCTIVE PULMONARY DISEASE from 11/27/2022 in [place]  Date 09/18/22  Educator handout  Instruction Review Code 1- Verbalizes Understanding       Nutrition II: Labels: -Discuss the different components of food labels and how to read food labels. Flowsheet Row PULMONARY REHAB CHRONIC OBSTRUCTIVE PULMONARY DISEASE from 11/27/2022 in [place]  Date 10/02/22  Educator HB  Instruction Review Code 1- Verbalizes Understanding       Respiratory Infections: - Discuss the signs and symptoms of respiratory infections, ways to prevent respiratory infections, and the importance of seeking medical treatment when having a respiratory infection.   Stress I: Signs and Symptoms: - Discuss the causes of stress, how stress may lead to anxiety and depression, and ways to limit stress.   Stress II: Relaxation: -Discuss  relaxation techniques to limit stress.   Oxygen for Home/Travel: - Discuss how to prepare for travel when on oxygen and proper ways to transport and store oxygen to ensure safety. Flowsheet Row PULMONARY REHAB CHRONIC OBSTRUCTIVE PULMONARY DISEASE from 11/27/2022 in [place]  Date 10/30/22  Educator DF  Instruction Review Code 1- Verbalizes  Understanding       Knowledge Questionnaire Score:  Knowledge Questionnaire Score - 09/12/22 1405       Knowledge Questionnaire Score   Pre Score 4/18             Core Components/Risk Factors/Patient Goals at Admission:  Personal Goals and Risk Factors at Admission - 09/12/22 1424       Core Components/Risk Factors/Patient Goals on Admission    Weight Management Weight Maintenance    Improve shortness of breath with ADL's Yes    Intervention Provide education, individualized exercise plan and daily activity instruction to help decrease symptoms of SOB with activities of daily living.    Expected Outcomes Short Term: Improve cardiorespiratory fitness to achieve a reduction of symptoms when performing ADLs;Nodal Term: Be able to perform more ADLs without symptoms or delay the onset of symptoms    Hypertension Yes    Intervention Provide education on lifestyle modifcations including regular physical activity/exercise, weight management, moderate sodium restriction and increased consumption of fresh fruit, vegetables, and low fat dairy, alcohol moderation, and smoking cessation.;Monitor prescription use compliance.    Expected Outcomes Short Term: Continued assessment and intervention until BP is < 140/57m HG in hypertensive participants. < 130/836mHG in hypertensive participants with diabetes, heart failure or chronic kidney disease.;[place] Term: Maintenance of blood pressure at goal levels.    Personal Goal Other Yes    Personal Goal Patient wants to be able to walk around more with less SOB and improve his endurance.    Intervention Patient will attend PR 2 days/week with exercise and education.    Expected Outcomes Patient will complete the program meeting both personal and program goals.             Core Components/Risk Factors/Patient Goals Review:   Goals and Risk Factor Review     Row Name 09/30/22 1427 10/24/22 1132 11/26/22 1445         Core Components/Risk  Factors/Patient Goals Review   Personal Goals Review Improve shortness of breath with ADL's;Develop more efficient breathing techniques such as purse lipped breathing and diaphragmatic breathing and practicing self-pacing with activity. Improve shortness of breath with ADL's;Develop more efficient breathing techniques such as purse lipped breathing and diaphragmatic breathing and practicing self-pacing with activity. (P)  Weight Management/Obesity;Hypertension;Improve shortness of breath with ADL's;Other     Review Patient is new to the program, completeing sessions # 3.  He is referred to PR with COPD via Dr DeShearon Stalls Patient exercises on 3L of oxygen with O2 stats running between 94%-96% while exercising on the Nu Step. We will help him with develping a efficient breathing techniques such as purse lipped breathing and practice self pacing with increased activity.  Patient is doing well in program with progression and conisitent attendance.  We wiil encourage his progress as he continues to meet these goals.   His goals are to be able to walk with less SOB and  and improve with endurance. Patient is tolerating number 7 session well.  We will help him with develping a efficient breathing techniques such as purse lip breathing and practice self pacing with increased activity.  Patient is doing well in program with progression and conisitent attendance.  We will encourage his progress as he continues to meet these goals, of walking with less SOB and improving endurance.  Plan of care is ongoing and no further concerns as of present. (P)  Pt has completed 15 sessions in the program.  He was referred to PR due to COPD.  Pt is exercising on 2L of 02 via nasal cannula with 02 sats from 91-98%.  His current weight is 78.4 Kg up from 77.9 kg on 10/28/22.  His goals are to be able to walk and have less SOB, and he wants to improve his endurance.  We will continue to monitor his progress toward completing his goals.      Expected Outcomes Patient will complete the program meeting both program and personal goals at discharge. Patient will complete the program meeting both program and personal goals at discharge. (P)  Patient will complete the program meeting both program and personal goals at discharge.              Core Components/Risk Factors/Patient Goals at Discharge (Final Review):   Goals and Risk Factor Review - 11/26/22 1445       Core Components/Risk Factors/Patient Goals Review   Personal Goals Review Weight Management/Obesity;Hypertension;Improve shortness of breath with ADL's;Other    Review Pt has completed 15 sessions in the program.  He was referred to PR due to COPD.  Pt is exercising on 2L of 02 via nasal cannula with 02 sats from 91-98%.  His current weight is 78.4 Kg up from 77.9 kg on 10/28/22.  His goals are to be able to walk and have less SOB, and he wants to improve his endurance.  We will continue to monitor his progress toward completing his goals.    Expected Outcomes Patient will complete the program meeting both program and personal goals at discharge.             ITP Comments:   Comments: ITP REVIEW Pt is making expected progress toward pulmonary rehab goals after completing 18 sessions. Recommend continued exercise, life style modification, education, and utilization of breathing techniques to increase stamina and strength and decrease shortness of breath with exertion.

## 2022-12-04 ENCOUNTER — Encounter (HOSPITAL_COMMUNITY)
Admission: RE | Admit: 2022-12-04 | Discharge: 2022-12-04 | Disposition: A | Discharge status: Home / Self Care | Payer: Medicare Other | Source: Ambulatory Visit | Attending: Internal Medicine | Admitting: Internal Medicine

## 2022-12-04 DIAGNOSIS — J449 Chronic obstructive pulmonary disease, unspecified: Secondary | ICD-10-CM

## 2022-12-04 NOTE — Progress Notes (Signed)
Daily Session Note  Patient Details  Name: Chase Lloyd MRN: [id number] Date of Birth: 09-05-1956 Referring Provider:   Flowsheet Row PULMONARY REHAB COPD ORIENTATION from 09/12/2022 in [place]  Referring Provider Dr. Shearon Stalls       Encounter Date: 12/04/2022  Check In:  Session Check In - 12/04/22 1330       Check-In   Supervising physician immediately available to respond to emergencies [facility] MD immediately available    Physician(s) Dr Dellia Cloud    Location [facility]    Staff Present Leana Roe, BS, Exercise Physiologist;Tyric Rodeheaver BSN, RN;Dalton Sherrie George, MS, ACSM-CEP    Virtual Visit No    Medication changes reported     No    Fall or balance concerns reported    Yes    Comments Patient uses a standing walker for ambulation.    Tobacco Cessation No Change    Warm-up and Cool-down Performed as group-led instruction    Resistance Training Performed Yes    VAD Patient? No    PAD/SET Patient? No      Pain Assessment   Currently in Pain? No/denies    Pain Score 0-No pain    Multiple Pain Sites No             Capillary Blood Glucose: No results found for this or any previous visit (from the past 24 hour(s)).    Social History   Tobacco Use  Smoking Status Former   Packs/day: 1.50   Years: 47.00   Total pack years: 70.50   Types: Cigarettes   Quit date: 06/13/2018   Years since quitting: 4.4  Smokeless Tobacco Never    Goals Met:  Proper associated with RPD/PD & O2 Sat Independence with exercise equipment Using PLB without cueing & demonstrates good technique Exercise tolerated well Queuing for purse lip breathing No report of concerns or symptoms today Strength training completed today  Goals Unmet:  Not Applicable  Comments: check out at 14:30   Dr. Kathie Dike is Medical Director for [facility] Pul[facility].

## 2022-12-09 ENCOUNTER — Encounter (HOSPITAL_COMMUNITY)
Admission: RE | Admit: 2022-12-09 | Discharge: 2022-12-09 | Disposition: A | Discharge status: Home / Self Care | Payer: Medicare Other | Source: Ambulatory Visit | Attending: Internal Medicine | Admitting: Internal Medicine

## 2022-12-09 DIAGNOSIS — J449 Chronic obstructive pulmonary disease, unspecified: Secondary | ICD-10-CM

## 2022-12-10 ENCOUNTER — Encounter: Payer: Self-pay | Admitting: Physician Assistant

## 2022-12-10 ENCOUNTER — Other Ambulatory Visit: Payer: Self-pay | Admitting: Physician Assistant

## 2022-12-10 ENCOUNTER — Ambulatory Visit: Payer: Medicare Other | Attending: Physician Assistant | Admitting: Physician Assistant

## 2022-12-10 VITALS — BP 100/42 | HR 84 | Ht 71.0 in | Wt 173.8 lb

## 2022-12-10 DIAGNOSIS — R000 Tachycardia, unspecified: Secondary | ICD-10-CM | POA: Diagnosis not present

## 2022-12-10 DIAGNOSIS — I495 Sick sinus syndrome: Secondary | ICD-10-CM

## 2022-12-10 DIAGNOSIS — I2510 Atherosclerotic heart disease of native coronary artery without angina pectoris: Secondary | ICD-10-CM

## 2022-12-10 DIAGNOSIS — I5032 Chronic diastolic (congestive) heart failure: Secondary | ICD-10-CM

## 2022-12-10 DIAGNOSIS — I351 Nonrheumatic aortic (valve) insufficiency: Secondary | ICD-10-CM

## 2022-12-10 DIAGNOSIS — R5383 Other fatigue: Secondary | ICD-10-CM

## 2022-12-10 DIAGNOSIS — E785 Hyperlipidemia, unspecified: Secondary | ICD-10-CM | POA: Diagnosis not present

## 2022-12-10 DIAGNOSIS — I472 Ventricular tachycardia, unspecified: Secondary | ICD-10-CM

## 2022-12-10 NOTE — Assessment & Plan Note (Signed)
He is mainly limited by pulmonary disease.  Volume status is currently stable.  His blood pressure is soft.  At this point, I do not think he would be able to tolerate SGLT2 inhibitor or an MRA.  Continue Lasix 20 mg daily.

## 2022-12-10 NOTE — Patient Instructions (Signed)
Medication Instructions:  Your physician recommends that you continue on your current medications as directed. Please refer to the Current Medication list given to you today.  *If you need a refill on your cardiac medications before your next appointment, please call your pharmacy*   Lab Work: 12/31/22 WHEN YOU COME TO SEE THE PHARMACIST, STOP BY THE LAB FOR:  TSH  If you have labs (blood work) drawn today and your tests are completely normal, you will receive your results only by: [place] (if you have MyChart) OR A paper copy in the mail If you have any lab test that is abnormal or we need to change your treatment, we will call you to review the results.   Testing/Procedures: [employer] Cardiac Event Monitor Instructions  Your physician has requested you wear your cardiac event monitor for 30 days, (1-30). [employer] may call or text to confirm a shipping address. The monitor will be sent to a land address via UPS. [employer] will not ship a monitor to a PO BOX. It typically takes 3-5 days to receive your monitor after it has been enrolled. [employer] will assist with USPS tracking if your package is delayed. The telephone number for [employer] is [phone]. Once you have received your monitor, please review the enclosed instructions. Instruction tutorials can also be viewed under help and settings on the enclosed cell phone. Your monitor has already been registered assigning a specific monitor serial # to you.  Billing and Self Pay Discount Information  [employer] has been provided the insurance information we had on file for you.  If your insurance has been updated, please call [employer] at [phone] to provide them with your updated insurance information.   [employer] offers a discounted Self Pay option for patients who have insurance that does not cover their cardiac event monitor or patients without insurance.  The discounted cost of a Self Pay Cardiac Event  Monitor would be $225.00 , if the patient contacts [employer] at [phone] within 7 days of applying the monitor to make payment arrangements.  If the patient does not contact [employer] within 7 days of applying the monitor, the cost of the cardiac event monitor will be $350.00.  Applying the monitor  Remove cell phone from case and turn it on. The cell phone works as Dealer and needs to be within [employer] of you at all times. The cell phone will need to be charged on a daily basis. We recommend you plug the cell phone into the enclosed charger at your bedside table every night.  Monitor batteries: You will receive two monitor batteries labelled #1 and #2. These are your recorders. Plug battery #2 onto the second connection on the enclosed charger. Keep one battery on the charger at all times. This will keep the monitor battery deactivated. It will also keep it fully charged for when you need to switch your monitor batteries. A small light will be blinking on the battery emblem when it is charging. The light on the battery emblem will remain on when the battery is fully charged.  Open package of a Monitor strip. Insert battery #1 into black hood on strip and gently squeeze monitor battery onto connection as indicated in instruction booklet. Set aside while preparing skin.  Choose location for your strip, vertical or horizontal, as indicated in the instruction booklet. Shave to remove all hair from location. There cannot be any lotions, oils, powders, or colognes on skin where monitor is to be applied. Wipe skin clean with  enclosed Saline wipe. Dry skin completely.  Peel paper labeled #1 off the back of the Monitor strip exposing the adhesive. Place the monitor on the chest in the vertical or horizontal position shown in the instruction booklet. One arrow on the monitor strip must be pointing upward. Carefully remove paper labeled #2, attaching remainder of strip to your skin. Try  not to create any folds or wrinkles in the strip as you apply it.  Firmly press and release the circle in the center of the monitor battery. You will hear a small beep. This is turning the monitor battery on. The heart emblem on the monitor battery will light up every 5 seconds if the monitor battery in turned on and connected to the patient securely. Do not push and hold the circle down as this turns the monitor battery off. The cell phone will locate the monitor battery. A screen will appear on the cell phone checking the connection of your monitor strip. This may read poor connection initially but change to good connection within the next minute. Once your monitor accepts the connection you will hear a series of 3 beeps followed by a climbing crescendo of beeps. A screen will appear on the cell phone showing the two monitor strip placement options. Touch the picture that demonstrates where you applied the monitor strip.  Your monitor strip and battery are waterproof. You are able to shower, bathe, or swim with the monitor on. They just ask you do not submerge deeper than 3 feet underwater. We recommend removing the monitor if you are swimming in a lake, river, or ocean.  Your monitor battery will need to be switched to a fully charged monitor battery approximately once a week. The cell phone will alert you of an action which needs to be made.  On the cell phone, tap for details to reveal connection status, monitor battery status, and cell phone battery status. The green dots indicates your monitor is in good status. A red dot indicates there is something that needs your attention.  To record a symptom, click the circle on the monitor battery. In 30-60 seconds a list of symptoms will appear on the cell phone. Select your symptom and tap save. Your monitor will record a sustained or significant arrhythmia regardless of you clicking the button. Some patients do not feel the heart rhythm  irregularities. [employer] will notify us of any serious or critical events.  Refer to instruction booklet for instructions on switching batteries, changing strips, the Do not disturb or Pause features, or any additional questions.  Call [employer] at [phone], to confirm your monitor is transmitting and record your baseline. They will answer any questions you may have regarding the monitor instructions at that time.  Returning the monitor to [place] all equipment back into blue box. Peel off strip of paper to expose adhesive and close box securely. There is a prepaid UPS shipping label on this box. Drop in a UPS drop box, or at a UPS facility like [employer]. You may also contact [employer] to arrange UPS to pick up monitor package at your home.    Follow-Up: At [facility], you and your health needs are our priority.  As part of our continuing mission to provide you with exceptional heart care, we have created designated Provider Care Teams.  These Care Teams include your primary Cardiologist (physician) and Advanced Practice Providers (APPs -  Physician Assistants and Nurse Practitioners) who all work together to provide you with  the care you need, when you need it.  We recommend signing up for the patient portal called "MyChart".  Sign up information is provided on this After Visit Summary.  MyChart is used to connect with patients for Virtual Visits (Telemedicine).  Patients are able to view lab/test results, encounter notes, upcoming appointments, etc.  Non-urgent messages can be sent to your provider as well.   To learn more about what you can do with MyChart, go to [email or web address].    Your next appointment:   6 month(s)  Provider:   Gwyndolyn Kaufman, MD     Other Instructions

## 2022-12-10 NOTE — Assessment & Plan Note (Signed)
Recent LDL above goal at 132.  Dr. [name] referred him to the lipid clinic for consideration of alternative therapies to reduce his cholesterol.  He has an appointment later this month.

## 2022-12-10 NOTE — Assessment & Plan Note (Signed)
Moderate non-obstructive CAD by cath in 2020.  He has not had chest pain to suggest angina.  Continue aspirin 81 mg, Crestor 10 mg.  In light of Dr. Thompson Caul retirement, follow-up with Dr. Johney Frame in 6 months.

## 2022-12-10 NOTE — Assessment & Plan Note (Signed)
Moderate by recent echocardiogram.  He will need another echocardiogram for surveillance in the next 1 to 2 years.

## 2022-12-10 NOTE — Progress Notes (Addendum)
Cardiology Office Note:    Date:  12/10/2022   ID:  Chase Lloyd, DOB 22-Feb-1956, MRN [id number]  PCP:  [name], NP  [facility] Cardiologist:  Sinclair Grooms, MD (Inactive)     Referring MD: [name], NP   Patient Profile: Coronary artery disease  Severe Ca2+ by CCTA CCTA 04/23/2019: Right lung nodule, aortic atherosclerosis, emphysema; CAC score 1197 (95th percentile), severe CAD; FFR LM 0.75, LAD 0.72, LCx 0.72 >> Cath LHC 04/29/2019: RCA proximal 30, LM ostial 62, LAD proximal 20, D2 13, RPDA 80, LCx mid 10, normal EF >> Med Rx (HFpEF) heart failure with preserved ejection fraction  Managed for CHF in setting of resp failure due to AECOPD and exudative pleural effusion in 10/2018 TTE 01/12/2019: EF 55-60, mild LVH, GR 1 DD, normal RVSF, moderate AI TTE 11/17/22: EF 55-60, no RWMA, Gr 1 DD, NL RVSF, mild MR, mod AI, small circumferential effusion Moderate aortic insufficiency  Hypertension  Hyperlipidemia  Severe emphysema  Hodgkin's disease    History of Present Illness:   Chase Lloyd is a 67 y.o. male with the above problem list.  He was seen by Dr. [name] 10/16/2022.  He had stopped his medications.  He was started back on Crestor and aspirin.  He had lower extremity edema.  Follow-up echocardiogram was obtained to rule out cor pulmonale.  Echocardiogram demonstrated normal EF, grade 1 diastolic dysfunction and normal RV systolic function.  TR signal was inadequate for assessing PA pressure.  There was a small pericardial effusion present. He returns for f/u on CAD, CHF. He is here with his wife. We discussed the results of his echocardiogram. He and his wife note that he was to f/u for his echocardiogram on 2/28 and this appt was set up by pulmonary rehab. He was not allowed to exercise a couple of weeks ago on 2 occasions because of a fast HR (100-120s). Unfortunately, there are no notes in his chart in reference to this. He did not have symptoms  with his tachycardia. He has not had chest pain, palpitations, syncope. He has chronic shortness of breath. His appt on 2/28 is with the PharmD clinic for hyperlipidemia.     Reviewed and updated this encounter:  Tobacco  Allergies  Meds  Problems  Med Hx  Surg Hx  Fam Hx     Review of Systems  Gastrointestinal:  Negative for hematochezia and melena.  Genitourinary:  Negative for hematuria.    Labs/Other Test Reviewed:   Recent Labs: 11/11/2022: ALT 17   Recent Lipid Panel Recent Labs    11/11/22 0900  CHOL 194  TRIG 95  HDL 43  VLDL 19  LDLCALC 132*     Risk Assessment/Calculations/Metrics:             Physical Exam:   VS:  BP (!) 100/42   Pulse 84   Ht '5\' 11"'$  (1.803 m)   Wt 173 lb 12.8 oz (78.8 kg)   SpO2 92%   BMI 24.24 kg/m    Wt Readings from Last 3 Encounters:  12/10/22 173 lb 12.8 oz (78.8 kg)  12/02/22 172 lb 9.9 oz (78.3 kg)  11/18/22 170 lb 10.2 oz (77.4 kg)    Constitutional:      Appearance: Not in distress. Chronically ill-appearing.  Pulmonary:     Breath sounds: No wheezing. No rales.  Cardiovascular:     Normal rate. Regular rhythm.     Murmurs: There is no murmur.  Edema:    Peripheral edema absent.  Skin:    General: Skin is warm and dry.          ASSESSMENT & PLAN:   Tachycardia He was not allowed to exercise one week at pulmonary rehab for elevated HRs. He did not really have symptoms with this. I cannot find any documentation in the chart. However, he notes that he did not have an EKG or rhythm strip at that time. He has had more normal HRs since and has been allowed to exercise at [facility] rehab this past week. Given his lung disease, he may have had multifocal atrial tachycardia. It is possible he also had atrial fibrillation. I will arrange a 30 day event monitor to screen for atrial fibrillation, MAT, etc. Obtain a TSH to r/o thyroid disease.   CAD (coronary artery disease) Moderate non-obstructive CAD by cath in 2020.  He has not  had chest pain to suggest angina.  Continue aspirin 81 mg, Crestor 10 mg.  In light of Dr. Thompson Caul retirement, follow-up with Dr. Johney Frame in 6 months.  Moderate aortic insufficiency Moderate by recent echocardiogram.  He will need another echocardiogram for surveillance in the next 1 to 2 years.  Chronic heart failure with preserved ejection fraction (HFpEF) ([place]) He is mainly limited by pulmonary disease.  Volume status is currently stable.  His blood pressure is soft.  At this point, I do not think he would be able to tolerate SGLT2 inhibitor or an MRA.  Continue Lasix 20 mg daily.  Hyperlipidemia LDL goal <70 Recent LDL above goal at 132.  Dr. [name] referred him to the lipid clinic for consideration of alternative therapies to reduce his cholesterol.  He has an appointment later this month.          Dispo:  Return in about 6 months (around 06/10/2023) for Routine Follow Up, w/ Dr. Johney Frame.   Signed, Richardson Dopp, PA-C  12/10/2022 4:50 PM    [facility], [place], [place]  [address] Phone: [phone]; Fax: [phone]

## 2022-12-10 NOTE — Assessment & Plan Note (Signed)
He was not allowed to exercise one week at pulmonary rehab for elevated HRs. He did not really have symptoms with this. I cannot find any documentation in the chart. However, he notes that he did not have an EKG or rhythm strip at that time. He has had more normal HRs since and has been allowed to exercise at [facility] rehab this past week. Given his lung disease, he may have had multifocal atrial tachycardia. It is possible he also had atrial fibrillation. I will arrange a 30 day event monitor to screen for atrial fibrillation, MAT, etc. Obtain a TSH to r/o thyroid disease.

## 2022-12-11 ENCOUNTER — Encounter (HOSPITAL_COMMUNITY): Payer: Medicare Other

## 2022-12-11 ENCOUNTER — Telehealth: Payer: Self-pay | Admitting: Internal Medicine

## 2022-12-11 NOTE — Telephone Encounter (Signed)
Do you want cardiac and pulmonary clearance on this patient before his colonoscopy is scheduled?

## 2022-12-11 NOTE — Telephone Encounter (Signed)
Inbound call from pt , to reschedule appt from 10/23/22 he had  at the Hospital(Endo) Please advise

## 2022-12-12 ENCOUNTER — Telehealth: Payer: Self-pay

## 2022-12-12 NOTE — Telephone Encounter (Signed)
[facility] Pre-operative Risk Assessment     Request for surgical clearance:     Endoscopy Procedure  What type of surgery is being performed?     colonoscopy  When is this surgery scheduled?     01/15/23  What type of clearance is required ?   medical  Are there any medications that need to be held prior to surgery and how [place]? no  Practice name and name of physician performing surgery?      [facility] Dr Sharyn Creamer, MD  What is your office phone and fax number?      Phone- [phone]  [phone]  Anesthesia type (None, local, MAC, general) ?       MAC

## 2022-12-12 NOTE — Telephone Encounter (Signed)
Good Morning Chase Lloyd,  Chase Lloyd was seen recently by you in office on 12/10/2022 and is scheduled to have a colonoscopy procedure on 01/15/2023.  I see that he is wearing an event monitor due to atrial arrhythmia.  Can you offer any guidance on clearing him for the upcoming procedure and do you recommend waiting for results of event monitor prior to giving clearance.  Thank you for your time and looking forward to your response.  Ambrose Pancoast, NP

## 2022-12-12 NOTE — Telephone Encounter (Signed)
Pre-operative Risk Assessment     Request for surgical clearance:     Endoscopy Procedure  What type of surgery is being performed?     Colonoscopy   When is this surgery scheduled?     01/15/23  What type of clearance is required ?   Medical  Are there any medications that need to be held prior to surgery and how [place]? No - medical clearance only  Practice name and name of physician performing surgery?      [facility] Dr Sharyn Creamer  What is your office phone and fax number?      Phone- [phone]  [phone]  Anesthesia type (None, local, MAC, general) ?       MAC   Please route back to sender

## 2022-12-12 NOTE — Telephone Encounter (Signed)
Pre-op evaluation completed on 10/30/22 with intermediate risk for pulmonary post-op complications due to recent infection. Risk reduced if procedure post poned after 11/30/22. Procedure scheduled for March. Ok to proceed from pulmonary standpoint.  Patient is scheduled for follow-up with me on 2/19. So can add additional evaluation at that time point if desired.

## 2022-12-12 NOTE — Telephone Encounter (Signed)
Date tentatively planned is 01/15/23 if cleared by Cardiology and Pulmonology. Risk assessment requested.

## 2022-12-12 NOTE — Telephone Encounter (Signed)
From a cardiac standpoint, he may proceed with his colonoscopy at acceptable CV risk. He does not need to wait on the results of his monitor. It should be completed by the time he has his colonoscopy.  But, he has severe pulmonary disease. I recommend he also get clearance from his pulmonologist prior to proceeding with his colonoscopy.  Richardson Dopp, PA-C    12/12/2022 11:42 AM

## 2022-12-16 ENCOUNTER — Encounter (HOSPITAL_COMMUNITY)
Admission: RE | Admit: 2022-12-16 | Discharge: 2022-12-16 | Disposition: A | Discharge status: Home / Self Care | Payer: Medicare Other | Source: Ambulatory Visit | Attending: Internal Medicine | Admitting: Internal Medicine

## 2022-12-16 VITALS — Wt 173.5 lb

## 2022-12-16 DIAGNOSIS — J449 Chronic obstructive pulmonary disease, unspecified: Secondary | ICD-10-CM | POA: Diagnosis not present

## 2022-12-16 NOTE — Progress Notes (Signed)
Daily Session Note  Patient Details  Name: Chase Lloyd MRN: [id number] Date of Birth: 1956-02-04 Referring Provider:   Flowsheet Row PULMONARY REHAB COPD ORIENTATION from 09/12/2022 in [facility] [place]  Referring Provider Dr. Shearon Stalls       Encounter Date: 12/16/2022  Check In:  Session Check In - 12/16/22 1330       Check-In   Supervising physician immediately available to respond to emergencies [facility] MD immediately available    Physician(s) Dr Domenic Polite    Location [facility]    Staff Present Leana Roe, BS, Exercise Physiologist;Phyllis Billingsley, RN;Jovontae Banko Hassell Done, RN, BSN;Dalton Sherrie George, MS, ACSM-CEP    Virtual Visit No    Medication changes reported     No    Fall or balance concerns reported    Yes    Comments Patient uses a standing walker for ambulation.    Tobacco Cessation No Change    Warm-up and Cool-down Performed as group-led instruction    Resistance Training Performed Yes    VAD Patient? No    PAD/SET Patient? No      Pain Assessment   Currently in Pain? No/denies    Pain Score 0-No pain    Multiple Pain Sites No             Capillary Blood Glucose: No results found for this or any previous visit (from the past 24 hour(s)).    Social History   Tobacco Use  Smoking Status Former   Packs/day: 1.50   Years: 47.00   Total pack years: 70.50   Types: Cigarettes   Quit date: 06/13/2018   Years since quitting: 4.5  Smokeless Tobacco Never    Goals Met:  Proper associated with RPD/PD & O2 Sat Independence with exercise equipment Using PLB without cueing & demonstrates good technique Exercise tolerated well Queuing for purse lip breathing No report of concerns or symptoms today Strength training completed today  Goals Unmet:  Not Applicable  Comments: Checkout at 1430.   Dr. Kathie Dike is Medical Director for [facility] Pulmonary Rehab.

## 2022-12-18 ENCOUNTER — Encounter (HOSPITAL_COMMUNITY)
Admission: RE | Admit: 2022-12-18 | Discharge: 2022-12-18 | Disposition: A | Discharge status: Home / Self Care | Payer: Medicare Other | Source: Ambulatory Visit | Attending: Internal Medicine | Admitting: Internal Medicine

## 2022-12-18 DIAGNOSIS — J449 Chronic obstructive pulmonary disease, unspecified: Secondary | ICD-10-CM | POA: Diagnosis not present

## 2022-12-18 NOTE — Telephone Encounter (Signed)
   Name: Chase Lloyd  DOB: July 13, 1956  MRN: [id number]   Primary Cardiologist: Sinclair Grooms, MD (Inactive)  Chart reviewed as part of pre-operative protocol coverage. Jaymes Graff Davlin was last seen on 12/10/22 by Richardson Dopp PAC.  Per Kathleen Argue: "From a cardiac standpoint, he may proceed with his colonoscopy at acceptable CV risk. He does not need to wait on the results of his [heart] monitor. It should be completed by the time he has his colonoscopy.   But, he has severe pulmonary disease. I recommend he also get clearance from his pulmonologist prior to proceeding with his colonoscopy."  Therefore, based on ACC/AHA guidelines, the patient would be at acceptable risk for the planned procedure without further cardiovascular testing.    I will route this recommendation to the requesting party via Epic fax function and remove from pre-op pool. Please call with questions.  Tami Lin Amauri Keefe, PA 12/18/2022, 8:35 AM

## 2022-12-18 NOTE — Progress Notes (Signed)
Daily Session Note  Patient Details  Name: Chase Lloyd MRN: [id number] Date of Birth: [birth year] Referring Provider:   Flowsheet Row PULMONARY REHAB COPD ORIENTATION from 09/12/2022 in [place]  Referring Provider Dr. Shearon Stalls       Encounter Date: 12/18/2022  Check In:  Session Check In - 12/18/22 1330       Check-In   Supervising physician immediately available to respond to emergencies [facility] MD immediately available    Physician(s) Dr Domenic Polite    Location [facility]    Staff Present Leana Roe, BS, Exercise Physiologist;Tyler Robidoux Hassell Done, RN, Jennye Moccasin, RN, BSN    Virtual Visit No    Medication changes reported     No    Fall or balance concerns reported    Yes    Comments Patient uses a standing walker for ambulation.    Tobacco Cessation No Change    Warm-up and Cool-down Performed as group-led instruction    Resistance Training Performed Yes    VAD Patient? No    PAD/SET Patient? No      Pain Assessment   Currently in Pain? No/denies    Pain Score 0-No pain    Multiple Pain Sites No             Capillary Blood Glucose: No results found for this or any previous visit (from the past 24 hour(s)).    Social History   Tobacco Use  Smoking Status Former   Packs/day: 1.50   Years: 47.00   Total pack years: 70.50   Types: Cigarettes   Quit date: 06/13/2018   Years since quitting: 4.5  Smokeless Tobacco Never    Goals Met:  Proper associated with RPD/PD & O2 Sat Independence with exercise equipment Using PLB without cueing & demonstrates good technique Exercise tolerated well Queuing for purse lip breathing No report of concerns or symptoms today Strength training completed today  Goals Unmet:  Not Applicable  Comments: Checkout at 1430.   Dr. Kathie Dike is Medical Director for [facility] Pulmonary Rehab.

## 2022-12-19 ENCOUNTER — Ambulatory Visit: Payer: Medicare Other | Attending: Physician Assistant

## 2022-12-19 DIAGNOSIS — R000 Tachycardia, unspecified: Secondary | ICD-10-CM

## 2022-12-22 ENCOUNTER — Ambulatory Visit (INDEPENDENT_AMBULATORY_CARE_PROVIDER_SITE_OTHER): Payer: Medicare Other | Admitting: Pulmonary Disease

## 2022-12-22 ENCOUNTER — Encounter (HOSPITAL_BASED_OUTPATIENT_CLINIC_OR_DEPARTMENT_OTHER): Payer: Self-pay | Admitting: Pulmonary Disease

## 2022-12-22 VITALS — BP 126/60 | HR 82 | Ht 71.0 in | Wt 172.8 lb

## 2022-12-22 DIAGNOSIS — Z01811 Encounter for preprocedural respiratory examination: Secondary | ICD-10-CM | POA: Diagnosis not present

## 2022-12-22 DIAGNOSIS — J439 Emphysema, unspecified: Secondary | ICD-10-CM | POA: Diagnosis not present

## 2022-12-22 DIAGNOSIS — J4489 Other specified chronic obstructive pulmonary disease: Secondary | ICD-10-CM

## 2022-12-22 MED ORDER — ALBUTEROL SULFATE HFA 108 (90 BASE) MCG/ACT IN AERS: 2.0000 | INHALATION_SPRAY | Freq: Four times a day (QID) | RESPIRATORY_TRACT | 6 refills | Status: DC | PRN

## 2022-12-22 MED ORDER — BREZTRI AEROSPHERE 160-9-4.8 MCG/ACT IN AERO: 2.0000 | INHALATION_SPRAY | Freq: Two times a day (BID) | RESPIRATORY_TRACT | 3 refills | Status: DC

## 2022-12-22 NOTE — Progress Notes (Signed)
Chase Lloyd    [id number]    [birth year]  Primary Care Physician:Nafziger, Tommi Rumps, NP Date of Appointment: 12/22/2022 Established Patient Visit  Chief complaint:   Chief Complaint  Patient presents with   Follow-up    Doing better     HPI: Chase Lloyd is a 67 y.o. man with COPD on home oxygen and AVN of the left hip.    Interval Updates: Since his last visit he has been participating in Pulmonary Rehab at [facility]. Initially doing well however beg having increased shortness of breath, requiring nebulizer twice daily. He has also needed to increase his home O2 of 2L to 3L. He is also here for pre-op clearance for EGD/colonoscopy.  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  Used to live in [place] 70+ pack year smoking history, quit 2019  Past Medical History:  Diagnosis Date   Arthritis    Back pain    COPD (chronic obstructive pulmonary disease) (HCC)    DDD (degenerative disc disease), lumbar    Depression    Emphysema of lung ([place])    Empyema ([place])    2016  (was in [place])   GERD (gastroesophageal reflux disease)    Hearing loss of right ear    started 2001.     microphone in right ear ,  and hearing aid in left   Hodgkin's disease ([place]) 1999   Hypotension    Meniere's disease    Pneumonia    Tinnitus     Past Surgical History:  Procedure Laterality Date   BACK SURGERY     CERVICAL FUSION  2016   ELBOW SURGERY     EYE SURGERY     IR THORACENTESIS ASP PLEURAL SPACE W/IMG GUIDE  10/08/2018   LAMINECTOMY  1999   lower back surgery     RIGHT/LEFT HEART CATH AND CORONARY ANGIOGRAPHY N/A 04/29/2019   Procedure: RIGHT/LEFT HEART CATH AND CORONARY ANGIOGRAPHY;  Surgeon: Troy Sine, MD;  Location: [place] [facility];  Service: Cardiovascular;  Laterality: N/A;   TONSILLECTOMY      Family History  Problem Relation Age of Onset   Hypertension Father    Diabetes Father    Testicular cancer Brother    Melanoma Brother     Social History   Occupational History   Occupation: Retired  Tobacco Use   Smoking status: Former    Packs/day: 1.50    Years: 47.00    Total pack years: 70.50    Types: Cigarettes    Quit date: 06/13/2018    Years since quitting: 4.5   Smokeless tobacco: Never  Vaping Use   Vaping Use: Never used  Substance and Sexual Activity   Alcohol use: Yes    Comment: 24 cans in a week   Drug use: No   Sexual activity: Not on file   Physical Exam: Blood pressure 126/60, pulse 82, height '5\' 11"'$  (1.803 m), weight 172 lb 13.5 oz (78.4 kg), SpO2 93 %.  Physical Exam: General: Chronically ill-appearing, no acute distress HENT: [place], AT Eyes: EOMI, no scleral icterus Respiratory: Diminished breath sounds to auscultation bilaterally.  No crackles, wheezing or rales Cardiovascular: RRR, -M/R/G, no JVD Extremities:-Edema,-tenderness Neuro: AAO  x4, CNII-XII grossly intact Psych: Normal mood, normal affect  Data Reviewed: Imaging: CT Chest Lung Screen Cancer 11/12/21 - biapical pleuroparenchymal scarring. Bullous centrilobular and paraseptal emphysema with upper lobe predominance. Post- radiation in right upper lobe, right fibrothorax CT Chest Lung Screen 11/11/22 - patchy consolidation in lingular LLL. Unchanged biapical pleuroparenchymal scarring, bullous emphysema. Post-radiation in RUL, right fibrothorax increased in size.   PFTs:      Latest Ref Rng & Units 08/28/2022    3:47 PM 10/22/2018    1:59 PM 07/30/2017   11:53 AM  PFT Results  FVC-Pre L 1.90  2.42  2.45   FVC-Predicted Pre % 40  50  50   FVC-Post L 2.11  2.87  2.90   FVC-Predicted Post % 45  59  59   Pre FEV1/FVC % % 41  46  46   Post FEV1/FCV % % 45  44  48   FEV1-Pre L 0.79  1.10  1.13    FEV1-Predicted Pre % '22  30  31   '$ FEV1-Post L 0.94  1.26  1.39   DLCO uncorrected ml/min/mmHg 6.91  12.79  13.19   DLCO UNC% % 25  37  39   DLCO corrected ml/min/mmHg 6.91  14.51  14.16   DLCO COR %Predicted % 25  43  42   DLVA Predicted % 41  67  59   TLC L 4.65  5.86    TLC % Predicted % 64  81    RV % Predicted % 90  129     Very severe emphysema, with an FEV1 of 22% of predicted, post FEV1 27% on 08/28/22.  These results are consistent when trended over 2018 in 2019.  Labs:  Lab Results  Component Value Date   WBC 8.3 11/22/2021   HGB 13.5 11/22/2021   HCT 40.1 11/22/2021   MCV 92.4 11/22/2021   PLT 166.0 11/22/2021   Lab Results  Component Value Date   NA 137 11/22/2021   K 3.6 11/22/2021   CL 96 11/22/2021   CO2 36 (H) 11/22/2021   A1AT within normal range.  Elevated CO2 and ABG with hypercapnia  Immunization status: Immunization History  Administered Date(s) Administered   Fluad Quad(high Dose 65+) 09/02/2019   Influenza,inj,Quad PF,6+ Mos 12/27/2015, 10/11/2018   Pneumococcal Conjugate-13 12/26/2015   Td 01/01/2014   Zoster, Live 08/04/2015    Assessment:  Very Severe Emphysema FEV1 27% of predicted COPD exacerbation 2/2 pneumonia - resolved Chronic Respiratory Failure (with hypoxemia and hypercapnia) on increased 4LNC, baseline 3L Avascular necrosis, left hip Hodgkin's disease status post mantle field radiation in 1999, now in remission  Progressive COPD BODE 5 with FEV1 22% on 08/2022 PFTs  Plan/Recommendations: Continue Breztri TWO puffs in the morning and evening Continue Albuterol as needed for shortness of breath and wheezing Continue Pulmonary Rehab Refer to [facility] to consider transplant evaluation OK to proceed with EGD/colonoscopy per GI from pulmonary standpoint. Please contact their office for procedure location and information. Ambulatory O2 walk in-clinic. With exercise increase O2 to 4L via [place] for goal SpO2  88%  Peri-operative Assessment of Pulmonary Risk for Non-Thoracic Surgery(EGD/colonoscopy):  For Mr. Singleton, risk of perioperative pulmonary complications is increased by:  Age greater than 46 years  COPD   Calculated ARISCAT 11 points: Low risk for post-procedure pulmonary complications.  Respiratory complications generally occur in 1% of ASA Class I patients, 5% of ASA Class II and 10% of ASA Class III-IV patients These complications rarely  result in mortality and include postoperative pneumonia, atelectasis, pulmonary embolism, ARDS and increased time requiring postoperative mechanical ventilation.  Overall, I recommend proceeding with the surgery once his acute respiratory illness resolves. Ok to proceed if the risk for respiratory complications are outweighed by the potential benefits. This will need to be discussed between the patient and surgeon.  To reduce risks of respiratory complications, I recommend: --Pre- and post-operative incentive spirometry performed frequently while awake --Avoiding use of pancuronium during anesthesia.  I have discussed the risk factors and recommendations above with the patient.  Return to Care: Return in about 3 months (around 03/22/2023).  I have spent a total time of 38-minutes on the day of the appointment including chart review, data review, collecting history, coordinating care and discussing medical diagnosis and plan with the patient/family. Past medical history, allergies, medications were reviewed. Pertinent imaging, labs and tests included in this note have been reviewed and interpreted independently by me.  Rodman Pickle, MD Pulmonary and [place]

## 2022-12-22 NOTE — Patient Instructions (Addendum)
Very Severe Emphysema  COPD exacerbation 2/2 pneumonia - resolved Continue Breztri TWO puffs in the morning and evening Continue Albuterol as needed for shortness of breath and wheezing Continue Pulmonary Rehab Refer to [facility] to consider transplant evaluation OK to proceed with EGD/colonoscopy per GI from pulmonary standpoint. Please contact their office for procedure location and information. Ambulatory O2 walk in-clinic. With exercise increase O2 to 4L via [place]  Follow-up with me in 3 months

## 2022-12-23 ENCOUNTER — Encounter (HOSPITAL_COMMUNITY)
Admission: RE | Admit: 2022-12-23 | Discharge: 2022-12-23 | Disposition: A | Discharge status: Home / Self Care | Payer: Medicare Other | Source: Ambulatory Visit | Attending: Internal Medicine | Admitting: Internal Medicine

## 2022-12-23 DIAGNOSIS — J449 Chronic obstructive pulmonary disease, unspecified: Secondary | ICD-10-CM | POA: Diagnosis not present

## 2022-12-23 NOTE — Progress Notes (Signed)
Daily Session Note  Patient Details  Name: Chase Lloyd MRN: [id number] Date of Birth: 1956/07/16 Referring Provider:   Flowsheet Row PULMONARY REHAB COPD ORIENTATION from 09/12/2022 in [place]  Referring Provider Dr. Shearon Stalls       Encounter Date: 12/23/2022  Check In:  Session Check In - 12/23/22 1330       Check-In   Supervising physician immediately available to respond to emergencies [facility] MD immediately available    Physician(s) Dr. Dellia Cloud    Location [facility]    Staff Present Leana Roe, BS, Exercise Physiologist;Dalton Sherrie George, MS, ACSM-CEP;Geanie Cooley, RN    Virtual Visit No    Medication changes reported     No    Fall or balance concerns reported    Yes    Comments Patient uses a standing walker for ambulation.    Tobacco Cessation No Change    Warm-up and Cool-down Performed as group-led instruction    Resistance Training Performed Yes    VAD Patient? No    PAD/SET Patient? No      Pain Assessment   Currently in Pain? No/denies    Pain Score 5     Pain Location Back    Pain Orientation Lower    Pain Descriptors / Indicators Aching    Pain Type Chronic pain    Multiple Pain Sites No             Capillary Blood Glucose: No results found for this or any previous visit (from the past 24 hour(s)).    Social History   Tobacco Use  Smoking Status Former   Packs/day: 1.50   Years: 47.00   Total pack years: 70.50   Types: Cigarettes   Quit date: 06/13/2018   Years since quitting: 4.5  Smokeless Tobacco Never    Goals Met:  Proper associated with RPD/PD & O2 Sat Independence with exercise equipment Exercise tolerated well No report of concerns or symptoms today  Goals Unmet:  Not Applicable  Comments: check ou t@ 2:30pm   Dr. Kathie Dike is Medical Director for [facility].

## 2022-12-24 ENCOUNTER — Other Ambulatory Visit: Payer: Self-pay

## 2022-12-24 DIAGNOSIS — R1310 Dysphagia, unspecified: Secondary | ICD-10-CM

## 2022-12-24 DIAGNOSIS — Z1211 Encounter for screening for malignant neoplasm of colon: Secondary | ICD-10-CM

## 2022-12-24 NOTE — Telephone Encounter (Signed)
Patient scheduled for 01/15/23 at 10:45 am. Confirmed with patient. Instructions mailed. Referral placed.

## 2022-12-25 ENCOUNTER — Encounter (HOSPITAL_COMMUNITY)
Admission: RE | Admit: 2022-12-25 | Discharge: 2022-12-25 | Disposition: A | Discharge status: Home / Self Care | Payer: Medicare Other | Source: Ambulatory Visit | Attending: Internal Medicine | Admitting: Internal Medicine

## 2022-12-25 DIAGNOSIS — J449 Chronic obstructive pulmonary disease, unspecified: Secondary | ICD-10-CM | POA: Diagnosis not present

## 2022-12-25 NOTE — Progress Notes (Signed)
Daily Session Note  Patient Details  Name: Chase Lloyd MRN: [id number] Date of Birth: 10/06/1956 Referring Provider:   Flowsheet Row PULMONARY REHAB COPD ORIENTATION from 09/12/2022 in [place]  Referring Provider Dr. Shearon Stalls       Encounter Date: 12/25/2022  Check In:  Session Check In - 12/25/22 1330       Check-In   Supervising physician immediately available to respond to emergencies [facility] MD immediately available    Physician(s) Dr. Dellia Cloud    Location [facility]    Staff Present Leana Roe, BS, Exercise Physiologist;Dalton Sherrie George, MS, ACSM-CEP;[employer] BSN, RN;Debra Wynetta Emery, RN, BSN    Virtual Visit No    Medication changes reported     No    Fall or balance concerns reported    Yes    Comments Patient uses a standing walker for ambulation.    Tobacco Cessation No Change    Warm-up and Cool-down Performed as group-led instruction    Resistance Training Performed Yes    VAD Patient? No    PAD/SET Patient? No      Pain Assessment   Currently in Pain? No/denies    Pain Score 0-No pain    Multiple Pain Sites No             Capillary Blood Glucose: No results found for this or any previous visit (from the past 24 hour(s)).    Social History   Tobacco Use  Smoking Status Former   Packs/day: 1.50   Years: 47.00   Total pack years: 70.50   Types: Cigarettes   Quit date: 06/13/2018   Years since quitting: 4.5  Smokeless Tobacco Never    Goals Met:  Proper associated with RPD/PD & O2 Sat Independence with exercise equipment Using PLB without cueing & demonstrates good technique Exercise tolerated well Queuing for purse lip breathing No report of concerns or symptoms today Strength training completed today  Goals Unmet:  Not Applicable  Comments: check out at 14:30   Dr. Kathie Dike is Medical Director for [facility].

## 2022-12-30 ENCOUNTER — Encounter (HOSPITAL_COMMUNITY)
Admission: RE | Admit: 2022-12-30 | Discharge: 2022-12-30 | Disposition: A | Discharge status: Home / Self Care | Payer: Medicare Other | Source: Ambulatory Visit | Attending: Internal Medicine | Admitting: Internal Medicine

## 2022-12-30 ENCOUNTER — Telehealth: Payer: Self-pay | Admitting: Pulmonary Disease

## 2022-12-30 VITALS — Wt 173.5 lb

## 2022-12-30 DIAGNOSIS — J449 Chronic obstructive pulmonary disease, unspecified: Secondary | ICD-10-CM

## 2022-12-30 NOTE — Telephone Encounter (Signed)
Dr. Shearon Stalls,  Patient has requested to switch to [place] location. Are you ok with this?  JE

## 2022-12-30 NOTE — Progress Notes (Signed)
Daily Session Note  Patient Details  Name: Chase Lloyd MRN: [id number] Date of Birth: 06/17/1956 Referring Provider:   Flowsheet Row PULMONARY REHAB COPD ORIENTATION from 09/12/2022 in [place]  Referring Provider Dr. Shearon Stalls       Encounter Date: 12/30/2022  Check In:  Session Check In - 12/30/22 1329       Check-In   Supervising physician immediately available to respond to emergencies [facility] MD immediately available    Physician(s) Dr Harl Bowie    Location [facility]    Staff Present Leana Roe, BS, Exercise Physiologist;Phyllis Billingsley, RN;Manjinder Breau Hassell Done, RN, BSN    Virtual Visit No    Medication changes reported     No    Fall or balance concerns reported    Yes    Comments Patient uses a standing walker for ambulation.    Tobacco Cessation No Change    Warm-up and Cool-down Performed as group-led instruction    Resistance Training Performed Yes    VAD Patient? No    PAD/SET Patient? No      Pain Assessment   Currently in Pain? No/denies    Pain Score 0-No pain    Multiple Pain Sites No             Capillary Blood Glucose: No results found for this or any previous visit (from the past 24 hour(s)).    Social History   Tobacco Use  Smoking Status Former   Packs/day: 1.50   Years: 47.00   Total pack years: 70.50   Types: Cigarettes   Quit date: 06/13/2018   Years since quitting: 4.5  Smokeless Tobacco Never    Goals Met:  Proper associated with RPD/PD & O2 Sat Independence with exercise equipment Using PLB without cueing & demonstrates good technique Exercise tolerated well Queuing for purse lip breathing No report of concerns or symptoms today Strength training completed today  Goals Unmet:  Not Applicable  Comments: Checkout at 1430.   Dr. Kathie Dike is Medical Director for [facility].

## 2022-12-31 ENCOUNTER — Telehealth: Payer: Self-pay | Admitting: *Deleted

## 2022-12-31 ENCOUNTER — Ambulatory Visit: Payer: Medicare Other

## 2022-12-31 ENCOUNTER — Ambulatory Visit: Payer: Medicare Other | Attending: Interventional Cardiology | Admitting: Pharmacist

## 2022-12-31 DIAGNOSIS — E785 Hyperlipidemia, unspecified: Secondary | ICD-10-CM | POA: Diagnosis not present

## 2022-12-31 DIAGNOSIS — R200 Anesthesia of skin: Secondary | ICD-10-CM

## 2022-12-31 DIAGNOSIS — R000 Tachycardia, unspecified: Secondary | ICD-10-CM | POA: Insufficient documentation

## 2022-12-31 DIAGNOSIS — I739 Peripheral vascular disease, unspecified: Secondary | ICD-10-CM

## 2022-12-31 DIAGNOSIS — Z87891 Personal history of nicotine dependence: Secondary | ICD-10-CM

## 2022-12-31 DIAGNOSIS — I2510 Atherosclerotic heart disease of native coronary artery without angina pectoris: Secondary | ICD-10-CM

## 2022-12-31 NOTE — Assessment & Plan Note (Signed)
Assessment: Patient's most recent LDL-C was off of therapy He has since resumed rosuvastatin 10 mg daily He is not intolerant to any cholesterol medications, and has not taken higher doses of rosuvastatin We discussed the importance of decreasing cholesterol to reduce his risk of heart attack and stroke We also reviewed signs and symptoms of heart attack per patient request Patient reports numbness and coldness in his right leg, will discuss this with Dr. Johney Frame.  Potentially neuropathy however we will see if she wants to rule out PAD Will get a direct LDL today since patient is not fasting  Plan: Will wait for most recent LDL-C to result before making any changes to medications We did discuss the potential for increasing rosuvastatin to 20 mg or adding ezetimibe I will call patient tomorrow to discuss

## 2022-12-31 NOTE — Progress Notes (Unsigned)
Patient ID: Chase Lloyd                 DOB: 1955/12/01                    MRN: [id number]      HPI: Chase Lloyd is a 67 y.o. male patient referred to lipid clinic by Dr. [name], now a Dr. Johney Frame pt. PMH is significant for chronic systolic heart failure, COPD Gold 3, and CA calcification/LM disease on FFRCTA (04/2019). Cath suggested better to treat medically.  Previously had stopped taking his rosuvastatin.  LDL-C in January was 132.  Patient presents today accompanied by his wife.  Reports that the labs drawn in January were before he resumed rosuvastatin.  States he saw his labs and decided to restart.  He had stopped rosuvastatin not because of intolerance but just because he become frustrated with the amount of medications that he was taking and has stopped taking all his medications.  He has never been on higher doses of rosuvastatin, only has ever been on 10 mg.  While talking patient reports that his right leg will often times tingle and feel cold and numb.  He said it this happens especially in the evening.  This is had not been happening for years.  No pain.  Reports that his toes do turn white in both feet but the numbness and cold feeling only happens in his right.   Current Medications: rosuvastatin '10mg'$  daily Intolerances: None Risk Factors: CAD coronary calcium score greater than 1000 LDL-C goal: <70 ApoB goal: <80  Diet: Not discussed today  Exercise: pulmonary rehab, exercise overall limited due to lung disease patient wears oxygen  Family History:  Family History  Problem Relation Age of Onset   Hypertension Father    Diabetes Father    Testicular cancer Brother    Melanoma Brother     Social History: quit smoking years ago, 2-3 beers 3 times a week  Labs: Lipid Panel     Component Value Date/Time   CHOL 194 11/11/2022 0900   CHOL 136 07/01/2019 0741   TRIG 95 11/11/2022 0900   HDL 43 11/11/2022 0900   HDL 47 07/01/2019 0741   CHOLHDL 4.5 11/11/2022  0900   VLDL 19 11/11/2022 0900   LDLCALC 132 (H) 11/11/2022 0900   LDLCALC 66 07/01/2019 0741   LABVLDL 23 07/01/2019 0741    Past Medical History:  Diagnosis Date   Arthritis    Back pain    COPD (chronic obstructive pulmonary disease) (HCC)    DDD (degenerative disc disease), lumbar    Depression    Emphysema of lung ([place])    Empyema ([place])    2016  (was in [place])   GERD (gastroesophageal reflux disease)    Hearing loss of right ear    started 2001.     microphone in right ear ,  and hearing aid in left   Hodgkin's disease ([place]) 1999   Hypotension    Meniere's disease    Pneumonia    Tinnitus     Current Outpatient Medications on File Prior to Visit  Medication Sig Dispense Refill   albuterol (PROVENTIL) (2.5 MG/3ML) 0.083% nebulizer solution Take 3 mLs (2.5 mg total) by nebulization every 4 (four) hours as needed for wheezing or shortness of breath. 75 mL 12   albuterol (VENTOLIN HFA) 108 (90 Base) MCG/ACT inhaler Inhale 2 puffs into the lungs every 6 (six) hours as needed for  wheezing or shortness of breath. 8 g 6   aspirin 81 MG chewable tablet Chew 81 mg by mouth daily.     Budeson-Glycopyrrol-Formoterol (BREZTRI AEROSPHERE) 160-9-4.8 MCG/ACT AERO Inhale 2 puffs into the lungs 2 (two) times daily. 3 each 3   furosemide (LASIX) 20 MG tablet Take 20 mg by mouth daily.     ibuprofen (ADVIL) 800 MG tablet TAKE 1 TABLET BY MOUTH EVERY 8 HOURS AS NEEDED 270 tablet 0   metoprolol succinate (TOPROL-XL) 25 MG 24 hr tablet Take 1 tablet (25 mg total) by mouth daily. 90 tablet 3   omeprazole (PRILOSEC) 40 MG capsule Take 1 capsule by mouth twice daily 180 capsule 0   rosuvastatin (CRESTOR) 10 MG tablet Take 1 tablet (10 mg total) by mouth daily. 90 tablet 3   sodium chloride (OCEAN) 0.65 % SOLN nasal spray Place 1 spray into both nostrils as needed for congestion.     No current facility-administered medications on file prior to visit.    No Known  Allergies  Assessment/Plan:  1. Hyperlipidemia -  Hyperlipidemia LDL goal <70 Assessment: Patient's most recent LDL-C was off of therapy He has since resumed rosuvastatin 10 mg daily He is not intolerant to any cholesterol medications, and has not taken higher doses of rosuvastatin We discussed the importance of decreasing cholesterol to reduce his risk of heart attack and stroke We also reviewed signs and symptoms of heart attack per patient request Patient reports numbness and coldness in his right leg, will discuss this with Dr. Johney Frame.  Potentially neuropathy however we will see if she wants to rule out PAD Will get a direct LDL today since patient is not fasting  Plan: Will wait for most recent LDL-C to result before making any changes to medications We did discuss the potential for increasing rosuvastatin to 20 mg or adding ezetimibe I will call patient tomorrow to discuss    Thank you,  Ramond Dial, Pharm.D, BCPS, CPP [facility] of [facility] [address], [place], [place] [address]  Phone: [phone]; Fax: [phone]

## 2022-12-31 NOTE — Progress Notes (Signed)
Pulmonary Individual Treatment Plan  Patient Details  Name: Chase Lloyd MRN: [id number] Date of Birth: November 21, 1955 Referring Provider:   Flowsheet Row PULMONARY REHAB COPD ORIENTATION from 09/12/2022 in [place]  Referring Provider Dr. Shearon Stalls       Initial Encounter Date:  Flowsheet Row PULMONARY REHAB COPD ORIENTATION from 09/12/2022 in [place]  Date 09/12/22       Visit Diagnosis: Stage 4 very severe COPD by GOLD classification ([place])  Patient's Home Medications on Admission:   Current Outpatient Medications:    albuterol (PROVENTIL) (2.5 MG/3ML) 0.083% nebulizer solution, Take 3 mLs (2.5 mg total) by nebulization every 4 (four) hours as needed for wheezing or shortness of breath., Disp: 75 mL, Rfl: 12   albuterol (VENTOLIN HFA) 108 (90 Base) MCG/ACT inhaler, Inhale 2 puffs into the lungs every 6 (six) hours as needed for wheezing or shortness of breath., Disp: 8 g, Rfl: 6   aspirin 81 MG chewable tablet, Chew 81 mg by mouth daily., Disp: , Rfl:    Budeson-Glycopyrrol-Formoterol (BREZTRI AEROSPHERE) 160-9-4.8 MCG/ACT AERO, Inhale 2 puffs into the lungs 2 (two) times daily., Disp: 3 each, Rfl: 3   furosemide (LASIX) 20 MG tablet, Take 20 mg by mouth daily., Disp: , Rfl:    ibuprofen (ADVIL) 800 MG tablet, TAKE 1 TABLET BY MOUTH EVERY 8 HOURS AS NEEDED, Disp: 270 tablet, Rfl: 0   metoprolol succinate (TOPROL-XL) 25 MG 24 hr tablet, Take 1 tablet (25 mg total) by mouth daily., Disp: 90 tablet, Rfl: 3   omeprazole (PRILOSEC) 40 MG capsule, Take 1 capsule by mouth twice daily, Disp: 180 capsule, Rfl: 0   rosuvastatin (CRESTOR) 10 MG tablet, Take 1 tablet (10 mg total) by mouth daily., Disp: 90 tablet, Rfl: 3   sodium chloride (OCEAN) 0.65 % SOLN nasal spray, Place 1 spray into both nostrils as needed for congestion., Disp: , Rfl:   Past Medical History: Past Medical History:  Diagnosis Date   Arthritis    Back pain    COPD  (chronic obstructive pulmonary disease) (HCC)    DDD (degenerative disc disease), lumbar    Depression    Emphysema of lung ([place])    Empyema ([place])    2016  (was in [place])   GERD (gastroesophageal reflux disease)    Hearing loss of right ear    started 2001.     microphone in right ear ,  and hearing aid in left   Hodgkin's disease ([place]) 1999   Hypotension    Meniere's disease    Pneumonia    Tinnitus     Tobacco Use: Social History   Tobacco Use  Smoking Status Former   Packs/day: 1.50   Years: 47.00   Total pack years: 70.50   Types: Cigarettes   Quit date: 06/13/2018   Years since quitting: 4.5  Smokeless Tobacco Never    Labs: Review Flowsheet  More data may exist      Latest Ref Rng & Units 05/05/2018 04/29/2019 07/01/2019 11/22/2021 11/11/2022  Labs for ITP Cardiac and Pulmonary Rehab  Cholestrol 0 - 200 mg/dL 182  - 136  173  194   LDL (calc) 0 - 99 mg/dL 91  - 66  81  132   HDL-C >40 mg/dL 60.50  - 47  53.20  43   Trlycerides <150 mg/dL 153.0  - 116  196.0  95   PH, Arterial 7.350 - 7.450 - 7.338  - - -  PCO2  arterial 32.0 - 48.0 mmHg - 54.3  - - -  Bicarbonate 20.0 - 28.0 mmol/L - 29.2  30.0  31.0  - - -  TCO2 22 - 32 mmol/L - 31  32  33  - - -  O2 Saturation % - 100.0  73.0  72.0  - - -    Capillary Blood Glucose: No results found for: "GLUCAP"   Pulmonary Assessment Scores:  Pulmonary Assessment Scores     Row Name 09/12/22 1422         ADL UCSD   ADL Phase Entry     SOB Score total 66     Rest 1     Walk 2     Stairs 4     Bath 3     Dress 2     Shop 1       CAT Score   CAT Score 30       mMRC Score   mMRC Score 3             UCSD: Self-administered rating of dyspnea associated with activities of daily living (ADLs) 6-point scale (0 = "not at all" to 5 = "maximal or unable to do because of breathlessness")  Scoring Scores range from 0 to 120.  Minimally important difference is 5 units  CAT: CAT can identify the health  impairment of COPD patients and is better correlated with disease progression.  CAT has a scoring range of zero to 40. The CAT score is classified into four groups of low (less than 10), medium (10 - 20), high (21-30) and very high (31-40) based on the impact level of disease on health status. A CAT score over 10 suggests significant symptoms.  A worsening CAT score could be explained by an exacerbation, poor medication adherence, poor inhaler technique, or progression of COPD or comorbid conditions.  CAT MCID is 2 points  mMRC: mMRC (Modified Medical Research Council) Dyspnea Scale is used to assess the degree of baseline functional disability in patients of respiratory disease due to dyspnea. No minimal important difference is established. A decrease in score of 1 point or greater is considered a positive change.   Pulmonary Function Assessment:  Pulmonary Function Assessment - 09/12/22 1422       Post Bronchodilator Spirometry Results   FVC% 45 %    FEV1% 22 %    FEV1/FVC Ratio 45      Breath   Shortness of Breath Panic with Shortness of Breath;Yes;Fear of Shortness of Breath;Limiting activity             Exercise Target Goals: Exercise Program Goal: Individual exercise prescription set using results from initial 6 min walk test and THRR while considering  patient's activity barriers and safety.   Exercise Prescription Goal: Initial exercise prescription builds to 30-45 minutes a day of aerobic activity, 2-3 days per week.  Home exercise guidelines will be given to patient during program as part of exercise prescription that the participant will acknowledge.  Activity Barriers & Risk Stratification:  Activity Barriers & Cardiac Risk Stratification - 09/12/22 1303       Activity Barriers & Cardiac Risk Stratification   Activity Barriers Left Hip Replacement;Back Problems;Deconditioning;Shortness of Breath;Arthritis;Assistive Device    Cardiac Risk Stratification Moderate              6 Minute Walk:  6 Minute Walk     Row Name 09/12/22 1413         6 Minute  Walk   Phase Initial     Distance 850 feet     Walk Time 6 minutes     # of Rest Breaks 0     MPH 1.61     METS 3.07     RPE 12     Perceived Dyspnea  13     VO2 Peak 10.76     Symptoms No     Resting HR 95 bpm     Resting BP 180/60     Resting Oxygen Saturation  97 %     Exercise Oxygen Saturation  during 6 min walk 90 %     Max Ex. HR 114 bpm     Max Ex. BP 186/54     2 Minute Post BP 168/50       Interval HR   1 Minute HR 102     2 Minute HR 107     3 Minute HR 110     4 Minute HR 112     5 Minute HR 114     6 Minute HR 114     2 Minute Post HR 107     Interval Heart Rate? Yes       Interval Oxygen   Interval Oxygen? Yes     Baseline Oxygen Saturation % 97 %     1 Minute Oxygen Saturation % 92 %     1 Minute Liters of Oxygen 2 L     2 Minute Oxygen Saturation % 92 %     2 Minute Liters of Oxygen 2 L     3 Minute Oxygen Saturation % 90 %     3 Minute Liters of Oxygen 2 L     4 Minute Oxygen Saturation % 91 %     4 Minute Liters of Oxygen 2 L     5 Minute Oxygen Saturation % 91 %     5 Minute Liters of Oxygen 2 L     6 Minute Oxygen Saturation % 91 %     6 Minute Liters of Oxygen 2 L     2 Minute Post Oxygen Saturation % 95 %     2 Minute Post Liters of Oxygen 2 L              Oxygen Initial Assessment:  Oxygen Initial Assessment - 12/02/22 1510       Home Oxygen   Home Oxygen Device Portable Concentrator;Home Concentrator    Sleep Oxygen Prescription Continuous    Liters per minute 2    Home Exercise Oxygen Prescription Continuous    Liters per minute 2    Home Resting Oxygen Prescription Continuous    Liters per minute 2    Compliance with Home Oxygen Use Yes      Initial 6 min Walk   Oxygen Used Continuous    Liters per minute 2      Program Oxygen Prescription   Program Oxygen Prescription Continuous    Liters per minute 4    Comments Pt  has been increasing oxygen to 4L when exercising      Intervention   Short Term Goals To learn and exhibit compliance with exercise, home and travel O2 prescription;To learn and understand importance of monitoring SPO2 with pulse oximeter and demonstrate accurate use of the pulse oximeter.;To learn and understand importance of maintaining oxygen saturations>88%;To learn and demonstrate proper pursed lip breathing techniques or other breathing techniques.     Antonio  Term Goals  Exhibits compliance with exercise, home  and travel O2 prescription;Verbalizes importance of monitoring SPO2 with pulse oximeter and return demonstration;Maintenance of O2 saturations>88%;Exhibits proper breathing techniques, such as pursed lip breathing or other method taught during program session;Compliance with respiratory medication             Oxygen Re-Evaluation:  Oxygen Re-Evaluation     Row Name 10/07/22 1443 11/04/22 1553 12/30/22 1441         Program Oxygen Prescription   Program Oxygen Prescription Continuous Continuous Continuous     Liters per minute '4 4 4     '$ Comments Pt has been increasing oxygen to 4L when exercising Pt has been increasing oxygen to 4L when exercising --       Home Oxygen   Home Oxygen Device Portable Concentrator;Home Concentrator Portable Concentrator;Home Concentrator Portable Concentrator;Home Concentrator     Sleep Oxygen Prescription Continuous Continuous Continuous     Liters per minute '2 2 2     '$ Home Exercise Oxygen Prescription Continuous Continuous Continuous     Liters per minute '2 2 2     '$ Home Resting Oxygen Prescription Continuous Continuous Continuous     Liters per minute '2 2 2     '$ Compliance with Home Oxygen Use Yes Yes Yes       Goals/Expected Outcomes   Short Term Goals To learn and exhibit compliance with exercise, home and travel O2 prescription;To learn and understand importance of monitoring SPO2 with pulse oximeter and demonstrate accurate use of the  pulse oximeter.;To learn and understand importance of maintaining oxygen saturations>88%;To learn and demonstrate proper pursed lip breathing techniques or other breathing techniques.  To learn and exhibit compliance with exercise, home and travel O2 prescription;To learn and understand importance of monitoring SPO2 with pulse oximeter and demonstrate accurate use of the pulse oximeter.;To learn and understand importance of maintaining oxygen saturations>88%;To learn and demonstrate proper pursed lip breathing techniques or other breathing techniques.  To learn and exhibit compliance with exercise, home and travel O2 prescription;To learn and understand importance of monitoring SPO2 with pulse oximeter and demonstrate accurate use of the pulse oximeter.;To learn and understand importance of maintaining oxygen saturations>88%;To learn and demonstrate proper pursed lip breathing techniques or other breathing techniques.      Simenson  Term Goals Exhibits compliance with exercise, home  and travel O2 prescription;Verbalizes importance of monitoring SPO2 with pulse oximeter and return demonstration;Maintenance of O2 saturations>88%;Exhibits proper breathing techniques, such as pursed lip breathing or other method taught during program session;Compliance with respiratory medication Exhibits compliance with exercise, home  and travel O2 prescription;Verbalizes importance of monitoring SPO2 with pulse oximeter and return demonstration;Maintenance of O2 saturations>88%;Exhibits proper breathing techniques, such as pursed lip breathing or other method taught during program session;Compliance with respiratory medication Exhibits compliance with exercise, home  and travel O2 prescription;Verbalizes importance of monitoring SPO2 with pulse oximeter and return demonstration;Maintenance of O2 saturations>88%;Exhibits proper breathing techniques, such as pursed lip breathing or other method taught during program session;Compliance  with respiratory medication     Goals/Expected Outcomes Compliance Compliance Compliance              Oxygen Discharge (Final Oxygen Re-Evaluation):  Oxygen Re-Evaluation - 12/30/22 1441       Program Oxygen Prescription   Program Oxygen Prescription Continuous    Liters per minute 4      Home Oxygen   Home Oxygen Device Portable Concentrator;Home Concentrator    Sleep Oxygen Prescription Continuous    Liters per  minute 2    Home Exercise Oxygen Prescription Continuous    Liters per minute 2    Home Resting Oxygen Prescription Continuous    Liters per minute 2    Compliance with Home Oxygen Use Yes      Goals/Expected Outcomes   Short Term Goals To learn and exhibit compliance with exercise, home and travel O2 prescription;To learn and understand importance of monitoring SPO2 with pulse oximeter and demonstrate accurate use of the pulse oximeter.;To learn and understand importance of maintaining oxygen saturations>88%;To learn and demonstrate proper pursed lip breathing techniques or other breathing techniques.     [place]  Term Goals Exhibits compliance with exercise, home  and travel O2 prescription;Verbalizes importance of monitoring SPO2 with pulse oximeter and return demonstration;Maintenance of O2 saturations>88%;Exhibits proper breathing techniques, such as pursed lip breathing or other method taught during program session;Compliance with respiratory medication    Goals/Expected Outcomes Compliance             Initial Exercise Prescription:  Initial Exercise Prescription - 09/12/22 1400       Date of Initial Exercise RX and Referring Provider   Date 09/12/22    Referring Provider Dr. Shearon Stalls    Expected Discharge Date 01/15/23      Oxygen   Oxygen Continuous    Liters 2    Maintain Oxygen Saturation 88% or higher      NuStep   Level 1    SPM 60    Minutes 17      Arm Ergometer   Level 1    RPM 50    Minutes 22      Prescription Details   Frequency  (times per week) 2    Duration Progress to 30 minutes of continuous aerobic without signs/symptoms of physical distress      Intensity   THRR 40-80% of Max Heartrate 62-123    Ratings of Perceived Exertion 11-13    Perceived Dyspnea 0-4      Resistance Training   Training Prescription Yes    Weight 3    Reps 10-15             Perform Capillary Blood Glucose checks as needed.  Exercise Prescription Changes:   Exercise Prescription Changes     Row Name 09/23/22 1500 10/07/22 1400 10/09/22 1400 11/04/22 1500 11/18/22 1400     Response to Exercise   Blood Pressure (Admit) 140/60 130/52 158/52 118/48 120/38   Blood Pressure (Exercise) 158/56 152/52 148/50 126/58 124/40   Blood Pressure (Exit) 140/60 132/54 150/56 126/52 120/40   Heart Rate (Admit) 98 bpm 90 bpm 96 bpm 94 bpm 94 bpm   Heart Rate (Exercise) 108 bpm 103 bpm 106 bpm 94 bpm 110 bpm   Heart Rate (Exit) 100 bpm 97 bpm 104 bpm 101 bpm 76 bpm   Oxygen Saturation (Admit) 94 % 92 % 90 % 95 % 91 %   Oxygen Saturation (Exercise) 95 % 90 % 92 % 96 % 94 %   Oxygen Saturation (Exit) 97 % 92 % 94 % 94 % 97 %   Rating of Perceived Exertion (Exercise) '12 13 12 12 12   '$ Perceived Dyspnea (Exercise) '13 13 12 12 12   '$ Duration Continue with 30 min of aerobic exercise without signs/symptoms of physical distress. Continue with 30 min of aerobic exercise without signs/symptoms of physical distress. Continue with 30 min of aerobic exercise without signs/symptoms of physical distress. Continue with 30 min of aerobic exercise without signs/symptoms of  physical distress. Continue with 30 min of aerobic exercise without signs/symptoms of physical distress.   Intensity THRR unchanged THRR unchanged THRR unchanged THRR unchanged THRR unchanged     Progression   Progression Continue to progress workloads to maintain intensity without signs/symptoms of physical distress. Continue to progress workloads to maintain intensity without signs/symptoms  of physical distress. Continue to progress workloads to maintain intensity without signs/symptoms of physical distress. Continue to progress workloads to maintain intensity without signs/symptoms of physical distress. Continue to progress workloads to maintain intensity without signs/symptoms of physical distress.     Resistance Training   Training Prescription Yes Yes Yes Yes Yes   Weight '3 3 3 5 5   '$ Reps 10-15 10-15 10-15 10-15 10-15   Time 10 Minutes 10 Minutes 10 Minutes 10 Minutes 10 Minutes     Oxygen   Oxygen Continuous Continuous Continuous Continuous Continuous   Liters '3 4 4 2 5     '$ NuStep   Level '1 1 1 2 3   '$ SPM 68 81 24 69 74   Minutes 39 22 39 39 39   METs 1.7 1.8 1.8 1.7 1.8     Arm Ergometer   Level -- 1 -- -- --   RPM -- 43 -- -- --   Minutes -- 17 -- -- --   METs -- 1.8 -- -- --    Row Name 12/02/22 1400 12/16/22 1400 12/30/22 1400         Response to Exercise   Blood Pressure (Admit) 130/52 138/58 142/52     Blood Pressure (Exercise) 142/62 134/62 140/50     Blood Pressure (Exit) 132/78 120/46 124/50     Heart Rate (Admit) 96 bpm 80 bpm 86 bpm     Heart Rate (Exercise) 117 bpm 95 bpm 89 bpm     Heart Rate (Exit) 105 bpm 86 bpm 82 bpm     Oxygen Saturation (Admit) 96 % 96 % 96 %     Oxygen Saturation (Exercise) 96 % 98 % 96 %     Oxygen Saturation (Exit) 97 % 98 % 99 %     Rating of Perceived Exertion (Exercise) '12 12 12     '$ Perceived Dyspnea (Exercise) '12 12 12     '$ Duration Continue with 30 min of aerobic exercise without signs/symptoms of physical distress. Continue with 30 min of aerobic exercise without signs/symptoms of physical distress. Continue with 30 min of aerobic exercise without signs/symptoms of physical distress.     Intensity THRR unchanged THRR unchanged THRR unchanged       Progression   Progression Continue to progress workloads to maintain intensity without signs/symptoms of physical distress. Continue to progress workloads to maintain  intensity without signs/symptoms of physical distress. Continue to progress workloads to maintain intensity without signs/symptoms of physical distress.       Resistance Training   Training Prescription Yes Yes Yes     Weight '5 5 5     '$ Reps 10-15 10-15 10-15     Time 10 Minutes 10 Minutes 10 Minutes       Oxygen   Oxygen Continuous Continuous Continuous     Liters '4 4 4       '$ NuStep   Level '3 3 3     '$ SPM 76 67 74     Minutes 39 39 39     METs 1.8 1.7 1.8       Home Exercise Plan   Plans to continue exercise  at Home (comment) -- --     Frequency Add 3 additional days to program exercise sessions. -- --     Initial Home Exercises Provided 12/02/22 -- --              Exercise Comments:   Exercise Comments     Row Name 12/02/22 1410           Exercise Comments home exercise reviewed                Exercise Goals and Review:   Exercise Goals     Row Name 09/12/22 1417 10/07/22 1439 11/04/22 1547 12/02/22 1512 12/30/22 1437     Exercise Goals   Increase Physical Activity Yes Yes Yes Yes Yes   Intervention Provide advice, education, support and counseling about physical activity/exercise needs.;Develop an individualized exercise prescription for aerobic and resistive training based on initial evaluation findings, risk stratification, comorbidities and participant's personal goals. Provide advice, education, support and counseling about physical activity/exercise needs.;Develop an individualized exercise prescription for aerobic and resistive training based on initial evaluation findings, risk stratification, comorbidities and participant's personal goals. Provide advice, education, support and counseling about physical activity/exercise needs.;Develop an individualized exercise prescription for aerobic and resistive training based on initial evaluation findings, risk stratification, comorbidities and participant's personal goals. Provide advice, education, support and  counseling about physical activity/exercise needs.;Develop an individualized exercise prescription for aerobic and resistive training based on initial evaluation findings, risk stratification, comorbidities and participant's personal goals. Provide advice, education, support and counseling about physical activity/exercise needs.;Develop an individualized exercise prescription for aerobic and resistive training based on initial evaluation findings, risk stratification, comorbidities and participant's personal goals.   Expected Outcomes Short Term: Attend rehab on a regular basis to increase amount of physical activity.;Torosian Term: Add in home exercise to make exercise part of routine and to increase amount of physical activity.;Fouts Term: Exercising regularly at least 3-5 days a week. Short Term: Attend rehab on a regular basis to increase amount of physical activity.;Peddie Term: Add in home exercise to make exercise part of routine and to increase amount of physical activity.;Brasher Term: Exercising regularly at least 3-5 days a week. Short Term: Attend rehab on a regular basis to increase amount of physical activity.;Hazel Term: Add in home exercise to make exercise part of routine and to increase amount of physical activity.;Start Term: Exercising regularly at least 3-5 days a week. Short Term: Attend rehab on a regular basis to increase amount of physical activity.;Fiorini Term: Add in home exercise to make exercise part of routine and to increase amount of physical activity.;Cabal Term: Exercising regularly at least 3-5 days a week. Short Term: Attend rehab on a regular basis to increase amount of physical activity.;Buczynski Term: Add in home exercise to make exercise part of routine and to increase amount of physical activity.;Buonocore Term: Exercising regularly at least 3-5 days a week.   Increase Strength and Stamina Yes Yes Yes Yes Yes   Intervention Provide advice, education, support and counseling about physical  activity/exercise needs.;Develop an individualized exercise prescription for aerobic and resistive training based on initial evaluation findings, risk stratification, comorbidities and participant's personal goals. Provide advice, education, support and counseling about physical activity/exercise needs.;Develop an individualized exercise prescription for aerobic and resistive training based on initial evaluation findings, risk stratification, comorbidities and participant's personal goals. Provide advice, education, support and counseling about physical activity/exercise needs.;Develop an individualized exercise prescription for aerobic and resistive training based on initial evaluation findings, risk stratification, comorbidities  and participant's personal goals. Provide advice, education, support and counseling about physical activity/exercise needs.;Develop an individualized exercise prescription for aerobic and resistive training based on initial evaluation findings, risk stratification, comorbidities and participant's personal goals. Provide advice, education, support and counseling about physical activity/exercise needs.;Develop an individualized exercise prescription for aerobic and resistive training based on initial evaluation findings, risk stratification, comorbidities and participant's personal goals.   Expected Outcomes Short Term: Increase workloads from initial exercise prescription for resistance, speed, and METs.;Short Term: Perform resistance training exercises routinely during rehab and add in resistance training at home;Chism Term: Improve cardiorespiratory fitness, muscular endurance and strength as measured by increased METs and functional capacity (6MWT) Short Term: Increase workloads from initial exercise prescription for resistance, speed, and METs.;Short Term: Perform resistance training exercises routinely during rehab and add in resistance training at home;Pavlicek Term: Improve  cardiorespiratory fitness, muscular endurance and strength as measured by increased METs and functional capacity (6MWT) Short Term: Increase workloads from initial exercise prescription for resistance, speed, and METs.;Short Term: Perform resistance training exercises routinely during rehab and add in resistance training at home;Matzek Term: Improve cardiorespiratory fitness, muscular endurance and strength as measured by increased METs and functional capacity (6MWT) Short Term: Increase workloads from initial exercise prescription for resistance, speed, and METs.;Short Term: Perform resistance training exercises routinely during rehab and add in resistance training at home;Toth Term: Improve cardiorespiratory fitness, muscular endurance and strength as measured by increased METs and functional capacity (6MWT) Short Term: Increase workloads from initial exercise prescription for resistance, speed, and METs.;Short Term: Perform resistance training exercises routinely during rehab and add in resistance training at home;[place] Term: Improve cardiorespiratory fitness, muscular endurance and strength as measured by increased METs and functional capacity (6MWT)   Able to understand and use rate of perceived exertion (RPE) scale Yes Yes Yes Yes Yes   Intervention Provide education and explanation on how to use RPE scale Provide education and explanation on how to use RPE scale Provide education and explanation on how to use RPE scale Provide education and explanation on how to use RPE scale Provide education and explanation on how to use RPE scale   Expected Outcomes Short Term: Able to use RPE daily in rehab to express subjective intensity level;[place] Term:  Able to use RPE to guide intensity level when exercising independently -- Short Term: Able to use RPE daily in rehab to express subjective intensity level;[place] Term:  Able to use RPE to guide intensity level when exercising independently Short Term: Able to use RPE  daily in rehab to express subjective intensity level;[place] Term:  Able to use RPE to guide intensity level when exercising independently Short Term: Able to use RPE daily in rehab to express subjective intensity level;[place] Term:  Able to use RPE to guide intensity level when exercising independently   Able to understand and use Dyspnea scale Yes Yes Yes Yes Yes   Intervention Provide education and explanation on how to use Dyspnea scale Provide education and explanation on how to use Dyspnea scale Provide education and explanation on how to use Dyspnea scale Provide education and explanation on how to use Dyspnea scale Provide education and explanation on how to use Dyspnea scale   Expected Outcomes [place] Term: Able to use Dyspnea scale to guide intensity level when exercising independently;Short Term: Able to use Dyspnea scale daily in rehab to express subjective sense of shortness of breath during exertion [place] Term: Able to use Dyspnea scale to guide intensity level when exercising independently;Short Term:  Able to use Dyspnea scale daily in rehab to express subjective sense of shortness of breath during exertion [place] Term: Able to use Dyspnea scale to guide intensity level when exercising independently;Short Term: Able to use Dyspnea scale daily in rehab to express subjective sense of shortness of breath during exertion [place] Term: Able to use Dyspnea scale to guide intensity level when exercising independently;Short Term: Able to use Dyspnea scale daily in rehab to express subjective sense of shortness of breath during exertion [place] Term: Able to use Dyspnea scale to guide intensity level when exercising independently;Short Term: Able to use Dyspnea scale daily in rehab to express subjective sense of shortness of breath during exertion   Knowledge and understanding of Target Heart Rate Range (THRR) Yes Yes Yes Yes Yes   Intervention Provide education and explanation of THRR including how the numbers were  predicted and where they are located for reference Provide education and explanation of THRR including how the numbers were predicted and where they are located for reference Provide education and explanation of THRR including how the numbers were predicted and where they are located for reference Provide education and explanation of THRR including how the numbers were predicted and where they are located for reference Provide education and explanation of THRR including how the numbers were predicted and where they are located for reference   Expected Outcomes Short Term: Able to state/look up THRR;[place] Term: Able to use THRR to govern intensity when exercising independently;Short Term: Able to use daily as guideline for intensity in rehab Short Term: Able to state/look up THRR;[place] Term: Able to use THRR to govern intensity when exercising independently;Short Term: Able to use daily as guideline for intensity in rehab Short Term: Able to state/look up THRR;[place] Term: Able to use THRR to govern intensity when exercising independently;Short Term: Able to use daily as guideline for intensity in rehab Short Term: Able to state/look up THRR;[place] Term: Able to use THRR to govern intensity when exercising independently;Short Term: Able to use daily as guideline for intensity in rehab Short Term: Able to state/look up THRR;[place] Term: Able to use THRR to govern intensity when exercising independently;Short Term: Able to use daily as guideline for intensity in rehab   Understanding of Exercise Prescription Yes Yes Yes Yes Yes   Intervention Provide education, explanation, and written materials on patient's individual exercise prescription Provide education, explanation, and written materials on patient's individual exercise prescription Provide education, explanation, and written materials on patient's individual exercise prescription Provide education, explanation, and written materials on patient's individual exercise  prescription Provide education, explanation, and written materials on patient's individual exercise prescription   Expected Outcomes Short Term: Able to explain program exercise prescription;[place] Term: Able to explain home exercise prescription to exercise independently Short Term: Able to explain program exercise prescription;[place] Term: Able to explain home exercise prescription to exercise independently Short Term: Able to explain program exercise prescription;[place] Term: Able to explain home exercise prescription to exercise independently Short Term: Able to explain program exercise prescription;[place] Term: Able to explain home exercise prescription to exercise independently Short Term: Able to explain program exercise prescription;[place]ard Term: Able to explain home exercise prescription to exercise independently            Exercise Goals Re-Evaluation :  Exercise Goals Re-Evaluation     [place] Name 10/07/22 1439 11/04/22 1547 12/02/22 1512 12/30/22 1437       Exercise Goal Re-Evaluation   Exercise Goals Review Increase Strength and Stamina;Increase Physical Activity;Able to understand  and use Dyspnea scale;Able to understand and use rate of perceived exertion (RPE) scale;Knowledge and understanding of Target Heart Rate Range (THRR);Understanding of Exercise Prescription Increase Physical Activity;Increase Strength and Stamina;Able to understand and use rate of perceived exertion (RPE) scale;Able to understand and use Dyspnea scale;Knowledge and understanding of Target Heart Rate Range (THRR);Understanding of Exercise Prescription Increase Physical Activity;Increase Strength and Stamina;Able to understand and use rate of perceived exertion (RPE) scale;Able to understand and use Dyspnea scale;Knowledge and understanding of Target Heart Rate Range (THRR);Understanding of Exercise Prescription Increase Physical Activity;Increase Strength and Stamina;Able to understand and use rate of perceived exertion (RPE)  scale;Understanding of Exercise Prescription;Knowledge and understanding of Target Heart Rate Range (THRR);Able to understand and use Dyspnea scale    Comments Pt has completed 5 sessions of PR. He has wanted to use the treadmill but due to walking with a walker and him being a high fall risk staff have had to tell him that he is not able to. He does seem to get discouraged during exercise due to his SOB and needing to take breaks to bring hos SpO2 up. He is currently exercising at 1.8 METs on the stepper. Will continue to monitor and progress as able. Pt has completed 9 sessions of PR. He has been able to use 2L of oxygen when exercising the past two sessions and not needing to go back up to 4L. He no longer uses the arm ergometer, he states that it hurts his shoulders. He continues to ask to use the treadmill and has to be reminded that he is a fall risk due to using a upright walker. He is currently exercising at 1.7 METs on the stepper. Will continue to monitor and progress as able. Pt has completed 18 sessions of PR. He has been increasing his oxygen to 4L while exercising. He no longer is able to use the arm ergometer due to shoulder pain. Pt has been frustrated that staff will not allow him to use the treadmill. We continuously reinforce that he is a high fall risk and that he uses a high walker, so he would be unable to take the weight off of his hips on the treadmill like he does when he walks with the high walker. His progress has been slow in the program. He is currently exercising at 1.8 METs on the stepper. Will continue to monitor and progess as able. Pt has completed24 sessions of PR. He continues to use the stepper for both sessions and is tolerating it well. He continues to progress slowly and has slowly increased his SPM. He is currently exercising at 1.8 METs on the stepper. Will continue to monitor and progress as able.    Expected Outcomes Through exercise at home and at rehab, the patient  will meet their stated goals. Through exercise at home and at rehab, the patient will meet their stated goals. Through exercise at home and at rehab, the patient will meet their stated goals. Through exercise at home and at rehab, the patient will meet their stated goals.             Discharge Exercise Prescription (Final Exercise Prescription Changes):  Exercise Prescription Changes - 12/30/22 1400       Response to Exercise   Blood Pressure (Admit) 142/52    Blood Pressure (Exercise) 140/50    Blood Pressure (Exit) 124/50    Heart Rate (Admit) 86 bpm    Heart Rate (Exercise) 89 bpm    Heart Rate (Exit) 82  bpm    Oxygen Saturation (Admit) 96 %    Oxygen Saturation (Exercise) 96 %    Oxygen Saturation (Exit) 99 %    Rating of Perceived Exertion (Exercise) 12    Perceived Dyspnea (Exercise) 12    Duration Continue with 30 min of aerobic exercise without signs/symptoms of physical distress.    Intensity THRR unchanged      Progression   Progression Continue to progress workloads to maintain intensity without signs/symptoms of physical distress.      Resistance Training   Training Prescription Yes    Weight 5    Reps 10-15    Time 10 Minutes      Oxygen   Oxygen Continuous    Liters 4      NuStep   Level 3    SPM 74    Minutes 39    METs 1.8             Nutrition:  Target Goals: Understanding of nutrition guidelines, daily intake of sodium '1500mg'$ , cholesterol '200mg'$ , calories 30% from fat and 7% or less from saturated fats, daily to have 5 or more servings of fruits and vegetables.  Biometrics:  Pre Biometrics - 09/12/22 1417       Pre Biometrics   Height '5\' 11"'$  (1.803 m)    Weight 80.4 kg    Waist Circumference 39 inches    Hip Circumference 39 inches    Waist to Hip Ratio 1 %    BMI (Calculated) 24.73    Triceps Skinfold 9 mm    % Body Fat 23.7 %    Grip Strength 40.7 kg    Flexibility 0 in    Single Leg Stand 0 seconds               Nutrition Therapy Plan and Nutrition Goals:  Nutrition Therapy & Goals - 09/12/22 1401       Personal Nutrition Goals   Comments Patient scored 10 on his diet assessment. His wife cooks and says she trys to cook healthy meals. Handout provided and explained to he and his wife regarding healthier choices. We offer 2 educational sessions on heart healthy nutrition with handouts and assistance with RD referral if patient is interested.      Intervention Plan   Intervention Nutrition handout(s) given to patient.    Expected Outcomes Short Term Goal: Understand basic principles of dietary content, such as calories, fat, sodium, cholesterol and nutrients.             Nutrition Assessments:  Nutrition Assessments - 09/12/22 1401       MEDFICTS Scores   Pre Score 10            MEDIFICTS Score Key: ?70 Need to make dietary changes  40-70 Heart Healthy Diet ? 40 Therapeutic Level Cholesterol Diet   Picture Your Plate Scores: [id number] Unhealthy dietary pattern with much room for improvement. 41-50 Dietary pattern unlikely to meet recommendations for good health and room for improvement. 51-60 More healthful dietary pattern, with some room for improvement.  >60 Healthy dietary pattern, although there may be some specific behaviors that could be improved.    Nutrition Goals Re-Evaluation:   Nutrition Goals Discharge (Final Nutrition Goals Re-Evaluation):   Psychosocial: Target Goals: Acknowledge presence or absence of significant depression and/or stress, maximize coping skills, provide positive support system. Participant is able to verbalize types and ability to use techniques and skills needed for reducing stress and depression.  Initial Review &  Psychosocial Screening:  Initial Psych Review & Screening - 09/12/22 1426       Initial Review   Current issues with Current Anxiety/Panic      Family Dynamics   Good Support System? Yes      Barriers   Psychosocial  barriers to participate in program Psychosocial barriers identified (see note)      Screening Interventions   Interventions Encouraged to exercise;Provide feedback about the scores to participant    Expected Outcomes Short Term goal: Identification and review with participant of any Quality of Life or Depression concerns found by scoring the questionnaire.             Quality of Life Scores:  Quality of Life - 09/12/22 1413       Quality of Life   Select Quality of Life      Quality of Life Scores   Health/Function Pre 6.72 %    Socioeconomic Pre 20.2 %    Psych/Spiritual Pre 7.79 %    Family Pre 27.1 %    GLOBAL Pre 12.08 %            Scores of 19 and below usually indicate a poorer quality of life in these areas.  A difference of  2-3 points is a clinically meaningful difference.  A difference of 2-3 points in the total score of the Quality of Life Index has been associated with significant improvement in overall quality of life, self-image, physical symptoms, and general health in studies assessing change in quality of life.   PHQ-9: Review Flowsheet  More data exists      11/13/2022 09/12/2022 11/12/2021 10/23/2020 12/14/2018  Depression screen PHQ 2/9  Decreased Interest 0 0 0 0 3  Down, Depressed, Hopeless 0 0 0 0 3  PHQ - 2 Score 0 0 0 0 6  Altered sleeping - 3 - - 3  Tired, decreased energy - 3 - - 3  Change in appetite - 3 - - 0  Feeling bad or failure about yourself  - 3 - - 2  Trouble concentrating - 0 - - 0  Moving slowly or fidgety/restless - 0 - - 0  Suicidal thoughts - 0 - - 0  PHQ-9 Score - 12 - - 14  Difficult doing work/chores - Extremely dIfficult - - -   Interpretation of Total Score  Total Score Depression Severity:  1-4 = Minimal depression, 5-9 = Mild depression, 10-14 = Moderate depression, 15-19 = Moderately severe depression, 20-27 = Severe depression   Psychosocial Evaluation and Intervention:  Psychosocial Evaluation - 09/12/22 1427        Psychosocial Evaluation & Interventions   Interventions Encouraged to exercise with the program and follow exercise prescription;Stress management education;Relaxation education    Comments Patient has no psychosocial barriers identified at his orientaiton visit. His wife of 40 years was with him and helped answer questions. He scored 13 on his PHQ-9 and low QOL socres mainly due to his chronic COPD that limits his ability to do activities. He says he used to be very active and loved to restore cars but now is not able to do hardly anything. He and his wife are from [place] and relocated 4 years ago to [place] to be closer to their daughter. They have 5 children, 2 are deceased and 2 are adopted. They have multiple grandchildren. 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 Patient has no diagnosis of anxiety but reports feeling very anxious and panicky  when he gets SOB. His wife feels he needs to be treated for anxiety. I encouraged them to speak with their PCP about this and his wife said she would address with Dr. Shearon Stalls. He also has trouble staying asleep but is not taking anything as a sleep aide. Patient is not sure he can exercise for 34 minutes but he wants to try. He is totally sedentary now and wants to participate in the program to gain some endurance and be able to do more activities with less SOB. He is ready to start the program.    Expected Outcomes Patient will continue to have no psychosocial barriers identified.    Continue Psychosocial Services  No Follow up required             Psychosocial Re-Evaluation:  Psychosocial Re-Evaluation     [place] Name 09/30/22 1331 10/24/22 1130 11/26/22 1442 12/22/22 0949       Psychosocial Re-Evaluation   Current issues with Current Anxiety/Panic Current Anxiety/Panic Current Anxiety/Panic Current Anxiety/Panic    Comments Patient is new to program completeing 3 sessions.  He was referred to pulmonary rehab by Dr. Shearon Stalls with COPD.  His  initial QOL score is 12.08% and his PHQ-9 score is 13. He seems to enjoy coming to the program and demonstartes an interest in  improving his health and continues to demonstarte a positive out look and attitude.  We will continue to monitor as he progresses in the program. Patient has completed 7 sessions.  He has had several doctors appointment in last couple weeks.  He seems to enjoy coming to the program and demonstartes an interest in  improving his health and continues to demonstarte a positive out look and attitude.  We will continue to monitor as he progresses in the program. Patient has completed 15 sessions.  He does not have any psychosocial barriers.  He seems to enjoy coming to the program and demonstrates and interest in improving his health.  We will continue to monitor his progress. Patient has completed 21sessions.   He seems to enjoy coming to the program and demonstartes an interest in improving his health and continues to demonstarte a positive out look and attitude.  Patient interact and socializing well with class and staff . We will continue to monitor as he progresses in the program.    Expected Outcomes Patient will have no psychosocial issuses identified at discharge. Patient will have no psychosocial issuses identified at discharge. Patient will continue to have no psychosocial barriers identified. Patient will continue to have no psychosocial barriers identified.    Interventions Stress management education;Encouraged to attend Pulmonary Rehabilitation for the exercise;Relaxation education Stress management education;Encouraged to attend Pulmonary Rehabilitation for the exercise;Relaxation education Stress management education;Relaxation education;Encouraged to attend Pulmonary Rehabilitation for the exercise Stress management education;Relaxation education;Encouraged to attend Pulmonary Rehabilitation for the exercise    Continue Psychosocial Services  No Follow up required No Follow  up required No Follow up required No Follow up required             Psychosocial Discharge (Final Psychosocial Re-Evaluation):  Psychosocial Re-Evaluation - 12/22/22 0949       Psychosocial Re-Evaluation   Current issues with Current Anxiety/Panic    Comments Patient has completed 21sessions.   He seems to enjoy coming to the program and demonstartes an interest in improving his health and continues to demonstarte a positive out look and attitude.  Patient interact and socializing well with class and staff . We will continue  to monitor as he progresses in the program.    Expected Outcomes Patient will continue to have no psychosocial barriers identified.    Interventions Stress management education;Relaxation education;Encouraged to attend Pulmonary Rehabilitation for the exercise    Continue Psychosocial Services  No Follow up required              Education: Education Goals: Education classes will be provided on a weekly basis, covering required topics. Participant will state understanding/return demonstration of topics presented.  Learning Barriers/Preferences:  Learning Barriers/Preferences - 09/12/22 1404       Learning Barriers/Preferences   Learning Barriers Hearing   Deaf in right ear.   Learning Preferences Written Material;Audio;Pictoral             Education Topics: How Lungs Work and Diseases: - Discuss the anatomy of the lungs and diseases that can affect the lungs, such as COPD. Flowsheet Row PULMONARY REHAB CHRONIC OBSTRUCTIVE PULMONARY DISEASE from 12/25/2022 in [place]  Date 11/06/22  Educator DF  Instruction Review Code 1- Verbalizes Understanding       Exercise: -Discuss the importance of exercise, FITT principles of exercise, normal and abnormal responses to exercise, and how to exercise safely.   Environmental Irritants: -Discuss types of environmental irritants and how to limit exposure to environmental  irritants. Flowsheet Row PULMONARY REHAB CHRONIC OBSTRUCTIVE PULMONARY DISEASE from 12/25/2022 in [place]  Date 11/13/22  Educator HB  Instruction Review Code 1- Verbalizes Understanding       Meds/Inhalers and oxygen: - Discuss respiratory medications, definition of an inhaler and oxygen, and the proper way to use an inhaler and oxygen. Flowsheet Row PULMONARY REHAB CHRONIC OBSTRUCTIVE PULMONARY DISEASE from 12/25/2022 in [place]  Date 11/20/22  Educator HB       Energy Saving Techniques: - Discuss methods to conserve energy and decrease shortness of breath when performing activities of daily living.  Flowsheet Row PULMONARY REHAB CHRONIC OBSTRUCTIVE PULMONARY DISEASE from 12/25/2022 in [place]  Date 11/27/22  Educator HB  Instruction Review Code 1- Verbalizes Understanding       Bronchial Hygiene / Breathing Techniques: - Discuss breathing mechanics, pursed-lip breathing technique,  proper posture, effective ways to clear airways, and other functional breathing techniques Flowsheet Row PULMONARY REHAB CHRONIC OBSTRUCTIVE PULMONARY DISEASE from 12/25/2022 in [place]  Date 12/04/22  Educator DF  Instruction Review Code 1- Verbalizes Understanding       Cleaning Equipment: - Provides group verbal and written instruction about the health risks of elevated stress, cause of high stress, and healthy ways to reduce stress.   Nutrition I: Fats: - Discuss the types of cholesterol, what cholesterol does to the body, and how cholesterol levels can be controlled. Flowsheet Row PULMONARY REHAB CHRONIC OBSTRUCTIVE PULMONARY DISEASE from 12/25/2022 in [place]  Date 12/18/22  Educator handout  Instruction Review Code 1- Verbalizes Understanding       Nutrition II: Labels: -Discuss the different components of food labels and how to read food  labels. Flowsheet Row PULMONARY REHAB CHRONIC OBSTRUCTIVE PULMONARY DISEASE from 12/25/2022 in [place]  Date 10/02/22  Educator HB  Instruction Review Code 1- Verbalizes Understanding       Respiratory Infections: - Discuss the signs and symptoms of respiratory infections, ways to prevent respiratory infections, and the importance of seeking medical treatment when having a respiratory infection.   Stress I: Signs and Symptoms: - Discuss the causes of  stress, how stress may lead to anxiety and depression, and ways to limit stress.   Stress II: Relaxation: -Discuss relaxation techniques to limit stress.   Oxygen for Home/Travel: - Discuss how to prepare for travel when on oxygen and proper ways to transport and store oxygen to ensure safety. Flowsheet Row PULMONARY REHAB CHRONIC OBSTRUCTIVE PULMONARY DISEASE from 12/25/2022 in [place]  Date 10/30/22  Educator DF  Instruction Review Code 1- Verbalizes Understanding       Knowledge Questionnaire Score:  Knowledge Questionnaire Score - 09/12/22 1405       Knowledge Questionnaire Score   Pre Score 4/18             Core Components/Risk Factors/Patient Goals at Admission:  Personal Goals and Risk Factors at Admission - 09/12/22 1424       Core Components/Risk Factors/Patient Goals on Admission    Weight Management Weight Maintenance    Improve shortness of breath with ADL's Yes    Intervention Provide education, individualized exercise plan and daily activity instruction to help decrease symptoms of SOB with activities of daily living.    Expected Outcomes Short Term: Improve cardiorespiratory fitness to achieve a reduction of symptoms when performing ADLs;[place] Term: Be able to perform more ADLs without symptoms or delay the onset of symptoms    Hypertension Yes    Intervention Provide education on lifestyle modifcations including regular physical activity/exercise,  weight management, moderate sodium restriction and increased consumption of fresh fruit, vegetables, and low fat dairy, alcohol moderation, and smoking cessation.;Monitor prescription use compliance.    Expected Outcomes Short Term: Continued assessment and intervention until BP is < 140/65m HG in hypertensive participants. < 130/829mHG in hypertensive participants with diabetes, heart failure or chronic kidney disease.;Mcmeans Term: Maintenance of blood pressure at goal levels.    Personal Goal Other Yes    Personal Goal Patient wants to be able to walk around more with less SOB and improve his endurance.    Intervention Patient will attend PR 2 days/week with exercise and education.    Expected Outcomes Patient will complete the program meeting both personal and program goals.             Core Components/Risk Factors/Patient Goals Review:   Goals and Risk Factor Review     Row Name 09/30/22 1427 10/24/22 1132 11/26/22 1445 12/22/22 0956       Core Components/Risk Factors/Patient Goals Review   Personal Goals Review Improve shortness of breath with ADL's;Develop more efficient breathing techniques such as purse lipped breathing and diaphragmatic breathing and practicing self-pacing with activity. Improve shortness of breath with ADL's;Develop more efficient breathing techniques such as purse lipped breathing and diaphragmatic breathing and practicing self-pacing with activity. (P)  Weight Management/Obesity;Hypertension;Improve shortness of breath with ADL's;Other Weight Management/Obesity;Hypertension;Improve shortness of breath with ADL's;Other    Review Patient is new to the program, completeing sessions # 3.  He is referred to PR with COPD via Dr DeShearon Stalls Patient exercises on 3L of oxygen with O2 stats running between 94%-96% while exercising on the Nu Step. We will help him with develping a efficient breathing techniques such as purse lipped breathing and practice self pacing with increased  activity.  Patient is doing well in program with progression and conisitent attendance.  We wiil encourage his progress as he continues to meet these goals.   His goals are to be able to walk with less SOB and  and improve with endurance. Patient is tolerating number 7  session well.  We will help him with develping a efficient breathing techniques such as purse lip breathing and practice self pacing with increased activity.  Patient is doing well in program with progression and conisitent attendance.  We will encourage his progress as he continues to meet these goals, of walking with less SOB and improving endurance.  Plan of care is ongoing and no further concerns as of present. (P)  Pt has completed 15 sessions in the program.  He was referred to PR due to COPD.  Pt is exercising on 2L of 02 via nasal cannula with 02 sats from 91-98%.  His current weight is 78.4 Kg up from 77.9 kg on 10/28/22.  His goals are to be able to walk and have less SOB, and he wants to improve his endurance.  We will continue to monitor his progress toward completing his goals. Pt has completed 21 sessions in the program.   Pt is exercising on 4L of 02 via nasal cannula with 02 sats from 91-98%.  His current weight is 76.8 down from last month   He is doing well in program with progression and consistent attendance.   His goals are to be able to walk and have less SOB, and he wants to improve his endurance.  Patient understands  breathing technique for purse lipped breating and self pasing with increased activity.  We will continue to monitor and encouage  his progress toward completing his goals.    Expected Outcomes Patient will complete the program meeting both program and personal goals at discharge. Patient will complete the program meeting both program and personal goals at discharge. (P)  Patient will complete the program meeting both program and personal goals at discharge. Patient will complete the program meeting both program  and personal goals at discharge.             Core Components/Risk Factors/Patient Goals at Discharge (Final Review):   Goals and Risk Factor Review - 12/22/22 0956       Core Components/Risk Factors/Patient Goals Review   Personal Goals Review Weight Management/Obesity;Hypertension;Improve shortness of breath with ADL's;Other    Review Pt has completed 21 sessions in the program.   Pt is exercising on 4L of 02 via nasal cannula with 02 sats from 91-98%.  His current weight is 76.8 down from last month   He is doing well in program with progression and consistent attendance.   His goals are to be able to walk and have less SOB, and he wants to improve his endurance.  Patient understands  breathing technique for purse lipped breating and self pasing with increased activity.  We will continue to monitor and encouage  his progress toward completing his goals.    Expected Outcomes Patient will complete the program meeting both program and personal goals at discharge.             ITP Comments:   Comments: ITP REVIEW Pt is making expected progress toward pulmonary rehab goals after completing 25 sessions. Recommend continued exercise, life style modification, education, and utilization of breathing techniques to increase stamina and strength and decrease shortness of breath with exertion.

## 2022-12-31 NOTE — Telephone Encounter (Signed)
Chase Bergeron, MD  Ramond Dial, RPH-CPP; Nuala Alpha, LPN He is a old [name] patient but he probably should. Looks like he has a strong history of smoking!  Derica Leiber, do you mind ordering ABIs for him just in case?  Thank you both!!       Previous Messages    ----- Message ----- From: Ramond Dial, RPH-CPP Sent: 12/31/2022   3:22 PM EST To: Chase Bergeron, MD  atient reports that his right leg will often times tingle and feel cold and numb.  He said it this happens especially in the evening.  This is had not been happening for years.  No pain.  Reports that his toes do turn white in both feet but the numbness and cold feeling only happens in his right. This sounds more like neuropathy to me but I did not know if he should have ABIs checked or anything?

## 2022-12-31 NOTE — Telephone Encounter (Signed)
Orders for LE Arterial US and ABIs ordered for this pt.   Will send [facility] Scheduling pool a staff message to call the pt back to arrange this appt.   Left the pt a message that LE [name] was ordered and our scheduling dept will be calling him soon to arrange this appt.

## 2023-01-01 ENCOUNTER — Encounter (HOSPITAL_COMMUNITY)
Admission: RE | Admit: 2023-01-01 | Discharge: 2023-01-01 | Disposition: A | Discharge status: Home / Self Care | Payer: Medicare Other | Source: Ambulatory Visit | Attending: Internal Medicine | Admitting: Internal Medicine

## 2023-01-01 DIAGNOSIS — J449 Chronic obstructive pulmonary disease, unspecified: Secondary | ICD-10-CM | POA: Diagnosis not present

## 2023-01-01 LAB — LDL CHOLESTEROL, DIRECT: LDL Direct: 49 mg/dL (ref 0–99)

## 2023-01-01 LAB — TSH: TSH: 7.88 u[IU]/mL — ABNORMAL HIGH (ref 0.450–4.500)

## 2023-01-01 NOTE — Progress Notes (Signed)
Daily Session Note  Patient Details  Name: Chase Lloyd MRN: [id number] Date of Birth: 1956-01-04 Referring Provider:   Flowsheet Row PULMONARY REHAB COPD ORIENTATION from 09/12/2022 in [place]  Referring Provider Dr. Shearon Stalls       Encounter Date: 01/01/2023  Check In:  Session Check In - 01/01/23 1330       Check-In   Supervising physician immediately available to respond to emergencies [facility] MD immediately available    Physician(s) Dr Harl Bowie    Location [facility]    Staff Present Leana Roe, BS, Exercise Physiologist;Debra Wynetta Emery, RN, BSN;Sarahy Creedon BSN, RN    Virtual Visit No    Medication changes reported     No    Fall or balance concerns reported    Yes    Comments Patient uses a standing walker for ambulation.    Tobacco Cessation No Change    Warm-up and Cool-down Performed as group-led instruction    Resistance Training Performed Yes    VAD Patient? No    PAD/SET Patient? No      Pain Assessment   Currently in Pain? No/denies    Pain Score 0-No pain    Multiple Pain Sites No             Capillary Blood Glucose: Results for orders placed or performed in visit on 12/31/22 (from the past 24 hour(s))  TSH     Status: Abnormal   Collection Time: 12/31/22  2:26 PM  Result Value Ref Range   TSH 7.880 (H) 0.450 - 4.500 uIU/mL   Narrative   Performed at:  [address] [place], [place]  [id number] Lab Director: Rush Farmer MD, Phone:  [phone]      Social History   Tobacco Use  Smoking Status Former   Packs/day: 1.50   Years: 47.00   Total pack years: 70.50   Types: Cigarettes   Quit date: 06/13/2018   Years since quitting: 4.5  Smokeless Tobacco Never    Goals Met:  Proper associated with RPD/PD & O2 Sat Independence with exercise equipment Using PLB without cueing & demonstrates good technique Exercise tolerated well Queuing for purse lip breathing No report  of concerns or symptoms today Strength training completed today  Goals Unmet:  Not Applicable  Comments: check out at 14:30   Dr. Kathie Dike is Medical Director for [facility].

## 2023-01-01 NOTE — Telephone Encounter (Signed)
Pt is scheduled for LE Arterial US and ABIs for 01/19/23 at 1030. Pt made aware of appt date and time by [facility] Scheduler.

## 2023-01-05 NOTE — Telephone Encounter (Signed)
Sure, no problem. 

## 2023-01-06 ENCOUNTER — Encounter (HOSPITAL_COMMUNITY)
Admission: RE | Admit: 2023-01-06 | Discharge: 2023-01-06 | Disposition: A | Discharge status: Home / Self Care | Payer: Medicare Other | Source: Ambulatory Visit | Attending: Internal Medicine | Admitting: Internal Medicine

## 2023-01-06 ENCOUNTER — Telehealth: Payer: Self-pay | Admitting: Pharmacist

## 2023-01-06 DIAGNOSIS — J449 Chronic obstructive pulmonary disease, unspecified: Secondary | ICD-10-CM | POA: Diagnosis not present

## 2023-01-06 NOTE — Progress Notes (Signed)
Daily Session Note  Patient Details  Name: Chase Lloyd MRN: [id number] Date of Birth: [birth year] Referring Provider:   Flowsheet Row PULMONARY REHAB COPD ORIENTATION from 09/12/2022 in [place]  Referring Provider Dr. Shearon Stalls       Encounter Date: 01/06/2023  Check In:  Session Check In - 01/06/23 1330       Check-In   Supervising physician immediately available to respond to emergencies [facility] MD immediately available    Physician(s) Dr Harrington Challenger    Location [facility]    Staff Present Leana Roe, BS, Exercise Physiologist;Phyllis Billingsley, RN;Antia Rahal Hassell Done, RN, BSN    Virtual Visit No    Medication changes reported     No    Fall or balance concerns reported    Yes    Comments Patient uses a standing walker for ambulation.    Tobacco Cessation No Change    Warm-up and Cool-down Performed as group-led instruction    Resistance Training Performed Yes    VAD Patient? No    PAD/SET Patient? No      Pain Assessment   Currently in Pain? No/denies    Pain Score 0-No pain    Multiple Pain Sites No             Capillary Blood Glucose: No results found for this or any previous visit (from the past 24 hour(s)).    Social History   Tobacco Use  Smoking Status Former   Packs/day: 1.50   Years: 47.00   Total pack years: 70.50   Types: Cigarettes   Quit date: 06/13/2018   Years since quitting: 4.5  Smokeless Tobacco Never    Goals Met:  Proper associated with RPD/PD & O2 Sat Independence with exercise equipment Using PLB without cueing & demonstrates good technique Exercise tolerated well Queuing for purse lip breathing No report of concerns or symptoms today Strength training completed today  Goals Unmet:  Not Applicable  Comments: Checkout at 1430.   Dr. Kathie Dike is Medical Director for [facility].

## 2023-01-06 NOTE — Telephone Encounter (Signed)
LDL-C at goal. No need for any other medications. continue rosuvastatin '10mg'$  daily.

## 2023-01-08 ENCOUNTER — Encounter (HOSPITAL_COMMUNITY): Payer: Self-pay | Admitting: Internal Medicine

## 2023-01-08 ENCOUNTER — Encounter (HOSPITAL_COMMUNITY)
Admission: RE | Admit: 2023-01-08 | Discharge: 2023-01-08 | Disposition: A | Discharge status: Home / Self Care | Payer: Medicare Other | Source: Ambulatory Visit | Attending: Internal Medicine | Admitting: Internal Medicine

## 2023-01-08 ENCOUNTER — Other Ambulatory Visit: Payer: Self-pay | Admitting: Adult Health

## 2023-01-08 DIAGNOSIS — J449 Chronic obstructive pulmonary disease, unspecified: Secondary | ICD-10-CM | POA: Diagnosis not present

## 2023-01-08 NOTE — Progress Notes (Signed)
Attempted to obtain medical history via telephone, unable to reach at this time. HIPAA compliant voicemail message left requesting return call to pre surgical testing department. 

## 2023-01-08 NOTE — Telephone Encounter (Signed)
Okay for refill?  

## 2023-01-08 NOTE — Progress Notes (Signed)
Daily Session Note  Patient Details  Name: Chase Lloyd MRN: [id number] Date of Birth: [birth year] Referring Provider:   Flowsheet Row PULMONARY REHAB COPD ORIENTATION from 09/12/2022 in [place]  Referring Provider Dr. Shearon Stalls       Encounter Date: 01/08/2023  Check In:  Session Check In - 01/08/23 1330       Check-In   Supervising physician immediately available to respond to emergencies [facility] MD immediately available    Physician(s) Dr. Harl Bowie    Location [facility]    Staff Present Leana Roe, BS, Exercise Physiologist;Dalton Sherrie George, MS, ACSM-CEP;Melven Sartorius BSN, RN    Virtual Visit No    Medication changes reported     No    Fall or balance concerns reported    Yes    Comments Patient uses a standing walker for ambulation.    Tobacco Cessation No Change    Warm-up and Cool-down Performed as group-led instruction    Resistance Training Performed Yes    VAD Patient? No    PAD/SET Patient? No      Pain Assessment   Currently in Pain? No/denies    Pain Score 0-No pain    Multiple Pain Sites No             Capillary Blood Glucose: No results found for this or any previous visit (from the past 24 hour(s)).    Social History   Tobacco Use  Smoking Status Former   Packs/day: 1.50   Years: 47.00   Total pack years: 70.50   Types: Cigarettes   Quit date: 06/13/2018   Years since quitting: 4.5  Smokeless Tobacco Never    Goals Met:  Proper associated with RPD/PD & O2 Sat Independence with exercise equipment Using PLB without cueing & demonstrates good technique Exercise tolerated well Queuing for purse lip breathing No report of concerns or symptoms today Strength training completed today  Goals Unmet:  Not Applicable  Comments: check out at 14:30   Dr. Kathie Dike is Medical Director for [facility].

## 2023-01-09 NOTE — Telephone Encounter (Signed)
Pt notified pf message below. Pt will call back and schedule appt.

## 2023-01-13 ENCOUNTER — Encounter (HOSPITAL_COMMUNITY)
Admission: RE | Admit: 2023-01-13 | Discharge: 2023-01-13 | Disposition: A | Discharge status: Home / Self Care | Payer: Medicare Other | Source: Ambulatory Visit | Attending: Internal Medicine | Admitting: Internal Medicine

## 2023-01-13 VITALS — Ht 71.0 in | Wt 173.9 lb

## 2023-01-13 DIAGNOSIS — J449 Chronic obstructive pulmonary disease, unspecified: Secondary | ICD-10-CM

## 2023-01-13 NOTE — Progress Notes (Signed)
Daily Session Note  Patient Details  Name: Chase Lloyd MRN: [id number] Date of Birth: Oct 02, 1956 Referring Provider:   Flowsheet Row PULMONARY REHAB COPD ORIENTATION from 09/12/2022 in [place]  Referring Provider Dr. Shearon Stalls       Encounter Date: 01/13/2023  Check In:  Session Check In - 01/13/23 1335       Check-In   Supervising physician immediately available to respond to emergencies [facility] MD immediately available    Physician(s) Dr Dellia Cloud    Location [facility]    Staff Present Leana Roe, BS, Exercise Physiologist;Phyllis Billingsley, RN;Mordecai Tindol Hassell Done, RN, BSN    Virtual Visit No    Medication changes reported     No    Fall or balance concerns reported    Yes    Comments Patient uses a standing walker for ambulation.    Tobacco Cessation No Change    Warm-up and Cool-down Performed as group-led instruction    Resistance Training Performed Yes    VAD Patient? No    PAD/SET Patient? No      Pain Assessment   Currently in Pain? No/denies    Pain Score 0-No pain    Multiple Pain Sites No             Capillary Blood Glucose: No results found for this or any previous visit (from the past 24 hour(s)).    Social History   Tobacco Use  Smoking Status Former   Packs/day: 1.50   Years: 47.00   Total pack years: 70.50   Types: Cigarettes   Quit date: 06/13/2018   Years since quitting: 4.5  Smokeless Tobacco Never    Goals Met:  Proper associated with RPD/PD & O2 Sat Independence with exercise equipment Using PLB without cueing & demonstrates good technique Exercise tolerated well Queuing for purse lip breathing No report of concerns or symptoms today Strength training completed today  Goals Unmet:  Not Applicable  Comments: Checkout at 1430.   Dr. Kathie Dike is Medical Director for [facility].

## 2023-01-15 ENCOUNTER — Encounter (HOSPITAL_COMMUNITY): Payer: Self-pay | Admitting: Internal Medicine

## 2023-01-15 ENCOUNTER — Encounter (HOSPITAL_COMMUNITY)
Admission: RE | Disposition: A | Discharge status: Home / Self Care | Payer: Self-pay | Source: Ambulatory Visit | Attending: Internal Medicine

## 2023-01-15 ENCOUNTER — Ambulatory Visit (HOSPITAL_COMMUNITY): Payer: Medicare Other | Admitting: Anesthesiology

## 2023-01-15 ENCOUNTER — Ambulatory Visit (HOSPITAL_COMMUNITY)
Admission: RE | Admit: 2023-01-15 | Discharge: 2023-01-15 | Disposition: A | Discharge status: Home / Self Care | Payer: Medicare Other | Source: Ambulatory Visit | Attending: Internal Medicine | Admitting: Internal Medicine

## 2023-01-15 ENCOUNTER — Ambulatory Visit (HOSPITAL_BASED_OUTPATIENT_CLINIC_OR_DEPARTMENT_OTHER): Payer: Medicare Other | Admitting: Anesthesiology

## 2023-01-15 ENCOUNTER — Encounter (HOSPITAL_COMMUNITY): Payer: Medicare Other

## 2023-01-15 ENCOUNTER — Other Ambulatory Visit: Payer: Self-pay

## 2023-01-15 DIAGNOSIS — J439 Emphysema, unspecified: Secondary | ICD-10-CM | POA: Insufficient documentation

## 2023-01-15 DIAGNOSIS — D123 Benign neoplasm of transverse colon: Secondary | ICD-10-CM

## 2023-01-15 DIAGNOSIS — Z9981 Dependence on supplemental oxygen: Secondary | ICD-10-CM | POA: Insufficient documentation

## 2023-01-15 DIAGNOSIS — I10 Essential (primary) hypertension: Secondary | ICD-10-CM | POA: Insufficient documentation

## 2023-01-15 DIAGNOSIS — I2510 Atherosclerotic heart disease of native coronary artery without angina pectoris: Secondary | ICD-10-CM | POA: Diagnosis not present

## 2023-01-15 DIAGNOSIS — F32A Depression, unspecified: Secondary | ICD-10-CM | POA: Diagnosis not present

## 2023-01-15 DIAGNOSIS — R1310 Dysphagia, unspecified: Secondary | ICD-10-CM | POA: Insufficient documentation

## 2023-01-15 DIAGNOSIS — K2289 Other specified disease of esophagus: Secondary | ICD-10-CM | POA: Diagnosis not present

## 2023-01-15 DIAGNOSIS — K3189 Other diseases of stomach and duodenum: Secondary | ICD-10-CM

## 2023-01-15 DIAGNOSIS — K219 Gastro-esophageal reflux disease without esophagitis: Secondary | ICD-10-CM | POA: Insufficient documentation

## 2023-01-15 DIAGNOSIS — Z1211 Encounter for screening for malignant neoplasm of colon: Secondary | ICD-10-CM | POA: Diagnosis not present

## 2023-01-15 DIAGNOSIS — Z87891 Personal history of nicotine dependence: Secondary | ICD-10-CM | POA: Insufficient documentation

## 2023-01-15 DIAGNOSIS — K319 Disease of stomach and duodenum, unspecified: Secondary | ICD-10-CM | POA: Diagnosis not present

## 2023-01-15 DIAGNOSIS — K2980 Duodenitis without bleeding: Secondary | ICD-10-CM

## 2023-01-15 DIAGNOSIS — J449 Chronic obstructive pulmonary disease, unspecified: Secondary | ICD-10-CM | POA: Diagnosis not present

## 2023-01-15 DIAGNOSIS — K648 Other hemorrhoids: Secondary | ICD-10-CM

## 2023-01-15 DIAGNOSIS — M1990 Unspecified osteoarthritis, unspecified site: Secondary | ICD-10-CM | POA: Diagnosis not present

## 2023-01-15 DIAGNOSIS — R1013 Epigastric pain: Secondary | ICD-10-CM | POA: Diagnosis not present

## 2023-01-15 HISTORY — PX: BIOPSY: SHX5522

## 2023-01-15 HISTORY — PX: COLONOSCOPY WITH PROPOFOL: SHX5780

## 2023-01-15 HISTORY — PX: POLYPECTOMY: SHX5525

## 2023-01-15 HISTORY — PX: ESOPHAGOGASTRODUODENOSCOPY (EGD) WITH PROPOFOL: SHX5813

## 2023-01-15 SURGERY — ESOPHAGOGASTRODUODENOSCOPY (EGD) WITH PROPOFOL
Anesthesia: Monitor Anesthesia Care

## 2023-01-15 MED ORDER — PROPOFOL 500 MG/50ML IV EMUL
INTRAVENOUS | Status: DC | PRN
  Administered 2023-01-15: 125 ug/kg/min via INTRAVENOUS

## 2023-01-15 MED ORDER — PHENYLEPHRINE 80 MCG/ML (10ML) SYRINGE FOR IV PUSH (FOR BLOOD PRESSURE SUPPORT)
PREFILLED_SYRINGE | INTRAVENOUS | Status: DC | PRN
  Administered 2023-01-15: 160 ug via INTRAVENOUS

## 2023-01-15 MED ORDER — PROPOFOL 1000 MG/100ML IV EMUL
INTRAVENOUS | Status: AC
  Filled 2023-01-15: qty 100

## 2023-01-15 MED ORDER — LIDOCAINE 2% (20 MG/ML) 5 ML SYRINGE
INTRAMUSCULAR | Status: DC | PRN
  Administered 2023-01-15: 100 mg via INTRAVENOUS

## 2023-01-15 MED ORDER — SODIUM CHLORIDE 0.9 % IV SOLN: INTRAVENOUS | Status: DC

## 2023-01-15 MED ORDER — PROPOFOL 10 MG/ML IV BOLUS
INTRAVENOUS | Status: DC | PRN
  Administered 2023-01-15 (×3): 20 mg via INTRAVENOUS

## 2023-01-15 SURGICAL SUPPLY — 25 items

## 2023-01-15 NOTE — H&P (Signed)
GASTROENTEROLOGY PROCEDURE H&P NOTE   Primary Care Physician: [name], NP    Reason for Procedure:   Colon cancer screening, dysphagia  Plan:    EGD/colonoscopy  Patient is appropriate for endoscopic procedure(s) in the hospital setting.  The nature of the procedure, as well as the risks, benefits, and alternatives were carefully and thoroughly reviewed with the patient. Ample time for discussion and questions allowed. The patient understood, was satisfied, and agreed to proceed.     HPI: Chase Lloyd is a 67 y.o. male who presents for EGD/colonoscopy for evaluation of dysphagia, colon cancer screening .  Patient was most recently seen in the Gastroenterology Clinic on 09/05/22.  No interval change in medical history since that appointment. Please refer to that note for full details regarding GI history and clinical presentation.   Past Medical History:  Diagnosis Date   Arthritis    Back pain    COPD (chronic obstructive pulmonary disease) (HCC)    DDD (degenerative disc disease), lumbar    Depression    Emphysema of lung ([place])    Empyema ([place])    2016  (was in [place])   GERD (gastroesophageal reflux disease)    Hearing loss of right ear    started 2001.     microphone in right ear ,  and hearing aid in left   Hodgkin's disease ([place]) 1999   Hypotension    Meniere's disease    Pneumonia    Tinnitus     Past Surgical History:  Procedure Laterality Date   BACK SURGERY     CERVICAL FUSION  2016   ELBOW SURGERY     EYE SURGERY     IR THORACENTESIS ASP PLEURAL SPACE W/IMG GUIDE  10/08/2018   LAMINECTOMY  1999   lower back surgery     RIGHT/LEFT HEART CATH AND CORONARY ANGIOGRAPHY N/A 04/29/2019   Procedure: RIGHT/LEFT HEART CATH AND CORONARY ANGIOGRAPHY;  Surgeon: Troy Sine, MD;  Location: [facility];  Service: Cardiovascular;  Laterality: N/A;   TONSILLECTOMY      Prior to Admission medications   Medication Sig Start Date End Date Taking?  Authorizing Provider  albuterol (PROVENTIL) (2.5 MG/3ML) 0.083% nebulizer solution Take 3 mLs (2.5 mg total) by nebulization every 4 (four) hours as needed for wheezing or shortness of breath. 10/20/22  Yes Margaretha Seeds, MD  albuterol (VENTOLIN HFA) 108 (90 Base) MCG/ACT inhaler Inhale 2 puffs into the lungs every 6 (six) hours as needed for wheezing or shortness of breath. 12/22/22  Yes Margaretha Seeds, MD  aspirin 81 MG chewable tablet Chew 81 mg by mouth daily.   Yes [provider]  Budeson-Glycopyrrol-Formoterol (BREZTRI AEROSPHERE) 160-9-4.8 MCG/ACT AERO Inhale 2 puffs into the lungs 2 (two) times daily. 12/22/22  Yes Margaretha Seeds, MD  furosemide (LASIX) 20 MG tablet Take 20 mg by mouth daily.   Yes [provider]  ibuprofen (ADVIL) 800 MG tablet TAKE 1 TABLET BY MOUTH EVERY 8 HOURS AS NEEDED 01/09/23  Yes Nafziger, Tommi Rumps, NP  metoprolol succinate (TOPROL-XL) 25 MG 24 hr tablet Take 1 tablet (25 mg total) by mouth daily. 10/16/22  Yes Belva Crome, MD  omeprazole (PRILOSEC) 40 MG capsule Take 1 capsule by mouth twice daily 11/05/22  Yes Nafziger, Tommi Rumps, NP  rosuvastatin (CRESTOR) 10 MG tablet Take 1 tablet (10 mg total) by mouth daily. 10/16/22  Yes Belva Crome, MD  sodium chloride (OCEAN) 0.65 % SOLN nasal spray  Place 1 spray into both nostrils as needed for congestion.   Yes [provider]    No current facility-administered medications for this encounter.    Allergies as of 12/12/2022   (No Known Allergies)    Family History  Problem Relation Age of Onset   Hypertension Father    Diabetes Father    Testicular cancer Brother    Melanoma Brother     Social History   Socioeconomic History   Marital status: Married    Spouse name: Not on file   Number of children: Not on file   Years of education: Not on file   Highest education level: Not on file  Occupational History   Occupation: Retired  Tobacco Use   Smoking status: Former     Packs/day: 1.50    Years: 47.00    Additional pack years: 0.00    Total pack years: 70.50    Types: Cigarettes    Quit date: 06/13/2018    Years since quitting: 4.5   Smokeless tobacco: Never  Vaping Use   Vaping Use: Never used  Substance and Sexual Activity   Alcohol use: Yes    Comment: 24 cans in a week   Drug use: No   Sexual activity: Not on file  Other Topics Concern   Not on file  Social History Narrative   Retied - worked as Furniture conservator/restorer       Social Determinants of Radio broadcast assistant Strain: [place]  (11/13/2022)   Overall Financial Resource Strain (CARDIA)    Difficulty of Paying Living Expenses: Not hard at all  Food Insecurity: No Food Insecurity (11/13/2022)   Hunger Vital Sign    Worried About Running Out of Food in the Last Year: Never true    [place] in the Last Year: Never true  Transportation Needs: No Transportation Needs (11/13/2022)   PRAPARE - Hydrologist (Medical): No    Lack of Transportation (Non-Medical): No  Physical Activity: Insufficiently Active (11/13/2022)   Exercise Vital Sign    Days of Exercise per Week: 2 days    Minutes of Exercise per Session: 60 min  Stress: No Stress Concern Present (11/13/2022)   [place]    Feeling of Stress : Not at all  Social Connections: Moderately Isolated (11/13/2022)   Social Connection and Isolation Panel [NHANES]    Frequency of Communication with Friends and Family: More than three times a week    Frequency of Social Gatherings with Friends and Family: More than three times a week    Attends Religious Services: Never    Marine scientist or Organizations: No    Attends Archivist Meetings: Never    Marital Status: Married  Human resources officer Violence: Not At Risk (11/13/2022)   Humiliation, Afraid, Rape, and Kick questionnaire    Fear of Current or Ex-Partner: No    Emotionally  Abused: No    Physically Abused: No    Sexually Abused: No    Physical Exam: Vital signs in last 24 hours: BP (!) 169/61   Pulse 94   Temp (!) 97.4 F (36.3 C) (Temporal)   Resp (!) 21   Ht '5\' 11"'$  (1.803 m)   Wt 78.9 kg   SpO2 93% Comment: pt states this is normal for him. hx copd  BMI 24.27 kg/m  GEN: NAD EYE: Sclerae anicteric ENT: MMM CV: Non-tachycardic Pulm:  No increased WOB GI: Soft NEURO:  Alert & Oriented   Christia Reading, MD [facility]   01/15/2023 8:29 AM

## 2023-01-15 NOTE — Transfer of Care (Signed)
Immediate Anesthesia Transfer of Care Note  Patient: Chase Lloyd  Procedure(s) Performed: ESOPHAGOGASTRODUODENOSCOPY (EGD) WITH PROPOFOL COLONOSCOPY WITH PROPOFOL BIOPSY POLYPECTOMY  Patient Location: Endoscopy Unit  Anesthesia Type:MAC  Level of Consciousness: awake, alert  and oriented  Airway & Oxygen Therapy: Patient Spontanous Breathing and Patient connected to nasal cannula oxygen  Post-op Assessment: Report given to RN and Post -op Vital signs reviewed and stable  Post vital signs: Reviewed and stable  Last Vitals:  Vitals Value Taken Time  BP    Temp    Pulse    Resp    SpO2      Last Pain:  Vitals:   01/15/23 0814  TempSrc: Temporal  PainSc: 0-No pain         Complications: No notable events documented.

## 2023-01-15 NOTE — Anesthesia Postprocedure Evaluation (Signed)
Anesthesia Post Note  Patient: Tazz Burgos Johnson  Procedure(s) Performed: ESOPHAGOGASTRODUODENOSCOPY (EGD) WITH PROPOFOL COLONOSCOPY WITH PROPOFOL BIOPSY POLYPECTOMY     Patient location during evaluation: Endoscopy Anesthesia Type: MAC Level of consciousness: awake and alert Pain management: pain level controlled Vital Signs Assessment: post-procedure vital signs reviewed and stable Respiratory status: spontaneous breathing, nonlabored ventilation, respiratory function stable and patient connected to nasal cannula oxygen Cardiovascular status: stable and blood pressure returned to baseline Postop Assessment: no apparent nausea or vomiting Anesthetic complications: no   No notable events documented.  Last Vitals:  Vitals:   01/15/23 1045 01/15/23 1050  BP: (!) 153/51 (!) 155/50  Pulse: 87 86  Resp: 20 18  Temp:    SpO2: 100% 100%    Last Pain:  Vitals:   01/15/23 1050  TempSrc:   PainSc: 0-No pain                 Santa Lighter

## 2023-01-15 NOTE — Anesthesia Preprocedure Evaluation (Signed)
Anesthesia Evaluation  Patient identified by MRN, date of birth, ID band Patient awake    Reviewed: Allergy & Precautions, NPO status , Patient's Chart, lab work & pertinent test results, reviewed documented beta blocker date and time   Airway Mallampati: II  TM Distance: >3 FB Neck ROM: Full    Dental  (+) Dental Advisory Given, Edentulous Upper, Edentulous Lower   Pulmonary COPD,  COPD inhaler and oxygen dependent, former smoker   Pulmonary exam normal breath sounds clear to auscultation       Cardiovascular hypertension, Pt. on home beta blockers (-) angina + CAD  Normal cardiovascular exam Rhythm:Regular Rate:Normal     Neuro/Psych  PSYCHIATRIC DISORDERS  Depression    negative neurological ROS     GI/Hepatic Neg liver ROS,GERD  Medicated,,  Endo/Other  negative endocrine ROS    Renal/GU negative Renal ROS     Musculoskeletal  (+) Arthritis ,    Abdominal   Peds  Hematology negative hematology ROS (+)   Anesthesia Other Findings Day of surgery medications reviewed with the patient.  Reproductive/Obstetrics                             Anesthesia Physical Anesthesia Plan  ASA: 4  Anesthesia Plan: MAC   Post-op Pain Management:    Induction: Intravenous  PONV Risk Score and Plan: 1 and TIVA and Treatment may vary due to age or medical condition  Airway Management Planned: Natural Airway and Simple Face Mask  Additional Equipment:   Intra-op Plan:   Post-operative Plan:   Informed Consent: I have reviewed the patients History and Physical, chart, labs and discussed the procedure including the risks, benefits and alternatives for the proposed anesthesia with the patient or authorized representative who has indicated his/her understanding and acceptance.     Dental advisory given  Plan Discussed with: CRNA and Anesthesiologist  Anesthesia Plan Comments:         Anesthesia Quick Evaluation

## 2023-01-15 NOTE — Progress Notes (Signed)
Discharge Progress Report  Patient Details  Name: Chase Lloyd MRN: [id number] Date of Birth: 1956-03-21 Referring Provider:   Flowsheet Row PULMONARY REHAB COPD ORIENTATION from 09/12/2022 in [place]  Referring Provider Dr. Shearon Stalls        Number of Visits: 29  Reason for Discharge:  Patient reached a stable level of exercise. Patient independent in their exercise. Patient has met program and personal goals.  Smoking History:  Social History   Tobacco Use  Smoking Status Former   Packs/day: 1.50   Years: 47.00   Additional pack years: 0.00   Total pack years: 70.50   Types: Cigarettes   Quit date: 06/13/2018   Years since quitting: 4.5  Smokeless Tobacco Never    Diagnosis:  Stage 4 very severe COPD by GOLD classification ([place])  ADL UCSD:  Pulmonary Assessment Scores     Row Name 09/12/22 1422 01/15/23 0926       ADL UCSD   ADL Phase Entry --    SOB Score total 66 60    Rest 1 0    Walk 2 2    Stairs 4 4    Bath 3 1    Dress 2 1    Shop 1 1      CAT Score   CAT Score 30 22      mMRC Score   mMRC Score 3 3             Initial Exercise Prescription:  Initial Exercise Prescription - 09/12/22 1400       Date of Initial Exercise RX and Referring Provider   Date 09/12/22    Referring Provider Dr. Shearon Stalls    Expected Discharge Date 01/15/23      Oxygen   Oxygen Continuous    Liters 2    Maintain Oxygen Saturation 88% or higher      NuStep   Level 1    SPM 60    Minutes 17      Arm Ergometer   Level 1    RPM 50    Minutes 22      Prescription Details   Frequency (times per week) 2    Duration Progress to 30 minutes of continuous aerobic without signs/symptoms of physical distress      Intensity   THRR 40-80% of Max Heartrate 62-123    Ratings of Perceived Exertion 11-13    Perceived Dyspnea 0-4      Resistance Training   Training Prescription Yes    Weight 3    Reps 10-15             Discharge  Exercise Prescription (Final Exercise Prescription Changes):  Exercise Prescription Changes - 12/30/22 1400       Response to Exercise   Blood Pressure (Admit) 142/52    Blood Pressure (Exercise) 140/50    Blood Pressure (Exit) 124/50    Heart Rate (Admit) 86 bpm    Heart Rate (Exercise) 89 bpm    Heart Rate (Exit) 82 bpm    Oxygen Saturation (Admit) 96 %    Oxygen Saturation (Exercise) 96 %    Oxygen Saturation (Exit) 99 %    Rating of Perceived Exertion (Exercise) 12    Perceived Dyspnea (Exercise) 12    Duration Continue with 30 min of aerobic exercise without signs/symptoms of physical distress.    Intensity THRR unchanged      Progression   Progression Continue to progress workloads to maintain intensity without  signs/symptoms of physical distress.      Resistance Training   Training Prescription Yes    Weight 5    Reps 10-15    Time 10 Minutes      Oxygen   Oxygen Continuous    Liters 4      NuStep   Level 3    SPM 74    Minutes 39    METs 1.8             Functional Capacity:  6 Minute Walk     Row Name 09/12/22 1413 01/13/23 1413 01/13/23 1418     6 Minute Walk   Phase Initial Discharge Discharge   Distance 850 feet 825 feet --   Walk Time 6 minutes 6 minutes --   # of Rest Breaks 0 0 --   MPH 1.61 1.56 --   METS 3.07 2.64 --   RPE 12 11 --   Perceived Dyspnea  13 11 --   VO2 Peak 10.76 9.25 --   Symptoms No No --   Resting HR 95 bpm 86 bpm --   Resting BP 180/60 140/40 --   Resting Oxygen Saturation  97 % 97 % --   Exercise Oxygen Saturation  during 6 min walk 90 % 95 % --   Max Ex. HR 114 bpm 100 bpm --   Max Ex. BP 186/54 154/40 --   2 Minute Post BP 168/50 134/40 --     Interval HR   1 Minute HR 102 97 --   2 Minute HR 107 100 --   3 Minute HR 110 99 --   4 Minute HR 112 98 --   5 Minute HR 114 99 --   6 Minute HR 114 97 --   2 Minute Post HR 107 87 --   Interval Heart Rate? Yes Yes --     Interval Oxygen   Interval Oxygen? Yes  Yes --   Baseline Oxygen Saturation % 97 % 97 % --   1 Minute Oxygen Saturation % 92 % 94 % --   1 Minute Liters of Oxygen 2 L 4 L --   2 Minute Oxygen Saturation % 92 % 96 % --   2 Minute Liters of Oxygen 2 L 4 L --   3 Minute Oxygen Saturation % 90 % 96 % --   3 Minute Liters of Oxygen 2 L 4 L --   4 Minute Oxygen Saturation % 91 % 95 % --   4 Minute Liters of Oxygen 2 L 4 L --   5 Minute Oxygen Saturation % 91 % 95 % --   5 Minute Liters of Oxygen 2 L 4 L --   6 Minute Oxygen Saturation % 91 % 95 % --   6 Minute Liters of Oxygen 2 L 4 L --   2 Minute Post Oxygen Saturation % 95 % 97 % --   2 Minute Post Liters of Oxygen 2 L 4 L --            Psychological, QOL, Others - Outcomes: PHQ 2/9:    01/15/2023    9:29 AM 11/13/2022    9:07 AM 09/12/2022    2:06 PM 11/12/2021    9:11 AM 10/23/2020   11:14 AM  Depression screen PHQ 2/9  Decreased Interest 1 0 0 0 0  Down, Depressed, Hopeless 0 0 0 0 0  PHQ - 2 Score 1 0 0 0 0  Altered sleeping  0  3    Tired, decreased energy 1  3    Change in appetite 0  3    Feeling bad or failure about yourself  1  3    Trouble concentrating 0  0    Moving slowly or fidgety/restless 0  0    Suicidal thoughts 0  0    PHQ-9 Score 3  12    Difficult doing work/chores Somewhat difficult  Extremely dIfficult      Quality of Life:  Quality of Life - 01/13/23 1417       Quality of Life Scores   Health/Function Pre 6.72 %    Health/Function Post 9.83 %    Health/Function % Change 46.28 %    Socioeconomic Pre 20.2 %    Socioeconomic Post 27.5 %    Socioeconomic % Change  36.14 %    Psych/Spiritual Pre 7.79 %    Psych/Spiritual Post 14.58 %    Psych/Spiritual % Change 87.16 %    Family Pre 27.1 %    Family Post 30 %    Family % Change 10.7 %    GLOBAL Pre 12.08 %    GLOBAL Post 16.85 %    GLOBAL % Change 39.49 %             Personal Goals: Goals established at orientation with interventions provided to work toward goal.   Personal Goals and Risk Factors at Admission - 09/12/22 1424       Core Components/Risk Factors/Patient Goals on Admission    Weight Management Weight Maintenance    Improve shortness of breath with ADL's Yes    Intervention Provide education, individualized exercise plan and daily activity instruction to help decrease symptoms of SOB with activities of daily living.    Expected Outcomes Short Term: Improve cardiorespiratory fitness to achieve a reduction of symptoms when performing ADLs;Glore Term: Be able to perform more ADLs without symptoms or delay the onset of symptoms    Hypertension Yes    Intervention Provide education on lifestyle modifcations including regular physical activity/exercise, weight management, moderate sodium restriction and increased consumption of fresh fruit, vegetables, and low fat dairy, alcohol moderation, and smoking cessation.;Monitor prescription use compliance.    Expected Outcomes Short Term: Continued assessment and intervention until BP is < 140/85m HG in hypertensive participants. < 130/839mHG in hypertensive participants with diabetes, heart failure or chronic kidney disease.;Derick Term: Maintenance of blood pressure at goal levels.    Personal Goal Other Yes    Personal Goal Patient wants to be able to walk around more with less SOB and improve his endurance.    Intervention Patient will attend PR 2 days/week with exercise and education.    Expected Outcomes Patient will complete the program meeting both personal and program goals.              Personal Goals Discharge:  Goals and Risk Factor Review     Row Name 09/30/22 1427 10/24/22 1132 11/26/22 1445 12/22/22 0956 01/15/23 0940     Core Components/Risk Factors/Patient Goals Review   Personal Goals Review Improve shortness of breath with ADL's;Develop more efficient breathing techniques such as purse lipped breathing and diaphragmatic breathing and practicing self-pacing with activity. Improve  shortness of breath with ADL's;Develop more efficient breathing techniques such as purse lipped breathing and diaphragmatic breathing and practicing self-pacing with activity. (P)  Weight Management/Obesity;Hypertension;Improve shortness of breath with ADL's;Other Weight Management/Obesity;Hypertension;Improve shortness of breath with ADL's;Other Weight Management/Obesity;Hypertension;Improve shortness of breath  with ADL's;Other   Review Patient is new to the program, completeing sessions # 3.  He is referred to PR with COPD via Dr Shearon Stalls.  Patient exercises on 3L of oxygen with O2 stats running between 94%-96% while exercising on the Nu Step. We will help him with develping a efficient breathing techniques such as purse lipped breathing and practice self pacing with increased activity.  Patient is doing well in program with progression and conisitent attendance.  We wiil encourage his progress as he continues to meet these goals.   His goals are to be able to walk with less SOB and  and improve with endurance. Patient is tolerating number 7 session well.  We will help him with develping a efficient breathing techniques such as purse lip breathing and practice self pacing with increased activity.  Patient is doing well in program with progression and conisitent attendance.  We will encourage his progress as he continues to meet these goals, of walking with less SOB and improving endurance.  Plan of care is ongoing and no further concerns as of present. (P)  Pt has completed 15 sessions in the program.  He was referred to PR due to COPD.  Pt is exercising on 2L of 02 via nasal cannula with 02 sats from 91-98%.  His current weight is 78.4 Kg up from 77.9 kg on 10/28/22.  His goals are to be able to walk and have less SOB, and he wants to improve his endurance.  We will continue to monitor his progress toward completing his goals. Pt has completed 21 sessions in the program.   Pt is exercising on 4L of 02 via nasal  cannula with 02 sats from 91-98%.  His current weight is 76.8 down from last month   He is doing well in program with progression and consistent attendance.   His goals are to be able to walk and have less SOB, and he wants to improve his endurance.  Patient understands  breathing technique for purse lipped breating and self pasing with increased activity.  We will continue to monitor and encouage  his progress toward completing his goals. Pt graduated after 29 sessions. His weight was stable while he was in the program. His O2 sats were WNLs while using 4L of O2 via nasal cannula. His attendance was consistent. He is limited by his severe osteoarthritis in his hips. Due to this, he has to walk with a high walker and is a high fall risk due to his inability to hold his weight up. He was frustrated with staff that we would not let him use the TM. We explained to him multiple times that it would not be safe for him to use the TM. His HR and BP were elevated many times throughout the program. He does report that his SOB has improved and his SOB form and CAT form show that his SOB has decreased.   Expected Outcomes Patient will complete the program meeting both program and personal goals at discharge. Patient will complete the program meeting both program and personal goals at discharge. (P)  Patient will complete the program meeting both program and personal goals at discharge. Patient will complete the program meeting both program and personal goals at discharge. Pt will continue to work towards their personal goals post discharge.            Exercise Goals and Review:  Exercise Goals     Row Name 09/12/22 1417 10/07/22 1439 11/04/22 1547 12/02/22  1512 12/30/22 1437     Exercise Goals   Increase Physical Activity Yes Yes Yes Yes Yes   Intervention Provide advice, education, support and counseling about physical activity/exercise needs.;Develop an individualized exercise prescription for aerobic and  resistive training based on initial evaluation findings, risk stratification, comorbidities and participant's personal goals. Provide advice, education, support and counseling about physical activity/exercise needs.;Develop an individualized exercise prescription for aerobic and resistive training based on initial evaluation findings, risk stratification, comorbidities and participant's personal goals. Provide advice, education, support and counseling about physical activity/exercise needs.;Develop an individualized exercise prescription for aerobic and resistive training based on initial evaluation findings, risk stratification, comorbidities and participant's personal goals. Provide advice, education, support and counseling about physical activity/exercise needs.;Develop an individualized exercise prescription for aerobic and resistive training based on initial evaluation findings, risk stratification, comorbidities and participant's personal goals. Provide advice, education, support and counseling about physical activity/exercise needs.;Develop an individualized exercise prescription for aerobic and resistive training based on initial evaluation findings, risk stratification, comorbidities and participant's personal goals.   Expected Outcomes Short Term: Attend rehab on a regular basis to increase amount of physical activity.;Sisemore Term: Add in home exercise to make exercise part of routine and to increase amount of physical activity.;Candela Term: Exercising regularly at least 3-5 days a week. Short Term: Attend rehab on a regular basis to increase amount of physical activity.;Rowton Term: Add in home exercise to make exercise part of routine and to increase amount of physical activity.;Espy Term: Exercising regularly at least 3-5 days a week. Short Term: Attend rehab on a regular basis to increase amount of physical activity.;Lacey Term: Add in home exercise to make exercise part of routine and to increase amount of  physical activity.;Marksberry Term: Exercising regularly at least 3-5 days a week. Short Term: Attend rehab on a regular basis to increase amount of physical activity.;Dewilde Term: Add in home exercise to make exercise part of routine and to increase amount of physical activity.;Duet Term: Exercising regularly at least 3-5 days a week. Short Term: Attend rehab on a regular basis to increase amount of physical activity.;Doverspike Term: Add in home exercise to make exercise part of routine and to increase amount of physical activity.;Shaddix Term: Exercising regularly at least 3-5 days a week.   Increase Strength and Stamina Yes Yes Yes Yes Yes   Intervention Provide advice, education, support and counseling about physical activity/exercise needs.;Develop an individualized exercise prescription for aerobic and resistive training based on initial evaluation findings, risk stratification, comorbidities and participant's personal goals. Provide advice, education, support and counseling about physical activity/exercise needs.;Develop an individualized exercise prescription for aerobic and resistive training based on initial evaluation findings, risk stratification, comorbidities and participant's personal goals. Provide advice, education, support and counseling about physical activity/exercise needs.;Develop an individualized exercise prescription for aerobic and resistive training based on initial evaluation findings, risk stratification, comorbidities and participant's personal goals. Provide advice, education, support and counseling about physical activity/exercise needs.;Develop an individualized exercise prescription for aerobic and resistive training based on initial evaluation findings, risk stratification, comorbidities and participant's personal goals. Provide advice, education, support and counseling about physical activity/exercise needs.;Develop an individualized exercise prescription for aerobic and resistive training  based on initial evaluation findings, risk stratification, comorbidities and participant's personal goals.   Expected Outcomes Short Term: Increase workloads from initial exercise prescription for resistance, speed, and METs.;Short Term: Perform resistance training exercises routinely during rehab and add in resistance training at home;Kray Term: Improve cardiorespiratory fitness, muscular endurance and strength as measured  by increased METs and functional capacity (6MWT) Short Term: Increase workloads from initial exercise prescription for resistance, speed, and METs.;Short Term: Perform resistance training exercises routinely during rehab and add in resistance training at home;Toelle Term: Improve cardiorespiratory fitness, muscular endurance and strength as measured by increased METs and functional capacity (6MWT) Short Term: Increase workloads from initial exercise prescription for resistance, speed, and METs.;Short Term: Perform resistance training exercises routinely during rehab and add in resistance training at home;Shenefield Term: Improve cardiorespiratory fitness, muscular endurance and strength as measured by increased METs and functional capacity (6MWT) Short Term: Increase workloads from initial exercise prescription for resistance, speed, and METs.;Short Term: Perform resistance training exercises routinely during rehab and add in resistance training at home;Keena Term: Improve cardiorespiratory fitness, muscular endurance and strength as measured by increased METs and functional capacity (6MWT) Short Term: Increase workloads from initial exercise prescription for resistance, speed, and METs.;Short Term: Perform resistance training exercises routinely during rehab and add in resistance training at home;Allbee Term: Improve cardiorespiratory fitness, muscular endurance and strength as measured by increased METs and functional capacity (6MWT)   Able to understand and use rate of perceived exertion (RPE) scale  Yes Yes Yes Yes Yes   Intervention Provide education and explanation on how to use RPE scale Provide education and explanation on how to use RPE scale Provide education and explanation on how to use RPE scale Provide education and explanation on how to use RPE scale Provide education and explanation on how to use RPE scale   Expected Outcomes Short Term: Able to use RPE daily in rehab to express subjective intensity level;Johannsen Term:  Able to use RPE to guide intensity level when exercising independently -- Short Term: Able to use RPE daily in rehab to express subjective intensity level;Mckinstry Term:  Able to use RPE to guide intensity level when exercising independently Short Term: Able to use RPE daily in rehab to express subjective intensity level;Brook Term:  Able to use RPE to guide intensity level when exercising independently Short Term: Able to use RPE daily in rehab to express subjective intensity level;Crymes Term:  Able to use RPE to guide intensity level when exercising independently   Able to understand and use Dyspnea scale Yes Yes Yes Yes Yes   Intervention Provide education and explanation on how to use Dyspnea scale Provide education and explanation on how to use Dyspnea scale Provide education and explanation on how to use Dyspnea scale Provide education and explanation on how to use Dyspnea scale Provide education and explanation on how to use Dyspnea scale   Expected Outcomes Hnat Term: Able to use Dyspnea scale to guide intensity level when exercising independently;Short Term: Able to use Dyspnea scale daily in rehab to express subjective sense of shortness of breath during exertion Schubring Term: Able to use Dyspnea scale to guide intensity level when exercising independently;Short Term: Able to use Dyspnea scale daily in rehab to express subjective sense of shortness of breath during exertion Easom Term: Able to use Dyspnea scale to guide intensity level when exercising independently;Short Term: Able  to use Dyspnea scale daily in rehab to express subjective sense of shortness of breath during exertion Hepner Term: Able to use Dyspnea scale to guide intensity level when exercising independently;Short Term: Able to use Dyspnea scale daily in rehab to express subjective sense of shortness of breath during exertion Whitsel Term: Able to use Dyspnea scale to guide intensity level when exercising independently;Short Term: Able to use Dyspnea scale daily in rehab to  express subjective sense of shortness of breath during exertion   Knowledge and understanding of Target Heart Rate Range (THRR) Yes Yes Yes Yes Yes   Intervention Provide education and explanation of THRR including how the numbers were predicted and where they are located for reference Provide education and explanation of THRR including how the numbers were predicted and where they are located for reference Provide education and explanation of THRR including how the numbers were predicted and where they are located for reference Provide education and explanation of THRR including how the numbers were predicted and where they are located for reference Provide education and explanation of THRR including how the numbers were predicted and where they are located for reference   Expected Outcomes Short Term: Able to state/look up THRR;Rispoli Term: Able to use THRR to govern intensity when exercising independently;Short Term: Able to use daily as guideline for intensity in rehab Short Term: Able to state/look up THRR;Dandy Term: Able to use THRR to govern intensity when exercising independently;Short Term: Able to use daily as guideline for intensity in rehab Short Term: Able to state/look up THRR;Santo Term: Able to use THRR to govern intensity when exercising independently;Short Term: Able to use daily as guideline for intensity in rehab Short Term: Able to state/look up THRR;Worrel Term: Able to use THRR to govern intensity when exercising independently;Short Term:  Able to use daily as guideline for intensity in rehab Short Term: Able to state/look up THRR;Kilgore Term: Able to use THRR to govern intensity when exercising independently;Short Term: Able to use daily as guideline for intensity in rehab   Understanding of Exercise Prescription Yes Yes Yes Yes Yes   Intervention Provide education, explanation, and written materials on patient's individual exercise prescription Provide education, explanation, and written materials on patient's individual exercise prescription Provide education, explanation, and written materials on patient's individual exercise prescription Provide education, explanation, and written materials on patient's individual exercise prescription Provide education, explanation, and written materials on patient's individual exercise prescription   Expected Outcomes Short Term: Able to explain program exercise prescription;Twiford Term: Able to explain home exercise prescription to exercise independently Short Term: Able to explain program exercise prescription;Ballo Term: Able to explain home exercise prescription to exercise independently Short Term: Able to explain program exercise prescription;Jarboe Term: Able to explain home exercise prescription to exercise independently Short Term: Able to explain program exercise prescription;Voiles Term: Able to explain home exercise prescription to exercise independently Short Term: Able to explain program exercise prescription;Spadafore Term: Able to explain home exercise prescription to exercise independently            Exercise Goals Re-Evaluation:  Exercise Goals Re-Evaluation     Row Name 10/07/22 1439 11/04/22 1547 12/02/22 1512 12/30/22 1437       Exercise Goal Re-Evaluation   Exercise Goals Review Increase Strength and Stamina;Increase Physical Activity;Able to understand and use Dyspnea scale;Able to understand and use rate of perceived exertion (RPE) scale;Knowledge and understanding of Target Heart  Rate Range (THRR);Understanding of Exercise Prescription Increase Physical Activity;Increase Strength and Stamina;Able to understand and use rate of perceived exertion (RPE) scale;Able to understand and use Dyspnea scale;Knowledge and understanding of Target Heart Rate Range (THRR);Understanding of Exercise Prescription Increase Physical Activity;Increase Strength and Stamina;Able to understand and use rate of perceived exertion (RPE) scale;Able to understand and use Dyspnea scale;Knowledge and understanding of Target Heart Rate Range (THRR);Understanding of Exercise Prescription Increase Physical Activity;Increase Strength and Stamina;Able to understand and use rate of perceived exertion (RPE) scale;Understanding of Exercise  Prescription;Knowledge and understanding of Target Heart Rate Range (THRR);Able to understand and use Dyspnea scale    Comments Pt has completed 5 sessions of PR. He has wanted to use the treadmill but due to walking with a walker and him being a high fall risk staff have had to tell him that he is not able to. He does seem to get discouraged during exercise due to his SOB and needing to take breaks to bring hos SpO2 up. He is currently exercising at 1.8 METs on the stepper. Will continue to monitor and progress as able. Pt has completed 9 sessions of PR. He has been able to use 2L of oxygen when exercising the past two sessions and not needing to go back up to 4L. He no longer uses the arm ergometer, he states that it hurts his shoulders. He continues to ask to use the treadmill and has to be reminded that he is a fall risk due to using a upright walker. He is currently exercising at 1.7 METs on the stepper. Will continue to monitor and progress as able. Pt has completed 18 sessions of PR. He has been increasing his oxygen to 4L while exercising. He no longer is able to use the arm ergometer due to shoulder pain. Pt has been frustrated that staff will not allow him to use the treadmill. We  continuously reinforce that he is a high fall risk and that he uses a high walker, so he would be unable to take the weight off of his hips on the treadmill like he does when he walks with the high walker. His progress has been slow in the program. He is currently exercising at 1.8 METs on the stepper. Will continue to monitor and progess as able. Pt has completed24 sessions of PR. He continues to use the stepper for both sessions and is tolerating it well. He continues to progress slowly and has slowly increased his SPM. He is currently exercising at 1.8 METs on the stepper. Will continue to monitor and progress as able.    Expected Outcomes Through exercise at home and at rehab, the patient will meet their stated goals. Through exercise at home and at rehab, the patient will meet their stated goals. Through exercise at home and at rehab, the patient will meet their stated goals. Through exercise at home and at rehab, the patient will meet their stated goals.             Nutrition & Weight - Outcomes:  Pre Biometrics - 09/12/22 1417       Pre Biometrics   Height '5\' 11"'$  (1.803 m)    Weight 177 lb 4 oz (80.4 kg)    Waist Circumference 39 inches    Hip Circumference 39 inches    Waist to Hip Ratio 1 %    BMI (Calculated) 24.73    Triceps Skinfold 9 mm    % Body Fat 23.7 %    Grip Strength 40.7 kg    Flexibility 0 in    Single Leg Stand 0 seconds             Post Biometrics - 01/13/23 1416        Post  Biometrics   Height '5\' 11"'$  (1.803 m)    Weight 173 lb 15.1 oz (78.9 kg)    Waist Circumference 36 inches    Hip Circumference 39 inches    Waist to Hip Ratio 0.92 %    BMI (Calculated) 24.27  Triceps Skinfold 9 mm    % Body Fat 22.1 %    Grip Strength 44.1 kg    Flexibility 0 in    Single Leg Stand 0 seconds             Nutrition:  Nutrition Therapy & Goals - 09/12/22 1401       Personal Nutrition Goals   Comments Patient scored 10 on his diet assessment. His  wife cooks and says she trys to cook healthy meals. Handout provided and explained to he and his wife regarding healthier choices. We offer 2 educational sessions on heart healthy nutrition with handouts and assistance with RD referral if patient is interested.      Intervention Plan   Intervention Nutrition handout(s) given to patient.    Expected Outcomes Short Term Goal: Understand basic principles of dietary content, such as calories, fat, sodium, cholesterol and nutrients.             Nutrition Discharge:  Nutrition Assessments - 01/15/23 0931       MEDFICTS Scores   Pre Score 10    Post Score 25    Score Difference 15             Education Questionnaire Score:  Knowledge Questionnaire Score - 01/15/23 0925       Knowledge Questionnaire Score   Pre Score 4/18    Post Score 14/18             Goals reviewed with patient; copy given to patient. Pt graduated from [place] after 29 sessions. His walk test distance decreased by 2.94%, and his MET level at discharge was 2.0. He reports no plans to continue exercise post discharge. The importance of continued exercise was stressed to him multiple times by staff. He was encouraged to look into getting a gym membership at the [facility] or [employer].

## 2023-01-15 NOTE — Op Note (Signed)
[facility] Patient Name: Chase Lloyd Procedure Date: 01/15/2023 MRN: [id number] Attending MD: Georgian Co , , [id number] Date of Birth: November 03, 1956 CSN: [id number] Age: 67 Admit Type: Outpatient Procedure:                Colonoscopy Indications:              Screening for colorectal malignant neoplasm Providers:                Adline Mango" Camillo Flaming, RN, Janee Morn, Technician Referring MD:             [name] Medicines:                Monitored Anesthesia Care Complications:            No immediate complications. Estimated Blood Loss:     Estimated blood loss was minimal. Procedure:                Pre-Anesthesia Assessment:                           - Prior to the procedure, a History and Physical                            was performed, and patient medications and                            allergies were reviewed. The patient's tolerance of                            previous anesthesia was also reviewed. The risks                            and benefits of the procedure and the sedation                            options and risks were discussed with the patient.                            All questions were answered, and informed consent                            was obtained. Prior Anticoagulants: The patient has                            taken no anticoagulant or antiplatelet agents. ASA                            Grade Assessment: III - A patient with severe                            systemic disease. After reviewing the risks and  benefits, the patient was deemed in satisfactory                            condition to undergo the procedure.                           After obtaining informed consent, the colonoscope                            was passed under direct vision. Throughout the                            procedure, the patient's blood pressure, pulse, and                             oxygen saturations were monitored continuously. The                            [id number] Olympus colonoscope was                            introduced through the anus and advanced to the the                            terminal ileum. The colonoscopy was performed                            without difficulty. The patient tolerated the                            procedure well. The quality of the bowel                            preparation was adequate. The terminal ileum,                            ileocecal valve, appendiceal orifice, and rectum                            were photographed. Scope In: 10:03:58 AM Scope Out: 10:23:53 AM Scope Withdrawal Time: 0 hours 16 minutes 59 seconds  Total Procedure Duration: 0 hours 19 minutes 55 seconds  Findings:      The terminal ileum appeared normal.      Four sessile polyps were found in the transverse colon. The polyps were       3 to 7 mm in size. These polyps were removed with a cold snare.       Resection and retrieval were complete.      Non-bleeding internal hemorrhoids were found during retroflexion. Impression:               - The examined portion of the ileum was normal.                           - Four 3 to 7 mm polyps in the transverse colon,  removed with a cold snare. Resected and retrieved.                           - Non-bleeding internal hemorrhoids. Moderate Sedation:      Not Applicable - Patient had care per Anesthesia. Recommendation:           - Discharge patient to home (with escort).                           - Await pathology results.                           - The findings and recommendations were discussed                            with the patient.                           - Return to GI clinic in 6 weeks. Procedure Code(s):        --- Professional ---                           [phone], Colonoscopy, flexible; with removal of                             tumor(s), polyp(s), or other lesion(s) by snare                            technique Diagnosis Code(s):        --- Professional ---                           Z12.11, Encounter for screening for malignant                            neoplasm of colon                           K64.8, Other hemorrhoids                           D12.3, Benign neoplasm of transverse colon (hepatic                            flexure or splenic flexure) CPT copyright 2022 American Medical Association. All rights reserved. The codes documented in this report are preliminary and upon coder review may  be revised to meet current compliance requirements. Dr Georgian Co "[name]" [name],  01/15/2023 10:39:58 AM Number of Addenda: 0

## 2023-01-15 NOTE — Anesthesia Procedure Notes (Signed)
Date/Time: 01/15/2023 9:47 AM  Performed by: Sharlette Dense, CRNAOxygen Delivery Method: Simple face mask

## 2023-01-15 NOTE — Discharge Instructions (Signed)
YOU HAD AN ENDOSCOPIC PROCEDURE TODAY: Refer to the procedure report and other information in the discharge instructions given to you for any specific questions about what was found during the examination. If this information does not answer your questions, please call [place] office at [phone] to clarify.   YOU SHOULD EXPECT: Some feelings of bloating in the abdomen. Passage of more gas than usual. Walking can help get rid of the air that was put into your GI tract during the procedure and reduce the bloating. If you had a lower endoscopy (such as a colonoscopy or flexible sigmoidoscopy) you may notice spotting of blood in your stool or on the toilet paper. Some abdominal soreness may be present for a day or two, also.  DIET: Your first meal following the procedure should be a light meal and then it is ok to progress to your normal diet. A half-sandwich or bowl of soup is an example of a good first meal. Heavy or fried foods are harder to digest and may make you feel nauseous or bloated. Drink plenty of fluids but you should avoid alcoholic beverages for 24 hours. If you had a esophageal dilation, please see attached instructions for diet.    ACTIVITY: Your care partner should take you home directly after the procedure. You should plan to take it easy, moving slowly for the rest of the day. You can resume normal activity the day after the procedure however YOU SHOULD NOT DRIVE, use power tools, machinery or perform tasks that involve climbing or major physical exertion for 24 hours (because of the sedation medicines used during the test).   SYMPTOMS TO REPORT IMMEDIATELY: A gastroenterologist can be reached at any hour. Please call [phone]  for any of the following symptoms:   Following lower endoscopy (colonoscopy) Excessive amounts of blood in the stool  Significant tenderness, worsening of abdominal pains  Swelling of the abdomen that is new, acute  Fever of 100 or higher   Following  upper endoscopy (EGD) Vomiting of blood or coffee ground material  New, significant abdominal pain  New, significant chest pain or pain under the shoulder blades  Painful or persistently difficult swallowing  New shortness of breath  Black, tarry-looking or red, bloody stools  FOLLOW UP:  If any biopsies were taken you will be contacted by phone or by letter within the next 1-3 weeks. Call [phone]  if you have not heard about the biopsies in 3 weeks.  Please also call with any specific questions about appointments or follow up tests.  

## 2023-01-15 NOTE — Op Note (Signed)
[address] [facility] Patient Name: Chase Lloyd Procedure Date: 01/15/2023 MRN: [id number] Attending MD: Georgian Co , , [id number] Date of Birth: May 28, 1956 CSN: [id number] Age: 67 Admit Type: Outpatient Procedure:                Upper GI endoscopy Indications:              Dysphagia Providers:                Adline Mango" Camillo Flaming, RN, Janee Morn, Technician Referring MD:             [name] Medicines:                Monitored Anesthesia Care Complications:            No immediate complications. Estimated Blood Loss:     Estimated blood loss was minimal. Procedure:                Pre-Anesthesia Assessment:                           - Prior to the procedure, a History and Physical                            was performed, and patient medications and                            allergies were reviewed. The patient's tolerance of                            previous anesthesia was also reviewed. The risks                            and benefits of the procedure and the sedation                            options and risks were discussed with the patient.                            All questions were answered, and informed consent                            was obtained. Prior Anticoagulants: The patient has                            taken no anticoagulant or antiplatelet agents. ASA                            Grade Assessment: III - A patient with severe                            systemic disease. After reviewing the risks and  benefits, the patient was deemed in satisfactory                            condition to undergo the procedure.                           After obtaining informed consent, the endoscope was                            passed under direct vision. Throughout the                            procedure, the patient's blood pressure, pulse, and                             oxygen saturations were monitored continuously. The                            [id number] Olympus endoscope was introduced                            through the mouth, and advanced to the second part                            of duodenum. The upper GI endoscopy was                            accomplished without difficulty. The patient                            tolerated the procedure well. Scope In: Scope Out: Findings:      White nummular lesions were noted in the mid esophagus and in the distal       esophagus. Biopsies were taken of the proximal, mid, and distal       esophagus with cold forceps for histology.      Localized mildly erythematous mucosa without bleeding was found in the       gastric antrum. Biopsies were taken with a cold forceps for Helicobacter       pylori testing.      Localized inflammation characterized by congestion (edema) and erythema       was found in the duodenal bulb. Impression:               - White nummular lesions in esophageal mucosa.                            Biopsied.                           - Erythematous mucosa in the antrum. Biopsied.                           - Duodenitis. Moderate Sedation:      Not Applicable - Patient had care per Anesthesia. Recommendation:           - Await pathology results.                           -  Perform a colonoscopy today. Procedure Code(s):        --- Professional ---                           [phone], Esophagogastroduodenoscopy, flexible,                            transoral; with biopsy, single or multiple Diagnosis Code(s):        --- Professional ---                           K22.89, Other specified disease of esophagus                           K31.89, Other diseases of stomach and duodenum                           K29.80, Duodenitis without bleeding                           R13.10, Dysphagia, unspecified CPT copyright 2022 American Medical Association. All rights reserved. The codes  documented in this report are preliminary and upon coder review may  be revised to meet current compliance requirements. Dr Georgian Co "[name]" [name],  01/15/2023 10:37:04 AM Number of Addenda: 0

## 2023-01-16 ENCOUNTER — Encounter (HOSPITAL_COMMUNITY): Payer: Self-pay | Admitting: Internal Medicine

## 2023-01-16 LAB — SURGICAL PATHOLOGY

## 2023-01-18 ENCOUNTER — Encounter: Payer: Self-pay | Admitting: Internal Medicine

## 2023-01-19 ENCOUNTER — Inpatient Hospital Stay (HOSPITAL_COMMUNITY): Admission: RE | Admit: 2023-01-19 | Payer: Medicare Other | Source: Ambulatory Visit

## 2023-01-26 ENCOUNTER — Ambulatory Visit (HOSPITAL_COMMUNITY)
Admission: RE | Admit: 2023-01-26 | Discharge: 2023-01-26 | Disposition: A | Discharge status: Home / Self Care | Payer: Medicare Other | Source: Ambulatory Visit | Attending: Cardiology | Admitting: Cardiology

## 2023-01-26 DIAGNOSIS — I739 Peripheral vascular disease, unspecified: Secondary | ICD-10-CM | POA: Insufficient documentation

## 2023-01-26 DIAGNOSIS — R200 Anesthesia of skin: Secondary | ICD-10-CM | POA: Diagnosis not present

## 2023-01-26 DIAGNOSIS — Z87891 Personal history of nicotine dependence: Secondary | ICD-10-CM

## 2023-01-27 LAB — VAS US ABI WITH/WO TBI
Left ABI: 0.6
Right ABI: 0.26

## 2023-01-29 ENCOUNTER — Encounter: Payer: Self-pay | Admitting: Cardiovascular Disease

## 2023-01-29 ENCOUNTER — Other Ambulatory Visit
Admission: RE | Admit: 2023-01-29 | Discharge: 2023-01-29 | Disposition: A | Discharge status: Home / Self Care | Payer: Medicare Other | Source: Ambulatory Visit | Attending: Cardiovascular Disease | Admitting: Cardiovascular Disease

## 2023-01-29 ENCOUNTER — Ambulatory Visit: Payer: Medicare Other | Attending: Cardiovascular Disease | Admitting: Cardiovascular Disease

## 2023-01-29 VITALS — BP 110/42 | HR 92 | Ht 71.0 in | Wt 170.5 lb

## 2023-01-29 DIAGNOSIS — R000 Tachycardia, unspecified: Secondary | ICD-10-CM

## 2023-01-29 DIAGNOSIS — I5032 Chronic diastolic (congestive) heart failure: Secondary | ICD-10-CM | POA: Diagnosis not present

## 2023-01-29 DIAGNOSIS — E785 Hyperlipidemia, unspecified: Secondary | ICD-10-CM | POA: Diagnosis not present

## 2023-01-29 DIAGNOSIS — I2510 Atherosclerotic heart disease of native coronary artery without angina pectoris: Secondary | ICD-10-CM | POA: Diagnosis not present

## 2023-01-29 DIAGNOSIS — I739 Peripheral vascular disease, unspecified: Secondary | ICD-10-CM | POA: Diagnosis not present

## 2023-01-29 LAB — BASIC METABOLIC PANEL
Anion gap: 14 (ref 5–15)
BUN: 14 mg/dL (ref 8–23)
CO2: 30 mmol/L (ref 22–32)
Calcium: 8.9 mg/dL (ref 8.9–10.3)
Chloride: 94 mmol/L — ABNORMAL LOW (ref 98–111)
Creatinine, Ser: 1.02 mg/dL (ref 0.61–1.24)
GFR, Estimated: >60 mL/min (ref >60)
Glucose, Bld: 123 mg/dL — ABNORMAL HIGH (ref 70–99)
Potassium: 3.7 mmol/L (ref 3.5–5.1)
Sodium: 138 mmol/L (ref 135–145)

## 2023-01-29 LAB — CBC
HCT: 36.3 % — ABNORMAL LOW (ref 39.0–52.0)
Hemoglobin: 12.1 g/dL — ABNORMAL LOW (ref 13.0–17.0)
MCH: 31.6 pg (ref 26.0–34.0)
MCHC: 33.3 g/dL (ref 30.0–36.0)
MCV: 94.8 fL (ref 80.0–100.0)
Platelets: 158 10*3/uL (ref 150–400)
RBC: 3.83 MIL/uL — ABNORMAL LOW (ref 4.22–5.81)
RDW: 13.1 % (ref 11.5–15.5)
WBC: 6.6 10*3/uL (ref 4.0–10.5)
nRBC: 0 % (ref 0.0–0.2)

## 2023-01-29 NOTE — Progress Notes (Signed)
Cardiology Office Note   Date:  01/29/2023   ID:  Chase Lloyd, Chase Lloyd 05-29-1956, MRN [id number]  PCP:  [name], NP  Cardiologist:  Dr. Malena Catholic  Chief Complaint  Patient presents with   Consult    Ref by Chase Dopp, PA-C for bilatreal disease leg greater than Left/fc (PAD. Patient c/o right foot numbness, bilateral leg pain with walking and LE edema. Medications reviewed by the patient verbally.       History of Present Illness: Chase Lloyd is a 67 y.o. male who was referred by Dr. Johney Frame for evaluation of peripheral arterial disease. He has known history of coronary artery disease with previous cardiac catheterization showing severely calcified coronary arteries with mild to moderate nonobstructive disease, chronic diastolic heart failure, moderate aortic insufficiency, essential hypertension, hyperlipidemia, previous back surgery, severe COPD on home oxygen and Hodgkin's disease. The patient reports prolonged symptoms of severe bilateral calf claudication with minimal walking.  Recently, he started having symptoms at rest mostly at night affecting the right leg which include severe cramping in the calf, numbness and cold sensation.  No lower extremity ulceration. He underwent recent noninvasive vascular testing which showed an ABI of 0.26 on the right and 0.60 on the left. Duplex showed near stenosis in the left external iliac artery with moderate stenosis in the left SFA.  On the right side, the common femoral artery was occluded as well as the SFA.  He denies chest pain and no worsening dyspnea.  He quit smoking in 2019.  Past Medical History:  Diagnosis Date   Arthritis    Back pain    COPD (chronic obstructive pulmonary disease) (HCC)    DDD (degenerative disc disease), lumbar    Depression    Emphysema of lung ([place])    Empyema ([place])    2016  (was in [place])   GERD (gastroesophageal reflux disease)    Hearing loss of right ear    started 2001.      microphone in right ear ,  and hearing aid in left   Hodgkin's disease ([place]) 1999   Hypotension    Meniere's disease    Pneumonia    Tinnitus     Past Surgical History:  Procedure Laterality Date   BACK SURGERY     BIOPSY  01/15/2023   Procedure: BIOPSY;  Surgeon: Sharyn Creamer, MD;  Location: [facility];  Service: Gastroenterology;;   CERVICAL FUSION  2016   COLONOSCOPY WITH PROPOFOL N/A 01/15/2023   Procedure: COLONOSCOPY WITH PROPOFOL;  Surgeon: Sharyn Creamer, MD;  Location: [facility];  Service: Gastroenterology;  Laterality: N/A;   ELBOW SURGERY     ESOPHAGOGASTRODUODENOSCOPY (EGD) WITH PROPOFOL N/A 01/15/2023   Procedure: ESOPHAGOGASTRODUODENOSCOPY (EGD) WITH PROPOFOL;  Surgeon: Sharyn Creamer, MD;  Location: [facility];  Service: Gastroenterology;  Laterality: N/A;   EYE SURGERY     IR THORACENTESIS ASP PLEURAL SPACE W/IMG GUIDE  10/08/2018   LAMINECTOMY  1999   lower back surgery     POLYPECTOMY  01/15/2023   Procedure: POLYPECTOMY;  Surgeon: Sharyn Creamer, MD;  Location: [facility];  Service: Gastroenterology;;   RIGHT/LEFT HEART CATH AND CORONARY ANGIOGRAPHY N/A 04/29/2019   Procedure: RIGHT/LEFT HEART CATH AND CORONARY ANGIOGRAPHY;  Surgeon: Troy Sine, MD;  Location: [facility];  Service: Cardiovascular;  Laterality: N/A;   TONSILLECTOMY       Current Outpatient Medications  Medication Sig Dispense Refill   albuterol (PROVENTIL) (2.5 MG/3ML) 0.083%  nebulizer solution Take 3 mLs (2.5 mg total) by nebulization every 4 (four) hours as needed for wheezing or shortness of breath. 75 mL 12   albuterol (VENTOLIN HFA) 108 (90 Base) MCG/ACT inhaler Inhale 2 puffs into the lungs every 6 (six) hours as needed for wheezing or shortness of breath. 8 g 6   aspirin 81 MG chewable tablet Chew 81 mg by mouth daily.     Budeson-Glycopyrrol-Formoterol (BREZTRI AEROSPHERE) 160-9-4.8 MCG/ACT AERO Inhale 2 puffs into the lungs 2 (two) times daily. 3 each 3   furosemide  (LASIX) 20 MG tablet Take 20 mg by mouth daily.     ibuprofen (ADVIL) 800 MG tablet TAKE 1 TABLET BY MOUTH EVERY 8 HOURS AS NEEDED 270 tablet 0   metoprolol succinate (TOPROL-XL) 25 MG 24 hr tablet Take 1 tablet (25 mg total) by mouth daily. 90 tablet 3   omeprazole (PRILOSEC) 40 MG capsule Take 1 capsule by mouth twice daily 180 capsule 0   rosuvastatin (CRESTOR) 10 MG tablet Take 1 tablet (10 mg total) by mouth daily. 90 tablet 3   sodium chloride (OCEAN) 0.65 % SOLN nasal spray Place 1 spray into both nostrils as needed for congestion.     No current facility-administered medications for this visit.    Allergies:   Patient has no known allergies.    Social History:  The patient  reports that he quit smoking about 4 years ago. His smoking use included cigarettes. He has a 70.50 pack-year smoking history. He has never used smokeless tobacco. He reports current alcohol use. He reports that he does not use drugs.   Family History:  The patient's family history includes Diabetes in his father; Hypertension in his father; Melanoma in his brother; Testicular cancer in his brother.    ROS:  Please see the history of present illness.   Otherwise, review of systems are positive for none.   All other systems are reviewed and negative.    PHYSICAL EXAM: VS:  BP (!) 110/42 (BP Location: Left Arm, Patient Position: Sitting, Cuff Size: Normal)   Pulse 92   Ht 5\' 11"  (075-GRM m)   Wt 170 lb 8 oz (77.3 kg)   SpO2 96% Comment: 2 Liters of O2  BMI 23.78 kg/m  , BMI Body mass index is 23.78 kg/m. GEN: Well nourished, well developed, in no acute distress  HEENT: normal  Neck: no JVD, carotid bruits, or masses Cardiac: RRR; no rubs, or gallops,no edema .  1 out of 6 systolic murmur in the aortic area Respiratory:  clear to auscultation bilaterally, normal work of breathing GI: soft, nontender, nondistended, + BS MS: no deformity or atrophy  Skin: warm and dry, no rash Neuro:  Strength and sensation  are intact Psych: euthymic mood, full affect Vascular: Femoral: Palpable on the right and +1 on the left.  Distal pulses are not palpable.  Radial pulses normal bilaterally   EKG:  EKG is not ordered today.    Recent Labs: 11/11/2022: ALT 17 12/31/2022: TSH 7.880    Lipid Panel    Component Value Date/Time   CHOL 194 11/11/2022 0900   CHOL 136 07/01/2019 0741   TRIG 95 11/11/2022 0900   HDL 43 11/11/2022 0900   HDL 47 07/01/2019 0741   CHOLHDL 4.5 11/11/2022 0900   VLDL 19 11/11/2022 0900   LDLCALC 132 (H) 11/11/2022 0900   LDLCALC 66 07/01/2019 0741   LDLDIRECT 49 12/31/2022 1512      Wt Readings from Last  3 Encounters:  01/29/23 170 lb 8 oz (77.3 kg)  01/15/23 174 lb (78.9 kg)  01/13/23 173 lb 15.1 oz (78.9 kg)          01/06/2019    2:29 PM  PAD Screen  Previous PAD dx? No  Previous surgical procedure? No  Pain with walking? No  Feet/toe relief with dangling? No  Painful, non-healing ulcers? No  Extremities discolored? No      ASSESSMENT AND PLAN:  1.  Peripheral arterial disease: The patient has rest pain affecting the right lower extremity and severe claudication affecting the left lower extremity.  No lower extremity ulcerations or wounds.  His ABI was severely reduced on the right at 0.26.  Given severity of his symptoms, I recommend proceeding with abdominal aortogram with lower extremity angiography and possible endovascular intervention.  I discussed the procedure in details as well as risk and benefits.  Planned access is via the left common femoral artery .  I suspect that he will require stenting of the left external iliac artery and endarterectomy to the right common femoral artery. Hopefully he is a surgical candidate considering his COPD but he was able to tolerate general anesthesia recently for endoscopic procedures.  2.  Coronary artery disease involving native coronary arteries without angina: No anginal symptoms.  Continue medical  therapy.  3.  Tachycardia: Controlled with Toprol.  4.  Chronic diastolic heart failure: He appears to be euvolemic on small dose furosemide.  5.  Hyperlipidemia: Continue rosuvastatin with a target LDL of less than 70.    Disposition: Proceed with angiography next week and follow-up in 1 month.  Signed,  Kathlyn Sacramento, MD  01/29/2023 9:59 AM    [place]

## 2023-01-29 NOTE — H&P (View-Only) (Signed)
  Cardiology Office Note   Date:  01/29/2023   ID:  Assad L Mcdanel, DOB 05/06/1956, MRN [id number]  PCP:  Nafziger, Cory, NP  Cardiologist:  Dr. Pamberton  Chief Complaint  Patient presents with   Consult    Ref by Scott Weaver, PA-C for bilatreal disease leg greater than Left/fc (PAD. Patient c/o right foot numbness, bilateral leg pain with walking and LE edema. Medications reviewed by the patient verbally.       History of Present Illness: Chase Lloyd is a 67 y.o. male who was referred by Dr. Pemberton for evaluation of peripheral arterial disease. He has known history of coronary artery disease with previous cardiac catheterization showing severely calcified coronary arteries with mild to moderate nonobstructive disease, chronic diastolic heart failure, moderate aortic insufficiency, essential hypertension, hyperlipidemia, previous back surgery, severe COPD on home oxygen and Hodgkin's disease. The patient reports prolonged symptoms of severe bilateral calf claudication with minimal walking.  Recently, he started having symptoms at rest mostly at night affecting the right leg which include severe cramping in the calf, numbness and cold sensation.  No lower extremity ulceration. He underwent recent noninvasive vascular testing which showed an ABI of 0.26 on the right and 0.60 on the left. Duplex showed near stenosis in the left external iliac artery with moderate stenosis in the left SFA.  On the right side, the common femoral artery was occluded as well as the SFA.  He denies chest pain and no worsening dyspnea.  He quit smoking in 2019.  Past Medical History:  Diagnosis Date   Arthritis    Back pain    COPD (chronic obstructive pulmonary disease) (HCC)    DDD (degenerative disc disease), lumbar    Depression    Emphysema of lung (HCC)    Empyema (HCC)    2016  (was in [place])   GERD (gastroesophageal reflux disease)    Hearing loss of right ear    started 2001.      microphone in right ear ,  and hearing aid in left   Hodgkin's disease (HCC) 1999   Hypotension    Meniere's disease    Pneumonia    Tinnitus     Past Surgical History:  Procedure Laterality Date   BACK SURGERY     BIOPSY  01/15/2023   Procedure: BIOPSY;  Surgeon: Dorsey, Ying C, MD;  Location: [facility];  Service: Gastroenterology;;   CERVICAL FUSION  2016   COLONOSCOPY WITH PROPOFOL N/A 01/15/2023   Procedure: COLONOSCOPY WITH PROPOFOL;  Surgeon: Dorsey, Ying C, MD;  Location: [facility];  Service: Gastroenterology;  Laterality: N/A;   ELBOW SURGERY     ESOPHAGOGASTRODUODENOSCOPY (EGD) WITH PROPOFOL N/A 01/15/2023   Procedure: ESOPHAGOGASTRODUODENOSCOPY (EGD) WITH PROPOFOL;  Surgeon: Dorsey, Ying C, MD;  Location: [facility];  Service: Gastroenterology;  Laterality: N/A;   EYE SURGERY     IR THORACENTESIS ASP PLEURAL SPACE W/IMG GUIDE  10/08/2018   LAMINECTOMY  1999   lower back surgery     POLYPECTOMY  01/15/2023   Procedure: POLYPECTOMY;  Surgeon: Dorsey, Ying C, MD;  Location: [facility];  Service: Gastroenterology;;   RIGHT/LEFT HEART CATH AND CORONARY ANGIOGRAPHY N/A 04/29/2019   Procedure: RIGHT/LEFT HEART CATH AND CORONARY ANGIOGRAPHY;  Surgeon: Kelly, Thomas A, MD;  Location: [facility];  Service: Cardiovascular;  Laterality: N/A;   TONSILLECTOMY       Current Outpatient Medications  Medication Sig Dispense Refill   albuterol (PROVENTIL) (2.5 MG/3ML) 0.083%   nebulizer solution Take 3 mLs (2.5 mg total) by nebulization every 4 (four) hours as needed for wheezing or shortness of breath. 75 mL 12   albuterol (VENTOLIN HFA) 108 (90 Base) MCG/ACT inhaler Inhale 2 puffs into the lungs every 6 (six) hours as needed for wheezing or shortness of breath. 8 g 6   aspirin 81 MG chewable tablet Chew 81 mg by mouth daily.     Budeson-Glycopyrrol-Formoterol (BREZTRI AEROSPHERE) 160-9-4.8 MCG/ACT AERO Inhale 2 puffs into the lungs 2 (two) times daily. 3 each 3   furosemide  (LASIX) 20 MG tablet Take 20 mg by mouth daily.     ibuprofen (ADVIL) 800 MG tablet TAKE 1 TABLET BY MOUTH EVERY 8 HOURS AS NEEDED 270 tablet 0   metoprolol succinate (TOPROL-XL) 25 MG 24 hr tablet Take 1 tablet (25 mg total) by mouth daily. 90 tablet 3   omeprazole (PRILOSEC) 40 MG capsule Take 1 capsule by mouth twice daily 180 capsule 0   rosuvastatin (CRESTOR) 10 MG tablet Take 1 tablet (10 mg total) by mouth daily. 90 tablet 3   sodium chloride (OCEAN) 0.65 % SOLN nasal spray Place 1 spray into both nostrils as needed for congestion.     No current facility-administered medications for this visit.    Allergies:   Patient has no known allergies.    Social History:  The patient  reports that he quit smoking about 4 years ago. His smoking use included cigarettes. He has a 70.50 pack-year smoking history. He has never used smokeless tobacco. He reports current alcohol use. He reports that he does not use drugs.   Family History:  The patient's family history includes Diabetes in his father; Hypertension in his father; Melanoma in his brother; Testicular cancer in his brother.    ROS:  Please see the history of present illness.   Otherwise, review of systems are positive for none.   All other systems are reviewed and negative.    PHYSICAL EXAM: VS:  BP (!) 110/42 (BP Location: Left Arm, Patient Position: Sitting, Cuff Size: Normal)   Pulse 92   Ht 5' 11" (1.803 m)   Wt 170 lb 8 oz (77.3 kg)   SpO2 96% Comment: 2 Liters of O2  BMI 23.78 kg/m  , BMI Body mass index is 23.78 kg/m. GEN: Well nourished, well developed, in no acute distress  HEENT: normal  Neck: no JVD, carotid bruits, or masses Cardiac: RRR; no rubs, or gallops,no edema .  1 out of 6 systolic murmur in the aortic area Respiratory:  clear to auscultation bilaterally, normal work of breathing GI: soft, nontender, nondistended, + BS MS: no deformity or atrophy  Skin: warm and dry, no rash Neuro:  Strength and sensation  are intact Psych: euthymic mood, full affect Vascular: Femoral: Palpable on the right and +1 on the left.  Distal pulses are not palpable.  Radial pulses normal bilaterally   EKG:  EKG is not ordered today.    Recent Labs: 11/11/2022: ALT 17 12/31/2022: TSH 7.880    Lipid Panel    Component Value Date/Time   CHOL 194 11/11/2022 0900   CHOL 136 07/01/2019 0741   TRIG 95 11/11/2022 0900   HDL 43 11/11/2022 0900   HDL 47 07/01/2019 0741   CHOLHDL 4.5 11/11/2022 0900   VLDL 19 11/11/2022 0900   LDLCALC 132 (H) 11/11/2022 0900   LDLCALC 66 07/01/2019 0741   LDLDIRECT 49 12/31/2022 1512      Wt Readings from Last   3 Encounters:  01/29/23 170 lb 8 oz (77.3 kg)  01/15/23 174 lb (78.9 kg)  01/13/23 173 lb 15.1 oz (78.9 kg)          01/06/2019    2:29 PM  PAD Screen  Previous PAD dx? No  Previous surgical procedure? No  Pain with walking? No  Feet/toe relief with dangling? No  Painful, non-healing ulcers? No  Extremities discolored? No      ASSESSMENT AND PLAN:  1.  Peripheral arterial disease: The patient has rest pain affecting the right lower extremity and severe claudication affecting the left lower extremity.  No lower extremity ulcerations or wounds.  His ABI was severely reduced on the right at 0.26.  Given severity of his symptoms, I recommend proceeding with abdominal aortogram with lower extremity angiography and possible endovascular intervention.  I discussed the procedure in details as well as risk and benefits.  Planned access is via the left common femoral artery .  I suspect that he will require stenting of the left external iliac artery and endarterectomy to the right common femoral artery. Hopefully he is a surgical candidate considering his COPD but he was able to tolerate general anesthesia recently for endoscopic procedures.  2.  Coronary artery disease involving native coronary arteries without angina: No anginal symptoms.  Continue medical  therapy.  3.  Tachycardia: Controlled with Toprol.  4.  Chronic diastolic heart failure: He appears to be euvolemic on small dose furosemide.  5.  Hyperlipidemia: Continue rosuvastatin with a target LDL of less than 70.    Disposition: Proceed with angiography next week and follow-up in 1 month.  Signed,  Brenetta Penny, MD  01/29/2023 9:59 AM    [facility] 

## 2023-01-29 NOTE — Patient Instructions (Addendum)
Medication Instructions:  No changes *If you need a refill on your cardiac medications before your next appointment, please call your pharmacy*   Lab Work: Your provider would like for you to have following labs drawn: CBC and BMET.   Please go to the [facility] entrance and check in at the front desk.  You do not need an appointment.  They are open from 7am-6 pm.   If you have labs (blood work) drawn today and your tests are completely normal, you will receive your results only by: [place] (if you have MyChart) OR A paper copy in the mail If you have any lab test that is abnormal or we need to change your treatment, we will call you to review the results.   Testing/Procedures: Your physician has requested that you have a peripheral vascular angiogram. This exam is performed at the hospital. During this exam IV contrast is used to look at arterial blood flow. Please review the information sheet given for details.   Follow-Up: At [facility], you and your health needs are our priority.  As part of our continuing mission to provide you with exceptional heart care, we have created designated Provider Care Teams.  These Care Teams include your primary Cardiologist (physician) and Advanced Practice Providers (APPs -  Physician Assistants and Nurse Practitioners) who all work together to provide you with the care you need, when you need it.  We recommend signing up for the patient portal called "MyChart".  Sign up information is provided on this After Visit Summary.  MyChart is used to connect with patients for Virtual Visits (Telemedicine).  Patients are able to view lab/test results, encounter notes, upcoming appointments, etc.  Non-urgent messages can be sent to your provider as well.   To learn more about what you can do with MyChart, go to [email or web address].    Your next appointment:   In one month with Dr. Fletcher Anon or Almyra Deforest, [place] in [place] at the  [place] office. Other Instructions  [place] [facility] DE[facility] OF [place], [place] [address] Dept: [phone] Loc: [place]  01/29/2023  You are scheduled for a Peripheral Angiogram on Wednesday, April 3 with Dr. Kathlyn Sacramento.  1. Please arrive at the [facility] (Main Entrance A) at [facility]: [address] [place], [place] [address] at 9 am (This time is two hours before your procedure to ensure your preparation). Free valet parking service is available.   Special note: Every effort is made to have your procedure done on time. Please understand that emergencies sometimes delay scheduled procedures.  2. Diet: Do not eat solid foods after midnight.  The patient may have clear liquids until 5am upon the day of the procedure.  3. Labs: You will need to have blood drawn on 3/28. You do not need to be fasting.  4. Medication instructions in preparation for your procedure: Hold the furosemide the morning of the procedure  On the morning of your procedure, take your Aspirin 81 mg and any morning medicines NOT listed above.  You may use sips of water.  5. Plan for one night stay--bring personal belongings. 6. Bring a current list of your medications and current insurance cards. 7. You MUST have a responsible person to drive you home. 8. Someone MUST be with you the first 24 hours after you arrive home or your discharge  will be delayed. 9. Please wear clothes that are easy to get on and off and wear slip-on shoes.  Thank you for allowing [name] to care for you!   -- [place] [facility]

## 2023-02-04 ENCOUNTER — Observation Stay (HOSPITAL_COMMUNITY)
Admission: RE | Admit: 2023-02-04 | Discharge: 2023-02-05 | Disposition: A | Discharge status: Home / Self Care | Payer: Medicare Other | Attending: Cardiovascular Disease | Admitting: Cardiovascular Disease

## 2023-02-04 ENCOUNTER — Other Ambulatory Visit: Payer: Self-pay

## 2023-02-04 ENCOUNTER — Encounter (HOSPITAL_COMMUNITY): Payer: Self-pay | Admitting: Cardiovascular Disease

## 2023-02-04 ENCOUNTER — Ambulatory Visit (HOSPITAL_COMMUNITY)
Admission: RE | Disposition: A | Discharge status: Home / Self Care | Payer: Medicare Other | Source: Home / Self Care | Attending: Cardiovascular Disease

## 2023-02-04 DIAGNOSIS — I2510 Atherosclerotic heart disease of native coronary artery without angina pectoris: Secondary | ICD-10-CM | POA: Insufficient documentation

## 2023-02-04 DIAGNOSIS — R931 Abnormal findings on diagnostic imaging of heart and coronary circulation: Secondary | ICD-10-CM | POA: Diagnosis present

## 2023-02-04 DIAGNOSIS — I70221 Atherosclerosis of native arteries of extremities with rest pain, right leg: Secondary | ICD-10-CM | POA: Diagnosis not present

## 2023-02-04 DIAGNOSIS — I5032 Chronic diastolic (congestive) heart failure: Secondary | ICD-10-CM | POA: Diagnosis not present

## 2023-02-04 DIAGNOSIS — I110 Hypertensive heart disease with heart failure: Secondary | ICD-10-CM | POA: Diagnosis not present

## 2023-02-04 DIAGNOSIS — Z7982 Long term (current) use of aspirin: Secondary | ICD-10-CM | POA: Insufficient documentation

## 2023-02-04 DIAGNOSIS — J9611 Chronic respiratory failure with hypoxia: Secondary | ICD-10-CM | POA: Insufficient documentation

## 2023-02-04 DIAGNOSIS — Z79899 Other long term (current) drug therapy: Secondary | ICD-10-CM | POA: Diagnosis not present

## 2023-02-04 DIAGNOSIS — Z8571 Personal history of Hodgkin lymphoma: Secondary | ICD-10-CM | POA: Insufficient documentation

## 2023-02-04 DIAGNOSIS — I70212 Atherosclerosis of native arteries of extremities with intermittent claudication, left leg: Secondary | ICD-10-CM | POA: Diagnosis not present

## 2023-02-04 DIAGNOSIS — I739 Peripheral vascular disease, unspecified: Principal | ICD-10-CM | POA: Insufficient documentation

## 2023-02-04 DIAGNOSIS — Z87891 Personal history of nicotine dependence: Secondary | ICD-10-CM | POA: Diagnosis not present

## 2023-02-04 DIAGNOSIS — J449 Chronic obstructive pulmonary disease, unspecified: Secondary | ICD-10-CM | POA: Diagnosis not present

## 2023-02-04 DIAGNOSIS — I10 Essential (primary) hypertension: Secondary | ICD-10-CM | POA: Diagnosis present

## 2023-02-04 DIAGNOSIS — K219 Gastro-esophageal reflux disease without esophagitis: Secondary | ICD-10-CM | POA: Diagnosis present

## 2023-02-04 DIAGNOSIS — J439 Emphysema, unspecified: Secondary | ICD-10-CM | POA: Diagnosis present

## 2023-02-04 DIAGNOSIS — E785 Hyperlipidemia, unspecified: Secondary | ICD-10-CM | POA: Diagnosis present

## 2023-02-04 HISTORY — PX: ABDOMINAL AORTOGRAM W/LOWER EXTREMITY: CATH118223

## 2023-02-04 HISTORY — PX: PERIPHERAL VASCULAR INTERVENTION: CATH118257

## 2023-02-04 LAB — POCT ACTIVATED CLOTTING TIME
Activated Clotting Time: 266 seconds
Activated Clotting Time: 266 seconds
Activated Clotting Time: 196 seconds
Activated Clotting Time: 179 seconds
Activated Clotting Time: 169 seconds

## 2023-02-04 LAB — GLUCOSE, CAPILLARY: Glucose-Capillary: 104 mg/dL — ABNORMAL HIGH (ref 70–99)

## 2023-02-04 SURGERY — ABDOMINAL AORTOGRAM W/LOWER EXTREMITY
Anesthesia: LOCAL | Laterality: Left

## 2023-02-04 MED ORDER — CLOPIDOGREL BISULFATE 300 MG PO TABS
ORAL_TABLET | ORAL | Status: DC | PRN
  Administered 2023-02-04: 300 mg via ORAL

## 2023-02-04 MED ORDER — HEPARIN (PORCINE) IN NACL 1000-0.9 UT/500ML-% IV SOLN
INTRAVENOUS | Status: DC | PRN
  Administered 2023-02-04 (×2): 500 mL

## 2023-02-04 MED ORDER — ASPIRIN 81 MG PO CHEW
CHEWABLE_TABLET | ORAL | Status: AC
  Administered 2023-02-04: 81 mg via ORAL
  Filled 2023-02-04: qty 1

## 2023-02-04 MED ORDER — FLUTICASONE FUROATE-VILANTEROL 200-25 MCG/ACT IN AEPB
1.0000 | INHALATION_SPRAY | Freq: Every day | RESPIRATORY_TRACT | Status: DC
  Administered 2023-02-05: 1 via RESPIRATORY_TRACT
  Filled 2023-02-04: qty 28

## 2023-02-04 MED ORDER — IODIXANOL 320 MG/ML IV SOLN
INTRAVENOUS | Status: DC | PRN
  Administered 2023-02-04: 140 mL

## 2023-02-04 MED ORDER — SODIUM CHLORIDE 0.9% FLUSH: 3.0000 mL | INTRAVENOUS | Status: DC | PRN

## 2023-02-04 MED ORDER — BUDESON-GLYCOPYRROL-FORMOTEROL 160-9-4.8 MCG/ACT IN AERO: 2.0000 | INHALATION_SPRAY | Freq: Two times a day (BID) | RESPIRATORY_TRACT | Status: DC

## 2023-02-04 MED ORDER — SODIUM CHLORIDE 0.9% FLUSH: 3.0000 mL | Freq: Two times a day (BID) | INTRAVENOUS | Status: DC

## 2023-02-04 MED ORDER — FENTANYL CITRATE (PF) 100 MCG/2ML IJ SOLN
INTRAMUSCULAR | Status: AC
  Filled 2023-02-04: qty 2

## 2023-02-04 MED ORDER — HEPARIN SODIUM (PORCINE) 1000 UNIT/ML IJ SOLN
INTRAMUSCULAR | Status: AC
  Filled 2023-02-04: qty 10

## 2023-02-04 MED ORDER — SODIUM CHLORIDE 0.9 % WEIGHT BASED INFUSION
1.0000 mL/kg/h | INTRAVENOUS | Status: AC
  Administered 2023-02-04: 1 mL/kg/h via INTRAVENOUS

## 2023-02-04 MED ORDER — FENTANYL CITRATE (PF) 100 MCG/2ML IJ SOLN
INTRAMUSCULAR | Status: AC
  Administered 2023-02-04: 50 ug via INTRAVENOUS
  Filled 2023-02-04: qty 2

## 2023-02-04 MED ORDER — SODIUM CHLORIDE 0.9 % IV SOLN: 250.0000 mL | INTRAVENOUS | Status: DC | PRN

## 2023-02-04 MED ORDER — ALBUTEROL SULFATE (2.5 MG/3ML) 0.083% IN NEBU: 2.5000 mg | INHALATION_SOLUTION | RESPIRATORY_TRACT | Status: DC | PRN

## 2023-02-04 MED ORDER — CLOPIDOGREL BISULFATE 75 MG PO TABS
75.0000 mg | ORAL_TABLET | Freq: Every day | ORAL | Status: DC
  Administered 2023-02-05: 75 mg via ORAL
  Filled 2023-02-04: qty 1

## 2023-02-04 MED ORDER — LIDOCAINE HCL (PF) 1 % IJ SOLN
INTRAMUSCULAR | Status: AC
  Filled 2023-02-04: qty 30

## 2023-02-04 MED ORDER — ACETAMINOPHEN 325 MG PO TABS
650.0000 mg | ORAL_TABLET | ORAL | Status: DC | PRN
  Administered 2023-02-04 – 2023-02-05 (×2): 650 mg via ORAL
  Filled 2023-02-04 (×2): qty 2

## 2023-02-04 MED ORDER — MIDAZOLAM HCL 2 MG/2ML IJ SOLN
INTRAMUSCULAR | Status: AC
  Filled 2023-02-04: qty 2

## 2023-02-04 MED ORDER — UMECLIDINIUM BROMIDE 62.5 MCG/ACT IN AEPB
1.0000 | INHALATION_SPRAY | Freq: Every day | RESPIRATORY_TRACT | Status: DC
  Filled 2023-02-04 (×2): qty 7

## 2023-02-04 MED ORDER — SALINE SPRAY 0.65 % NA SOLN: 1.0000 | NASAL | Status: DC | PRN

## 2023-02-04 MED ORDER — ASPIRIN 81 MG PO CHEW
81.0000 mg | CHEWABLE_TABLET | Freq: Every day | ORAL | Status: DC
  Administered 2023-02-05: 81 mg via ORAL
  Filled 2023-02-04: qty 1

## 2023-02-04 MED ORDER — LABETALOL HCL 5 MG/ML IV SOLN: 10.0000 mg | INTRAVENOUS | Status: DC | PRN

## 2023-02-04 MED ORDER — ONDANSETRON HCL 4 MG/2ML IJ SOLN: 4.0000 mg | Freq: Four times a day (QID) | INTRAMUSCULAR | Status: DC | PRN

## 2023-02-04 MED ORDER — METOPROLOL SUCCINATE ER 25 MG PO TB24
25.0000 mg | ORAL_TABLET | Freq: Every day | ORAL | Status: DC
  Administered 2023-02-04 – 2023-02-05 (×2): 25 mg via ORAL
  Filled 2023-02-04 (×2): qty 1

## 2023-02-04 MED ORDER — SODIUM CHLORIDE 0.9 % WEIGHT BASED INFUSION
3.0000 mL/kg/h | INTRAVENOUS | Status: DC
  Administered 2023-02-04: 3 mL/kg/h via INTRAVENOUS

## 2023-02-04 MED ORDER — FENTANYL CITRATE (PF) 100 MCG/2ML IJ SOLN: 50.0000 ug | Freq: Once | INTRAMUSCULAR | Status: AC

## 2023-02-04 MED ORDER — MIDAZOLAM HCL 2 MG/2ML IJ SOLN
INTRAMUSCULAR | Status: DC | PRN
  Administered 2023-02-04: 1 mg via INTRAVENOUS

## 2023-02-04 MED ORDER — SODIUM CHLORIDE 0.9% FLUSH
3.0000 mL | Freq: Two times a day (BID) | INTRAVENOUS | Status: DC
  Administered 2023-02-05: 3 mL via INTRAVENOUS

## 2023-02-04 MED ORDER — SODIUM CHLORIDE 0.9 % WEIGHT BASED INFUSION: 1.0000 mL/kg/h | INTRAVENOUS | Status: DC

## 2023-02-04 MED ORDER — HEPARIN SODIUM (PORCINE) 1000 UNIT/ML IJ SOLN
INTRAMUSCULAR | Status: DC | PRN
  Administered 2023-02-04 (×2): 2000 [IU] via INTRAVENOUS
  Administered 2023-02-04: 6000 [IU] via INTRAVENOUS

## 2023-02-04 MED ORDER — FUROSEMIDE 20 MG PO TABS
20.0000 mg | ORAL_TABLET | Freq: Every day | ORAL | Status: DC
  Administered 2023-02-04 – 2023-02-05 (×2): 20 mg via ORAL
  Filled 2023-02-04 (×2): qty 1

## 2023-02-04 MED ORDER — ASPIRIN 81 MG PO CHEW
81.0000 mg | CHEWABLE_TABLET | ORAL | Status: AC
  Administered 2023-02-04: 81 mg via ORAL
  Filled 2023-02-04: qty 1

## 2023-02-04 MED ORDER — ROSUVASTATIN CALCIUM 5 MG PO TABS
10.0000 mg | ORAL_TABLET | Freq: Every day | ORAL | Status: DC
  Administered 2023-02-04 – 2023-02-05 (×2): 10 mg via ORAL
  Filled 2023-02-04: qty 1
  Filled 2023-02-04 (×3): qty 2

## 2023-02-04 MED ORDER — PANTOPRAZOLE SODIUM 40 MG PO TBEC
40.0000 mg | DELAYED_RELEASE_TABLET | Freq: Every day | ORAL | Status: DC
  Administered 2023-02-04 – 2023-02-05 (×2): 40 mg via ORAL
  Filled 2023-02-04 (×2): qty 1

## 2023-02-04 MED ORDER — CLOPIDOGREL BISULFATE 300 MG PO TABS
ORAL_TABLET | ORAL | Status: AC
  Filled 2023-02-04: qty 1

## 2023-02-04 MED ORDER — FENTANYL CITRATE (PF) 100 MCG/2ML IJ SOLN
INTRAMUSCULAR | Status: DC | PRN
  Administered 2023-02-04: 25 ug via INTRAVENOUS
  Administered 2023-02-04: 50 ug via INTRAVENOUS

## 2023-02-04 MED ORDER — ALBUTEROL SULFATE HFA 108 (90 BASE) MCG/ACT IN AERS: 2.0000 | INHALATION_SPRAY | Freq: Four times a day (QID) | RESPIRATORY_TRACT | Status: DC | PRN

## 2023-02-04 SURGICAL SUPPLY — 26 items
BALLN MUSTANG 4.0X40 75 (BALLOONS) ×2
BALLN MUSTANG 7.0X40 75 (BALLOONS) ×2
BALLOON MUSTANG 4.0X40 75 (BALLOONS) IMPLANT
BALLOON MUSTANG 7.0X40 75 (BALLOONS) IMPLANT
CATH ANGIO 5F BER2 65CM (CATHETERS) IMPLANT
CATH ANGIO 5F PIGTAIL 65CM (CATHETERS) IMPLANT
CATH CROSS OVER TEMPO 5F (CATHETERS) IMPLANT
CATH STRAIGHT 5FR 65CM (CATHETERS) IMPLANT
DEVICE TORQUE .025-.038 (MISCELLANEOUS) IMPLANT
GUIDEWIRE ANGLED .035X150CM (WIRE) IMPLANT
KIT ENCORE 26 ADVANTAGE (KITS) IMPLANT
KIT MICROPUNCTURE NIT STIFF (SHEATH) IMPLANT
KIT PV (KITS) ×2 IMPLANT
SHEATH BRITE TIP 7FR 35CM (SHEATH) IMPLANT
SHEATH PINNACLE 5F 10CM (SHEATH) IMPLANT
SHEATH PINNACLE 7F 10CM (SHEATH) IMPLANT
SHEATH PROBE COVER 6X72 (BAG) IMPLANT
STENT INNOVA 8X20X130 (Permanent Stent) IMPLANT
STENT INNOVA 8X40X130 (Permanent Stent) IMPLANT
STOPCOCK MORSE 400PSI 3WAY (MISCELLANEOUS) IMPLANT
SYR MEDRAD MARK 7 150ML (SYRINGE) ×2 IMPLANT
TRANSDUCER W/STOPCOCK (MISCELLANEOUS) ×2 IMPLANT
TRAY PV CATH (CUSTOM PROCEDURE TRAY) ×2 IMPLANT
TUBING CIL FLEX 10 FLL-RA (TUBING) IMPLANT
WIRE HI TORQ VERSACORE 300 (WIRE) IMPLANT
WIRE STARTER BENTSON 035X150 (WIRE) IMPLANT

## 2023-02-04 NOTE — Progress Notes (Signed)
7 fr sheath in left femoral artery removed @ 1912 by this RN. ACT was 169 prior to sheath removal. Blood aspirated prior to sheath removal. No hematoma present at site. Site is clean, dry, intact. Manual pressure held for 20 minutes by this RN. Left DP pulse is palpable and 3+. Pt verbalized understanding of bedrest restrictions and instructions.

## 2023-02-04 NOTE — Progress Notes (Signed)
I consulted Dr. Stanford Breed for right common femoral artery endarterectomy.

## 2023-02-04 NOTE — Interval H&P Note (Signed)
History and Physical Interval Note:  02/04/2023 1:20 PM  [place]  has presented today for surgery, with the diagnosis of pad.  The various methods of treatment have been discussed with the patient and family. After consideration of risks, benefits and other options for treatment, the patient has consented to  Procedure(s): ABDOMINAL AORTOGRAM W/LOWER EXTREMITY (N/A) as a surgical intervention.  The patient's history has been reviewed, patient examined, no change in status, stable for surgery.  I have reviewed the patient's chart and labs.  Questions were answered to the patient's satisfaction.     Kathlyn Sacramento

## 2023-02-05 ENCOUNTER — Telehealth: Payer: Self-pay

## 2023-02-05 ENCOUNTER — Other Ambulatory Visit: Payer: Self-pay

## 2023-02-05 ENCOUNTER — Other Ambulatory Visit (HOSPITAL_COMMUNITY): Payer: Self-pay

## 2023-02-05 ENCOUNTER — Encounter (HOSPITAL_COMMUNITY): Payer: Self-pay | Admitting: Cardiovascular Disease

## 2023-02-05 DIAGNOSIS — J9611 Chronic respiratory failure with hypoxia: Secondary | ICD-10-CM | POA: Diagnosis not present

## 2023-02-05 DIAGNOSIS — I739 Peripheral vascular disease, unspecified: Secondary | ICD-10-CM | POA: Diagnosis not present

## 2023-02-05 DIAGNOSIS — I70221 Atherosclerosis of native arteries of extremities with rest pain, right leg: Secondary | ICD-10-CM | POA: Diagnosis not present

## 2023-02-05 DIAGNOSIS — J449 Chronic obstructive pulmonary disease, unspecified: Secondary | ICD-10-CM | POA: Diagnosis not present

## 2023-02-05 DIAGNOSIS — I2510 Atherosclerotic heart disease of native coronary artery without angina pectoris: Secondary | ICD-10-CM | POA: Diagnosis not present

## 2023-02-05 DIAGNOSIS — I509 Heart failure, unspecified: Secondary | ICD-10-CM | POA: Diagnosis not present

## 2023-02-05 DIAGNOSIS — I5032 Chronic diastolic (congestive) heart failure: Secondary | ICD-10-CM | POA: Diagnosis not present

## 2023-02-05 DIAGNOSIS — I110 Hypertensive heart disease with heart failure: Secondary | ICD-10-CM | POA: Diagnosis not present

## 2023-02-05 DIAGNOSIS — I998 Other disorder of circulatory system: Secondary | ICD-10-CM

## 2023-02-05 MED ORDER — HEPARIN SODIUM (PORCINE) 5000 UNIT/ML IJ SOLN: 5000.0000 [IU] | Freq: Three times a day (TID) | INTRAMUSCULAR | Status: DC

## 2023-02-05 MED ORDER — ROSUVASTATIN CALCIUM 40 MG PO TABS
40.0000 mg | ORAL_TABLET | Freq: Every day | ORAL | 3 refills | Status: DC
  Filled 2023-02-05: qty 90, 90d supply, fill #0

## 2023-02-05 MED ORDER — CLOPIDOGREL BISULFATE 75 MG PO TABS
75.0000 mg | ORAL_TABLET | Freq: Every day | ORAL | 3 refills | Status: DC
  Filled 2023-02-05: qty 90, 90d supply, fill #0

## 2023-02-05 MED ORDER — ASPIRIN 81 MG PO TBEC
81.0000 mg | DELAYED_RELEASE_TABLET | Freq: Every day | ORAL | 2 refills | Status: DC
  Filled 2023-02-05: qty 90, 90d supply, fill #0

## 2023-02-05 MED ORDER — PANTOPRAZOLE SODIUM 40 MG PO TBEC
40.0000 mg | DELAYED_RELEASE_TABLET | Freq: Every day | ORAL | 3 refills | Status: DC
  Filled 2023-02-05: qty 90, 90d supply, fill #0

## 2023-02-05 MED FILL — Midazolam HCl Inj 2 MG/2ML (Base Equivalent): INTRAMUSCULAR | Qty: 2 | Status: AC

## 2023-02-05 MED FILL — Fentanyl Citrate Preservative Free (PF) Inj 100 MCG/2ML: INTRAMUSCULAR | Qty: 2 | Status: AC

## 2023-02-05 MED FILL — Lidocaine HCl Local Preservative Free (PF) Inj 1%: INTRAMUSCULAR | Qty: 30 | Status: AC

## 2023-02-05 NOTE — Progress Notes (Signed)
Rounding Note    Patient Name: Chase Lloyd Date of Encounter: 02/05/2023  [place] Cardiologist: Kathlyn Sacramento, MD   Subjective   Admitted for PAD.  Status post left external iliac stenting.  Needs evaluation for right femoral endarterectomy by vascular surgery.  Left femoral cath site clean and dry.  No complaints.  No chest pains or trouble breathing.  Inpatient Medications    Scheduled Meds:  aspirin  81 mg Oral Daily   clopidogrel  75 mg Oral Q breakfast   fluticasone furoate-vilanterol  1 puff Inhalation Daily   And   umeclidinium bromide  1 puff Inhalation Daily   furosemide  20 mg Oral Daily   metoprolol succinate  25 mg Oral Daily   pantoprazole  40 mg Oral Daily   rosuvastatin  10 mg Oral Daily   sodium chloride flush  3 mL Intravenous Q12H   Continuous Infusions:  sodium chloride     PRN Meds: sodium chloride, acetaminophen, albuterol, labetalol, ondansetron (ZOFRAN) IV, sodium chloride, sodium chloride flush   Vital Signs    Vitals:   02/04/23 2300 02/05/23 0347 02/05/23 0727 02/05/23 0808  BP: (!) 134/45 (!) 116/48 (!) 120/53   Pulse: 80 82 87   Resp: 20 20 20    Temp: 98.4 F (36.9 C) 97.7 F (36.5 C) 97.7 F (36.5 C)   TempSrc: Oral Oral Oral   SpO2: 99% 98% 97% 97%  Weight:      Height:        Intake/Output Summary (Last 24 hours) at 02/05/2023 0912 Last data filed at 02/05/2023 0500 Gross per 24 hour  Intake 942 ml  Output 1700 ml  Net -758 ml      02/04/2023    9:09 AM 01/29/2023    9:36 AM 01/15/2023    8:14 AM  Last 3 Weights  Weight (lbs) 172 lb 170 lb 8 oz 174 lb  Weight (kg) 78.019 kg 77.338 kg 78.926 kg      Telemetry    Sinus rhythm with HR 80s - Personally Reviewed  ECG    No new tracings - Personally Reviewed  Physical Exam   GEN: No acute distress.   Neck: No JVD Cardiac: RRR, no murmurs, rubs, or gallops.  Respiratory: Clear to auscultation bilaterally. GI: Soft, nontender, non-distended  MS: No  edema; No deformity. Neuro:  Nonfocal  Psych: Normal affect  Ext: 2+ pulses in left femoral cath site, clean and dry, absent pulses in the lower extremities  Labs    High Sensitivity Troponin:  No results for input(s): "TROPONINIHS" in the last 720 hours.   Chemistry Recent Labs  Lab 01/29/23 1109  NA 138  K 3.7  CL 94*  CO2 30  GLUCOSE 123*  BUN 14  CREATININE 1.02  CALCIUM 8.9  GFRNONAA >60  ANIONGAP 14    Lipids No results for input(s): "CHOL", "TRIG", "HDL", "LABVLDL", "LDLCALC", "CHOLHDL" in the last 168 hours.  Hematology Recent Labs  Lab 01/29/23 1109  WBC 6.6  RBC 3.83*  HGB 12.1*  HCT 36.3*  MCV 94.8  MCH 31.6  MCHC 33.3  RDW 13.1  PLT 158   Thyroid No results for input(s): "TSH", "FREET4" in the last 168 hours.  BNPNo results for input(s): "BNP", "PROBNP" in the last 168 hours.  DDimer No results for input(s): "DDIMER" in the last 168 hours.   Radiology    PERIPHERAL VASCULAR CATHETERIZATION  Result Date: 02/04/2023 1.  Right lower extremity: Moderate external iliac  artery stenosis, occluded common femoral artery, occluded SFA with with reconstitution distally via collaterals from the profunda and two-vessel runoff below the knee. 2.  Left lower extremity: Severe stenosis affecting the left external iliac artery, moderate common femoral artery stenosis, moderate SFA stenosis and three-vessel runoff below the knee. 3.  Successful self-expanding stent placement to the left external iliac artery.  A second stent was added due to distal edge dissection. Recommendations: Dual antiplatelet therapy for at least 1 month. Will consult vascular surgery for right common femoral artery endarterectomy.    Cardiac Studies   Abdominal aortogram 02/04/23: 1.  Right lower extremity: Moderate external iliac artery stenosis, occluded common femoral artery, occluded SFA with with reconstitution distally via collaterals from the profunda and two-vessel runoff below the knee. 2.   Left lower extremity: Severe stenosis affecting the left external iliac artery, moderate common femoral artery stenosis, moderate SFA stenosis and three-vessel runoff below the knee. 3.  Successful self-expanding stent placement to the left external iliac artery.  A second stent was added due to distal edge dissection.   Recommendations: Dual antiplatelet therapy for at least 1 month. Will consult vascular surgery for right common femoral artery endarterectomy.   Echo 11/17/22:  1. Left ventricular ejection fraction, by estimation, is 55 to 60%. The  left ventricle has normal function. The left ventricle has no regional  wall motion abnormalities. Left ventricular diastolic parameters are  consistent with Grade I diastolic  dysfunction (impaired relaxation).   2. Right ventricular systolic function is normal. The right ventricular  size is normal. Tricuspid regurgitation signal is inadequate for assessing  PA pressure.   3. The mitral valve is normal in structure. Mild mitral valve  regurgitation. No evidence of mitral stenosis.   4. The aortic valve is tricuspid. There is mild calcification of the  aortic valve. Aortic valve regurgitation is moderate. No aortic stenosis  is present.   5. The inferior vena cava is normal in size with greater than 50%  respiratory variability, suggesting right atrial pressure of 3 mmHg.   6. A small pericardial effusion is present. The pericardial effusion is  circumferential.   Patient Profile     67 y.o. male with a history of PAD, CAD, HFpEF, moderate AI, HTN, HLD severe COPD and chronic respiratory failure, former smoker, and Hodgkin's disease who was admitted on 02/04/2023 for scheduled outpatient aortogram.  Status post intervention to the left external iliac artery.  Needs evaluation for right femoral endarterectomy.  Assessment & Plan    PAD - aortogram yesterday as above.  Status post intervention to the left external iliac artery. - plan for  VVS consult for right common femoral artery endarterectomy - continue DAPT with ASA and plavix   CAD - nonobstructive by heart cath 2020 following CCTA - risk factor modification - has quit smoking   Hypertension -BP improved today, only on toprol   Sinus tachycardia - on toprol - telemetry with HR in the 80s   Chronic diastolic heart failure - maintained on 20 mg lasix, BB  COPD on home 2 L -Stable   Hyperlipidemia with LDL goal < 70 11/11/2022: Cholesterol 194; HDL 43; LDL Cholesterol 132; Triglycerides 95; VLDL 19 Continue crestor, though was only on 10 mg  FEN -No intravenous fluids -Code: Full -DVT PPx: Heparin -Diet: Heart healthy -Disposition pending evaluation by vascular surgery   For questions or updates, please contact [place] Please consult [email or web address] for contact info under   [employer]. O'[place],  MD, [place]  [address] [place], [place] [address] [phone]  9:35 AM

## 2023-02-05 NOTE — Consult Note (Addendum)
Hospital Consult    Reason for Consult: rest pain RLE, needs surgical intervention Requesting Physician:  Dr. Fletcher Anon MRN #:  [id number]  History of Present Illness: This is a 67 y.o. male with pertinent medical history of HLD, HTN, COPD on home O2, CHF, CAD, and Peripheral artery disease with lifestyle limiting claudication of his right lower extremity. He explains that for several years he has had worsening pain/ tightness in his legs on ambulation. He walks with rolling walker and explains that at about 5 min of walking or less he has to stop and rest on his walking to alleviate his calf discomfort. It will resolve after prolonged rest and occurs again on continued ambulation. He does not really describe rest pain however he does say 2-3 nights a week he gets cramping in his leg from thigh down. He does not have any wounds. He does describe coldness in his right foot in particular and some decreased sensation compared to left leg. He has known ABI of  0.26 on the right and 0.60 on the left. Yesterday he was admitted and underwent Abdominal aortogram with lower extremity runoff by Dr. Fletcher Anon yesterday on 02/04/23. On the right lower extremity he was found to have moderate external iliac artery stenosis, occluded right common femoral artery, occluded SFA with distal reconstitution via collaterals from profunda and two vessel runoff. His left lower extremity disease included external iliac artery stenosis, moderate common femoral artery stenosis and moderate SFA stenosis with three vessel runoff. His left lower extremity was amenable to endovascular treatment with external iliac artery angioplasty and stenting. However, due to his occluded right common femoral artery vascular surgery was consulted for surgical management. He has been started on dual antiplatelet therapy. He also is managed on a statin. He is former smoker.   Past Medical History:  Diagnosis Date   Arthritis    Back pain    COPD (chronic  obstructive pulmonary disease)    DDD (degenerative disc disease), lumbar    Depression    Emphysema of lung    Empyema    2016  (was in [place])   GERD (gastroesophageal reflux disease)    Hearing loss of right ear    started 2001.     microphone in right ear ,  and hearing aid in left   Hodgkin's disease 1999   Hypotension    Meniere's disease    Pneumonia    Tinnitus     Past Surgical History:  Procedure Laterality Date   BACK SURGERY     BIOPSY  01/15/2023   Procedure: BIOPSY;  Surgeon: Sharyn Creamer, MD;  Location: [facility];  Service: Gastroenterology;;   CERVICAL FUSION  2016   COLONOSCOPY WITH PROPOFOL N/A 01/15/2023   Procedure: COLONOSCOPY WITH PROPOFOL;  Surgeon: Sharyn Creamer, MD;  Location: [facility];  Service: Gastroenterology;  Laterality: N/A;   ELBOW SURGERY     ESOPHAGOGASTRODUODENOSCOPY (EGD) WITH PROPOFOL N/A 01/15/2023   Procedure: ESOPHAGOGASTRODUODENOSCOPY (EGD) WITH PROPOFOL;  Surgeon: Sharyn Creamer, MD;  Location: [facility];  Service: Gastroenterology;  Laterality: N/A;   EYE SURGERY     IR THORACENTESIS ASP PLEURAL SPACE W/IMG GUIDE  10/08/2018   LAMINECTOMY  1999   lower back surgery     POLYPECTOMY  01/15/2023   Procedure: POLYPECTOMY;  Surgeon: Sharyn Creamer, MD;  Location: [facility];  Service: Gastroenterology;;   RIGHT/LEFT HEART CATH AND CORONARY ANGIOGRAPHY N/A 04/29/2019   Procedure: RIGHT/LEFT HEART CATH AND CORONARY ANGIOGRAPHY;  Surgeon: Troy Sine, MD;  Location: [facility];  Service: Cardiovascular;  Laterality: N/A;   TONSILLECTOMY      No Known Allergies  Prior to Admission medications   Medication Sig Start Date End Date Taking? Authorizing Provider  albuterol (PROVENTIL) (2.5 MG/3ML) 0.083% nebulizer solution Take 3 mLs (2.5 mg total) by nebulization every 4 (four) hours as needed for wheezing or shortness of breath. 10/20/22  Yes Margaretha Seeds, MD  albuterol (VENTOLIN HFA) 108 (90 Base) MCG/ACT inhaler  Inhale 2 puffs into the lungs every 6 (six) hours as needed for wheezing or shortness of breath. 12/22/22  Yes Margaretha Seeds, MD  aspirin 81 MG chewable tablet Chew 81 mg by mouth daily.   Yes [provider]  Budeson-Glycopyrrol-Formoterol (BREZTRI AEROSPHERE) 160-9-4.8 MCG/ACT AERO Inhale 2 puffs into the lungs 2 (two) times daily. 12/22/22  Yes Margaretha Seeds, MD  furosemide (LASIX) 20 MG tablet Take 20 mg by mouth daily.   Yes [provider]  ibuprofen (ADVIL) 800 MG tablet TAKE 1 TABLET BY MOUTH EVERY 8 HOURS AS NEEDED 01/09/23  Yes Nafziger, Tommi Rumps, NP  metoprolol succinate (TOPROL-XL) 25 MG 24 hr tablet Take 1 tablet (25 mg total) by mouth daily. 10/16/22  Yes Belva Crome, MD  omeprazole (PRILOSEC) 40 MG capsule Take 1 capsule by mouth twice daily 11/05/22  Yes Nafziger, Tommi Rumps, NP  rosuvastatin (CRESTOR) 10 MG tablet Take 1 tablet (10 mg total) by mouth daily. 10/16/22  Yes Belva Crome, MD  sodium chloride (OCEAN) 0.65 % SOLN nasal spray Place 1 spray into both nostrils as needed for congestion.    [provider]    Social History   Socioeconomic History   Marital status: Married    Spouse name: Not on file   Number of children: Not on file   Years of education: Not on file   Highest education level: Not on file  Occupational History   Occupation: Retired  Tobacco Use   Smoking status: Former    Packs/day: 1.50    Years: 47.00    Additional pack years: 0.00    Total pack years: 70.50    Types: Cigarettes    Quit date: 06/13/2018    Years since quitting: 4.6   Smokeless tobacco: Never  Vaping Use   Vaping Use: Never used  Substance and Sexual Activity   Alcohol use: Yes    Comment: 24 cans in a week   Drug use: No   Sexual activity: Not on file  Other Topics Concern   Not on file  Social History Narrative   Retied - worked as Furniture conservator/restorer       Social Determinants of Radio broadcast assistant Strain: [place]  (11/13/2022)   Overall  Financial Resource Strain (CARDIA)    Difficulty of Paying Living Expenses: Not hard at all  Food Insecurity: No Food Insecurity (02/04/2023)   Hunger Vital Sign    Worried About Running Out of Food in the Last Year: Never true    [place] in the Last Year: Never true  Transportation Needs: No Transportation Needs (02/04/2023)   PRAPARE - Hydrologist (Medical): No    Lack of Transportation (Non-Medical): No  Physical Activity: Insufficiently Active (11/13/2022)   Exercise Vital Sign    Days of Exercise per Week: 2 days    Minutes of Exercise per Session: 60 min  Stress: No Stress Concern Present (11/13/2022)  [employer] - Occupational Stress Questionnaire    Feeling of Stress : Not at all  Social Connections: Moderately Isolated (11/13/2022)   Social Connection and Isolation Panel [NHANES]    Frequency of Communication with Friends and Family: More than three times a week    Frequency of Social Gatherings with Friends and Family: More than three times a week    Attends Religious Services: Never    Marine scientist or Organizations: No    Attends Archivist Meetings: Never    Marital Status: Married  Human resources officer Violence: Not At Risk (02/04/2023)   Humiliation, Afraid, Rape, and Kick questionnaire    Fear of Current or Ex-Partner: No    Emotionally Abused: No    Physically Abused: No    Sexually Abused: No     Family History  Problem Relation Age of Onset   Hypertension Father    Diabetes Father    Testicular cancer Brother    Melanoma Brother     ROS: Otherwise negative unless mentioned in HPI  Physical Examination  Vitals:   02/04/23 2300 02/05/23 0347  BP: (!) 134/45 (!) 116/48  Pulse: 80 82  Resp: 20 20  Temp: 98.4 F (36.9 C) 97.7 F (36.5 C)  SpO2: 99% 98%   Body mass index is 23.99 kg/m.  General:  WDWN in NAD Gait: Not observed HENT: WNL, normocephalic Pulmonary: normal  non-labored breathing, without wheezing Cardiac: regular rate and rhythm Abdomen: soft Vascular Exam/Pulses: 2+ femoral pulses bilaterally, left femoral access site c/d/I without swelling or  hematoma. Left Dp and PT palpable. Right Pt palpable. Both feet warm, motor and sensation intact Extremities: without ischemic changes, without Gangrene , without cellulitis; without open wounds;  Musculoskeletal: no muscle wasting or atrophy  Neurologic: A&O X 3;  No focal weakness or paresthesias are detected; speech is fluent/normal Psychiatric:  The pt has Normal affect.  CBC    Component Value Date/Time   WBC 6.6 01/29/2023 1109   RBC 3.83 (L) 01/29/2023 1109   HGB 12.1 (L) 01/29/2023 1109   HGB 13.4 04/25/2019 1632   HCT 36.3 (L) 01/29/2023 1109   HCT 39.2 04/25/2019 1632   PLT 158 01/29/2023 1109   PLT 154 04/25/2019 1632   MCV 94.8 01/29/2023 1109   MCV 95 04/25/2019 1632   MCH 31.6 01/29/2023 1109   MCHC 33.3 01/29/2023 1109   RDW 13.1 01/29/2023 1109   RDW 14.4 04/25/2019 1632   LYMPHSABS 0.8 11/22/2021 1027   MONOABS 0.5 11/22/2021 1027   EOSABS 0.1 11/22/2021 1027   BASOSABS 0.0 11/22/2021 1027    BMET    Component Value Date/Time   NA 138 01/29/2023 1109   NA 139 04/25/2019 1632   K 3.7 01/29/2023 1109   CL 94 (L) 01/29/2023 1109   CO2 30 01/29/2023 1109   GLUCOSE 123 (H) 01/29/2023 1109   BUN 14 01/29/2023 1109   BUN 11 04/25/2019 1632   CREATININE 1.02 01/29/2023 1109   CALCIUM 8.9 01/29/2023 1109   GFRNONAA >60 01/29/2023 1109   GFRAA >60 07/30/2019 1259    COAGS: Lab Results  Component Value Date   INR 0.96 10/06/2018   INR 1.08 07/17/2018     Non-Invasive Vascular Imaging:   +-------+-----------+-----------+------------+------------+  ABI/TBIToday's ABIToday's TBIPrevious ABIPrevious TBI  +-------+-----------+-----------+------------+------------+  Right .26        .29        .99                        +-------+-----------+-----------+------------+------------+  Left  .60        .19        1.08                      +-------+-----------+-----------+------------+------------+   Statin:  Yes.   Beta Blocker:  Yes.   Aspirin:  Yes.   ACEI:  No. ARB:  No. CCB use:  No Other antiplatelets/anticoagulants:  Yes.   Plavix    ASSESSMENT/PLAN: This is a 67 y.o. male pertinent medical history of HLD, HTN, COPD on home O2, CHF, CAD, and Peripheral artery disease with lifestyle limiting claudication of his right lower extremity. Yesterday he was admitted and underwent Abdominal aortogram with lower extremity runoff by Dr. Fletcher Anon yesterday on 02/04/23. On the right lower extremity he was found to have moderate external iliac artery stenosis, occluded right common femoral artery, occluded SFA with distal reconstitution via collaterals from profunda and two vessel runoff. His left lower extremity disease included external iliac artery stenosis, moderate common femoral artery stenosis and moderate SFA stenosis with three vessel runoff. His left lower extremity was amenable to endovascular treatment with external iliac artery angioplasty and stenting. However, due to his occluded right common femoral artery vascular surgery was consulted for surgical management. He has been started on dual antiplatelet therapy. I discussed with patient right risks/ benefits of common femoral endarterectomy and also discussed that he may also require further surgical revascularization with right lower extremity bypass if his symptoms are not improved by endarterectomy alone. Dr. Stanford Breed will see patient later today to discuss further with him timing of his surgery.    Karoline Caldwell PA-C [facility] [phone] 02/05/2023  7:02 AM  VASCULAR STAFF ADDENDUM: I have independently interviewed and examined the patient. I agree with the above.  66YM with lifestyle limiting claudication and severe PAD. I think  he would benefit from a right femoral endarterectomy under spinal anesthesia if safe with DAPT from anesthesia perspective. Otherwise would need to do GETA given recent stenting and need for DAPT. Will look for time to do R CFA endarterectomy next week if possible.  Yevonne Aline. Stanford Breed, MD [facility] Phone Number: [phone] 02/05/2023 1:40 PM

## 2023-02-05 NOTE — Telephone Encounter (Signed)
Spoke with patient/wife. Scheduled surgery for 4/10. Instructions provided, including to continue ASA and Plavix. Voiced understanding and all questions answered.

## 2023-02-05 NOTE — Discharge Instructions (Signed)

## 2023-02-05 NOTE — Plan of Care (Signed)

## 2023-02-05 NOTE — H&P (View-Only) (Signed)
Hospital Consult    Reason for Consult: rest pain RLE, needs surgical intervention Requesting Physician:  Dr. Arida MRN #:  [id number]  History of Present Illness: This is a 66 y.o. male with pertinent medical history of HLD, HTN, COPD on home O2, CHF, CAD, and Peripheral artery disease with lifestyle limiting claudication of his right lower extremity. He explains that for several years he has had worsening pain/ tightness in his legs on ambulation. He walks with rolling walker and explains that at about 5 min of walking or less he has to stop and rest on his walking to alleviate his calf discomfort. It will resolve after prolonged rest and occurs again on continued ambulation. He does not really describe rest pain however he does say 2-3 nights a week he gets cramping in his leg from thigh down. He does not have any wounds. He does describe coldness in his right foot in particular and some decreased sensation compared to left leg. He has known ABI of  0.26 on the right and 0.60 on the left. Yesterday he was admitted and underwent Abdominal aortogram with lower extremity runoff by Dr. Arida yesterday on 02/04/23. On the right lower extremity he was found to have moderate external iliac artery stenosis, occluded right common femoral artery, occluded SFA with distal reconstitution via collaterals from profunda and two vessel runoff. His left lower extremity disease included external iliac artery stenosis, moderate common femoral artery stenosis and moderate SFA stenosis with three vessel runoff. His left lower extremity was amenable to endovascular treatment with external iliac artery angioplasty and stenting. However, due to his occluded right common femoral artery vascular surgery was consulted for surgical management. He has been started on dual antiplatelet therapy. He also is managed on a statin. He is former smoker.   Past Medical History:  Diagnosis Date   Arthritis    Back pain    COPD (chronic  obstructive pulmonary disease)    DDD (degenerative disc disease), lumbar    Depression    Emphysema of lung    Empyema    2016  (was in [place])   GERD (gastroesophageal reflux disease)    Hearing loss of right ear    started 2001.     microphone in right ear ,  and hearing aid in left   Hodgkin's disease 1999   Hypotension    Meniere's disease    Pneumonia    Tinnitus     Past Surgical History:  Procedure Laterality Date   BACK SURGERY     BIOPSY  01/15/2023   Procedure: BIOPSY;  Surgeon: Dorsey, Ying C, MD;  Location: [facility];  Service: Gastroenterology;;   CERVICAL FUSION  2016   COLONOSCOPY WITH PROPOFOL N/A 01/15/2023   Procedure: COLONOSCOPY WITH PROPOFOL;  Surgeon: Dorsey, Ying C, MD;  Location: [facility];  Service: Gastroenterology;  Laterality: N/A;   ELBOW SURGERY     ESOPHAGOGASTRODUODENOSCOPY (EGD) WITH PROPOFOL N/A 01/15/2023   Procedure: ESOPHAGOGASTRODUODENOSCOPY (EGD) WITH PROPOFOL;  Surgeon: Dorsey, Ying C, MD;  Location: [facility];  Service: Gastroenterology;  Laterality: N/A;   EYE SURGERY     IR THORACENTESIS ASP PLEURAL SPACE W/IMG GUIDE  10/08/2018   LAMINECTOMY  1999   lower back surgery     POLYPECTOMY  01/15/2023   Procedure: POLYPECTOMY;  Surgeon: Dorsey, Ying C, MD;  Location: [facility];  Service: Gastroenterology;;   RIGHT/LEFT HEART CATH AND CORONARY ANGIOGRAPHY N/A 04/29/2019   Procedure: RIGHT/LEFT HEART CATH AND CORONARY ANGIOGRAPHY;    Surgeon: Kelly, Nora Rooke A, MD;  Location: [facility];  Service: Cardiovascular;  Laterality: N/A;   TONSILLECTOMY      No Known Allergies  Prior to Admission medications   Medication Sig Start Date End Date Taking? Authorizing Provider  albuterol (PROVENTIL) (2.5 MG/3ML) 0.083% nebulizer solution Take 3 mLs (2.5 mg total) by nebulization every 4 (four) hours as needed for wheezing or shortness of breath. 10/20/22  Yes Ellison, Chi Jane, MD  albuterol (VENTOLIN HFA) 108 (90 Base) MCG/ACT inhaler  Inhale 2 puffs into the lungs every 6 (six) hours as needed for wheezing or shortness of breath. 12/22/22  Yes Ellison, Chi Jane, MD  aspirin 81 MG chewable tablet Chew 81 mg by mouth daily.   Yes [provider]  Budeson-Glycopyrrol-Formoterol (BREZTRI AEROSPHERE) 160-9-4.8 MCG/ACT AERO Inhale 2 puffs into the lungs 2 (two) times daily. 12/22/22  Yes Ellison, Chi Jane, MD  furosemide (LASIX) 20 MG tablet Take 20 mg by mouth daily.   Yes [provider]  ibuprofen (ADVIL) 800 MG tablet TAKE 1 TABLET BY MOUTH EVERY 8 HOURS AS NEEDED 01/09/23  Yes Nafziger, Cory, NP  metoprolol succinate (TOPROL-XL) 25 MG 24 hr tablet Take 1 tablet (25 mg total) by mouth daily. 10/16/22  Yes Smith, Henry W, MD  omeprazole (PRILOSEC) 40 MG capsule Take 1 capsule by mouth twice daily 11/05/22  Yes Nafziger, Cory, NP  rosuvastatin (CRESTOR) 10 MG tablet Take 1 tablet (10 mg total) by mouth daily. 10/16/22  Yes Smith, Henry W, MD  sodium chloride (OCEAN) 0.65 % SOLN nasal spray Place 1 spray into both nostrils as needed for congestion.    [provider]    Social History   Socioeconomic History   Marital status: Married    Spouse name: Not on file   Number of children: Not on file   Years of education: Not on file   Highest education level: Not on file  Occupational History   Occupation: Retired  Tobacco Use   Smoking status: Former    Packs/day: 1.50    Years: 47.00    Additional pack years: 0.00    Total pack years: 70.50    Types: Cigarettes    Quit date: 06/13/2018    Years since quitting: 4.6   Smokeless tobacco: Never  Vaping Use   Vaping Use: Never used  Substance and Sexual Activity   Alcohol use: Yes    Comment: 24 cans in a week   Drug use: No   Sexual activity: Not on file  Other Topics Concern   Not on file  Social History Narrative   Retied - worked as machinist       Social Determinants of Health   Financial Resource Strain: Low Risk  (11/13/2022)   Overall  Financial Resource Strain (CARDIA)    Difficulty of Paying Living Expenses: Not hard at all  Food Insecurity: No Food Insecurity (02/04/2023)   Hunger Vital Sign    Worried About Running Out of Food in the Last Year: Never true    Ran Out of Food in the Last Year: Never true  Transportation Needs: No Transportation Needs (02/04/2023)   PRAPARE - Transportation    Lack of Transportation (Medical): No    Lack of Transportation (Non-Medical): No  Physical Activity: Insufficiently Active (11/13/2022)   Exercise Vital Sign    Days of Exercise per Week: 2 days    Minutes of Exercise per Session: 60 min  Stress: No Stress Concern Present (11/13/2022)     [employer] - Occupational Stress Questionnaire    Feeling of Stress : Not at all  Social Connections: Moderately Isolated (11/13/2022)   Social Connection and Isolation Panel [NHANES]    Frequency of Communication with Friends and Family: More than three times a week    Frequency of Social Gatherings with Friends and Family: More than three times a week    Attends Religious Services: Never    Active Member of Clubs or Organizations: No    Attends Club or Organization Meetings: Never    Marital Status: Married  Intimate Partner Violence: Not At Risk (02/04/2023)   Humiliation, Afraid, Rape, and Kick questionnaire    Fear of Current or Ex-Partner: No    Emotionally Abused: No    Physically Abused: No    Sexually Abused: No     Family History  Problem Relation Age of Onset   Hypertension Father    Diabetes Father    Testicular cancer Brother    Melanoma Brother     ROS: Otherwise negative unless mentioned in HPI  Physical Examination  Vitals:   02/04/23 2300 02/05/23 0347  BP: (!) 134/45 (!) 116/48  Pulse: 80 82  Resp: 20 20  Temp: 98.4 F (36.9 C) 97.7 F (36.5 C)  SpO2: 99% 98%   Body mass index is 23.99 kg/m.  General:  WDWN in NAD Gait: Not observed HENT: WNL, normocephalic Pulmonary: normal  non-labored breathing, without wheezing Cardiac: regular rate and rhythm Abdomen: soft Vascular Exam/Pulses: 2+ femoral pulses bilaterally, left femoral access site c/d/I without swelling or  hematoma. Left Dp and PT palpable. Right Pt palpable. Both feet warm, motor and sensation intact Extremities: without ischemic changes, without Gangrene , without cellulitis; without open wounds;  Musculoskeletal: no muscle wasting or atrophy  Neurologic: A&O X 3;  No focal weakness or paresthesias are detected; speech is fluent/normal Psychiatric:  The pt has Normal affect.  CBC    Component Value Date/Time   WBC 6.6 01/29/2023 1109   RBC 3.83 (L) 01/29/2023 1109   HGB 12.1 (L) 01/29/2023 1109   HGB 13.4 04/25/2019 1632   HCT 36.3 (L) 01/29/2023 1109   HCT 39.2 04/25/2019 1632   PLT 158 01/29/2023 1109   PLT 154 04/25/2019 1632   MCV 94.8 01/29/2023 1109   MCV 95 04/25/2019 1632   MCH 31.6 01/29/2023 1109   MCHC 33.3 01/29/2023 1109   RDW 13.1 01/29/2023 1109   RDW 14.4 04/25/2019 1632   LYMPHSABS 0.8 11/22/2021 1027   MONOABS 0.5 11/22/2021 1027   EOSABS 0.1 11/22/2021 1027   BASOSABS 0.0 11/22/2021 1027    BMET    Component Value Date/Time   NA 138 01/29/2023 1109   NA 139 04/25/2019 1632   K 3.7 01/29/2023 1109   CL 94 (L) 01/29/2023 1109   CO2 30 01/29/2023 1109   GLUCOSE 123 (H) 01/29/2023 1109   BUN 14 01/29/2023 1109   BUN 11 04/25/2019 1632   CREATININE 1.02 01/29/2023 1109   CALCIUM 8.9 01/29/2023 1109   GFRNONAA >60 01/29/2023 1109   GFRAA >60 07/30/2019 1259    COAGS: Lab Results  Component Value Date   INR 0.96 10/06/2018   INR 1.08 07/17/2018     Non-Invasive Vascular Imaging:   +-------+-----------+-----------+------------+------------+  ABI/TBIToday's ABIToday's TBIPrevious ABIPrevious TBI  +-------+-----------+-----------+------------+------------+  Right .26        .29        .99                        +-------+-----------+-----------+------------+------------+    Left  .60        .19        1.08                      +-------+-----------+-----------+------------+------------+   Statin:  Yes.   Beta Blocker:  Yes.   Aspirin:  Yes.   ACEI:  No. ARB:  No. CCB use:  No Other antiplatelets/anticoagulants:  Yes.   Plavix    ASSESSMENT/PLAN: This is a 66 y.o. male pertinent medical history of HLD, HTN, COPD on home O2, CHF, CAD, and Peripheral artery disease with lifestyle limiting claudication of his right lower extremity. Yesterday he was admitted and underwent Abdominal aortogram with lower extremity runoff by Dr. Arida yesterday on 02/04/23. On the right lower extremity he was found to have moderate external iliac artery stenosis, occluded right common femoral artery, occluded SFA with distal reconstitution via collaterals from profunda and two vessel runoff. His left lower extremity disease included external iliac artery stenosis, moderate common femoral artery stenosis and moderate SFA stenosis with three vessel runoff. His left lower extremity was amenable to endovascular treatment with external iliac artery angioplasty and stenting. However, due to his occluded right common femoral artery vascular surgery was consulted for surgical management. He has been started on dual antiplatelet therapy. I discussed with patient right risks/ benefits of common femoral endarterectomy and also discussed that he may also require further surgical revascularization with right lower extremity bypass if his symptoms are not improved by endarterectomy alone. Dr. Harini Dearmond will see patient later today to discuss further with him timing of his surgery.    Corrina Baglia PA-C [facility] [phone] 02/05/2023  7:02 AM  VASCULAR STAFF ADDENDUM: I have independently interviewed and examined the patient. I agree with the above.  66YM with lifestyle limiting claudication and severe PAD. I think  he would benefit from a right femoral endarterectomy under spinal anesthesia if safe with DAPT from anesthesia perspective. Otherwise would need to do GETA given recent stenting and need for DAPT. Will look for time to do R CFA endarterectomy next week if possible.  Alexanderjames Berg N. Brion Hedges, MD FACS [facility] [place] Office Phone Number: [phone] 02/05/2023 1:40 PM    

## 2023-02-05 NOTE — Discharge Summary (Signed)
Discharge Summary    Patient ID: Chase Lloyd MRN: [id number]; DOB: Mar 16, 1956  Admit date: 02/04/2023 Discharge date: 02/05/2023  PCP:  [name], NP   [place] [facility] Cardiologist:  Kathlyn Sacramento, MD   Discharge Diagnoses    Principal Problem:   PAD (peripheral artery disease) Active Problems:   GERD (gastroesophageal reflux disease)   Essential hypertension   COPD with chronic bronchitis and emphysema   Chronic respiratory failure with hypoxia   CAD (coronary artery disease)   Abnormal cardiac CT angiography   Chronic heart failure with preserved ejection fraction (HFpEF)   Hyperlipidemia LDL goal <70   PVD (peripheral vascular disease)   Diagnostic Studies/Procedures    Abdominal aortogram 02/04/23: 1.  Right lower extremity: Moderate external iliac artery stenosis, occluded common femoral artery, occluded SFA with with reconstitution distally via collaterals from the profunda and two-vessel runoff below the knee. 2.  Left lower extremity: Severe stenosis affecting the left external iliac artery, moderate common femoral artery stenosis, moderate SFA stenosis and three-vessel runoff below the knee. 3.  Successful self-expanding stent placement to the left external iliac artery.  A second stent was added due to distal edge dissection.   Recommendations: Dual antiplatelet therapy for at least 1 month. Will consult vascular surgery for right common femoral artery endarterectomy.     Echo 11/17/22:  1. Left ventricular ejection fraction, by estimation, is 55 to 60%. The  left ventricle has normal function. The left ventricle has no regional  wall motion abnormalities. Left ventricular diastolic parameters are  consistent with Grade I diastolic  dysfunction (impaired relaxation).   2. Right ventricular systolic function is normal. The right ventricular  size is normal. Tricuspid regurgitation signal is inadequate for assessing  PA pressure.   3. The  mitral valve is normal in structure. Mild mitral valve  regurgitation. No evidence of mitral stenosis.   4. The aortic valve is tricuspid. There is mild calcification of the  aortic valve. Aortic valve regurgitation is moderate. No aortic stenosis  is present.   5. The inferior vena cava is normal in size with greater than 50%  respiratory variability, suggesting right atrial pressure of 3 mmHg.   6. A small pericardial effusion is present. The pericardial effusion is  circumferential.    _____________   History of Present Illness     Chase Lloyd is a 67 y.o. male with a history of PAD, CAD, HFpEF, moderate AI, HTN, HLD severe COPD and chronic respiratory failure, former smoker, and Hodgkin's disease who was admitted on 02/04/2023 for scheduled outpatient aortogram.  Status post intervention to the left external iliac artery.  Needs evaluation for right femoral endarterectomy.   He has known history of coronary artery disease with previous cardiac catheterization showing severely calcified coronary arteries with mild to moderate nonobstructive disease, chronic diastolic heart failure, moderate aortic insufficiency, essential hypertension, hyperlipidemia, previous back surgery, severe COPD on home oxygen and Hodgkin's disease.  The patient reports prolonged symptoms of severe bilateral calf claudication with minimal walking.  Recently, he started having symptoms at rest mostly at night affecting the right leg which include severe cramping in the calf, numbness and cold sensation.  No lower extremity ulceration.  He underwent recent noninvasive vascular testing which showed an ABI of 0.26 on the right and 0.60 on the left. Duplex showed near stenosis in the left external iliac artery with moderate stenosis in the left SFA.  On the right side, the common femoral artery  was occluded as well as the SFA.   He denies chest pain and no worsening dyspnea.  He quit smoking in 2019.  Hospital Course      Consultants: [facility]   PAD Pt presented for scheduled abdominal aortogram yesterday, as summarized above. He was treated with left external iliac artery stent. However, given right common femoral artery occlusion, [facility] was consulted for possible right CFA endarterectomy.  He was placed on DAPT with ASA and plavix.  Per [facility], will schedule for right CFA endarterectomy, hopefully next week.    CAD Nonobstructive by heart cath in 2020 following CCTA. Continue risk factor modification. He has quit smoking.    Hypertension  BP improved today, only on toprol   Hx of sinus tachycardia On toprol Telemetry with HR in the 80s.   Chronic diastolic heart failure Maintained no 20 mg lasix   COPD Chronic respiratory failure On 2L [place]   Hyperlipidemia with LDL goal < 70 11/11/2022: Cholesterol 194; HDL 43; LDL Cholesterol 132; Triglycerides 95; VLDL 19 Continue crestor, though was only on 10 mg --> will increase this to 40 mg   Pt was seen by Dr. Audie Box and deemed stable for discharge. Follow up has been arranged.    Did the patient have an acute coronary syndrome (MI, NSTEMI, STEMI, etc) this admission?:  No                               Did the patient have a percutaneous coronary intervention (stent / angioplasty)?:  No.        The patient will be scheduled for a TOC follow up appointment in 7-14 days.  A message has been sent to the [facility] and Scheduling Pool at the office where the patient should be seen for follow up.  _____________  Discharge Vitals Blood pressure (!) 137/43, pulse 83, temperature 97.8 F (36.6 C), temperature source Oral, resp. rate 18, height 5\' 11"  (1.803 m), weight 78 kg, SpO2 100 %.  Filed Weights   02/04/23 0909  Weight: 78 kg    Labs & Radiologic Studies    CBC No results for input(s): "WBC", "NEUTROABS", "HGB", "HCT", "MCV", "PLT" in the last 72 hours. Basic Metabolic Panel No results for input(s): "NA", "K", "CL", "CO2", "GLUCOSE", "BUN",  "CREATININE", "CALCIUM", "MG", "PHOS" in the last 72 hours. Liver Function Tests No results for input(s): "AST", "ALT", "ALKPHOS", "BILITOT", "PROT", "ALBUMIN" in the last 72 hours. No results for input(s): "LIPASE", "AMYLASE" in the last 72 hours. High Sensitivity Troponin:   No results for input(s): "TROPONINIHS" in the last 720 hours.  BNP Invalid input(s): "POCBNP" D-Dimer No results for input(s): "DDIMER" in the last 72 hours. Hemoglobin A1C No results for input(s): "HGBA1C" in the last 72 hours. Fasting Lipid Panel No results for input(s): "CHOL", "HDL", "LDLCALC", "TRIG", "CHOLHDL", "LDLDIRECT" in the last 72 hours. Thyroid Function Tests No results for input(s): "TSH", "T4TOTAL", "T3FREE", "THYROIDAB" in the last 72 hours.  Invalid input(s): "FREET3" _____________  PERIPHERAL VASCULAR CATHETERIZATION  Result Date: 02/04/2023 1.  Right lower extremity: Moderate external iliac artery stenosis, occluded common femoral artery, occluded SFA with with reconstitution distally via collaterals from the profunda and two-vessel runoff below the knee. 2.  Left lower extremity: Severe stenosis affecting the left external iliac artery, moderate common femoral artery stenosis, moderate SFA stenosis and three-vessel runoff below the knee. 3.  Successful self-expanding stent placement to the left external iliac artery.  A second stent was added due to distal edge dissection. Recommendations: Dual antiplatelet therapy for at least 1 month. Will consult vascular surgery for right common femoral artery endarterectomy.   VAS [name] LOWER EXTREMITY ARTERIAL DUPLEX  Result Date: 01/27/2023 LOWER EXTREMITY ARTERIAL DUPLEX STUDY Patient Name:  Chase Lloyd  Date of Exam:   01/26/2023 Medical Rec #: [id number]      Accession #:    [id number] Date of Birth: 1956/03/02     Patient Gender: M Patient Age:   89 years Exam Location:  [facility] Procedure:      VAS [name] LOWER EXTREMITY ARTERIAL DUPLEX Referring Phys: HEATHER  PEMBERTON --------------------------------------------------------------------------------  Indications: Claudication, and Patient has complained for years of bilateral leg              pain right > left after walking about 5-10 minutes. He complains of              calf cramping. He complains that his right foot feels cold and has              numbness for well over a year. He uses an upright walker to assist              due to lower back issues. He is on continuous oxygen. High Risk Factors: Hypertension, hyperlipidemia, past history of smoking,                    coronary artery disease. Other Factors: History of COPD                 SEE ABI REPORT.  Vascular Interventions: None. Current ABI:            Right .26 Left .64 Comparison Study: None Performing Technologist: Alecia Mackin RVT, RDCS (AE), RDMS  Examination Guidelines: A complete evaluation includes B-mode imaging, spectral Doppler, color Doppler, and power Doppler as needed of all accessible portions of each vessel. Bilateral testing is considered an integral part of a complete examination. Limited examinations for reoccurring indications may be performed as noted. Aorta: +--------+-------+----------+----------+--------+--------+---------------------+         AP (cm)Trans (cm)PSV (cm/s)WaveformThrombusShape                 +--------+-------+----------+----------+--------+--------+---------------------+ Proximal                                           not seen due to                                                          overlying bowel       +--------+-------+----------+----------+--------+--------+---------------------+ Distal  1.90   2.20      79        biphasic                              +--------+-------+----------+----------+--------+--------+---------------------+ Pattent IVC; severe atherosclerosis noted in limited segments seen of aorta.   +-----------+--------+-----+--------+----------+-------------------------------+ RIGHT      PSV cm/sRatioStenosisWaveform  Comments                        +-----------+--------+-----+--------+----------+-------------------------------+ CIA  Prox   88                   biphasic                                  +-----------+--------+-----+--------+----------+-------------------------------+ CIA Distal 136                  biphasic                                  +-----------+--------+-----+--------+----------+-------------------------------+ EIA Prox   91                   biphasic                                  +-----------+--------+-----+--------+----------+-------------------------------+ EIA Distal 85                   biphasic                                  +-----------+--------+-----+--------+----------+-------------------------------+ CFA Prox   72                   biphasic                                  +-----------+--------+-----+--------+----------+-------------------------------+ CFA Mid                                   calcified plaque, difficult to                                            differentiate vessel walls.                                               Possible occlusion              +-----------+--------+-----+--------+----------+-------------------------------+ CFA Distal                                Calcified plaque, difficult to                                            differentiate vessel walls.                                               Possible occlusion.             +-----------+--------+-----+--------+----------+-------------------------------+ DFA        104  monophasic                                +-----------+--------+-----+--------+----------+-------------------------------+ SFA Prox                occluded                                           +-----------+--------+-----+--------+----------+-------------------------------+ SFA Mid                 occluded          recannulated                    +-----------+--------+-----+--------+----------+-------------------------------+ SFA Distal 19                   monophasic                                +-----------+--------+-----+--------+----------+-------------------------------+ POP Prox   33                   monophasic                                +-----------+--------+-----+--------+----------+-------------------------------+ POP Distal 15                   monophasic                                +-----------+--------+-----+--------+----------+-------------------------------+ TP Trunk   20                   monophasic                                +-----------+--------+-----+--------+----------+-------------------------------+ ATA Prox   11                   monophasic                                +-----------+--------+-----+--------+----------+-------------------------------+ ATA Mid                                   not seen; could be occluded     +-----------+--------+-----+--------+----------+-------------------------------+ PTA Prox   19                   monophasic                                +-----------+--------+-----+--------+----------+-------------------------------+ PTA Distal 14                   monophasic                                +-----------+--------+-----+--------+----------+-------------------------------+ PERO Prox  13                   monophasic                                +-----------+--------+-----+--------+----------+-------------------------------+  PERO Distal9                    monophasic                                +-----------+--------+-----+--------+----------+-------------------------------+ Right pedal artery 121ms, category 3 moderate ischemia.   +-----------+--------+-----+---------------+---------+--------+ LEFT       PSV cm/sRatioStenosis       Waveform Comments +-----------+--------+-----+---------------+---------+--------+ CIA Prox   89                          biphasic          +-----------+--------+-----+---------------+---------+--------+ CIA Mid    128                         biphasic          +-----------+--------+-----+---------------+---------+--------+ CIA Distal 128                         biphasic          +-----------+--------+-----+---------------+---------+--------+ EIA Prox   561          75-99% stenosisbiphasic          +-----------+--------+-----+---------------+---------+--------+ EIA Mid    134                         biphasic          +-----------+--------+-----+---------------+---------+--------+ EIA Distal 72                          biphasic          +-----------+--------+-----+---------------+---------+--------+ CFA Prox   113                         biphasic          +-----------+--------+-----+---------------+---------+--------+ CFA Distal 87                          biphasic          +-----------+--------+-----+---------------+---------+--------+ DFA        71                          triphasic         +-----------+--------+-----+---------------+---------+--------+ SFA Prox   70                          biphasic          +-----------+--------+-----+---------------+---------+--------+ SFA Mid    201          50-74% stenosisbiphasic          +-----------+--------+-----+---------------+---------+--------+ SFA Distal 58                          biphasic          +-----------+--------+-----+---------------+---------+--------+ POP Prox   50                          biphasic          +-----------+--------+-----+---------------+---------+--------+ POP Distal 21  biphasic           +-----------+--------+-----+---------------+---------+--------+ TP Trunk   37                          biphasic          +-----------+--------+-----+---------------+---------+--------+ ATA Prox   34                          biphasic          +-----------+--------+-----+---------------+---------+--------+ ATA Distal 20                          biphasic          +-----------+--------+-----+---------------+---------+--------+ PTA Prox   56                          biphasic          +-----------+--------+-----+---------------+---------+--------+ PTA Distal 30                          biphasic          +-----------+--------+-----+---------------+---------+--------+ PERO Prox  41                          biphasic          +-----------+--------+-----+---------------+---------+--------+ PERO Distal19                          biphasic          +-----------+--------+-----+---------------+---------+--------+ A focal velocity elevation of 201 cm/s was obtained at MID SFA with a VR of 2.9. Findings are characteristic of 50-74% stenosis.  Summary: Right: Possible total occlusion noted in the mid-distal common femoral artery. Total occlusion noted in the proximal to mid superficial femoral artery with reconstitution of flow mid SFA Left: 75-99% stenosis noted in the external iliac segment. 50-74% stenosis noted in the superficial femoral artery.  See table(s) above for measurements and observations. Suggest Peripheral Vascular Consult. Patient scheduled to see Dr. Fletcher Anon 01/29/2023 at 10:20 am. Electronically signed by Carlyle Dolly MD on 01/27/2023 at 10:17:27 AM.    Final    VAS [name] ABI WITH/WO TBI  Result Date: 01/27/2023  LOWER EXTREMITY DOPPLER STUDY Patient Name:  Chase Lloyd  Date of Exam:   01/26/2023 Medical Rec #: [id number]      Accession #:    [id number] Date of Birth: 01/22/1956     Patient Gender: M Patient Age:   39 years Exam Location:  [facility] Procedure:       VAS [name] ABI WITH/WO TBI Referring Phys: HEATHER PEMBERTON --------------------------------------------------------------------------------  Indications: Claudication. Patient has complained for years of bilateral leg              pain right > left after walking about 5-10 minutes. He complains of              calf cramping. He complains that his right foot feels cold and has              numbness for well over a year. He uses an upright walker to assist              due to lower back issues. He is on continuous oxygen. High Risk Factors: Hypertension, hyperlipidemia, past history of smoking,  coronary artery disease. Other Factors: SEE BILATERAL LEG ARTERIAL DUPLEX REPORT                 History of COPD.  Vascular Interventions: None. Comparison Study: Prior ABI 06/30/2017 right .99 left 1.08 Performing Technologist: Salvadore Dom RVT  Examination Guidelines: A complete evaluation includes at minimum, Doppler waveform signals and systolic blood pressure reading at the level of bilateral brachial, anterior tibial, and posterior tibial arteries, when vessel segments are accessible. Bilateral testing is considered an integral part of a complete examination. Photoelectric Plethysmograph (PPG) waveforms and toe systolic pressure readings are included as required and additional duplex testing as needed. Limited examinations for reoccurring indications may be performed as noted.  ABI Findings: +---------+------------------+-----+----------+--------+ Right    Rt Pressure (mmHg)IndexWaveform  Comment  +---------+------------------+-----+----------+--------+ Brachial 176                                       +---------+------------------+-----+----------+--------+ ATA                             absent             +---------+------------------+-----+----------+--------+ PTA      48                0.26 monophasic         +---------+------------------+-----+----------+--------+ PERO                             absent             +---------+------------------+-----+----------+--------+ Great Toe54                0.29 Abnormal           +---------+------------------+-----+----------+--------+ +---------+------------------+-----+--------+-------+ Left     Lt Pressure (mmHg)IndexWaveformComment +---------+------------------+-----+--------+-------+ Brachial 187                                    +---------+------------------+-----+--------+-------+ PTA      113               0.60 biphasic        +---------+------------------+-----+--------+-------+ DP       99                0.53 biphasic        +---------+------------------+-----+--------+-------+ Great Toe36                0.19 Abnormal        +---------+------------------+-----+--------+-------+ +-------+-----------+-----------+------------+------------+ ABI/TBIToday's ABIToday's TBIPrevious ABIPrevious TBI +-------+-----------+-----------+------------+------------+ Right  .26        .29        .99                      +-------+-----------+-----------+------------+------------+ Left   .60        .19        1.08                     +-------+-----------+-----------+------------+------------+  Bilateral ABIs appear decreased compared to prior study on 06/22/2017.  Summary: Right: Resting right ankle-brachial index indicates critical limb ischemia. The right toe-brachial index is abnormal. Left: Resting left ankle-brachial index indicates moderate left lower extremity arterial disease. The left toe-brachial index is  abnormal. *See table(s) above for measurements and observations.  Suggest Peripheral Vascular Consult. Electronically signed by Carlyle Dolly MD on 01/27/2023 at 10:03:55 AM.    Final    CARDIAC EVENT MONITOR  Result Date: 01/22/2023   Patient monitoring period was from 12/19/22-01/17/23   Predominant rhythm was NSR with average HR 80bpm   Rare SVE and VE (<1%)   No sustained  arrhythmias or significant pauses   Overall, normal cardiac monitor Gwyndolyn Kaufman, MD   Disposition   Pt is being discharged home today in good condition.  Follow-up Plans & Appointments     Follow-up Information     Wellington Hampshire, MD Follow up on 03/10/2023.   Specialty: Cardiology Why: 10:40 am for post procedure follow up Contact information: [address] [place] [address] [phone]                Discharge Instructions     Diet - low sodium heart healthy   Complete by: As directed    Discharge instructions   Complete by: As directed    No driving for 2 days. No lifting over 5 lbs for 1 week. No sexual activity for 1 week.Marland Kitchen Keep procedure site clean & dry. If you notice increased pain, swelling, bleeding or pus, call/return!  You may shower, but no soaking baths/hot tubs/pools for 1 week.   Increase activity slowly   Complete by: As directed        Discharge Medications   Allergies as of 02/05/2023   No Known Allergies      Medication List     STOP taking these medications    aspirin 81 MG chewable tablet Replaced by: aspirin EC 81 MG tablet   ibuprofen 800 MG tablet Commonly known as: ADVIL   omeprazole 40 MG capsule Commonly known as: PRILOSEC       TAKE these medications    albuterol (2.5 MG/3ML) 0.083% nebulizer solution Commonly known as: PROVENTIL Take 3 mLs (2.5 mg total) by nebulization every 4 (four) hours as needed for wheezing or shortness of breath.   albuterol 108 (90 Base) MCG/ACT inhaler Commonly known as: VENTOLIN HFA Inhale 2 puffs into the lungs every 6 (six) hours as needed for wheezing or shortness of breath.   aspirin EC 81 MG tablet Take 1 tablet (81 mg total) by mouth daily. Swallow whole. Replaces: aspirin 81 MG chewable tablet   Breztri Aerosphere 160-9-4.8 MCG/ACT Aero Generic drug: Budeson-Glycopyrrol-Formoterol Inhale 2 puffs into the lungs 2 (two) times daily.   clopidogrel 75 MG  tablet Commonly known as: PLAVIX Take 1 tablet (75 mg total) by mouth daily with breakfast. Start taking on: February 06, 2023   furosemide 20 MG tablet Commonly known as: LASIX Take 20 mg by mouth daily.   metoprolol succinate 25 MG 24 hr tablet Commonly known as: TOPROL-XL Take 1 tablet (25 mg total) by mouth daily.   pantoprazole 40 MG tablet Commonly known as: PROTONIX Take 1 tablet (40 mg total) by mouth daily. Start taking on: February 06, 2023   rosuvastatin 40 MG tablet Commonly known as: CRESTOR Take 1 tablet (40 mg total) by mouth daily. What changed:  medication strength how much to take   sodium chloride 0.65 % Soln nasal spray Commonly known as: OCEAN Place 1 spray into both nostrils as needed for congestion.           Outstanding Labs/Studies   Close follow up with [facility]  Duration of Discharge Encounter  Greater than 30 minutes including physician time.  Signed, [place], PA 02/05/2023, 2:23 PM

## 2023-02-05 NOTE — Telephone Encounter (Signed)
-----   Message from Cherre Robins, MD sent at 02/05/2023  3:24 PM EDT ----- Regarding: RE: Please advise Yes please  ----- Message ----- From: Nicholas Lose, RN Sent: 02/05/2023   3:13 PM EDT To: Cherre Robins, MD Subject: Please advise                                  I see patient was started on DAPT. Do you want him to continue Plavix?   [name] ----- Message ----- From: Cherre Robins, MD Sent: 02/05/2023   1:43 PM EDT To: Nicholas Lose, RN; [facility]  Please schedule for right femoral endarterectomy with me next week. Would like to do spinal anesthesia or epidural anesthesia if possible. Thanks. Chase Lloyd

## 2023-02-07 ENCOUNTER — Emergency Department (HOSPITAL_COMMUNITY)
Admission: EM | Admit: 2023-02-07 | Discharge: 2023-02-07 | Disposition: A | Discharge status: Home / Self Care | Payer: Medicare Other | Attending: Emergency Medicine | Admitting: Emergency Medicine

## 2023-02-07 ENCOUNTER — Emergency Department (HOSPITAL_COMMUNITY): Payer: Medicare Other

## 2023-02-07 ENCOUNTER — Encounter (HOSPITAL_COMMUNITY): Payer: Self-pay

## 2023-02-07 ENCOUNTER — Other Ambulatory Visit: Payer: Self-pay

## 2023-02-07 DIAGNOSIS — I10 Essential (primary) hypertension: Secondary | ICD-10-CM | POA: Diagnosis not present

## 2023-02-07 DIAGNOSIS — R079 Chest pain, unspecified: Secondary | ICD-10-CM

## 2023-02-07 DIAGNOSIS — E876 Hypokalemia: Secondary | ICD-10-CM

## 2023-02-07 DIAGNOSIS — J449 Chronic obstructive pulmonary disease, unspecified: Secondary | ICD-10-CM | POA: Insufficient documentation

## 2023-02-07 DIAGNOSIS — Z7902 Long term (current) use of antithrombotics/antiplatelets: Secondary | ICD-10-CM | POA: Diagnosis not present

## 2023-02-07 DIAGNOSIS — I2510 Atherosclerotic heart disease of native coronary artery without angina pectoris: Secondary | ICD-10-CM | POA: Diagnosis not present

## 2023-02-07 DIAGNOSIS — Z79899 Other long term (current) drug therapy: Secondary | ICD-10-CM | POA: Insufficient documentation

## 2023-02-07 DIAGNOSIS — R0789 Other chest pain: Secondary | ICD-10-CM | POA: Diagnosis not present

## 2023-02-07 DIAGNOSIS — J90 Pleural effusion, not elsewhere classified: Secondary | ICD-10-CM | POA: Diagnosis not present

## 2023-02-07 DIAGNOSIS — Z7982 Long term (current) use of aspirin: Secondary | ICD-10-CM | POA: Insufficient documentation

## 2023-02-07 DIAGNOSIS — J439 Emphysema, unspecified: Secondary | ICD-10-CM | POA: Diagnosis not present

## 2023-02-07 DIAGNOSIS — R7989 Other specified abnormal findings of blood chemistry: Secondary | ICD-10-CM | POA: Diagnosis not present

## 2023-02-07 LAB — CBC WITH DIFFERENTIAL/PLATELET
Abs Immature Granulocytes: 0.02 10*3/uL (ref 0.00–0.07)
Basophils Absolute: 0 10*3/uL (ref 0.0–0.1)
Basophils Relative: 1 %
Eosinophils Absolute: 0.1 10*3/uL (ref 0.0–0.5)
Eosinophils Relative: 2 %
HCT: 30.4 % — ABNORMAL LOW (ref 39.0–52.0)
Hemoglobin: 10 g/dL — ABNORMAL LOW (ref 13.0–17.0)
Immature Granulocytes: 0 %
Lymphocytes Relative: 15 %
Lymphs Abs: 0.7 10*3/uL (ref 0.7–4.0)
MCH: 32.1 pg (ref 26.0–34.0)
MCHC: 32.9 g/dL (ref 30.0–36.0)
MCV: 97.4 fL (ref 80.0–100.0)
Monocytes Absolute: 0.4 10*3/uL (ref 0.1–1.0)
Monocytes Relative: 8 %
Neutro Abs: 3.3 10*3/uL (ref 1.7–7.7)
Neutrophils Relative %: 74 %
Platelets: 112 10*3/uL — ABNORMAL LOW (ref 150–400)
RBC: 3.12 MIL/uL — ABNORMAL LOW (ref 4.22–5.81)
RDW: 13 % (ref 11.5–15.5)
WBC: 4.5 10*3/uL (ref 4.0–10.5)
nRBC: 0 % (ref 0.0–0.2)

## 2023-02-07 LAB — BASIC METABOLIC PANEL
Anion gap: 9 (ref 5–15)
BUN: 6 mg/dL — ABNORMAL LOW (ref 8–23)
CO2: 28 mmol/L (ref 22–32)
Calcium: 8.3 mg/dL — ABNORMAL LOW (ref 8.9–10.3)
Chloride: 99 mmol/L (ref 98–111)
Creatinine, Ser: 1.07 mg/dL (ref 0.61–1.24)
GFR, Estimated: >60 mL/min (ref >60)
Glucose, Bld: 92 mg/dL (ref 70–99)
Potassium: 2.8 mmol/L — ABNORMAL LOW (ref 3.5–5.1)
Sodium: 136 mmol/L (ref 135–145)

## 2023-02-07 LAB — D-DIMER, QUANTITATIVE: D-Dimer, Quant: 1.95 ug/mL-FEU — ABNORMAL HIGH (ref 0.00–0.50)

## 2023-02-07 LAB — TROPONIN I (HIGH SENSITIVITY)
Troponin I (High Sensitivity): 14 ng/L (ref <18)
Troponin I (High Sensitivity): 19 ng/L — ABNORMAL HIGH (ref <18)

## 2023-02-07 LAB — PROTIME-INR
INR: 1 (ref 0.8–1.2)
Prothrombin Time: 13.3 seconds (ref 11.4–15.2)

## 2023-02-07 MED ORDER — IOHEXOL 350 MG/ML SOLN
50.0000 mL | Freq: Once | INTRAVENOUS | Status: AC | PRN
  Administered 2023-02-07: 50 mL via INTRAVENOUS

## 2023-02-07 MED ORDER — LEVOFLOXACIN 750 MG PO TABS: 750.0000 mg | ORAL_TABLET | Freq: Every day | ORAL | 0 refills | Status: DC

## 2023-02-07 MED ORDER — POTASSIUM CHLORIDE CRYS ER 20 MEQ PO TBCR
40.0000 meq | EXTENDED_RELEASE_TABLET | Freq: Once | ORAL | Status: AC
  Administered 2023-02-07: 40 meq via ORAL
  Filled 2023-02-07: qty 2

## 2023-02-07 NOTE — Discharge Instructions (Addendum)
You were seen in the emergency department for some left-sided chest pain.  You had blood work EKG and a CAT scan of your chest that did not show a definite cause of your symptoms.  There was no evidence of blood clot or heart attack.  Your CAT scan did show some signs of pneumonia which you have been treated for in the past.  It is not clear if this is residual pneumonia or something new.  Follow-up with your primary care doctor and your lung specialist.  Return to the emergency department if any worsening or concerning symptoms.  A prescription for Levaquin was sent to the pharmacy in case you feel like your symptoms have worsened.

## 2023-02-07 NOTE — ED Triage Notes (Signed)
Pt to ED via EMS c/o intermittent CP onset today. Pt states while resting he gets sharp stabbing pains in chest that last about 1 minute. He reports that he has 2 stents placed in his left leg for arterial blockage. Pt denies CP at this time, denies SOB.  Received 324mg  of ASA PTA.

## 2023-02-07 NOTE — ED Provider Notes (Signed)
[place] EMERGENCY DEPARTMENT AT [facility] Provider Note   CSN: [id number] Arrival date & time: 02/07/23  1745     History {Add pertinent medical, surgical, social history, OB history to HPI:1} Chief Complaint  Patient presents with   Chest Pain    Chase Lloyd is a 67 y.o. male.  He has a history of COPD on 2 L.  He just had a stent placed in his left external iliac artery and he was due to have further surgery next week.  He has been doing well from that standpoint at home but today at rest he began experiencing multiple episodes of sharp stabbing left upper chest pain.  It did not seem to be related to movement or taking a deep breath.  He does not feel short more short of breath than normal.  He said the pain is intense when he gets it and lasts for maybe a minute or so.  He has never had this before.  He is on Plavix.  The history is provided by the patient.  Chest Pain Pain location:  L chest Pain quality: sharp and stabbing   Pain radiates to:  Does not radiate Pain severity:  Severe Onset quality:  Gradual Duration:  1 day Timing:  Sporadic Progression:  Unchanged Chronicity:  New Context: at rest   Relieved by:  None tried Worsened by:  Nothing Ineffective treatments:  None tried Associated symptoms: no abdominal pain, no cough, no diaphoresis, no fever, no nausea, no shortness of breath and no vomiting   Risk factors: coronary artery disease, hypertension and surgery        Home Medications Prior to Admission medications   Medication Sig Start Date End Date Taking? Authorizing Provider  albuterol (PROVENTIL) (2.5 MG/3ML) 0.083% nebulizer solution Take 3 mLs (2.5 mg total) by nebulization every 4 (four) hours as needed for wheezing or shortness of breath. 10/20/22   Luciano Cutter, MD  albuterol (VENTOLIN HFA) 108 (90 Base) MCG/ACT inhaler Inhale 2 puffs into the lungs every 6 (six) hours as needed for wheezing or shortness of breath. 12/22/22    Luciano Cutter, MD  aspirin EC 81 MG tablet Take 1 tablet (81 mg total) by mouth daily. Swallow whole. 02/05/23 02/05/24  Marcelino Duster, PA  Budeson-Glycopyrrol-Formoterol (BREZTRI AEROSPHERE) 160-9-4.8 MCG/ACT AERO Inhale 2 puffs into the lungs 2 (two) times daily. Patient taking differently: Inhale 2 puffs into the lungs in the morning. 12/22/22   Luciano Cutter, MD  clopidogrel (PLAVIX) 75 MG tablet Take 1 tablet (75 mg total) by mouth daily with breakfast. 02/06/23   Duke, Roe Rutherford, PA  isosorbide mononitrate (IMDUR) 30 MG 24 hr tablet Take 30 mg by mouth in the morning.    [provider]  metoprolol succinate (TOPROL-XL) 25 MG 24 hr tablet Take 1 tablet (25 mg total) by mouth daily. 10/16/22   Lyn Records, MD  pantoprazole (PROTONIX) 40 MG tablet Take 1 tablet (40 mg total) by mouth daily. 02/06/23   [facility], Roe Rutherford, PA  rosuvastatin (CRESTOR) 40 MG tablet Take 1 tablet (40 mg total) by mouth daily. 02/05/23   Marcelino Duster, PA      Allergies    Patient has no known allergies.    Review of Systems   Review of Systems  Constitutional:  Negative for diaphoresis and fever.  Respiratory:  Negative for cough and shortness of breath.   Cardiovascular:  Positive for chest pain.  Gastrointestinal:  Negative  for abdominal pain, nausea and vomiting.    Physical Exam Updated Vital Signs BP (!) 207/70   Pulse 88   Temp 98.3 F (36.8 C) (Oral)   Resp (!) 22   SpO2 91%  Physical Exam Vitals and nursing note reviewed.  Constitutional:      General: He is not in acute distress.    Appearance: He is well-developed.  HENT:     Head: Normocephalic and atraumatic.  Eyes:     Conjunctiva/sclera: Conjunctivae normal.  Cardiovascular:     Rate and Rhythm: Normal rate and regular rhythm.     Heart sounds: Normal heart sounds. No murmur heard. Pulmonary:     Effort: Pulmonary effort is normal. No respiratory distress.     Breath sounds: Normal breath sounds.   Abdominal:     Palpations: Abdomen is soft.     Tenderness: There is no abdominal tenderness.  Musculoskeletal:        General: No swelling. Normal range of motion.     Cervical back: Neck supple.     Right lower leg: No tenderness.     Left lower leg: No tenderness.  Skin:    General: Skin is warm and dry.     Capillary Refill: Capillary refill takes less than 2 seconds.  Neurological:     General: No focal deficit present.     Mental Status: He is alert.     ED Results / Procedures / Treatments   Labs (all labs ordered are listed, but only abnormal results are displayed) Labs Reviewed  BASIC METABOLIC PANEL  CBC WITH DIFFERENTIAL/PLATELET  D-DIMER, QUANTITATIVE  PROTIME-INR  TROPONIN I (HIGH SENSITIVITY)    EKG None  Radiology No results found.  Procedures Procedures  {Document cardiac monitor, telemetry assessment procedure when appropriate:1}  Medications Ordered in ED Medications - No data to display  ED Course/ Medical Decision Making/ A&P   {   Click here for ABCD2, HEART and other calculatorsREFRESH Note before signing :1}                          Medical Decision Making Amount and/or Complexity of Data Reviewed Labs: ordered. Radiology: ordered.   This patient complains of ***; this involves an extensive number of treatment Options and is a complaint that carries with it a high risk of complications and morbidity. The differential includes ***  I ordered, reviewed and interpreted labs, which included *** I ordered medication *** and reviewed PMP when indicated. I ordered imaging studies which included *** and I independently    visualized and interpreted imaging which showed *** Additional history obtained from *** Previous records obtained and reviewed *** I consulted *** and discussed lab and imaging findings and discussed disposition.  Cardiac monitoring reviewed, *** Social determinants considered, *** Critical Interventions:  ***  After the interventions stated above, I reevaluated the patient and found *** Admission and further testing considered, ***   {Document critical care time when appropriate:1} {Document review of labs and clinical decision tools ie heart score, Chads2Vasc2 etc:1}  {Document your independent review of radiology images, and any outside records:1} {Document your discussion with family members, caretakers, and with consultants:1} {Document social determinants of health affecting pt's care:1} {Document your decision making why or why not admission, treatments were needed:1} Final Clinical Impression(s) / ED Diagnoses Final diagnoses:  None    Rx / DC Orders ED Discharge Orders     None

## 2023-02-09 ENCOUNTER — Encounter (HOSPITAL_COMMUNITY): Payer: Self-pay | Admitting: Vascular Surgery

## 2023-02-09 NOTE — Progress Notes (Addendum)
Anesthesia Chart Review:  Pt is a same day work up   Case: [id number] Date/Time: 02/11/23 1223   Procedure: ENDARTERECTOMY FEMORAL (Right)   Anesthesia type: Choice   Pre-op diagnosis: Atherosclerosis of right lower extremity with rest pain   [facility] / [facility]   Surgeons: [name], MD       DISCUSSION: Pt is 67 years old with hx CAD (multivessel mild to moderate disease on 2020 cath), HFpEF, moderate aortic insufficiency, PAD (s/p left external iliac artery stents x2 02/04/23), COPD (uses 2L O2),   ED visit 02/07/23 for chest pain. CTA negative for PE. Troponin mildly elevated at 14, repeat was 19. Suspected not to be due to cardiac problem.   Pt started on plavix 02/04/23 and is to continue for 1 month post stenting. Per notes by Lucianne Lei, RN at Dr. Verita Lamb office, pt to continue plavix perioperatively. I let Georgianne Fick know pt will not be able to have spinal anesthesia on plavix.   I will message Dr. [name] about change in hgb, platelets, and K    PROVIDERS: - PCP is Nafziger, Kandee Keen, NP - Used to see Dr. Katrinka Blazing with cardiology who is now retired. Last office visit 12/10/22 with Tereso Newcomer, PA  LABS: Are ordered to be obtained day of surgery  - PT 02/07/23 13.3 - CBC w/diff 02/07/23: hgb 10.0 (dropped from 12.1 on 01/29/23), platelets 112 (dropped from 158 on 01/29/23)   - BMP 02/07/23: K 2.8 (dropped from 3.7 on 01/29/23), calcium 8.3 (down from 8.9)  IMAGES: CTA chest 02/07/23:  - There is no evidence of pulmonary artery embolism. There is no evidence of thoracic aortic dissection. Extensive coronary artery calcifications are seen. Small pericardial effusion is present. - COPD. There are patchy infiltrates in left upper lobe, lingula and left lower lobe with possible slight improvement in some of the infiltrates and possible slight worsening in some of the infiltrates. There are patchy infiltrates in right lung with possible increase in size of infiltrate in the posterior  segment of right upper lobe. Findings suggest multifocal pneumonia. Small to moderate bilateral pleural effusions are seen, more so on the right side.  1 view CXR 02/07/23:  1. Small RIGHT greater than LEFT pleural effusions. 2. Mildly increased bibasilar reticular nodularity and interstitial markings. Differential considerations include pulmonary edema versus atypical infection.   EKG 02/07/23:  sinus rhythm   CV: Peripheral vascular catheterization 02/04/23:  1.  Right lower extremity: Moderate external iliac artery stenosis, occluded common femoral artery, occluded SFA with with reconstitution distally via collaterals from the profunda and two-vessel runoff below the knee. 2.  Left lower extremity: Severe stenosis affecting the left external iliac artery, moderate common femoral artery stenosis, moderate SFA stenosis and three-vessel runoff below the knee. 3.  Successful self-expanding stent placement to the left external iliac artery.  A second stent was added due to distal edge dissection.   Cardiac monitor 01/21/23:    Patient monitoring period was from 12/19/22-01/17/23   Predominant rhythm was NSR with average HR 80bpm   Rare SVE and VE (<1%)   No sustained arrhythmias or significant pauses   Overall, normal cardiac monitor  Echo 11/17/22:  1. Left ventricular ejection fraction, by estimation, is 55 to 60%. The left ventricle has normal function. The left ventricle has no regional wall motion abnormalities. Left ventricular diastolic parameters are consistent with Grade I diastolic  dysfunction (impaired relaxation).  2. Right ventricular systolic function is normal. The right  ventricular size is normal. Tricuspid regurgitation signal is inadequate for assessing PA pressure.  3. The mitral valve is normal in structure. Mild mitral valve regurgitation. No evidence of mitral stenosis.  4. The aortic valve is tricuspid. There is mild calcification of the aortic valve. Aortic valve  regurgitation is moderate. No aortic stenosis is present.  5. The inferior vena cava is normal in size with greater than 50% respiratory variability, suggesting right atrial pressure of 3 mmHg.  6. A small pericardial effusion is present. The pericardial effusion is circumferential.   R/L cardiac cath 04/29/19:  Prox RCA to Mid RCA lesion is 30% stenosed. Ost LM to Mid LM lesion is 50% stenosed. Prox LAD lesion is 20% stenosed. 2nd Diag lesion is 75% stenosed. RPDA lesion is 75% stenosed. The left ventricular systolic function is normal. LV end diastolic pressure is mildly elevated. Mid Cx lesion is 15% stenosed.   Past Medical History:  Diagnosis Date   Arthritis    Back pain    COPD (chronic obstructive pulmonary disease)    Coronary artery disease    DDD (degenerative disc disease), lumbar    Depression    Emphysema of lung    Empyema    2016  (was in [place])   GERD (gastroesophageal reflux disease)    Hearing loss of right ear    started 2001.     microphone in right ear ,  and hearing aid in left   Hodgkin's disease 1999   Hypotension    Meniere's disease    PAD (peripheral artery disease)    Pneumonia    Tinnitus     Past Surgical History:  Procedure Laterality Date   ABDOMINAL AORTOGRAM W/LOWER EXTREMITY Bilateral 02/04/2023   Procedure: ABDOMINAL AORTOGRAM W/LOWER EXTREMITY;  Surgeon: Iran Ouch, MD;  [facility];  Service: Cardiovascular;  Laterality: Bilateral;   BACK SURGERY     BIOPSY  01/15/2023   Procedure: BIOPSY;  Surgeon: Imogene Burn, MD;  Location: [facility];  Service: Gastroenterology;;   CERVICAL FUSION  2016   COLONOSCOPY WITH PROPOFOL N/A 01/15/2023   Procedure: COLONOSCOPY WITH PROPOFOL;  Surgeon: Imogene Burn, MD;  Location: [facility];  Service: Gastroenterology;  Laterality: N/A;   ELBOW SURGERY     ESOPHAGOGASTRODUODENOSCOPY (EGD) WITH PROPOFOL N/A 01/15/2023   Procedure: ESOPHAGOGASTRODUODENOSCOPY (EGD) WITH  PROPOFOL;  Surgeon: Imogene Burn, MD;  Location: [facility];  Service: Gastroenterology;  Laterality: N/A;   EYE SURGERY     IR THORACENTESIS ASP PLEURAL SPACE W/IMG GUIDE  10/08/2018   LAMINECTOMY  1999   lower back surgery     PERIPHERAL VASCULAR INTERVENTION Left 02/04/2023   Procedure: PERIPHERAL VASCULAR INTERVENTION;  Surgeon: Iran Ouch, MD;  [facility];  Service: Cardiovascular;  Laterality: Left;  left external iliac   POLYPECTOMY  01/15/2023   Procedure: POLYPECTOMY;  Surgeon: Imogene Burn, MD;  Location: [facility];  Service: Gastroenterology;;   RIGHT/LEFT HEART CATH AND CORONARY ANGIOGRAPHY N/A 04/29/2019   Procedure: RIGHT/LEFT HEART CATH AND CORONARY ANGIOGRAPHY;  Surgeon: Lennette Bihari, MD;  [facility];  Service: Cardiovascular;  Laterality: N/A;   TONSILLECTOMY      MEDICATIONS: No current facility-administered medications for this encounter.    albuterol (PROVENTIL) (2.5 MG/3ML) 0.083% nebulizer solution   albuterol (VENTOLIN HFA) 108 (90 Base) MCG/ACT inhaler   aspirin EC 81 MG tablet   Budeson-Glycopyrrol-Formoterol (BREZTRI AEROSPHERE) 160-9-4.8 MCG/ACT AERO   clopidogrel (  PLAVIX) 75 MG tablet   isosorbide mononitrate (IMDUR) 30 MG 24 hr tablet   metoprolol succinate (TOPROL-XL) 25 MG 24 hr tablet   pantoprazole (PROTONIX) 40 MG tablet   rosuvastatin (CRESTOR) 40 MG tablet   levofloxacin (LEVAQUIN) 750 MG tablet    If labs acceptable day of surgery, I anticipate pt can proceed with surgery as scheduled.  Rica MastAngela Jeanifer Halliday, PhD, FNP-BC [facility]Anesthesiology Phone: [phone] 02/09/2023 11:59 AM

## 2023-02-09 NOTE — Telephone Encounter (Addendum)
Received notification from Karna Christmas., NP with preadmission anesthesia dept advising that it is not possible to do spinal or epidural for upcoming femoral endarterectomy surgery without him holding Plavix 5-7 days prior.   Spoke with Dr. [name], who advised if anesthesia feels it is unsafe, we will use general anesthesia. Information relayed to [place]. OR case updated.

## 2023-02-10 ENCOUNTER — Other Ambulatory Visit: Payer: Self-pay

## 2023-02-10 ENCOUNTER — Encounter (HOSPITAL_COMMUNITY): Payer: Self-pay | Admitting: Vascular Surgery

## 2023-02-10 NOTE — Progress Notes (Signed)
Spoke with pt for pre-op call. HX of CAD (mild to mod on 2020 cath), COPD and uses O2 prn for shortness of breath. Pt states he is not diabetic.   See Marylene Land Kabbe's note.   Shower instructions given to pt and he voiced understanding.

## 2023-02-10 NOTE — Anesthesia Preprocedure Evaluation (Signed)
Anesthesia Evaluation  Patient identified by MRN, date of birth, ID band Patient awake    Reviewed: Allergy & Precautions, H&P , NPO status , Patient's Chart, lab work & pertinent test results, reviewed documented beta blocker date and time   Airway Mallampati: II  TM Distance: >3 FB Neck ROM: Full    Dental no notable dental hx. (+) Dental Advisory Given, Edentulous Upper, Edentulous Lower   Pulmonary shortness of breath, COPD,  COPD inhaler and oxygen dependent, former smoker   Pulmonary exam normal breath sounds clear to auscultation       Cardiovascular hypertension, Pt. on medications and Pt. on home beta blockers + CAD and + Peripheral Vascular Disease   Rhythm:Regular Rate:Normal     Neuro/Psych    Depression    negative neurological ROS     GI/Hepatic Neg liver ROS,GERD  Medicated,,  Endo/Other  negative endocrine ROS    Renal/GU negative Renal ROS  negative genitourinary   Musculoskeletal  (+) Arthritis , Osteoarthritis,    Abdominal   Peds  Hematology negative hematology ROS (+)   Anesthesia Other Findings   Reproductive/Obstetrics negative OB ROS                             Anesthesia Physical Anesthesia Plan  ASA: 3  Anesthesia Plan: General   Post-op Pain Management: Tylenol PO (pre-op)*   Induction: Intravenous  PONV Risk Score and Plan: 3 and Ondansetron, Dexamethasone and Midazolam  Airway Management Planned: Oral ETT  Additional Equipment: Arterial line  Intra-op Plan:   Post-operative Plan: Extubation in OR  Informed Consent: I have reviewed the patients History and Physical, chart, labs and discussed the procedure including the risks, benefits and alternatives for the proposed anesthesia with the patient or authorized representative who has indicated his/her understanding and acceptance.     Dental advisory given  Plan Discussed with:  CRNA  Anesthesia Plan Comments: (See APP note by Joslyn Hy, FNP. Pt will get labs and CXR day of surgery. )       Anesthesia Quick Evaluation

## 2023-02-11 ENCOUNTER — Other Ambulatory Visit: Payer: Self-pay

## 2023-02-11 ENCOUNTER — Encounter (HOSPITAL_COMMUNITY): Payer: Self-pay | Admitting: Vascular Surgery

## 2023-02-11 ENCOUNTER — Inpatient Hospital Stay (HOSPITAL_COMMUNITY)
Admission: RE | Admit: 2023-02-11 | Discharge: 2023-02-12 | DRG: 253 | Disposition: A | Discharge status: Home Health | Payer: Medicare Other | Attending: Vascular Surgery | Admitting: Vascular Surgery

## 2023-02-11 ENCOUNTER — Inpatient Hospital Stay (HOSPITAL_COMMUNITY): Payer: Medicare Other

## 2023-02-11 ENCOUNTER — Inpatient Hospital Stay (HOSPITAL_COMMUNITY): Payer: Medicare Other | Admitting: Emergency Medicine

## 2023-02-11 ENCOUNTER — Encounter (HOSPITAL_COMMUNITY)
Admission: RE | Disposition: A | Discharge status: Home Health | Payer: Self-pay | Source: Home / Self Care | Attending: Vascular Surgery

## 2023-02-11 DIAGNOSIS — Z981 Arthrodesis status: Secondary | ICD-10-CM

## 2023-02-11 DIAGNOSIS — Z7982 Long term (current) use of aspirin: Secondary | ICD-10-CM

## 2023-02-11 DIAGNOSIS — R9389 Abnormal findings on diagnostic imaging of other specified body structures: Principal | ICD-10-CM

## 2023-02-11 DIAGNOSIS — Z7951 Long term (current) use of inhaled steroids: Secondary | ICD-10-CM

## 2023-02-11 DIAGNOSIS — E785 Hyperlipidemia, unspecified: Secondary | ICD-10-CM | POA: Diagnosis present

## 2023-02-11 DIAGNOSIS — H9191 Unspecified hearing loss, right ear: Secondary | ICD-10-CM | POA: Diagnosis present

## 2023-02-11 DIAGNOSIS — I10 Essential (primary) hypertension: Secondary | ICD-10-CM

## 2023-02-11 DIAGNOSIS — I708 Atherosclerosis of other arteries: Secondary | ICD-10-CM | POA: Diagnosis present

## 2023-02-11 DIAGNOSIS — Z833 Family history of diabetes mellitus: Secondary | ICD-10-CM

## 2023-02-11 DIAGNOSIS — Z808 Family history of malignant neoplasm of other organs or systems: Secondary | ICD-10-CM | POA: Diagnosis not present

## 2023-02-11 DIAGNOSIS — I5032 Chronic diastolic (congestive) heart failure: Secondary | ICD-10-CM | POA: Diagnosis present

## 2023-02-11 DIAGNOSIS — Z79899 Other long term (current) drug therapy: Secondary | ICD-10-CM | POA: Diagnosis not present

## 2023-02-11 DIAGNOSIS — Z87891 Personal history of nicotine dependence: Secondary | ICD-10-CM | POA: Diagnosis not present

## 2023-02-11 DIAGNOSIS — Z9981 Dependence on supplemental oxygen: Secondary | ICD-10-CM

## 2023-02-11 DIAGNOSIS — J90 Pleural effusion, not elsewhere classified: Secondary | ICD-10-CM | POA: Diagnosis not present

## 2023-02-11 DIAGNOSIS — I739 Peripheral vascular disease, unspecified: Secondary | ICD-10-CM | POA: Diagnosis not present

## 2023-02-11 DIAGNOSIS — I70221 Atherosclerosis of native arteries of extremities with rest pain, right leg: Principal | ICD-10-CM | POA: Diagnosis present

## 2023-02-11 DIAGNOSIS — I110 Hypertensive heart disease with heart failure: Secondary | ICD-10-CM | POA: Diagnosis present

## 2023-02-11 DIAGNOSIS — J449 Chronic obstructive pulmonary disease, unspecified: Secondary | ICD-10-CM | POA: Diagnosis not present

## 2023-02-11 DIAGNOSIS — I998 Other disorder of circulatory system: Secondary | ICD-10-CM

## 2023-02-11 DIAGNOSIS — I70211 Atherosclerosis of native arteries of extremities with intermittent claudication, right leg: Secondary | ICD-10-CM

## 2023-02-11 DIAGNOSIS — J439 Emphysema, unspecified: Secondary | ICD-10-CM | POA: Diagnosis present

## 2023-02-11 DIAGNOSIS — Z8249 Family history of ischemic heart disease and other diseases of the circulatory system: Secondary | ICD-10-CM

## 2023-02-11 DIAGNOSIS — Z8043 Family history of malignant neoplasm of testis: Secondary | ICD-10-CM | POA: Diagnosis not present

## 2023-02-11 DIAGNOSIS — Z8616 Personal history of COVID-19: Secondary | ICD-10-CM

## 2023-02-11 DIAGNOSIS — Z8571 Personal history of Hodgkin lymphoma: Secondary | ICD-10-CM

## 2023-02-11 DIAGNOSIS — I2510 Atherosclerotic heart disease of native coronary artery without angina pectoris: Secondary | ICD-10-CM

## 2023-02-11 DIAGNOSIS — K219 Gastro-esophageal reflux disease without esophagitis: Secondary | ICD-10-CM | POA: Diagnosis present

## 2023-02-11 HISTORY — PX: ENDARTERECTOMY FEMORAL: SHX5804

## 2023-02-11 HISTORY — DX: Dyspnea, unspecified: R06.00

## 2023-02-11 HISTORY — DX: Atherosclerotic heart disease of native coronary artery without angina pectoris: I25.10

## 2023-02-11 HISTORY — DX: COVID-19: U07.1

## 2023-02-11 HISTORY — DX: Peripheral vascular disease, unspecified: I73.9

## 2023-02-11 LAB — URINALYSIS, ROUTINE W REFLEX MICROSCOPIC
Bilirubin Urine: NEGATIVE
Glucose, UA: NEGATIVE mg/dL
Hgb urine dipstick: NEGATIVE
Ketones, ur: NEGATIVE mg/dL
Leukocytes,Ua: NEGATIVE
Nitrite: NEGATIVE
Protein, ur: NEGATIVE mg/dL
Specific Gravity, Urine: 1.015 (ref 1.005–1.030)
pH: 6.5 (ref 5.0–8.0)

## 2023-02-11 LAB — COMPREHENSIVE METABOLIC PANEL
ALT: 21 U/L (ref 0–44)
AST: 32 U/L (ref 15–41)
Albumin: 3.3 g/dL — ABNORMAL LOW (ref 3.5–5.0)
Alkaline Phosphatase: 56 U/L (ref 38–126)
Anion gap: 9 (ref 5–15)
BUN: 8 mg/dL (ref 8–23)
CO2: 28 mmol/L (ref 22–32)
Calcium: 8.4 mg/dL — ABNORMAL LOW (ref 8.9–10.3)
Chloride: 103 mmol/L (ref 98–111)
Creatinine, Ser: 1.19 mg/dL (ref 0.61–1.24)
GFR, Estimated: >60 mL/min (ref >60)
Glucose, Bld: 97 mg/dL (ref 70–99)
Potassium: 3.1 mmol/L — ABNORMAL LOW (ref 3.5–5.1)
Sodium: 140 mmol/L (ref 135–145)
Total Bilirubin: 1.2 mg/dL (ref 0.3–1.2)
Total Protein: 6.9 g/dL (ref 6.5–8.1)

## 2023-02-11 LAB — SURGICAL PCR SCREEN
MRSA, PCR: NEGATIVE
Staphylococcus aureus: NEGATIVE

## 2023-02-11 LAB — CBC
HCT: 31.4 % — ABNORMAL LOW (ref 39.0–52.0)
Hemoglobin: 10.5 g/dL — ABNORMAL LOW (ref 13.0–17.0)
MCH: 32.3 pg (ref 26.0–34.0)
MCHC: 33.4 g/dL (ref 30.0–36.0)
MCV: 96.6 fL (ref 80.0–100.0)
Platelets: 116 10*3/uL — ABNORMAL LOW (ref 150–400)
RBC: 3.25 MIL/uL — ABNORMAL LOW (ref 4.22–5.81)
RDW: 13.2 % (ref 11.5–15.5)
WBC: 4.3 10*3/uL (ref 4.0–10.5)
nRBC: 0 % (ref 0.0–0.2)

## 2023-02-11 LAB — APTT: aPTT: 31 seconds (ref 24–36)

## 2023-02-11 LAB — POCT I-STAT 7, (LYTES, BLD GAS, ICA,H+H)
Acid-base deficit: 2 mmol/L (ref 0.0–2.0)
Bicarbonate: 22.8 mmol/L (ref 20.0–28.0)
Calcium, Ion: 1.07 mmol/L — ABNORMAL LOW (ref 1.15–1.40)
HCT: 21 % — ABNORMAL LOW (ref 39.0–52.0)
Hemoglobin: 7.1 g/dL — ABNORMAL LOW (ref 13.0–17.0)
O2 Saturation: 100 %
Patient temperature: 36.2
Potassium: 2.9 mmol/L — ABNORMAL LOW (ref 3.5–5.1)
Sodium: 142 mmol/L (ref 135–145)
TCO2: 24 mmol/L (ref 22–32)
pCO2 arterial: 36.3 mmHg (ref 32–48)
pH, Arterial: 7.403 (ref 7.35–7.45)
pO2, Arterial: 223 mmHg — ABNORMAL HIGH (ref 83–108)

## 2023-02-11 LAB — PROTIME-INR
INR: 1 (ref 0.8–1.2)
Prothrombin Time: 13.5 seconds (ref 11.4–15.2)

## 2023-02-11 LAB — POCT ACTIVATED CLOTTING TIME
Activated Clotting Time: 260 seconds
Activated Clotting Time: 228 seconds
Activated Clotting Time: 233 seconds

## 2023-02-11 LAB — TYPE AND SCREEN
ABO/RH(D): O POS
Antibody Screen: NEGATIVE

## 2023-02-11 SURGERY — ENDARTERECTOMY, FEMORAL
Anesthesia: General | Site: Leg Upper | Laterality: Right

## 2023-02-11 MED ORDER — 0.9 % SODIUM CHLORIDE (POUR BTL) OPTIME
TOPICAL | Status: DC | PRN
  Administered 2023-02-11: 2000 mL

## 2023-02-11 MED ORDER — HYDROMORPHONE HCL 1 MG/ML IJ SOLN
0.2500 mg | INTRAMUSCULAR | Status: DC | PRN
  Administered 2023-02-11 (×2): 0.5 mg via INTRAVENOUS

## 2023-02-11 MED ORDER — SENNOSIDES-DOCUSATE SODIUM 8.6-50 MG PO TABS: 1.0000 | ORAL_TABLET | Freq: Every evening | ORAL | Status: DC | PRN

## 2023-02-11 MED ORDER — LIDOCAINE 2% (20 MG/ML) 5 ML SYRINGE
INTRAMUSCULAR | Status: DC | PRN
  Administered 2023-02-11: 60 mg via INTRAVENOUS

## 2023-02-11 MED ORDER — ROCURONIUM BROMIDE 10 MG/ML (PF) SYRINGE
PREFILLED_SYRINGE | INTRAVENOUS | Status: AC
  Filled 2023-02-11: qty 10

## 2023-02-11 MED ORDER — HEPARIN 6000 UNIT IRRIGATION SOLUTION
Status: DC | PRN
  Administered 2023-02-11: 1

## 2023-02-11 MED ORDER — BISACODYL 5 MG PO TBEC: 5.0000 mg | DELAYED_RELEASE_TABLET | Freq: Every day | ORAL | Status: DC | PRN

## 2023-02-11 MED ORDER — HYDRALAZINE HCL 20 MG/ML IJ SOLN: 5.0000 mg | INTRAMUSCULAR | Status: DC | PRN

## 2023-02-11 MED ORDER — ISOSORBIDE MONONITRATE ER 30 MG PO TB24
30.0000 mg | ORAL_TABLET | Freq: Every morning | ORAL | Status: DC
  Administered 2023-02-12: 30 mg via ORAL
  Filled 2023-02-11: qty 1

## 2023-02-11 MED ORDER — PHENOL 1.4 % MT LIQD: 1.0000 | OROMUCOSAL | Status: DC | PRN

## 2023-02-11 MED ORDER — ACETAMINOPHEN 10 MG/ML IV SOLN
INTRAVENOUS | Status: AC
  Filled 2023-02-11: qty 100

## 2023-02-11 MED ORDER — METOPROLOL SUCCINATE ER 25 MG PO TB24
25.0000 mg | ORAL_TABLET | Freq: Every day | ORAL | Status: DC
  Administered 2023-02-12: 25 mg via ORAL
  Filled 2023-02-11: qty 1

## 2023-02-11 MED ORDER — SODIUM CHLORIDE 0.9 % IV SOLN: 500.0000 mL | Freq: Once | INTRAVENOUS | Status: DC | PRN

## 2023-02-11 MED ORDER — HEPARIN SODIUM (PORCINE) 1000 UNIT/ML IJ SOLN
INTRAMUSCULAR | Status: DC | PRN
  Administered 2023-02-11: 2000 [IU] via INTRAVENOUS
  Administered 2023-02-11: 8000 [IU] via INTRAVENOUS

## 2023-02-11 MED ORDER — MIDAZOLAM HCL 2 MG/2ML IJ SOLN
INTRAMUSCULAR | Status: AC
  Filled 2023-02-11: qty 2

## 2023-02-11 MED ORDER — ESMOLOL HCL 100 MG/10ML IV SOLN
INTRAVENOUS | Status: AC
  Filled 2023-02-11: qty 10

## 2023-02-11 MED ORDER — CHLORHEXIDINE GLUCONATE 0.12 % MT SOLN
15.0000 mL | Freq: Once | OROMUCOSAL | Status: AC
  Administered 2023-02-11: 15 mL via OROMUCOSAL
  Filled 2023-02-11: qty 15

## 2023-02-11 MED ORDER — HEMOSTATIC AGENTS (NO CHARGE) OPTIME
TOPICAL | Status: DC | PRN
  Administered 2023-02-11: 1 via TOPICAL

## 2023-02-11 MED ORDER — ESMOLOL HCL 100 MG/10ML IV SOLN
INTRAVENOUS | Status: DC | PRN
  Administered 2023-02-11: 10 mg via INTRAVENOUS

## 2023-02-11 MED ORDER — PANTOPRAZOLE SODIUM 40 MG PO TBEC
40.0000 mg | DELAYED_RELEASE_TABLET | Freq: Every day | ORAL | Status: DC
  Administered 2023-02-12: 40 mg via ORAL
  Filled 2023-02-11: qty 1

## 2023-02-11 MED ORDER — DOCUSATE SODIUM 100 MG PO CAPS: 100.0000 mg | ORAL_CAPSULE | Freq: Every day | ORAL | Status: DC

## 2023-02-11 MED ORDER — OXYCODONE-ACETAMINOPHEN 5-325 MG PO TABS
1.0000 | ORAL_TABLET | ORAL | Status: DC | PRN
  Administered 2023-02-12: 2 via ORAL
  Filled 2023-02-11: qty 2

## 2023-02-11 MED ORDER — ACETAMINOPHEN 325 MG PO TABS: 325.0000 mg | ORAL_TABLET | ORAL | Status: DC | PRN

## 2023-02-11 MED ORDER — ONDANSETRON HCL 4 MG/2ML IJ SOLN: 4.0000 mg | Freq: Four times a day (QID) | INTRAMUSCULAR | Status: DC | PRN

## 2023-02-11 MED ORDER — ONDANSETRON HCL 4 MG/2ML IJ SOLN
INTRAMUSCULAR | Status: DC | PRN
  Administered 2023-02-11: 4 mg via INTRAVENOUS

## 2023-02-11 MED ORDER — CHLORHEXIDINE GLUCONATE CLOTH 2 % EX PADS: 6.0000 | MEDICATED_PAD | Freq: Once | CUTANEOUS | Status: DC

## 2023-02-11 MED ORDER — HEPARIN 6000 UNIT IRRIGATION SOLUTION
Status: AC
  Filled 2023-02-11: qty 500

## 2023-02-11 MED ORDER — ALBUTEROL SULFATE HFA 108 (90 BASE) MCG/ACT IN AERS
INHALATION_SPRAY | RESPIRATORY_TRACT | Status: DC | PRN
  Administered 2023-02-11 (×2): 4 via RESPIRATORY_TRACT

## 2023-02-11 MED ORDER — MIDAZOLAM HCL 2 MG/2ML IJ SOLN
INTRAMUSCULAR | Status: DC | PRN
  Administered 2023-02-11: 1 mg via INTRAVENOUS

## 2023-02-11 MED ORDER — DEXAMETHASONE SODIUM PHOSPHATE 10 MG/ML IJ SOLN
INTRAMUSCULAR | Status: DC | PRN
  Administered 2023-02-11: 5 mg via INTRAVENOUS

## 2023-02-11 MED ORDER — FENTANYL CITRATE (PF) 250 MCG/5ML IJ SOLN
INTRAMUSCULAR | Status: AC
  Filled 2023-02-11: qty 5

## 2023-02-11 MED ORDER — ALUM & MAG HYDROXIDE-SIMETH 200-200-20 MG/5ML PO SUSP: 15.0000 mL | ORAL | Status: DC | PRN

## 2023-02-11 MED ORDER — SUGAMMADEX SODIUM 200 MG/2ML IV SOLN
INTRAVENOUS | Status: DC | PRN
  Administered 2023-02-11: 200 mg via INTRAVENOUS

## 2023-02-11 MED ORDER — UMECLIDINIUM BROMIDE 62.5 MCG/ACT IN AEPB
1.0000 | INHALATION_SPRAY | Freq: Every day | RESPIRATORY_TRACT | Status: DC
  Filled 2023-02-11: qty 7

## 2023-02-11 MED ORDER — FENTANYL CITRATE (PF) 250 MCG/5ML IJ SOLN
INTRAMUSCULAR | Status: DC | PRN
  Administered 2023-02-11: 100 ug via INTRAVENOUS
  Administered 2023-02-11 (×2): 50 ug via INTRAVENOUS

## 2023-02-11 MED ORDER — PHENYLEPHRINE HCL-NACL 20-0.9 MG/250ML-% IV SOLN
INTRAVENOUS | Status: DC | PRN
  Administered 2023-02-11: 25 ug/min via INTRAVENOUS

## 2023-02-11 MED ORDER — MAGNESIUM SULFATE 2 GM/50ML IV SOLN: 2.0000 g | Freq: Every day | INTRAVENOUS | Status: DC | PRN

## 2023-02-11 MED ORDER — POTASSIUM CHLORIDE CRYS ER 20 MEQ PO TBCR
20.0000 meq | EXTENDED_RELEASE_TABLET | Freq: Every day | ORAL | Status: AC | PRN
  Administered 2023-02-11: 40 meq via ORAL
  Filled 2023-02-11: qty 2

## 2023-02-11 MED ORDER — HYDROMORPHONE HCL 1 MG/ML IJ SOLN: 0.5000 mg | INTRAMUSCULAR | Status: DC | PRN

## 2023-02-11 MED ORDER — ONDANSETRON HCL 4 MG/2ML IJ SOLN
INTRAMUSCULAR | Status: AC
  Filled 2023-02-11: qty 2

## 2023-02-11 MED ORDER — METOPROLOL TARTRATE 5 MG/5ML IV SOLN: 2.0000 mg | INTRAVENOUS | Status: DC | PRN

## 2023-02-11 MED ORDER — PROPOFOL 10 MG/ML IV BOLUS
INTRAVENOUS | Status: AC
  Filled 2023-02-11: qty 20

## 2023-02-11 MED ORDER — ROCURONIUM BROMIDE 10 MG/ML (PF) SYRINGE
PREFILLED_SYRINGE | INTRAVENOUS | Status: DC | PRN
  Administered 2023-02-11: 60 mg via INTRAVENOUS

## 2023-02-11 MED ORDER — ACETAMINOPHEN 650 MG RE SUPP: 325.0000 mg | RECTAL | Status: DC | PRN

## 2023-02-11 MED ORDER — ALBUTEROL SULFATE HFA 108 (90 BASE) MCG/ACT IN AERS: 2.0000 | INHALATION_SPRAY | Freq: Four times a day (QID) | RESPIRATORY_TRACT | Status: DC | PRN

## 2023-02-11 MED ORDER — UMECLIDINIUM BROMIDE 62.5 MCG/ACT IN AEPB
1.0000 | INHALATION_SPRAY | Freq: Every day | RESPIRATORY_TRACT | Status: DC
  Administered 2023-02-12: 1 via RESPIRATORY_TRACT
  Filled 2023-02-11: qty 7

## 2023-02-11 MED ORDER — HEPARIN SODIUM (PORCINE) 5000 UNIT/ML IJ SOLN
5000.0000 [IU] | Freq: Three times a day (TID) | INTRAMUSCULAR | Status: DC
  Administered 2023-02-12: 5000 [IU] via SUBCUTANEOUS
  Filled 2023-02-11: qty 1

## 2023-02-11 MED ORDER — ACETAMINOPHEN 500 MG PO TABS: 1000.0000 mg | ORAL_TABLET | Freq: Once | ORAL | Status: DC

## 2023-02-11 MED ORDER — PANTOPRAZOLE SODIUM 40 MG PO TBEC
40.0000 mg | DELAYED_RELEASE_TABLET | Freq: Every day | ORAL | Status: DC
Start: 2023-02-11 — End: 2023-02-11

## 2023-02-11 MED ORDER — CEFAZOLIN SODIUM-DEXTROSE 2-4 GM/100ML-% IV SOLN
2.0000 g | INTRAVENOUS | Status: DC
  Filled 2023-02-11: qty 100

## 2023-02-11 MED ORDER — ORAL CARE MOUTH RINSE: 15.0000 mL | Freq: Once | OROMUCOSAL | Status: AC

## 2023-02-11 MED ORDER — PROPOFOL 10 MG/ML IV BOLUS
INTRAVENOUS | Status: DC | PRN
  Administered 2023-02-11: 130 mg via INTRAVENOUS

## 2023-02-11 MED ORDER — ALBUTEROL SULFATE (2.5 MG/3ML) 0.083% IN NEBU: 2.5000 mg | INHALATION_SOLUTION | RESPIRATORY_TRACT | Status: DC | PRN

## 2023-02-11 MED ORDER — LACTATED RINGERS IV SOLN: INTRAVENOUS | Status: DC

## 2023-02-11 MED ORDER — LEVOFLOXACIN 750 MG PO TABS
750.0000 mg | ORAL_TABLET | Freq: Every day | ORAL | Status: DC
  Administered 2023-02-11 – 2023-02-12 (×2): 750 mg via ORAL
  Filled 2023-02-11 (×2): qty 1

## 2023-02-11 MED ORDER — CEFAZOLIN SODIUM-DEXTROSE 2-4 GM/100ML-% IV SOLN
2.0000 g | Freq: Three times a day (TID) | INTRAVENOUS | Status: AC
  Administered 2023-02-11 – 2023-02-12 (×2): 2 g via INTRAVENOUS
  Filled 2023-02-11 (×2): qty 100

## 2023-02-11 MED ORDER — DEXAMETHASONE SODIUM PHOSPHATE 10 MG/ML IJ SOLN
INTRAMUSCULAR | Status: AC
  Filled 2023-02-11: qty 1

## 2023-02-11 MED ORDER — LABETALOL HCL 5 MG/ML IV SOLN: 10.0000 mg | INTRAVENOUS | Status: DC | PRN

## 2023-02-11 MED ORDER — BUDESON-GLYCOPYRROL-FORMOTEROL 160-9-4.8 MCG/ACT IN AERO: 2.0000 | INHALATION_SPRAY | Freq: Two times a day (BID) | RESPIRATORY_TRACT | Status: DC

## 2023-02-11 MED ORDER — CLOPIDOGREL BISULFATE 75 MG PO TABS
75.0000 mg | ORAL_TABLET | Freq: Every day | ORAL | Status: DC
  Administered 2023-02-12: 75 mg via ORAL
  Filled 2023-02-11: qty 1

## 2023-02-11 MED ORDER — ROSUVASTATIN CALCIUM 20 MG PO TABS
40.0000 mg | ORAL_TABLET | Freq: Every day | ORAL | Status: DC
  Administered 2023-02-12: 40 mg via ORAL
  Filled 2023-02-11: qty 2

## 2023-02-11 MED ORDER — GUAIFENESIN-DM 100-10 MG/5ML PO SYRP: 15.0000 mL | ORAL_SOLUTION | ORAL | Status: DC | PRN

## 2023-02-11 MED ORDER — MOMETASONE FURO-FORMOTEROL FUM 100-5 MCG/ACT IN AERO
2.0000 | INHALATION_SPRAY | Freq: Two times a day (BID) | RESPIRATORY_TRACT | Status: DC
  Filled 2023-02-11: qty 8.8

## 2023-02-11 MED ORDER — SODIUM CHLORIDE 0.9 % IV SOLN: INTRAVENOUS | Status: DC

## 2023-02-11 MED ORDER — LACTATED RINGERS IV SOLN: INTRAVENOUS | Status: DC | PRN

## 2023-02-11 MED ORDER — ORAL CARE MOUTH RINSE: 15.0000 mL | OROMUCOSAL | Status: DC | PRN

## 2023-02-11 MED ORDER — ASPIRIN 81 MG PO TBEC
81.0000 mg | DELAYED_RELEASE_TABLET | Freq: Every day | ORAL | Status: DC
  Administered 2023-02-12: 81 mg via ORAL
  Filled 2023-02-11: qty 1

## 2023-02-11 MED ORDER — PROTAMINE SULFATE 10 MG/ML IV SOLN
INTRAVENOUS | Status: DC | PRN
  Administered 2023-02-11: 20 mg via INTRAVENOUS
  Administered 2023-02-11: 30 mg via INTRAVENOUS

## 2023-02-11 MED ORDER — ACETAMINOPHEN 10 MG/ML IV SOLN
INTRAVENOUS | Status: DC | PRN
  Administered 2023-02-11: 1000 mg via INTRAVENOUS

## 2023-02-11 MED ORDER — HYDROMORPHONE HCL 1 MG/ML IJ SOLN
INTRAMUSCULAR | Status: AC
  Filled 2023-02-11: qty 1

## 2023-02-11 MED ORDER — MOMETASONE FURO-FORMOTEROL FUM 100-5 MCG/ACT IN AERO
2.0000 | INHALATION_SPRAY | Freq: Two times a day (BID) | RESPIRATORY_TRACT | Status: DC
  Filled 2023-02-11 (×2): qty 8.8

## 2023-02-11 MED ORDER — PHENYLEPHRINE 80 MCG/ML (10ML) SYRINGE FOR IV PUSH (FOR BLOOD PRESSURE SUPPORT)
PREFILLED_SYRINGE | INTRAVENOUS | Status: DC | PRN
  Administered 2023-02-11: 80 ug via INTRAVENOUS
  Administered 2023-02-11: 40 ug via INTRAVENOUS

## 2023-02-11 SURGICAL SUPPLY — 46 items
APL PRP STRL LF DISP 70% ISPRP (MISCELLANEOUS) ×1
APL SKNCLS STERI-STRIP NONHPOA (GAUZE/BANDAGES/DRESSINGS) ×1
BAG COUNTER SPONGE SURGICOUNT (BAG) ×1 IMPLANT
BAG SPNG CNTER NS LX DISP (BAG) ×1
BENZOIN TINCTURE PRP APPL 2/3 (GAUZE/BANDAGES/DRESSINGS) ×1 IMPLANT
CANISTER SUCT 3000ML PPV (MISCELLANEOUS) ×1 IMPLANT
CANNULA VESSEL 3MM 2 BLNT TIP (CANNULA) ×2 IMPLANT
CATH EMB 4FR 40 (CATHETERS) IMPLANT
CHLORAPREP W/TINT 26 (MISCELLANEOUS) ×1 IMPLANT
CLIP LIGATING EXTRA MED SLVR (CLIP) IMPLANT
CLIP LIGATING EXTRA SM BLUE (MISCELLANEOUS) IMPLANT
COVER PROBE W GEL 5X96 (DRAPES) IMPLANT
DRAIN CHANNEL 15F RND FF W/TCR (WOUND CARE) IMPLANT
ELECT REM PT RETURN 9FT ADLT (ELECTROSURGICAL) ×1
ELECTRODE REM PT RTRN 9FT ADLT (ELECTROSURGICAL) ×1 IMPLANT
EVACUATOR SILICONE 100CC (DRAIN) IMPLANT
GAUZE SPONGE 4X4 12PLY STRL (GAUZE/BANDAGES/DRESSINGS) ×1 IMPLANT
GLOVE BIO SURGEON STRL SZ8 (GLOVE) ×1 IMPLANT
GOWN STRL REUS W/ TWL LRG LVL3 (GOWN DISPOSABLE) ×2 IMPLANT
GOWN STRL REUS W/ TWL XL LVL3 (GOWN DISPOSABLE) ×1 IMPLANT
GOWN STRL REUS W/TWL LRG LVL3 (GOWN DISPOSABLE) ×2
GOWN STRL REUS W/TWL XL LVL3 (GOWN DISPOSABLE) ×1
HEMOSTAT SNOW SURGICEL 2X4 (HEMOSTASIS) IMPLANT
KIT BASIN OR (CUSTOM PROCEDURE TRAY) ×1 IMPLANT
KIT TURNOVER KIT B (KITS) ×1 IMPLANT
NS IRRIG 1000ML POUR BTL (IV SOLUTION) ×2 IMPLANT
PACK PERIPHERAL VASCULAR (CUSTOM PROCEDURE TRAY) ×1 IMPLANT
PAD ARMBOARD 7.5X6 YLW CONV (MISCELLANEOUS) ×2 IMPLANT
PATCH VASC XENOSURE 1CMX6CM (Vascular Products) ×1 IMPLANT
PATCH VASC XENOSURE 1X6 (Vascular Products) IMPLANT
SET WALTER ACTIVATION W/DRAPE (SET/KITS/TRAYS/PACK) ×1 IMPLANT
STOPCOCK 4 WAY LG BORE MALE ST (IV SETS) IMPLANT
STRIP CLOSURE SKIN 1/2X4 (GAUZE/BANDAGES/DRESSINGS) ×1 IMPLANT
SUT ETHILON 3 0 PS 1 (SUTURE) IMPLANT
SUT MNCRL AB 4-0 PS2 18 (SUTURE) ×1 IMPLANT
SUT PROLENE 5 0 C 1 24 (SUTURE) ×1 IMPLANT
SUT PROLENE 6 0 BV (SUTURE) ×1 IMPLANT
SUT VIC AB 2-0 CT1 27 (SUTURE) ×1
SUT VIC AB 2-0 CT1 TAPERPNT 27 (SUTURE) ×1 IMPLANT
SUT VIC AB 3-0 SH 27 (SUTURE) ×1
SUT VIC AB 3-0 SH 27X BRD (SUTURE) ×1 IMPLANT
SYR 3ML LL SCALE MARK (SYRINGE) IMPLANT
TOWEL GREEN STERILE (TOWEL DISPOSABLE) ×2 IMPLANT
TOWEL GREEN STERILE FF (TOWEL DISPOSABLE) ×1 IMPLANT
UNDERPAD 30X36 HEAVY ABSORB (UNDERPADS AND DIAPERS) ×1 IMPLANT
WATER STERILE IRR 1000ML POUR (IV SOLUTION) ×1 IMPLANT

## 2023-02-11 NOTE — Interval H&P Note (Signed)
History and Physical Interval Note:  02/11/2023 11:52 AM  Chase Lloyd  has presented today for surgery, with the diagnosis of Atherosclerosis of right lower extremity with rest pain.  The various methods of treatment have been discussed with the patient and family. After consideration of risks, benefits and other options for treatment, the patient has consented to  Procedure(s): ENDARTERECTOMY FEMORAL (Right) as a surgical intervention.  The patient's history has been reviewed, patient examined, no change in status, stable for surgery.  I have reviewed the patient's chart and labs.  Questions were answered to the patient's satisfaction.     [name]

## 2023-02-11 NOTE — Anesthesia Procedure Notes (Signed)
Procedure Name: Intubation Date/Time: 02/11/2023 12:52 PM  Performed by: Aundria Rud, CRNAPre-anesthesia Checklist: Patient identified, Emergency Drugs available, Suction available and Patient being monitored Patient Re-evaluated:Patient Re-evaluated prior to induction Oxygen Delivery Method: Circle System Utilized Preoxygenation: Pre-oxygenation with 100% oxygen Induction Type: IV induction Ventilation: Two handed mask ventilation required and Oral airway inserted - appropriate to patient size Laryngoscope Size: Mac and 4 Grade View: Grade I Tube type: Oral Tube size: 7.5 mm Number of attempts: 1 Airway Equipment and Method: Stylet and Oral airway Placement Confirmation: ETT inserted through vocal cords under direct vision, positive ETCO2 and breath sounds checked- equal and bilateral Secured at: 22 cm Tube secured with: Tape Dental Injury: Teeth and Oropharynx as per pre-operative assessment

## 2023-02-11 NOTE — Transfer of Care (Signed)
Immediate Anesthesia Transfer of Care Note  Patient: Chase Lloyd  Procedure(s) Performed: RIGHT ILIO-FEMORAL ENDARTERECTOMY WITH PATCH ANGIOPLASTY (Right: Leg Upper)  Patient Location: PACU  Anesthesia Type:General  Level of Consciousness: awake, alert , and oriented  Airway & Oxygen Therapy: Patient Spontanous Breathing and Patient connected to face mask oxygen  Post-op Assessment: Report given to RN, Post -op Vital signs reviewed and stable, and Patient moving all extremities X 4  Post vital signs: Reviewed and stable  Last Vitals:  Vitals Value Taken Time  BP 117/51 02/11/23 1500  Temp    Pulse 92 02/11/23 1512  Resp 15 02/11/23 1512  SpO2 95 % 02/11/23 1512  Vitals shown include unvalidated device data.  Last Pain:  Vitals:   02/11/23 1040  TempSrc:   PainSc: 0-No pain      Patients Stated Pain Goal: 0 (02/11/23 1040)  Complications: No notable events documented.

## 2023-02-11 NOTE — Op Note (Signed)
DATE OF SERVICE: 02/11/2023  PATIENT:  Chase Lloyd  67 y.o. male  PRE-OPERATIVE DIAGNOSIS:  right leg disabling claudication  POST-OPERATIVE DIAGNOSIS:  Same  PROCEDURE:   right iliofemoral endarterectomy with bovine pericardial patch angioplasty  SURGEON:  Surgeon(s) and Role:    * [name], MD - Primary  ASSISTANT: Lianne Cure, PA-C  An experienced assistant was required given the complexity of this procedure and the standard of surgical care. My assistant helped with exposure through counter tension, suctioning, ligation and retraction to better visualize the surgical field.  My assistant expedited sewing during the case by following my sutures. Wherever I use the term "we" in the report, my assistant actively helped me with that portion of the procedure.  ANESTHESIA:   general  EBL:  BLOOD ADMINISTERED:none  DRAINS: none   LOCAL MEDICATIONS USED:  NONE  SPECIMEN:  none  COUNTS: confirmed correct.  TOURNIQUET:  none  PATIENT DISPOSITION:  PACU - hemodynamically stable.   Delay start of Pharmacological VTE agent (>24hrs) due to surgical blood loss or risk of bleeding: no  INDICATION FOR PROCEDURE: Chase Lloyd is a 67 y.o. male with disabling right leg claudication. He underwent angiography which revealed right common femoral artery occlusion. After careful discussion of risks, benefits, and alternatives the patient was offered right femoral endarterectomy. The patient understood and wished to proceed.  OPERATIVE FINDINGS: successful endarterectomy. Good doppler flow in profunda after endarterectomy.  DESCRIPTION OF PROCEDURE: After identification of the patient in the pre-operative holding area, the patient was transferred to the operating room. The patient was positioned supine on the operating room table. Anesthesia was induced. The right leg was prepped and draped in standard fashion. A surgical pause was performed confirming correct patient,  procedure, and operative location.  An oblique incision was made in the right groin and carried down through subcutaneous tissue until the femoral sheath was encountered.  This was incised carefully.  The common femoral artery and its bifurcation was skeletonized.  Silastic Vesseloops were placed around the artery.  Carried exposure onto the external iliac artery.  The patient was systemically heparinized.  Activated clotting time measurements were used throughout the case to confirm adequate anticoagulation.  Henley clamp was applied to the external iliac artery just above the circumflex iliac artery.  The Silastic Vesseloops were secured down to control the profunda femoris arteries and superficial femoral artery.  Anterior arteriotomy was made with an 11 blade and extended with Potts scissors.  Thrombosed plaque was encountered in the common femoral artery.  This was occlusive.  An endarterectomy plane was developed and continued to healthy external iliac artery proximally and healthy profunda femoris artery distally.  Preoperative angiogram suggested the superficial femoral artery was occluded 100 canal.  I was able to remove all plaque from the origin of the superficial femoral artery and profunda femoris artery.  Excellent backbleeding was achieved from the profunda femoris arteries.  A bovine pericardial patch was brought onto the field and prepared per manufacturer's instructions.  This was sewn to the common femoral arteriotomy using continuous running suture of 5-0 Prolene.  Prior to completion the patch was flushed and de-aired.  Several repair stitches were needed to render the patch hemostatic.  The repair was then evaluated with Doppler machine.  Brisk Doppler flow was heard in the profunda femoris arteries.  Satisfied we ended the case here.  Heparin was reversed with protamine.  The wounds were closed in layers using 2-0 Vicryl, 3-0 Vicryl,  4-0 Monocryl.  Upon completion of the case instrument  and sharps counts were confirmed correct. The patient was transferred to the PACU in good condition. I was present for all portions of the procedure.  Chase Lloyd. [name], MD [facility] Phone Number: [phone] 02/11/2023 2:41 PM

## 2023-02-11 NOTE — Progress Notes (Signed)
     Doppler Dp/PT right LE Right groin soft without hematoma  Stable post op right iliofemoral endarterectomy  Mosetta Pigeon PA-C

## 2023-02-11 NOTE — Progress Notes (Signed)
Pt is alert and fully oriented x 4, afebrile, no acute distress, stable hemodynamically, SPO2 91-94% with normal respiration, on 2 LPM of NCL as his home baseline. NSR on the monitor, HR 90s.   Hypokalemia, K 2.9. KCL 40 mEq given at 11:30 pm. We will follow up BMP at am. Anemia, Hb 7.1, Hct 21%. Pt has no current active bleeding or hematoma on right groin or left groin. Only minimal bruising on left groin, soft to touch, no tenderness or pain. MD aware. We will check CBC at am with routine morning lab.   Doppler pulses: good signals of PT and DP on both legs. Pt denies pain. He is able to rest well with no acute distress overnight.    Latest Reference Range & Units Most Recent  Hemoglobin 13.0 - 17.0 g/dL 7.1 (L) 03/06/87 82:80  HCT 39.0 - 52.0 % 21.0 (L) 02/11/23 13:30  (L): Data is abnormally low   Latest Reference Range & Units Most Recent  Potassium 3.5 - 5.1 mmol/L 2.9 (L) 02/11/23 13:30  (L): Data is abnormally low  Filiberto Pinks, RN

## 2023-02-11 NOTE — Anesthesia Postprocedure Evaluation (Signed)
Anesthesia Post Note  Patient: Chase Lloyd  Procedure(s) Performed: RIGHT ILIO-FEMORAL ENDARTERECTOMY WITH PATCH ANGIOPLASTY (Right: Leg Upper)     Patient location during evaluation: PACU Anesthesia Type: General Level of consciousness: awake and alert Pain management: pain level controlled Vital Signs Assessment: post-procedure vital signs reviewed and stable Respiratory status: spontaneous breathing, nonlabored ventilation, respiratory function stable and patient connected to nasal cannula oxygen Cardiovascular status: blood pressure returned to baseline and stable Postop Assessment: no apparent nausea or vomiting Anesthetic complications: no  No notable events documented.  Last Vitals:  Vitals:   02/11/23 1525 02/11/23 1608  BP: (!) 100/49 130/60  Pulse: 91 99  Resp: 15 19  Temp: 37.2 C 36.6 C  SpO2: 92% 96%    Last Pain:  Vitals:   02/11/23 1608  TempSrc: Oral  PainSc:                  Keywon Mestre,W. EDMOND

## 2023-02-11 NOTE — Progress Notes (Incomplete)
Pt is alert and fully oriented x 4, afebrile, no acute distress, stable hemodynamically, SPO2 91-94% with normal respiration, on 2 LPM of NCL as his home baseline. NSR on the monitor, HR 90s.   Hypokalemia, K 2.9. KCL 40 mEq was given at 11:30 pm. We will follow up BMP at am. Anemia, Hb 7.1, Hct 21%. Pt has no current active bleeding or hematoma on right groin or left groin. Only minimal bruising on left groin, soft to touch, no tenderness or pain. MD was aware. We will monitor CBC at am with routine morning lab.   Doppler pulses: good signals of PT and DP on both legs. Pt denies pain. He is able to rest well with no acute distress overnight.    Latest Reference Range & Units Most Recent  Hemoglobin 13.0 - 17.0 g/dL 7.1 (L) 3/88/82 80:03  HCT 39.0 - 52.0 % 21.0 (L) 02/11/23 13:30  (L): Data is abnormally low   Latest Reference Range & Units Most Recent  Potassium 3.5 - 5.1 mmol/L 2.9 (L) 02/11/23 13:30  (L): Data is abnormally low  Filiberto Pinks, RN

## 2023-02-12 ENCOUNTER — Telehealth: Payer: Self-pay | Admitting: Adult Health

## 2023-02-12 DIAGNOSIS — Z7189 Other specified counseling: Secondary | ICD-10-CM

## 2023-02-12 LAB — LIPID PANEL
Cholesterol: 99 mg/dL (ref 0–200)
HDL: 45 mg/dL (ref >40)
LDL Cholesterol: 41 mg/dL (ref 0–99)
Total CHOL/HDL Ratio: 2.2 RATIO
Triglycerides: 67 mg/dL (ref <150)
VLDL: 13 mg/dL (ref 0–40)

## 2023-02-12 LAB — BASIC METABOLIC PANEL
Anion gap: 11 (ref 5–15)
BUN: 9 mg/dL (ref 8–23)
CO2: 25 mmol/L (ref 22–32)
Calcium: 7.7 mg/dL — ABNORMAL LOW (ref 8.9–10.3)
Chloride: 102 mmol/L (ref 98–111)
Creatinine, Ser: 1.24 mg/dL (ref 0.61–1.24)
GFR, Estimated: >60 mL/min (ref >60)
Glucose, Bld: 103 mg/dL — ABNORMAL HIGH (ref 70–99)
Potassium: 3.8 mmol/L (ref 3.5–5.1)
Sodium: 138 mmol/L (ref 135–145)

## 2023-02-12 LAB — CBC
HCT: 25.4 % — ABNORMAL LOW (ref 39.0–52.0)
Hemoglobin: 8.1 g/dL — ABNORMAL LOW (ref 13.0–17.0)
MCH: 31.5 pg (ref 26.0–34.0)
MCHC: 31.9 g/dL (ref 30.0–36.0)
MCV: 98.8 fL (ref 80.0–100.0)
Platelets: 119 10*3/uL — ABNORMAL LOW (ref 150–400)
RBC: 2.57 MIL/uL — ABNORMAL LOW (ref 4.22–5.81)
RDW: 13.2 % (ref 11.5–15.5)
WBC: 5.2 10*3/uL (ref 4.0–10.5)
nRBC: 0 % (ref 0.0–0.2)

## 2023-02-12 MED ORDER — ORAL CARE MOUTH RINSE: 15.0000 mL | OROMUCOSAL | Status: DC | PRN

## 2023-02-12 NOTE — Progress Notes (Signed)
[facility] [place]  Subjective  - Doing well ambulated and pain controlled   Objective (!) 113/46 (!) 110 98.3 F (36.8 C) (Oral) 17 96%  Intake/Output Summary (Last 24 hours) at 02/12/2023 0809 Last data filed at 02/12/2023 0400 Gross per 24 hour  Intake 3250 ml  Output 1450 ml  Net 1800 ml   Doppler Dp/PT right LE Right groin soft without hematoma M/N intact   Assessment/Planning: POD # 1 right iliofemoral endarterectomy   Brisk doppler DP/PT, weaker peroneal signal improved inflow  No equipment needs Plan for discharge home today in stable condition Asymptomatic post op anemia EBL 150 likely dilutional He does not want narcotics and will take tylenol for pain control F/U in 2 weeks for incision ck    Chase Lloyd 02/12/2023 8:09 AM --  Laboratory Lab Results: Recent Labs    02/11/23 0932 02/11/23 1330 02/12/23 0440  WBC 4.3  --  5.2  HGB 10.5* 7.1* 8.1*  HCT 31.4* 21.0* 25.4*  PLT 116*  --  119*   BMET Recent Labs    02/11/23 0932 02/11/23 1330 02/12/23 0440  NA 140 142 138  K 3.1* 2.9* 3.8  CL 103  --  102  CO2 28  --  25  GLUCOSE 97  --  103*  BUN 8  --  9  CREATININE 1.19  --  1.24  CALCIUM 8.4*  --  7.7*    COAG Lab Results  Component Value Date   INR 1.0 02/11/2023   INR 1.0 02/07/2023   INR 0.96 10/06/2018   No results found for: "PTT"

## 2023-02-12 NOTE — Discharge Instructions (Signed)
 [facility] [place]  Discharge instructions  Lower Extremity Bypass Surgery  Please refer to the following instruction for your post-procedure care. Your surgeon or physician assistant will discuss any changes with you.  Activity  You are encouraged to walk as much as you can. You can slowly return to normal activities during the month after your surgery. Avoid strenuous activity and heavy lifting until your doctor tells you it's OK. Avoid activities such as vacuuming or swinging a golf club. Do not drive until your doctor give the OK and you are no longer taking prescription pain medications. It is also normal to have difficulty with sleep habits, eating and bowel movement after surgery. These will go away with time.  Bathing/Showering  You may shower after you go home. Do not soak in a bathtub, hot tub, or swim until the incision heals completely.  Incision Care  Clean your incision with mild soap and water. Shower every day. Pat the area dry with a clean towel. You do not need a bandage unless otherwise instructed. Do not apply any ointments or creams to your incision. If you have open wounds you will be instructed how to care for them or a visiting nurse may be arranged for you. If you have staples or sutures along your incision they will be removed at your post-op appointment. You may have skin glue on your incision. Do not peel it off. It will come off on its own in about one week. If you have a great deal of moisture in your groin, use a gauze help keep this area dry.  Diet  Resume your normal diet. There are no special food restrictions following this procedure. A low fat/ low cholesterol diet is recommended for all patients with vascular disease. In order to heal from your surgery, it is CRITICAL to get adequate nutrition. Your body requires vitamins, minerals, and protein. Vegetables are the best source of vitamins and minerals. Vegetables also provide the  perfect balance of protein. Processed food has little nutritional value, so try to avoid this.  Medications  Resume taking all your medications unless your doctor or nurse practitioner tells you not to. If your incision is causing pain, you may take over-the-counter pain relievers such as acetaminophen (Tylenol). If you were prescribed a stronger pain medication, please aware these medication can cause nausea and constipation. Prevent nausea by taking the medication with a snack or meal. Avoid constipation by drinking plenty of fluids and eating foods with high amount of fiber, such as fruits, vegetables, and grains. Take Colase 100 mg (an over-the-counter stool softener) twice a day as needed for constipation. Do not take Tylenol if you are taking prescription pain medications.  Follow Up  Our office will schedule a follow up appointment 2-3 weeks following discharge.  Please call us immediately for any of the following conditions  Severe or worsening pain in your legs or feet while at rest or while walking Increase pain, redness, warmth, or drainage (pus) from your incision site(s) Fever of 101 degree or higher The swelling in your leg with the bypass suddenly worsens and becomes more painful than when you were in the hospital If you have been instructed to feel your graft pulse then you should do so every day. If you can no longer feel this pulse, call the office immediately. Not all patients are given this instruction.  Leg swelling is common after leg bypass surgery.  The swelling should improve over a few months   following surgery. To improve the swelling, you may elevate your legs above the level of your heart while you are sitting or resting. Your surgeon or physician assistant may ask you to apply an ACE wrap or wear compression (TED) stockings to help to reduce swelling.  Reduce your risk of vascular disease  Stop smoking. If you would like help call [facility] at 1-800-QUIT-NOW  (1-800-784-8669) or [facility] at [phone].  Manage your cholesterol Maintain a desired weight Control your diabetes weight Control your diabetes Keep your blood pressure down  If you have any questions, please call the office at [phone]   

## 2023-02-12 NOTE — TOC Transition Note (Signed)
Transition of Care (TOC) - CM/SW Discharge Note Donn Pierini RN, BSN Transitions of Care Unit 4E- RN Case Manager See Treatment Team for direct phone #   Patient Details  Name: Chase Lloyd MRN: [id number] Date of Birth: Nov 13, 1955  [facility]) CM/SW Contact:  Darrold Span, RN Phone Number: 02/12/2023, 12:47 PM   Clinical Narrative:    Pt stable for transition home today, CM notified that [facility] following with VSS office protocol referral- CM notified liaison for start of care and they will contact pt for scheduling.   No further TOC needs noted.    Final next level of care: Home w Home Health Services Barriers to Discharge: No Barriers Identified   Patient Goals and CMS Choice   Choice offered to / list presented to : NA  Discharge Placement                 Home w/ [facility]        Discharge Plan and Services Additional resources added to the After Visit Summary for       Post Acute Care Choice: Home Health                      [facility] Agency: [facility] Date [facility] Contacted: 02/12/23   Representative spoke with at [facility] Agency: Bjorn Loser  Social Determinants of Health (SDOH) Interventions SDOH Screenings   Food Insecurity: No Food Insecurity (02/11/2023)  Housing: Low Risk  (02/11/2023)  Transportation Needs: No Transportation Needs (02/11/2023)  Utilities: Not At Risk (02/11/2023)  Alcohol Screen: Low Risk  (11/13/2022)  Depression (PHQ2-9): Low Risk  (01/15/2023)  Financial Resource Strain: Low Risk  (11/13/2022)  Physical Activity: Insufficiently Active (11/13/2022)  Social Connections: Moderately Isolated (11/13/2022)  Stress: No Stress Concern Present (11/13/2022)  Tobacco Use: Medium Risk (02/11/2023)     Readmission Risk Interventions     No data to display

## 2023-02-12 NOTE — Progress Notes (Signed)
PHARMACIST LIPID MONITORING   Chase Lloyd is a 67 y.o. male admitted on 02/11/2023 with PVD.  Pharmacy has been consulted to optimize lipid-lowering therapy with the indication of secondary prevention for clinical ASCVD.  Recent Labs:  Lipid Panel (last 6 months):   Lab Results  Component Value Date   CHOL 99 02/12/2023   TRIG 67 02/12/2023   HDL 45 02/12/2023   CHOLHDL 2.2 02/12/2023   VLDL 13 02/12/2023   LDLCALC 41 02/12/2023   LDLDIRECT 49 12/31/2022    Hepatic function panel (last 6 months):   Lab Results  Component Value Date   AST 32 02/11/2023   ALT 21 02/11/2023   ALKPHOS 56 02/11/2023   BILITOT 1.2 02/11/2023    SCr (since admission):   Serum creatinine: 1.24 mg/dL 53/64/68 0321 Estimated creatinine clearance: 60.7 mL/min  Current therapy and lipid therapy tolerance Current lipid-lowering therapy: crestor 40mg  daily Documented or reported allergies or intolerances to lipid-lowering therapies (if applicable): none   Plan:    1.Statin intensity (high intensity recommended for all patients regardless of the LDL):  No statin changes. The patient is already on a high intensity statin.  2.Add ezetimibe (if any one of the following):   Not indicated at this time.  3.Refer to lipid clinic:   No  4.Follow-up with:  Primary care provider - Shirline Frees, NP  5.Follow-up labs after discharge:  No changes in lipid therapy, repeat a lipid panel in one year.     Harland German, PharmD Clinical Pharmacist **Pharmacist phone directory can now be found on [email or web address] ([email or web address]).  Listed under [facility].

## 2023-02-12 NOTE — Evaluation (Signed)
Physical Therapy Evaluation Patient Details Name: Chase Lloyd MRN: [id number] DOB: [birth year] Today's Date: 02/12/2023  History of Present Illness  67 yo male admitted 4/10 for Rt iliofemoral endarterectomy with angioplasty. PMhx: COPD, GERD, HTN, CAD, HLD, PVD  Clinical Impression  Pt very pleasant and eager to return home. Pt states he wears oxygen at night and not during the day at home. Pt with desaturation to 83% on 2L with stair ambulation and 89% on 3L with gait. Pt educated for monitoring with home pulse ox and increased ambulation movement post sx. Pt stated understanding for all education without further acute needs at this time will sign off with daily mobility encouraged.         Recommendations for follow up therapy are one component of a multi-disciplinary discharge planning process, led by the attending physician.  Recommendations may be updated based on patient status, additional functional criteria and insurance authorization.  Follow Up Recommendations       Assistance Recommended at Discharge PRN  Patient can return home with the following  Assistance with cooking/housework;Assist for transportation    Equipment Recommendations None recommended by PT  Recommendations for Other Services       Functional Status Assessment Patient has not had a recent decline in their functional status     Precautions / Restrictions Precautions Precautions: Fall;Other (comment) Precaution Comments: watch sats Restrictions Weight Bearing Restrictions: No      Mobility  Bed Mobility Overal bed mobility: Modified Independent             General bed mobility comments: HOB 25 degrees with adjustable bed at home    Transfers Overall transfer level: Modified independent                 General transfer comment: stood without assist, decreased control of descent    Ambulation/Gait Ambulation/Gait assistance: Modified independent (Device/Increase time) Gait  Distance (Feet): 300 Feet Assistive device: Rolling walker (2 wheels) Gait Pattern/deviations: Step-through pattern, Decreased stride length   Gait velocity interpretation: 1.31 - 2.62 ft/sec, indicative of limited community ambulator   General Gait Details: cues for posture and breathing technique with pt reliant on O2 3L with drop to 89% with hall ambulation  Stairs Stairs: Yes Stairs assistance: Modified independent (Device/Increase time) Stair Management: One rail Right, Alternating pattern, Forwards Number of Stairs: 11 General stair comments: good safety and stability for stairs with drop in O2 to 83% on 2L and required 4L and 1 min standing rest to recover to 93%  Wheelchair Mobility    Modified Rankin (Stroke Patients Only)       Balance Overall balance assessment: Mild deficits observed, not formally tested                                           Pertinent Vitals/Pain Pain Assessment Pain Assessment: No/denies pain    Home Living Family/patient expects to be discharged to:: Private residence Living Arrangements: Spouse/significant other;Children Available Help at Discharge: Family;Available 24 hours/day Type of Home: House Home Access: Stairs to enter Entrance Stairs-Rails: Left;Right;Can reach both Entrance Stairs-Number of Steps: 3   Home Layout: One level Home Equipment: Shower seat;Other (comment);Grab bars - tub/shower;Grab bars - toilet;Hand held shower head;Rollator (4 wheels) Additional Comments: adjustable bed    Prior Function Prior Level of Function : Driving;Independent/Modified Independent  Mobility Comments: uses upright or 3 wheel rollator ADLs Comments: Independent in bADLs/iADLs     Hand Dominance        Extremity/Trunk Assessment   Upper Extremity Assessment Upper Extremity Assessment: Overall WFL for tasks assessed    Lower Extremity Assessment Lower Extremity Assessment: Overall WFL for tasks  assessed    Cervical / Trunk Assessment Cervical / Trunk Assessment: Normal  Communication   Communication: No difficulties  Cognition Arousal/Alertness: Awake/alert Behavior During Therapy: WFL for tasks assessed/performed Overall Cognitive Status: Within Functional Limits for tasks assessed                                          General Comments General comments (skin integrity, edema, etc.): VSS on 2L, Sp02 desat to 77% durign functional ambulation but waveform inaccurate but 02 incerased to 4L for safety to return to normal range 93% and up, displayed Sp02 at 93% when weaned down to 2L on return ambulation    Exercises General Exercises - Lower Extremity Hip Flexion/Marching: AROM, Right, 10 reps, Seated   Assessment/Plan    PT Assessment Patient does not need any further PT services  PT Problem List         PT Treatment Interventions      PT Goals (Current goals can be found in the Care Plan section)  Acute Rehab PT Goals PT Goal Formulation: All assessment and education complete, DC therapy    Frequency       Co-evaluation               AM-PAC PT "6 Clicks" Mobility  Outcome Measure Help needed turning from your back to your side while in a flat bed without using bedrails?: None Help needed moving from lying on your back to sitting on the side of a flat bed without using bedrails?: None Help needed moving to and from a bed to a chair (including a wheelchair)?: None Help needed standing up from a chair using your arms (e.g., wheelchair or bedside chair)?: None Help needed to walk in hospital room?: None Help needed climbing 3-5 steps with a railing? : None 6 Click Score: 24    End of Session Equipment Utilized During Treatment: Oxygen Activity Tolerance: Patient tolerated treatment well Patient left: in chair;with call bell/phone within reach Nurse Communication: Mobility status PT Visit Diagnosis: Other abnormalities of gait and  mobility (R26.89)    Time: 2500-3704 PT Time Calculation (min) (ACUTE ONLY): 24 min   Charges:   PT Evaluation $PT Eval Moderate Complexity: 1 Mod          Edinson Domeier P, PT Acute Rehabilitation Services Office: [phone]   Enedina Finner Tramya Schoenfelder 02/12/2023, 10:20 AM

## 2023-02-12 NOTE — Evaluation (Signed)
Occupational Therapy Evaluation and DC Summary Patient Details Name: Chase Lloyd MRN: [id number] DOB: [birth year] Today's Date: 02/12/2023   History of Present Illness 67 yo male admitted 4/10 for Rt iliofemoral endarterectomy with angioplasty. PMhx: COPD, GERD, HTN, CAD, HLD, PVD   Clinical Impression   Pt admitted for above dx, PTA he lived with spouse and son and was independent in bADLs/iADLs, completing functional ambulation with no AD in home and upright walker in community. Patient currently completing bADLs and functional ambulation with supervision to Mod I and functioning at or near baseline. Pt HR up  to 126 max with functional ambulation and Sp02 around 93% while on 2L during activity. Pt has no further post acute skilled OT needs. No follow-up OT recommended at this time       Recommendations for follow up therapy are one component of a multi-disciplinary discharge planning process, led by the attending physician.  Recommendations may be updated based on patient status, additional functional criteria and insurance authorization.   Assistance Recommended at Discharge PRN  Patient can return home with the following Assistance with cooking/housework    Functional Status Assessment  Patient has not had a recent decline in their functional status  Equipment Recommendations  None recommended by OT    Recommendations for Other Services       Precautions / Restrictions Precautions Precautions: Fall Restrictions Weight Bearing Restrictions: No      Mobility Bed Mobility Overal bed mobility: Modified Independent                  Transfers Overall transfer level: Needs assistance Equipment used: None Transfers: Sit to/from Stand Sit to Stand: Modified independent (Device/Increase time)                  Balance Overall balance assessment: Mild deficits observed, not formally tested                                         ADL either  performed or assessed with clinical judgement   ADL Overall ADL's : Needs assistance/impaired Eating/Feeding: Independent;Sitting   Grooming: Standing;Supervision/safety   Upper Body Bathing: Supervision/ safety;Standing   Lower Body Bathing: Supervison/ safety;Sitting/lateral leans   Upper Body Dressing : Supervision/safety;Standing Upper Body Dressing Details (indicate cue type and reason): Pt donned/doffed gown in jacket like fashion standing at bedside Lower Body Dressing: Supervision/safety;Sitting/lateral leans Lower Body Dressing Details (indicate cue type and reason): doff/don socks sitting EOB Toilet Transfer: Ambulation;Supervision/safety Toilet Transfer Details (indicate cue type and reason): no AD Toileting- Clothing Manipulation and Hygiene: Supervision/safety;Sit to/from stand   Tub/ Shower Transfer: Supervision/safety;Ambulation   Functional mobility during ADLs: Supervision/safety (+ EVA walker) General ADL Comments: Room level ambulation no AD, hall level ambulation with EVA walker     Vision Baseline Vision/History: 1 Wears glasses Patient Visual Report: No change from baseline       Perception     Praxis      Pertinent Vitals/Pain Pain Assessment Pain Assessment: No/denies pain     Hand Dominance     Extremity/Trunk Assessment Upper Extremity Assessment Upper Extremity Assessment: Overall WFL for tasks assessed   Lower Extremity Assessment Lower Extremity Assessment: Overall WFL for tasks assessed   Cervical / Trunk Assessment Cervical / Trunk Assessment: Normal   Communication Communication Communication: No difficulties   Cognition Arousal/Alertness: Awake/alert Behavior During Therapy: WFL for tasks assessed/performed Overall Cognitive  Status: Within Functional Limits for tasks assessed                                       General Comments  VSS on 2L, Sp02 desat to 77% durign functional ambulation but waveform  inaccurate but 02 incerased to 4L for safety to return to normal range 93% and up, displayed Sp02 at 93% when weaned down to 2L on return ambulation    Exercises     Shoulder Instructions      Home Living Family/patient expects to be discharged to:: Private residence Living Arrangements: Spouse/significant other;Children Available Help at Discharge: Family;Available 24 hours/day Type of Home: House Home Access: Stairs to enter [employer] of Steps: 3 Entrance Stairs-Rails: Left;Right;Can reach both Home Layout: One level     Bathroom Shower/Tub: Producer, television/film/video: Standard Bathroom Accessibility: Yes How Accessible: Accessible via walker Home Equipment: Shower seat;Other (comment);Grab bars - tub/shower;Grab bars - toilet;Hand held shower head;Rollator (4 wheels)   Additional Comments: adjustable bed      Prior Functioning/Environment Prior Level of Function : Driving;Independent/Modified Independent             Mobility Comments: uses upright or 3 wheel rollator ADLs Comments: Independent in bADLs/iADLs        OT Problem List: Impaired balance (sitting and/or standing)      OT Treatment/Interventions:      OT Goals(Current goals can be found in the care plan section) Acute Rehab OT Goals Patient Stated Goal: To get out of here(hospital) OT Goal Formulation: With patient Time For Goal Achievement: 02/26/23 Potential to Achieve Goals: Good  OT Frequency:      Co-evaluation              AM-PAC OT "6 Clicks" Daily Activity     Outcome Measure Help from another person eating meals?: None Help from another person taking care of personal grooming?: None Help from another person toileting, which includes using toliet, bedpan, or urinal?: A Little Help from another person bathing (including washing, rinsing, drying)?: A Little Help from another person to put on and taking off regular upper body clothing?: None Help from another  person to put on and taking off regular lower body clothing?: A Little 6 Click Score: 21   End of Session Equipment Utilized During Treatment: Gait belt;Other (comment) (EVA walker) Nurse Communication: Mobility status  Activity Tolerance: Patient tolerated treatment well Patient left: in bed;Other (comment) (With PT)  OT Visit Diagnosis: Unsteadiness on feet (R26.81);Other abnormalities of gait and mobility (R26.89)                Time: 4497-5300 OT Time Calculation (min): 32 min Charges:  OT General Charges $OT Visit: 1 Visit OT Evaluation $OT Eval Moderate Complexity: 1 Mod OT Treatments $Therapeutic Activity: 8-22 mins  02/12/2023  AB, OTR/L  Acute Rehabilitation Services  Office: [phone]   Tristan Schroeder 02/12/2023, 9:53 AM

## 2023-02-12 NOTE — Telephone Encounter (Signed)
Requesting a referral for audiology, patient needs new hearing aids

## 2023-02-13 ENCOUNTER — Telehealth: Payer: Self-pay | Admitting: Adult Health

## 2023-02-13 ENCOUNTER — Telehealth: Payer: Self-pay

## 2023-02-13 NOTE — Telephone Encounter (Signed)
Okay for referral?

## 2023-02-13 NOTE — Addendum Note (Signed)
Addended by: Waymon Amato R on: 02/13/2023 01:29 PM   Modules accepted: Orders

## 2023-02-13 NOTE — Transitions of Care (Post Inpatient/ED Visit) (Signed)
   02/13/2023  Name: Chase Lloyd MRN: [id number] DOB: 14-Jun-1956  Today's TOC FU Call Status: Today's TOC FU Call Status:: Successful TOC FU Call Competed TOC FU Call Complete Date: 02/13/23  Transition Care Management Follow-up Telephone Call Date of Discharge: 02/12/23 Discharge Facility: [facility]) Type of Discharge: Inpatient Admission Primary Inpatient Discharge Diagnosis:: "right ilio-femoral endarectomy w/ patch angioplasty" How have you been since you were released from the hospital?: Better (Pt states he rested a few hrs last night-has not ate anything-pain controlled with Tylenol prn. He has been having some occasional productive cough-using neb txs) Any questions or concerns?: No  Items Reviewed: Did you receive and understand the discharge instructions provided?: Yes Medications obtained and verified?: Yes (Medications Reviewed) Any new allergies since your discharge?: No Dietary orders reviewed?: Yes Type of Diet Ordered:: low salt/heart healthy Do you have support at home?: Yes People in Home: spouse Name of Support/Comfort Primary Source: [facility] and Equipment/Supplies: Were Home Health Services Ordered?: Yes Name of Home Health Agency:: [phone] Has Agency set up a time to come to your home?: No Any new equipment or medical supplies ordered?: No  Functional Questionnaire: Do you need assistance with bathing/showering or dressing?: No Do you need assistance with meal preparation?: No Do you need assistance with eating?: No Do you have difficulty maintaining continence: No Do you need assistance with getting out of bed/getting out of a chair/moving?: No Do you have difficulty managing or taking your medications?: No  Follow up appointments reviewed: PCP Follow-up appointment confirmed?: NA Specialist Hospital Follow-up appointment confirmed?: No Reason Specialist Follow-Up Not Confirmed: Patient has Specialist Provider Number and will Call for  Appointment (per d/c instructions surgeon office to call pt to arrange appt-pt aware to follow up with office if he has not heard from them in a few days) Do you need transportation to your follow-up appointment?: No Do you understand care options if your condition(s) worsen?: Yes-patient verbalized understanding   TOC Interventions Today    Flowsheet Row Most Recent Value  TOC Interventions   TOC Interventions Discussed/Reviewed TOC Interventions Discussed, Post discharge activity limitations per provider, Post op wound/incision care, S/S of infection      Interventions Today    Flowsheet Row Most Recent Value  General Interventions   General Interventions Discussed/Reviewed General Interventions Discussed, Doctor Visits  Doctor Visits Discussed/Reviewed Doctor Visits Discussed, Specialist  PCP/Specialist Visits Compliance with follow-up visit  Exercise Interventions   Exercise Discussed/Reviewed Exercise Discussed, Physical Activity  Physical Activity Discussed/Reviewed Physical Activity Discussed  Education Interventions   Education Provided Provided Education  Provided Verbal Education On Nutrition, Medication, When to see the doctor  Nutrition Interventions   Nutrition Discussed/Reviewed Nutrition Discussed, Adding fruits and vegetables, Decreasing salt  Pharmacy Interventions   Pharmacy Dicussed/Reviewed Pharmacy Topics Discussed, Medications and their functions       Alessandra Grout [facility] Care Management Care Management Community Coordinator Direct Phone: [phone] Toll Free: [phone] Fax: [phone]

## 2023-02-13 NOTE — Discharge Summary (Signed)
[facility] Discharge Summary   Patient ID:  Chase Lloyd MRN: [id number] DOB/AGE: Mar 18, 1956 67 y.o.  Admit date: 02/11/2023 Discharge date: 02/12/23 Date of Surgery: 02/11/2023 Surgeon: Surgeon(s): [name], MD  Admission Diagnosis: PAD (peripheral artery disease) [I73.9]  Discharge Diagnoses:  PAD (peripheral artery disease) [I73.9]  Secondary Diagnoses: Past Medical History:  Diagnosis Date   Arthritis    Back pain    COPD (chronic obstructive pulmonary disease)    Coronary artery disease    COVID    mild case   DDD (degenerative disc disease), lumbar    Depression    Dyspnea    uses O2 as needed   Emphysema of lung    Empyema    2016  (was in [place])   GERD (gastroesophageal reflux disease)    Hearing loss of right ear    started 2001.     microphone in right ear ,  and hearing aid in left   Hodgkin's disease 1999   Hypotension    Meniere's disease    PAD (peripheral artery disease)    Pneumonia    Tinnitus     Procedure(s): RIGHT ILIO-FEMORAL ENDARTERECTOMY WITH PATCH ANGIOPLASTY  Discharged Condition: stable     Hospital Course:  Chase Lloyd is a 67 y.o. male is S/P  Procedure(s): RIGHT ILIO-FEMORAL ENDARTERECTOMY WITH PATCH ANGIOPLASTY He developed disabling claudication in the right LE angiogram revealed right common femoral occlusion.  He had relief of the claudication pain at short distances prior to leaving the hospital.  He now has Brisk doppler flow Dp/PT signals.  He request no narcotics, he will control his pain with Tylenol.  He has a history of narcotic abuse. He was discharged home post op day 1 in stable condition.    Significant Diagnostic Studies: CBC Lab Results  Component Value Date   WBC 5.2 02/12/2023   HGB 8.1 (L) 02/12/2023   HCT 25.4 (L) 02/12/2023   MCV 98.8 02/12/2023   PLT 119 (L) 02/12/2023    BMET    Component Value Date/Time   NA 138 02/12/2023 0440   NA 139 04/25/2019 1632   K  3.8 02/12/2023 0440   CL 102 02/12/2023 0440   CO2 25 02/12/2023 0440   GLUCOSE 103 (H) 02/12/2023 0440   BUN 9 02/12/2023 0440   BUN 11 04/25/2019 1632   CREATININE 1.24 02/12/2023 0440   CALCIUM 7.7 (L) 02/12/2023 0440   GFRNONAA >60 02/12/2023 0440   GFRAA >60 07/30/2019 1259   COAG Lab Results  Component Value Date   INR 1.0 02/11/2023   INR 1.0 02/07/2023   INR 0.96 10/06/2018     Disposition:  Discharge to :Home Discharge Instructions     Call MD for:  redness, tenderness, or signs of infection (pain, swelling, bleeding, redness, odor or green/yellow discharge around incision site)   Complete by: As directed    Call MD for:  severe or increased pain, loss or decreased feeling  in affected limb(s)   Complete by: As directed    Call MD for:  temperature >100.5   Complete by: As directed    Discharge instructions   Complete by: As directed    Gradually increase your walking start a walking program.  Shower daily as needed   Resume previous diet   Complete by: As directed       Allergies as of 02/12/2023   No Known Allergies      Medication List  TAKE these medications    albuterol (2.5 MG/3ML) 0.083% nebulizer solution Commonly known as: PROVENTIL Take 3 mLs (2.5 mg total) by nebulization every 4 (four) hours as needed for wheezing or shortness of breath.   albuterol 108 (90 Base) MCG/ACT inhaler Commonly known as: VENTOLIN HFA Inhale 2 puffs into the lungs every 6 (six) hours as needed for wheezing or shortness of breath.   Aspirin Low Dose 81 MG tablet Generic drug: aspirin EC Take 1 tablet (81 mg total) by mouth daily. Swallow whole.   Breztri Aerosphere 160-9-4.8 MCG/ACT Aero Generic drug: Budeson-Glycopyrrol-Formoterol Inhale 2 puffs into the lungs 2 (two) times daily. What changed: when to take this   clopidogrel 75 MG tablet Commonly known as: PLAVIX Take 1 tablet (75 mg total) by mouth daily with breakfast.   isosorbide mononitrate 30  MG 24 hr tablet Commonly known as: IMDUR Take 30 mg by mouth in the morning.   levofloxacin 750 MG tablet Commonly known as: Levaquin Take 1 tablet (750 mg total) by mouth daily.   metoprolol succinate 25 MG 24 hr tablet Commonly known as: TOPROL-XL Take 1 tablet (25 mg total) by mouth daily.   pantoprazole 40 MG tablet Commonly known as: PROTONIX Take 1 tablet (40 mg total) by mouth daily.   rosuvastatin 40 MG tablet Commonly known as: CRESTOR Take 1 tablet (40 mg total) by mouth daily.       Verbal and written Discharge instructions given to the patient. Wound care per Discharge AVS  Follow-up Information     [name], MD Follow up in 2 week(s).   Specialties: Vascular Surgery, Interventional Cardiology Why: Office will call you to arrange your appt (sent) Contact information: [address] [place] [address] [phone]                 Signed: Mosetta Pigeon 02/13/2023, 3:17 PM

## 2023-02-13 NOTE — Telephone Encounter (Signed)
Referral placed. Pt and spouse notified of update.

## 2023-02-13 NOTE — Telephone Encounter (Signed)
Pt wife call and stated she is returning your call and want a call back. °

## 2023-02-13 NOTE — Telephone Encounter (Signed)
Patient spouse notified of update  and verbalized understanding. 

## 2023-02-14 DIAGNOSIS — I119 Hypertensive heart disease without heart failure: Secondary | ICD-10-CM | POA: Diagnosis not present

## 2023-02-14 DIAGNOSIS — Z7902 Long term (current) use of antithrombotics/antiplatelets: Secondary | ICD-10-CM | POA: Diagnosis not present

## 2023-02-14 DIAGNOSIS — Z48812 Encounter for surgical aftercare following surgery on the circulatory system: Secondary | ICD-10-CM | POA: Diagnosis not present

## 2023-02-14 DIAGNOSIS — I739 Peripheral vascular disease, unspecified: Secondary | ICD-10-CM | POA: Diagnosis not present

## 2023-02-14 DIAGNOSIS — Z9981 Dependence on supplemental oxygen: Secondary | ICD-10-CM | POA: Diagnosis not present

## 2023-02-14 DIAGNOSIS — J449 Chronic obstructive pulmonary disease, unspecified: Secondary | ICD-10-CM | POA: Diagnosis not present

## 2023-02-14 DIAGNOSIS — I2510 Atherosclerotic heart disease of native coronary artery without angina pectoris: Secondary | ICD-10-CM | POA: Diagnosis not present

## 2023-02-14 DIAGNOSIS — Z7982 Long term (current) use of aspirin: Secondary | ICD-10-CM | POA: Diagnosis not present

## 2023-02-17 DIAGNOSIS — Z9981 Dependence on supplemental oxygen: Secondary | ICD-10-CM | POA: Diagnosis not present

## 2023-02-17 DIAGNOSIS — Z48812 Encounter for surgical aftercare following surgery on the circulatory system: Secondary | ICD-10-CM | POA: Diagnosis not present

## 2023-02-17 DIAGNOSIS — I119 Hypertensive heart disease without heart failure: Secondary | ICD-10-CM | POA: Diagnosis not present

## 2023-02-17 DIAGNOSIS — J449 Chronic obstructive pulmonary disease, unspecified: Secondary | ICD-10-CM | POA: Diagnosis not present

## 2023-02-17 DIAGNOSIS — I2510 Atherosclerotic heart disease of native coronary artery without angina pectoris: Secondary | ICD-10-CM | POA: Diagnosis not present

## 2023-02-17 DIAGNOSIS — I739 Peripheral vascular disease, unspecified: Secondary | ICD-10-CM | POA: Diagnosis not present

## 2023-02-18 ENCOUNTER — Other Ambulatory Visit (HOSPITAL_COMMUNITY): Payer: Self-pay

## 2023-02-20 ENCOUNTER — Telehealth: Payer: Self-pay | Admitting: Adult Health

## 2023-02-20 ENCOUNTER — Encounter: Payer: Self-pay | Admitting: Adult Health

## 2023-02-20 ENCOUNTER — Telehealth (INDEPENDENT_AMBULATORY_CARE_PROVIDER_SITE_OTHER): Payer: Medicare Other | Admitting: Adult Health

## 2023-02-20 VITALS — Ht 71.0 in | Wt 172.0 lb

## 2023-02-20 DIAGNOSIS — J189 Pneumonia, unspecified organism: Secondary | ICD-10-CM

## 2023-02-20 DIAGNOSIS — I119 Hypertensive heart disease without heart failure: Secondary | ICD-10-CM | POA: Diagnosis not present

## 2023-02-20 DIAGNOSIS — Z48812 Encounter for surgical aftercare following surgery on the circulatory system: Secondary | ICD-10-CM | POA: Diagnosis not present

## 2023-02-20 DIAGNOSIS — I739 Peripheral vascular disease, unspecified: Secondary | ICD-10-CM | POA: Diagnosis not present

## 2023-02-20 DIAGNOSIS — I2510 Atherosclerotic heart disease of native coronary artery without angina pectoris: Secondary | ICD-10-CM | POA: Diagnosis not present

## 2023-02-20 DIAGNOSIS — Z9981 Dependence on supplemental oxygen: Secondary | ICD-10-CM | POA: Diagnosis not present

## 2023-02-20 DIAGNOSIS — J449 Chronic obstructive pulmonary disease, unspecified: Secondary | ICD-10-CM | POA: Diagnosis not present

## 2023-02-20 MED ORDER — PREDNISONE 20 MG PO TABS
20.0000 mg | ORAL_TABLET | Freq: Every day | ORAL | 0 refills | Status: DC
Start: 2023-02-20 — End: 2023-05-29

## 2023-02-20 MED ORDER — LEVOFLOXACIN 750 MG PO TABS
750.0000 mg | ORAL_TABLET | Freq: Every day | ORAL | 0 refills | Status: DC
Start: 2023-02-20 — End: 2023-03-17

## 2023-02-20 NOTE — Telephone Encounter (Signed)
Pt called to inform NP that he just had vascular surgery of the groin about a week ago and has been wheezing a bit and wanted to know if NP could send Rx for Prednisone.  LOV:  11/22/21 = CPE  Pt has not seen NP in over a year.   Pt was offered a HFU, and declined stating he cannot walk.   Pt requested a HFU via a VV, and was informed HFU has to be an in office visit.  Pt said he will schedule a HFU in a week or so, and would like to do a VV, just to talk about the prednisone.  Pt scheduled for a VV this afternoon with NP.

## 2023-02-20 NOTE — Telephone Encounter (Signed)
Please advise 

## 2023-02-20 NOTE — Progress Notes (Signed)
Virtual Visit via Video Note  I connected with Chase Lloyd  on 02/20/23 at  3:45 PM EDT by a video enabled telemedicine application and verified that I am speaking with the correct person using two identifiers.  Location patient: home Location provider:work or home office Persons participating in the virtual visit: patient, provider  I discussed the limitations of evaluation and management by telemedicine and the availability of in person appointments. The patient expressed understanding and agreed to proceed.   HPI: 67 year old male currently diagnosed with pneumonia of the left upper lobe and left lower lobe.  He was placed on a 7-day course of Levaquin, he finished this yesterday.  Continues to have chest tightness, nonproductive cough, chills without fever, and wheezing with shortness of breath.  He does have a history of COPD.  He is using his inhalers without much improvement.   ROS: See pertinent positives and negatives per HPI.  Past Medical History:  Diagnosis Date   Arthritis    Back pain    COPD (chronic obstructive pulmonary disease)    Coronary artery disease    COVID    mild case   DDD (degenerative disc disease), lumbar    Depression    Dyspnea    uses O2 as needed   Emphysema of lung    Empyema    2016  (was in [place])   GERD (gastroesophageal reflux disease)    Hearing loss of right ear    started 2001.     microphone in right ear ,  and hearing aid in left   Hodgkin's disease 1999   Hypotension    Meniere's disease    PAD (peripheral artery disease)    Pneumonia    Tinnitus     Past Surgical History:  Procedure Laterality Date   ABDOMINAL AORTOGRAM W/LOWER EXTREMITY Bilateral 02/04/2023   Procedure: ABDOMINAL AORTOGRAM W/LOWER EXTREMITY;  Surgeon: Iran Ouch, MD;  Location: [facility];  Service: Cardiovascular;  Laterality: Bilateral;   BACK SURGERY     BIOPSY  01/15/2023   Procedure: BIOPSY;  Surgeon: Imogene Burn, MD;  Location: [facility]  ENDOSCOPY;  Service: Gastroenterology;;   CERVICAL FUSION  2016   COLONOSCOPY WITH PROPOFOL N/A 01/15/2023   Procedure: COLONOSCOPY WITH PROPOFOL;  Surgeon: Imogene Burn, MD;  Location: [facility] ENDOSCOPY;  Service: Gastroenterology;  Laterality: N/A;   ELBOW SURGERY     ENDARTERECTOMY FEMORAL Right 02/11/2023   Procedure: RIGHT ILIO-FEMORAL ENDARTERECTOMY WITH PATCH ANGIOPLASTY;  Surgeon: [name], MD;  [facility] OR;  Service: Vascular;  Laterality: Right;   ESOPHAGOGASTRODUODENOSCOPY (EGD) WITH PROPOFOL N/A 01/15/2023   Procedure: ESOPHAGOGASTRODUODENOSCOPY (EGD) WITH PROPOFOL;  Surgeon: Imogene Burn, MD;  Location: [facility] ENDOSCOPY;  Service: Gastroenterology;  Laterality: N/A;   EYE SURGERY     IR THORACENTESIS ASP PLEURAL SPACE W/IMG GUIDE  10/08/2018   LAMINECTOMY  1999   lower back surgery     PERIPHERAL VASCULAR INTERVENTION Left 02/04/2023   Procedure: PERIPHERAL VASCULAR INTERVENTION;  Surgeon: Iran Ouch, MD;  Location: [facility];  Service: Cardiovascular;  Laterality: Left;  left external iliac   POLYPECTOMY  01/15/2023   Procedure: POLYPECTOMY;  Surgeon: Imogene Burn, MD;  Location: [facility] ENDOSCOPY;  Service: Gastroenterology;;   RIGHT/LEFT HEART CATH AND CORONARY ANGIOGRAPHY N/A 04/29/2019   Procedure: RIGHT/LEFT HEART CATH AND CORONARY ANGIOGRAPHY;  Surgeon: Lennette Bihari, MD;  Location: [facility] INVAS[facility] CV LAB;  Service: Cardiovascular;  Laterality: N/A;   TONSILLECTOMY  Family History  Problem Relation Age of Onset   Hypertension Father    Diabetes Father    Testicular cancer Brother    Melanoma Brother        Current Outpatient Medications:    acetaminophen (TYLENOL) 500 MG tablet, Take 1,000 mg by mouth every 6 (six) hours as needed for mild pain., Disp: , Rfl:    albuterol (PROVENTIL) (2.5 MG/3ML) 0.083% nebulizer solution, Take 3 mLs (2.5 mg total) by nebulization every 4 (four) hours as needed for wheezing or shortness of breath., Disp: 75 mL,  Rfl: 12   albuterol (VENTOLIN HFA) 108 (90 Base) MCG/ACT inhaler, Inhale 2 puffs into the lungs every 6 (six) hours as needed for wheezing or shortness of breath., Disp: 8 g, Rfl: 6   aspirin EC 81 MG tablet, Take 1 tablet (81 mg total) by mouth daily. Swallow whole., Disp: 150 tablet, Rfl: 2   Budeson-Glycopyrrol-Formoterol (BREZTRI AEROSPHERE) 160-9-4.8 MCG/ACT AERO, Inhale 2 puffs into the lungs 2 (two) times daily. (Patient taking differently: Inhale 2 puffs into the lungs in the morning.), Disp: 3 each, Rfl: 3   clopidogrel (PLAVIX) 75 MG tablet, Take 1 tablet (75 mg total) by mouth daily with breakfast., Disp: 90 tablet, Rfl: 3   isosorbide mononitrate (IMDUR) 30 MG 24 hr tablet, Take 30 mg by mouth in the morning., Disp: , Rfl:    levofloxacin (LEVAQUIN) 750 MG tablet, Take 1 tablet (750 mg total) by mouth daily., Disp: 3 tablet, Rfl: 0   metoprolol succinate (TOPROL-XL) 25 MG 24 hr tablet, Take 1 tablet (25 mg total) by mouth daily., Disp: 90 tablet, Rfl: 3   pantoprazole (PROTONIX) 40 MG tablet, Take 1 tablet (40 mg total) by mouth daily., Disp: 90 tablet, Rfl: 3   predniSONE (DELTASONE) 20 MG tablet, Take 1 tablet (20 mg total) by mouth daily with breakfast., Disp: 30 tablet, Rfl: 0   rosuvastatin (CRESTOR) 40 MG tablet, Take 1 tablet (40 mg total) by mouth daily., Disp: 90 tablet, Rfl: 3  EXAM:  VITALS per patient if applicable:  GENERAL: alert, oriented, appears well and in no acute distress  HEENT: atraumatic, conjunttiva clear, no obvious abnormalities on inspection of external nose and ears  NECK: normal movements of the head and neck  LUNGS: on inspection no signs of respiratory distress, breathing rate appears normal, no obvious gross SOB, gasping or wheezing  CV: no obvious cyanosis  MS: moves all visible extremities without noticeable abnormality  PSYCH/NEURO: pleasant and cooperative, no obvious depression or anxiety, speech and thought processing grossly  intact  ASSESSMENT AND PLAN:  Discussed the following assessment and plan: 1. Pneumonia of left upper lobe due to infectious organism -Provide Levaquin 750 mg for 3 additional days and send in a prescription for prednisone.  Advise follow-up if not improving over the next 3 to 4 days - levofloxacin (LEVAQUIN) 750 MG tablet; Take 1 tablet (750 mg total) by mouth daily.  Dispense: 3 tablet; Refill: 0 - predniSONE (DELTASONE) 20 MG tablet; Take 1 tablet (20 mg total) by mouth daily with breakfast.  Dispense: 30 tablet; Refill: 0       I discussed the assessment and treatment plan with the patient. The patient was provided an opportunity to ask questions and all were answered. The patient agreed with the plan and demonstrated an understanding of the instructions.   The patient was advised to call back or seek an in-person evaluation if the symptoms worsen or if the condition fails to improve as  anticipated.   Shirline Frees, NP

## 2023-02-25 DIAGNOSIS — I739 Peripheral vascular disease, unspecified: Secondary | ICD-10-CM | POA: Diagnosis not present

## 2023-02-25 DIAGNOSIS — J449 Chronic obstructive pulmonary disease, unspecified: Secondary | ICD-10-CM | POA: Diagnosis not present

## 2023-02-25 DIAGNOSIS — I119 Hypertensive heart disease without heart failure: Secondary | ICD-10-CM | POA: Diagnosis not present

## 2023-02-25 DIAGNOSIS — Z48812 Encounter for surgical aftercare following surgery on the circulatory system: Secondary | ICD-10-CM | POA: Diagnosis not present

## 2023-02-25 DIAGNOSIS — Z9981 Dependence on supplemental oxygen: Secondary | ICD-10-CM | POA: Diagnosis not present

## 2023-02-25 DIAGNOSIS — I2510 Atherosclerotic heart disease of native coronary artery without angina pectoris: Secondary | ICD-10-CM | POA: Diagnosis not present

## 2023-02-27 DIAGNOSIS — I739 Peripheral vascular disease, unspecified: Secondary | ICD-10-CM | POA: Diagnosis not present

## 2023-02-27 DIAGNOSIS — I119 Hypertensive heart disease without heart failure: Secondary | ICD-10-CM | POA: Diagnosis not present

## 2023-02-27 DIAGNOSIS — Z9981 Dependence on supplemental oxygen: Secondary | ICD-10-CM | POA: Diagnosis not present

## 2023-02-27 DIAGNOSIS — Z48812 Encounter for surgical aftercare following surgery on the circulatory system: Secondary | ICD-10-CM | POA: Diagnosis not present

## 2023-02-27 DIAGNOSIS — I2510 Atherosclerotic heart disease of native coronary artery without angina pectoris: Secondary | ICD-10-CM | POA: Diagnosis not present

## 2023-02-27 DIAGNOSIS — J449 Chronic obstructive pulmonary disease, unspecified: Secondary | ICD-10-CM | POA: Diagnosis not present

## 2023-03-03 ENCOUNTER — Ambulatory Visit (INDEPENDENT_AMBULATORY_CARE_PROVIDER_SITE_OTHER): Payer: Medicare Other

## 2023-03-03 ENCOUNTER — Telehealth: Payer: Self-pay

## 2023-03-03 DIAGNOSIS — I97638 Postprocedural hematoma of a circulatory system organ or structure following other circulatory system procedure: Secondary | ICD-10-CM

## 2023-03-03 DIAGNOSIS — I729 Aneurysm of unspecified site: Secondary | ICD-10-CM

## 2023-03-03 DIAGNOSIS — T81718A Complication of other artery following a procedure, not elsewhere classified, initial encounter: Secondary | ICD-10-CM

## 2023-03-03 NOTE — Telephone Encounter (Signed)
Caller: Pt's wife, Chase Lloyd  Concern: enlarging swelling surrounding incision at groin that feels firm under skin  Location: right leg  Description: gradual, worsening x 2 days  Aggravating Factors: walking causes soreness and a cramping sensation on the medial aspect of the R thigh near the groin x 2 days  Quality: tight (pulling) and uncomfortable  Treatments:  Elevation  Procedure: RIGHT ILIO-FEMORAL ENDARTERECTOMY WITH PATCH ANGIOPLASTY   Resolution: Appointment scheduled for groin pseudoaneurysm  Next Appt: Keep current post-op appt on 5/17, unless [name] is +

## 2023-03-04 ENCOUNTER — Telehealth: Payer: Self-pay

## 2023-03-04 NOTE — Telephone Encounter (Signed)
Called pt, two identifiers used. Relayed Dr. Verita Lamb advice to keep appt on 5/14. Instructed him to call if anything worsened. Confirmed understanding.

## 2023-03-04 NOTE — Telephone Encounter (Signed)
-----   Message from [name], MD sent at 03/04/2023 10:44 AM EDT ----- Regarding: RE: [name] Results OK - if he's scheduled to follow up in next 1-2 weeks, let's keep his scheduled appointment. If not let's get him in to see me in that time frame.   Thanks  Chase Lloyd  ----- Message ----- From: [name], RN Sent: 03/04/2023   8:59 AM EDT To: [name], MD Subject: [name] Results                                     Dr. [name],  Pt called yesterday and based on symptoms, I had him get a groin pseudo check at [facility]. I routed the results to you. The tech also sent you an email, but I copied and pasted below because I wasn't sure how you checked that.  "Dr. [name]-  For [name], there appears to be a resolved pseudoaneurysm in the right common femoral artery area measuring 3.55 cm x 2.35 cm. And another anechoic area measuring 0.918 cm x 1.77 cm. I could not detect any flow into these areas. Above this common femoral area is an estimated 2.81 cm x 3.78 cm echo filled area, resembling a hematoma.  I have let the patient leave and told him your office would follow up.  Thank you  Jeryl Columbia, RDCS, RVT"  Do you need the pt to come into office any earlier since it seems to be resolving? A phone call to reassure him? He has a post-op with you and an ABI on 5/14. Thanks, Lawanna Kobus, RN - Triage

## 2023-03-10 ENCOUNTER — Encounter: Payer: Self-pay | Admitting: Cardiovascular Disease

## 2023-03-10 ENCOUNTER — Other Ambulatory Visit: Payer: Self-pay | Admitting: *Deleted

## 2023-03-10 ENCOUNTER — Ambulatory Visit: Payer: Medicare Other | Attending: Cardiovascular Disease | Admitting: Cardiovascular Disease

## 2023-03-10 VITALS — BP 144/60 | HR 86 | Ht 71.0 in | Wt 169.6 lb

## 2023-03-10 DIAGNOSIS — I2510 Atherosclerotic heart disease of native coronary artery without angina pectoris: Secondary | ICD-10-CM | POA: Insufficient documentation

## 2023-03-10 DIAGNOSIS — R000 Tachycardia, unspecified: Secondary | ICD-10-CM | POA: Insufficient documentation

## 2023-03-10 DIAGNOSIS — I739 Peripheral vascular disease, unspecified: Secondary | ICD-10-CM | POA: Insufficient documentation

## 2023-03-10 DIAGNOSIS — I5032 Chronic diastolic (congestive) heart failure: Secondary | ICD-10-CM | POA: Insufficient documentation

## 2023-03-10 DIAGNOSIS — E785 Hyperlipidemia, unspecified: Secondary | ICD-10-CM | POA: Insufficient documentation

## 2023-03-10 DIAGNOSIS — I729 Aneurysm of unspecified site: Secondary | ICD-10-CM

## 2023-03-10 DIAGNOSIS — I70221 Atherosclerosis of native arteries of extremities with rest pain, right leg: Secondary | ICD-10-CM

## 2023-03-10 NOTE — Patient Instructions (Signed)
Medication Instructions:  No changes *If you need a refill on your cardiac medications before your next appointment, please call your pharmacy*   Lab Work: None ordered If you have labs (blood work) drawn today and your tests are completely normal, you will receive your results only by: MyChart Message (if you have MyChart) OR A paper copy in the mail If you have any lab test that is abnormal or we need to change your treatment, we will call you to review the results.   Testing/Procedures: None ordered   Follow-Up: At [facility], you and your health needs are our priority.  As part of our continuing mission to provide you with exceptional heart care, we have created designated Provider Care Teams.  These Care Teams include your primary Cardiologist (physician) and Advanced Practice Providers (APPs -  Physician Assistants and Nurse Practitioners) who all work together to provide you with the care you need, when you need it.  We recommend signing up for the patient portal called "MyChart".  Sign up information is provided on this After Visit Summary.  MyChart is used to connect with patients for Virtual Visits (Telemedicine).  Patients are able to view lab/test results, encounter notes, upcoming appointments, etc.  Non-urgent messages can be sent to your provider as well.   To learn more about what you can do with MyChart, go to [email or web address].    Your next appointment:   4 month(s)  Provider:   Lorine Bears, MD

## 2023-03-10 NOTE — Progress Notes (Unsigned)
Cardiology Office Note   Date:  03/11/2023   ID:  Chase Lloyd, DOB Sep 23, 1956, MRN [id number]  PCP:  Shirline Frees, NP  Cardiologist:  Dr. [name]  No chief complaint on file.     History of Present Illness: Chase Lloyd is a 67 y.o. male who is here today for follow-up visit regarding peripheral arterial disease.   He has known history of coronary artery disease with previous cardiac catheterization showing severely calcified coronary arteries with mild to moderate nonobstructive disease, chronic diastolic heart failure, moderate aortic insufficiency, essential hypertension, hyperlipidemia, previous back surgery, severe COPD on home oxygen and Hodgkin's disease. He was seen recently for severe bilateral claudication and rest pain on the right side. He underwent recent noninvasive vascular testing which showed an ABI of 0.26 on the right and 0.60 on the left. Duplex showed severe stenosis in the left external iliac artery with moderate stenosis in the left SFA.  On the right side, the common femoral artery was occluded as well as the SFA.  He underwent angiography on April 3 which showed on the right: Moderate external iliac artery stenosis, occluded common femoral artery and occluded proximal SFA with reconstitution distally via collaterals from the profunda and two-vessel runoff below the knee. On the left, there was severe stenosis affecting the left external iliac artery, moderate common femoral artery stenosis, moderate SFA disease and three-vessel runoff below the knee. I performed successful self-expanding stent placement to the left external iliac artery with a second stent added for distal edge dissection. He underwent staged right iliofemoral endarterectomy with bovine pericardial patch angioplasty on April 10 by Dr. [name].  He reports resolution of claudication but he is concerned about right groin hematoma which was noted on ultrasound recently.  He feels that it became  a little bit bigger over the last week.  Past Medical History:  Diagnosis Date   Arthritis    Back pain    COPD (chronic obstructive pulmonary disease) (HCC)    Coronary artery disease    COVID    mild case   DDD (degenerative disc disease), lumbar    Depression    Dyspnea    uses O2 as needed   Emphysema of lung (HCC)    Empyema (HCC)    2016  (was in [place])   GERD (gastroesophageal reflux disease)    Hearing loss of right ear    started 2001.     microphone in right ear ,  and hearing aid in left   Hodgkin's disease (HCC) 1999   Hypotension    Meniere's disease    PAD (peripheral artery disease) (HCC)    Pneumonia    Tinnitus     Past Surgical History:  Procedure Laterality Date   ABDOMINAL AORTOGRAM W/LOWER EXTREMITY Bilateral 02/04/2023   Procedure: ABDOMINAL AORTOGRAM W/LOWER EXTREMITY;  Surgeon: Iran Ouch, MD;  Location: [facility];  Service: Cardiovascular;  Laterality: Bilateral;   BACK SURGERY     BIOPSY  01/15/2023   Procedure: BIOPSY;  Surgeon: Imogene Burn, MD;  Location: [facility];  Service: Gastroenterology;;   CERVICAL FUSION  2016   COLONOSCOPY WITH PROPOFOL N/A 01/15/2023   Procedure: COLONOSCOPY WITH PROPOFOL;  Surgeon: Imogene Burn, MD;  Location: [facility];  Service: Gastroenterology;  Laterality: N/A;   ELBOW SURGERY     ENDARTERECTOMY FEMORAL Right 02/11/2023   Procedure: RIGHT ILIO-FEMORAL ENDARTERECTOMY WITH PATCH ANGIOPLASTY;  Surgeon: [name], MD;  Location: [facility]  OR;  Service: Vascular;  Laterality: Right;   ESOPHAGOGASTRODUODENOSCOPY (EGD) WITH PROPOFOL N/A 01/15/2023   Procedure: ESOPHAGOGASTRODUODENOSCOPY (EGD) WITH PROPOFOL;  Surgeon: Imogene Burn, MD;  Location: [facility];  Service: Gastroenterology;  Laterality: N/A;   EYE SURGERY     IR THORACENTESIS ASP PLEURAL SPACE W/IMG GUIDE  10/08/2018   LAMINECTOMY  1999   lower back surgery     PERIPHERAL VASCULAR INTERVENTION Left 02/04/2023   Procedure:  PERIPHERAL VASCULAR INTERVENTION;  Surgeon: Iran Ouch, MD;  Location: [facility];  Service: Cardiovascular;  Laterality: Left;  left external iliac   POLYPECTOMY  01/15/2023   Procedure: POLYPECTOMY;  Surgeon: Imogene Burn, MD;  Location: [facility];  Service: Gastroenterology;;   RIGHT/LEFT HEART CATH AND CORONARY ANGIOGRAPHY N/A 04/29/2019   Procedure: RIGHT/LEFT HEART CATH AND CORONARY ANGIOGRAPHY;  Surgeon: Lennette Bihari, MD;  Location: [facility];  Service: Cardiovascular;  Laterality: N/A;   TONSILLECTOMY       Current Outpatient Medications  Medication Sig Dispense Refill   acetaminophen (TYLENOL) 500 MG tablet Take 1,000 mg by mouth every 6 (six) hours as needed for mild pain.     albuterol (PROVENTIL) (2.5 MG/3ML) 0.083% nebulizer solution Take 3 mLs (2.5 mg total) by nebulization every 4 (four) hours as needed for wheezing or shortness of breath. 75 mL 12   albuterol (VENTOLIN HFA) 108 (90 Base) MCG/ACT inhaler Inhale 2 puffs into the lungs every 6 (six) hours as needed for wheezing or shortness of breath. 8 g 6   aspirin EC 81 MG tablet Take 1 tablet (81 mg total) by mouth daily. Swallow whole. 150 tablet 2   Budeson-Glycopyrrol-Formoterol (BREZTRI AEROSPHERE) 160-9-4.8 MCG/ACT AERO Inhale 2 puffs into the lungs 2 (two) times daily. (Patient taking differently: Inhale 2 puffs into the lungs in the morning.) 3 each 3   clopidogrel (PLAVIX) 75 MG tablet Take 1 tablet (75 mg total) by mouth daily with breakfast. 90 tablet 3   isosorbide mononitrate (IMDUR) 30 MG 24 hr tablet Take 30 mg by mouth in the morning.     levofloxacin (LEVAQUIN) 750 MG tablet Take 1 tablet (750 mg total) by mouth daily. 3 tablet 0   metoprolol succinate (TOPROL-XL) 25 MG 24 hr tablet Take 1 tablet (25 mg total) by mouth daily. 90 tablet 3   pantoprazole (PROTONIX) 40 MG tablet Take 1 tablet (40 mg total) by mouth daily. 90 tablet 3   predniSONE (DELTASONE) 20 MG tablet Take 1 tablet  (20 mg total) by mouth daily with breakfast. 30 tablet 0   rosuvastatin (CRESTOR) 40 MG tablet Take 1 tablet (40 mg total) by mouth daily. 90 tablet 3   No current facility-administered medications for this visit.    Allergies:   Patient has no known allergies.    Social History:  The patient  reports that he quit smoking about 4 years ago. His smoking use included cigarettes. He has a 70.50 pack-year smoking history. He has never used smokeless tobacco. He reports current alcohol use. He reports that he does not use drugs.   Family History:  The patient's family history includes Diabetes in his father; Hypertension in his father; Melanoma in his brother; Testicular cancer in his brother.    ROS:  Please see the history of present illness.   Otherwise, review of systems are positive for none.   All other systems are reviewed and negative.    PHYSICAL EXAM: VS:  BP (!) 144/60  Pulse 86   Ht 5\' 11"  (1.803 m)   Wt 169 lb 9.6 oz (76.9 kg)   SpO2 91%   BMI 23.65 kg/m  , BMI Body mass index is 23.65 kg/m. GEN: Well nourished, well developed, in no acute distress  HEENT: normal  Neck: no JVD, carotid bruits, or masses Cardiac: RRR; no rubs, or gallops,no edema .  1 out of 6 systolic murmur in the aortic area Respiratory:  clear to auscultation bilaterally, normal work of breathing GI: soft, nontender, nondistended, + BS MS: no deformity or atrophy  Skin: warm and dry, no rash Neuro:  Strength and sensation are intact Psych: euthymic mood, full affect Femoral: Left femoral pulses normal.  Difficult to palpate right femoral pulse due to hematoma that is medium in size.  Mild tenderness.   EKG:  EKG is not ordered today.    Recent Labs: 12/31/2022: TSH 7.880 02/11/2023: ALT 21 02/12/2023: BUN 9; Creatinine, Ser 1.24; Hemoglobin 8.1; Platelets 119; Potassium 3.8; Sodium 138    Lipid Panel    Component Value Date/Time   CHOL 99 02/12/2023 0440   CHOL 136 07/01/2019 0741   TRIG  67 02/12/2023 0440   HDL 45 02/12/2023 0440   HDL 47 07/01/2019 0741   CHOLHDL 2.2 02/12/2023 0440   VLDL 13 02/12/2023 0440   LDLCALC 41 02/12/2023 0440   LDLCALC 66 07/01/2019 0741   LDLDIRECT 49 12/31/2022 1512      Wt Readings from Last 3 Encounters:  03/10/23 169 lb 9.6 oz (76.9 kg)  02/20/23 172 lb (78 kg)  02/11/23 170 lb (77.1 kg)          01/06/2019    2:29 PM  PAD Screen  Previous PAD dx? No  Previous surgical procedure? No  Pain with walking? No  Feet/toe relief with dangling? No  Painful, non-healing ulcers? No  Extremities discolored? No      ASSESSMENT AND PLAN:  1.  Peripheral arterial disease: He is doing well post left external iliac artery stenting and right common femoral artery endarterectomy.  His right SFA is known to be occluded but he reports resolution of claudication and rest pain.   He is concerned about the hematoma in the right groin which have expanded slightly over the last week according to him.  It seems to be small to medium in size today by exam.  He has follow-up appointment with vascular surgery next week.  I doubt that this will require any intervention.  I will reach out to Dr. [name] to see if there is utility of stopping clopidogrel.  2.  Coronary artery disease involving native coronary arteries without angina: No anginal symptoms.  Continue medical therapy.  3.  Tachycardia: Controlled with Toprol.  4.  Chronic diastolic heart failure: He appears to be euvolemic on small dose furosemide.  5.  Hyperlipidemia: Continue rosuvastatin with a target LDL of less than 70.    Disposition: Follow-up in 4 months.  Signed,  Lorine Bears, MD  03/11/2023 11:13 AM    [facility]

## 2023-03-16 NOTE — Progress Notes (Unsigned)
[facility] OF [place]  ASSESSMENT / PLAN: [name] is a 67 y.o. male status post right iliofemoral endarterectomy with bovine pericardial patch angioplasty 02/11/23  Recommend the following which can slow the progression of atherosclerosis and reduce the risk of major adverse cardiac / limb events:  Complete cessation from all tobacco products. Blood glucose control with goal A1c < 7%. Blood pressure control with goal blood pressure < 140/90 mmHg. Lipid reduction therapy with goal LDL-C <100 mg/dL (<40 if symptomatic from PAD).  Aspirin 81mg  PO QD.  Atorvastatin 40-80mg  PO QD (or other "high intensity" statin therapy).  To discontinue clopidogrel at this time.  We will see him again in 6 months with ABI for surveillance of peripheral arterial disease.  CHIEF COMPLAINT: Postop visit  HISTORY OF PRESENT ILLNESS: Chase Lloyd is a 67 y.o. male with pertinent medical history of HLD, HTN, COPD on home O2, CHF, CAD, and Peripheral artery disease with lifestyle limiting claudication of his right lower extremity. He explains that for several years he has had worsening pain/ tightness in his legs on ambulation. He walks with rolling walker and explains that at about 5 min of walking or less he has to stop and rest on his walking to alleviate his calf discomfort. It will resolve after prolonged rest and occurs again on continued ambulation. He does not really describe rest pain however he does say 2-3 nights a week he gets cramping in his leg from thigh down. He does not have any wounds. He does describe coldness in his right foot in particular and some decreased sensation compared to left leg. He has known ABI of  0.26 on the right and 0.60 on the left. Yesterday he was admitted and underwent Abdominal aortogram with lower extremity runoff by Dr. Kirke Corin yesterday on 02/04/23. On the right lower extremity he was found to have moderate external iliac artery stenosis, occluded right  common femoral artery, occluded SFA with distal reconstitution via collaterals from profunda and two vessel runoff. His left lower extremity disease included external iliac artery stenosis, moderate common femoral artery stenosis and moderate SFA stenosis with three vessel runoff. His left lower extremity was amenable to endovascular treatment with external iliac artery angioplasty and stenting. However, due to his occluded right common femoral artery vascular surgery was consulted for surgical management. He has been started on dual antiplatelet therapy. He also is managed on a statin. He is former smoker.   03/17/23: Patient returns to clinic after iliofemoral endarterectomy.  He had good technical result.  He has complete symptom resolution.  He has no cramping discomfort at night waking him up from sleep.  He reports no pain with walking.  Unfortunately, he did develop a hematoma postoperatively.  This is slowly resolving.   VASCULAR SURGICAL HISTORY:  right iliofemoral endarterectomy with bovine pericardial patch angioplasty 02/11/23  Past Medical History:  Diagnosis Date   Arthritis    Back pain    COPD (chronic obstructive pulmonary disease) (HCC)    Coronary artery disease    COVID    mild case   DDD (degenerative disc disease), lumbar    Depression    Dyspnea    uses O2 as needed   Emphysema of lung (HCC)    Empyema (HCC)    2016  (was in [place])   GERD (gastroesophageal reflux disease)    Hearing loss of right ear    started 2001.     microphone in right ear ,  and hearing aid in left   Hodgkin's disease (HCC) 1999   Hypotension    Meniere's disease    PAD (peripheral artery disease) (HCC)    Pneumonia    Tinnitus     Past Surgical History:  Procedure Laterality Date   ABDOMINAL AORTOGRAM W/LOWER EXTREMITY Bilateral 02/04/2023   Procedure: ABDOMINAL AORTOGRAM W/LOWER EXTREMITY;  Surgeon: Iran Ouch, MD;  Location: [facility];  Service: Cardiovascular;   Laterality: Bilateral;   BACK SURGERY     BIOPSY  01/15/2023   Procedure: BIOPSY;  Surgeon: Imogene Burn, MD;  Location: [facility];  Service: Gastroenterology;;   CERVICAL FUSION  2016   COLONOSCOPY WITH PROPOFOL N/A 01/15/2023   Procedure: COLONOSCOPY WITH PROPOFOL;  Surgeon: Imogene Burn, MD;  Location: [facility];  Service: Gastroenterology;  Laterality: N/A;   ELBOW SURGERY     ENDARTERECTOMY FEMORAL Right 02/11/2023   Procedure: RIGHT ILIO-FEMORAL ENDARTERECTOMY WITH PATCH ANGIOPLASTY;  Surgeon: [name], MD;  Location: [facility] OR;  Service: Vascular;  Laterality: Right;   ESOPHAGOGASTRODUODENOSCOPY (EGD) WITH PROPOFOL N/A 01/15/2023   Procedure: ESOPHAGOGASTRODUODENOSCOPY (EGD) WITH PROPOFOL;  Surgeon: Imogene Burn, MD;  Location: [facility];  Service: Gastroenterology;  Laterality: N/A;   EYE SURGERY     IR THORACENTESIS ASP PLEURAL SPACE W/IMG GUIDE  10/08/2018   LAMINECTOMY  1999   lower back surgery     PERIPHERAL VASCULAR INTERVENTION Left 02/04/2023   Procedure: PERIPHERAL VASCULAR INTERVENTION;  Surgeon: Iran Ouch, MD;  Location: [facility];  Service: Cardiovascular;  Laterality: Left;  left external iliac   POLYPECTOMY  01/15/2023   Procedure: POLYPECTOMY;  Surgeon: Imogene Burn, MD;  Location: [facility];  Service: Gastroenterology;;   RIGHT/LEFT HEART CATH AND CORONARY ANGIOGRAPHY N/A 04/29/2019   Procedure: RIGHT/LEFT HEART CATH AND CORONARY ANGIOGRAPHY;  Surgeon: Lennette Bihari, MD;  Location: [facility];  Service: Cardiovascular;  Laterality: N/A;   TONSILLECTOMY      Family History  Problem Relation Age of Onset   Hypertension Father    Diabetes Father    Testicular cancer Brother    Melanoma Brother     Social History   Socioeconomic History   Marital status: Married    Spouse name: Not on file   Number of children: Not on file   Years of education: Not on file   Highest education level: Not on file  Occupational  History   Occupation: Retired  Tobacco Use   Smoking status: Former    Packs/day: 1.50    Years: 47.00    Additional pack years: 0.00    Total pack years: 70.50    Types: Cigarettes    Quit date: 06/13/2018    Years since quitting: 4.7   Smokeless tobacco: Never  Vaping Use   Vaping Use: Never used  Substance and Sexual Activity   Alcohol use: Yes    Comment: Occasional   Drug use: No   Sexual activity: Not on file  Other Topics Concern   Not on file  Social History Narrative   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       Social Determinants of Corporate investment banker Strain: Low Risk  (11/13/2022)   Overall Financial Resource Strain (CARDIA)    Difficulty of Paying Living Expenses: Not hard at all  Food Insecurity: No Food Insecurity (02/11/2023)   Hunger Vital Sign    Worried About Running Out of Food in the Last Year: Never  true    Ran Out of Food in the Last Year: Never true  Transportation Needs: No Transportation Needs (02/11/2023)   PRAPARE - Administrator, Civil Service (Medical): No    Lack of Transportation (Non-Medical): No  Physical Activity: Insufficiently Active (11/13/2022)   Exercise Vital Sign    Days of Exercise per Week: 2 days    Minutes of Exercise per Session: 60 min  Stress: No Stress Concern Present (11/13/2022)   [employer] of Occupational Health - Occupational Stress Questionnaire    Feeling of Stress : Not at all  Social Connections: Moderately Isolated (11/13/2022)   Social Connection and Isolation Panel [NHANES]    Frequency of Communication with Friends and Family: More than three times a week    Frequency of Social Gatherings with Friends and Family: More than three times a week    Attends Religious Services: Never    Database administrator or Organizations: No    Attends Banker Meetings: Never    Marital Status: Married  Catering manager Violence: Not At Risk (02/11/2023)   Humiliation, Afraid, Rape, and Kick  questionnaire    Fear of Current or Ex-Partner: No    Emotionally Abused: No    Physically Abused: No    Sexually Abused: No    No Known Allergies  Current Outpatient Medications  Medication Sig Dispense Refill   acetaminophen (TYLENOL) 500 MG tablet Take 1,000 mg by mouth every 6 (six) hours as needed for mild pain.     albuterol (PROVENTIL) (2.5 MG/3ML) 0.083% nebulizer solution Take 3 mLs (2.5 mg total) by nebulization every 4 (four) hours as needed for wheezing or shortness of breath. 75 mL 12   albuterol (VENTOLIN HFA) 108 (90 Base) MCG/ACT inhaler Inhale 2 puffs into the lungs every 6 (six) hours as needed for wheezing or shortness of breath. 8 g 6   aspirin EC 81 MG tablet Take 1 tablet (81 mg total) by mouth daily. Swallow whole. 150 tablet 2   Budeson-Glycopyrrol-Formoterol (BREZTRI AEROSPHERE) 160-9-4.8 MCG/ACT AERO Inhale 2 puffs into the lungs 2 (two) times daily. (Patient taking differently: Inhale 2 puffs into the lungs in the morning.) 3 each 3   clopidogrel (PLAVIX) 75 MG tablet Take 1 tablet (75 mg total) by mouth daily with breakfast. 90 tablet 3   isosorbide mononitrate (IMDUR) 30 MG 24 hr tablet Take 30 mg by mouth in the morning.     metoprolol succinate (TOPROL-XL) 25 MG 24 hr tablet Take 1 tablet (25 mg total) by mouth daily. 90 tablet 3   pantoprazole (PROTONIX) 40 MG tablet Take 1 tablet (40 mg total) by mouth daily. 90 tablet 3   rosuvastatin (CRESTOR) 40 MG tablet Take 1 tablet (40 mg total) by mouth daily. 90 tablet 3   predniSONE (DELTASONE) 20 MG tablet Take 1 tablet (20 mg total) by mouth daily with breakfast. (Patient not taking: Reported on 03/17/2023) 30 tablet 0   No current facility-administered medications for this visit.    PHYSICAL EXAM Vitals:   03/17/23 1508  BP: (!) 132/55  Pulse: 81  Resp: 20  Temp: 98.8 F (37.1 C)  SpO2: 93%  Weight: 169 lb (76.7 kg)  Height: 5\' 11"  (1.803 m)    Thin elderly man in no distress Regular rate and  rhythm Breathing somewhat labored with nasal cannula Right groin incision well-healed.  A smaller than golf size ball hematoma is noted in the groin.  No signs of infection.  PERTINENT LABORATORY AND RADIOLOGIC DATA  Most recent CBC    Latest Ref Rng & Units 02/12/2023    4:40 AM 02/11/2023    1:30 PM 02/11/2023    9:32 AM  CBC  WBC 4.0 - 10.5 K/uL 5.2   4.3   Hemoglobin 13.0 - 17.0 g/dL 8.1  7.1  40.9   Hematocrit 39.0 - 52.0 % 25.4  21.0  31.4   Platelets 150 - 400 K/uL 119   116      Most recent CMP    Latest Ref Rng & Units 02/12/2023    4:40 AM 02/11/2023    1:30 PM 02/11/2023    9:32 AM  CMP  Glucose 70 - 99 mg/dL 811   97   BUN 8 - 23 mg/dL 9   8   Creatinine 9.14 - 1.24 mg/dL 7.82   9.56   Sodium 213 - 145 mmol/L 138  142  140   Potassium 3.5 - 5.1 mmol/L 3.8  2.9  3.1   Chloride 98 - 111 mmol/L 102   103   CO2 22 - 32 mmol/L 25   28   Calcium 8.9 - 10.3 mg/dL 7.7   8.4   Total Protein 6.5 - 8.1 g/dL   6.9   Total Bilirubin 0.3 - 1.2 mg/dL   1.2   Alkaline Phos 38 - 126 U/L   56   AST 15 - 41 U/L   32   ALT 0 - 44 U/L   21     Renal function CrCl cannot be calculated (Patient's most recent lab result is older than the maximum 21 days allowed.).  No results found for: "HGBA1C"  LDL Calculated  Date Value Ref Range Status  07/01/2019 66 0 - 99 mg/dL Final    Comment:    **Effective July 04, 2019, [facility] is implementing an improved** equation to calculate Low Density Lipoprotein Cholesterol (LDL-C) concentrations, to be used in all lipid panels that report calculated LDL-C. This equation was developed through a collaboration with the [employer], Lung and Blood Institutes of Health (NIH).[1] The NIH calculation overcomes the limitations of the existing Friedewald LDL-C equation and performs equally well in both fasting and non-fasting individuals. 1. Rebbeca Paul Q, et al. A new equation for calculation of low-density lipoprotein cholesterol  in patients with normolipidemia and/or hypertriglyceridemia. JAMA Cardiol. 2020 Feb 26. doi:10.1001/jamacardio.2020.0013    LDL Cholesterol  Date Value Ref Range Status  02/12/2023 41 0 - 99 mg/dL Final    Comment:           Total Cholesterol/HDL:CHD Risk Coronary Heart Disease Risk Table                     Men   Women  1/2 Average Risk   3.4   3.3  Average Risk       5.0   4.4  2 X Average Risk   9.6   7.1  3 X Average Risk  23.4   11.0        Use the calculated Patient Ratio above and the CHD Risk Table to determine the patient's CHD Risk.        ATP III CLASSIFICATION (LDL):  <100     mg/dL   Optimal  086-578  mg/dL   Near or Above                    Optimal  130-159  mg/dL  Borderline  160-189  mg/dL   High  >161     mg/dL   Very High Performed at [facility], [address], [place], [place] [address]    LDL Direct  Date Value Ref Range Status  12/31/2022 49 0 - 99 mg/dL Final     +-------+-----------+-----------+------------+------------+  ABI/TBIToday's ABIToday's TBIPrevious ABIPrevious TBI  +-------+-----------+-----------+------------+------------+  Right 0.58       0.27       0.26        0.29          +-------+-----------+-----------+------------+------------+  Left  0.93       0.54       0.60        0.19          +-------+-----------+-----------+------------+------------+     Rande Brunt. [name], MD [facility] Phone Number: [phone] 03/17/2023 3:52 PM   Total time spent on preparing this encounter including chart review, data review, collecting history, examining the patient, coordinating care for this established patient, 20 minutes.  Portions of this report may have been transcribed using voice recognition software.  Every effort has been made to ensure accuracy; however, inadvertent computerized transcription errors may still be present.

## 2023-03-17 ENCOUNTER — Encounter: Payer: Self-pay | Admitting: Vascular Surgery

## 2023-03-17 ENCOUNTER — Ambulatory Visit (INDEPENDENT_AMBULATORY_CARE_PROVIDER_SITE_OTHER): Payer: Medicare Other | Admitting: Vascular Surgery

## 2023-03-17 ENCOUNTER — Ambulatory Visit (HOSPITAL_COMMUNITY)
Admission: RE | Admit: 2023-03-17 | Discharge: 2023-03-17 | Disposition: A | Discharge status: Home / Self Care | Payer: Medicare Other | Source: Ambulatory Visit | Attending: Vascular Surgery | Admitting: Vascular Surgery

## 2023-03-17 VITALS — BP 132/55 | HR 81 | Temp 98.8°F | Resp 20 | Ht 71.0 in | Wt 169.0 lb

## 2023-03-17 DIAGNOSIS — T81718A Complication of other artery following a procedure, not elsewhere classified, initial encounter: Secondary | ICD-10-CM | POA: Diagnosis not present

## 2023-03-17 DIAGNOSIS — I729 Aneurysm of unspecified site: Secondary | ICD-10-CM | POA: Insufficient documentation

## 2023-03-17 DIAGNOSIS — I70221 Atherosclerosis of native arteries of extremities with rest pain, right leg: Secondary | ICD-10-CM | POA: Insufficient documentation

## 2023-03-17 DIAGNOSIS — I739 Peripheral vascular disease, unspecified: Secondary | ICD-10-CM

## 2023-03-17 LAB — VAS US ABI WITH/WO TBI
Left ABI: 0.93
Right ABI: 0.58

## 2023-03-25 ENCOUNTER — Ambulatory Visit (HOSPITAL_BASED_OUTPATIENT_CLINIC_OR_DEPARTMENT_OTHER): Payer: Medicare Other | Admitting: Pulmonary Disease

## 2023-03-26 ENCOUNTER — Other Ambulatory Visit: Payer: Self-pay

## 2023-03-26 DIAGNOSIS — I739 Peripheral vascular disease, unspecified: Secondary | ICD-10-CM

## 2023-03-31 ENCOUNTER — Telehealth: Payer: Self-pay | Admitting: Pulmonary Disease

## 2023-03-31 NOTE — Telephone Encounter (Signed)
Pt wife calling bc her husband is now set up for evaluation for a  lung transplant at [facility] on 06/10-06/11, & they wants to know if they need to make another appt with Dr. Everardo All, Please Advise.

## 2023-04-01 NOTE — Telephone Encounter (Signed)
Would recommend for patient to continue local care with me until [facility] confirms his transplant eligibility.  Please schedule with me for August 2024

## 2023-04-13 DIAGNOSIS — I739 Peripheral vascular disease, unspecified: Secondary | ICD-10-CM | POA: Diagnosis not present

## 2023-04-13 DIAGNOSIS — I2510 Atherosclerotic heart disease of native coronary artery without angina pectoris: Secondary | ICD-10-CM | POA: Diagnosis not present

## 2023-04-13 DIAGNOSIS — J90 Pleural effusion, not elsewhere classified: Secondary | ICD-10-CM | POA: Diagnosis not present

## 2023-04-13 DIAGNOSIS — R0609 Other forms of dyspnea: Secondary | ICD-10-CM | POA: Diagnosis not present

## 2023-04-13 DIAGNOSIS — Z7682 Awaiting organ transplant status: Secondary | ICD-10-CM | POA: Diagnosis not present

## 2023-04-13 DIAGNOSIS — J4489 Other specified chronic obstructive pulmonary disease: Secondary | ICD-10-CM | POA: Diagnosis not present

## 2023-04-13 DIAGNOSIS — J9611 Chronic respiratory failure with hypoxia: Secondary | ICD-10-CM | POA: Diagnosis not present

## 2023-04-13 DIAGNOSIS — Z01818 Encounter for other preprocedural examination: Secondary | ICD-10-CM | POA: Diagnosis not present

## 2023-04-13 DIAGNOSIS — I351 Nonrheumatic aortic (valve) insufficiency: Secondary | ICD-10-CM | POA: Diagnosis not present

## 2023-04-13 DIAGNOSIS — R0602 Shortness of breath: Secondary | ICD-10-CM | POA: Diagnosis not present

## 2023-04-13 DIAGNOSIS — J439 Emphysema, unspecified: Secondary | ICD-10-CM | POA: Diagnosis not present

## 2023-04-13 DIAGNOSIS — Z87891 Personal history of nicotine dependence: Secondary | ICD-10-CM | POA: Diagnosis not present

## 2023-05-28 NOTE — Progress Notes (Signed)
Cardiology Office Note:  .   Date:  05/29/2023  ID:  Chase Lloyd, DOB Jul 16, 1956, MRN [id number] PCP: Chase Frees, NP  [facility] Providers Cardiologist:  Chase Bears, MD {  History of Present Illness: .   Chase Lloyd is a 67 y.o. male with past medical history of peripheral arterial disease followed by Dr. Kirke Corin and known coronary artery disease with previous cardiac catheterization showing severely calcified coronary arteries with mild to moderate nonobstructive disease, chronic diastolic heart failure, moderate aortic insufficiency, essential hypertension, hyperlipidemia, previous back surgery, severe COPD on home oxygen and Hodgkin's disease seen in follow-up appointment today.  Patient was seen for severe bilateral claudication and rest pain on the right side.  Underwent recent noninvasive vascular testing that showed ABI of 0.26 on the right and 0.60 on the left.  Duplex showed severe stenosis in the left external iliac artery and moderate stenosis in the left SFA.  On the right side, common femoral artery was occluded as well as the SFA.  Underwent angiography on February 04, 2023 which showed right moderate external iliac artery stenosis and occluded common femoral artery and occluded proximal SFA with reconstitution distally via collaterals from the profunda and two-vessel runoff below the knee.  On the left, there was severe stenosis affecting the left external iliac artery, moderate common femoral artery stenosis, moderate SFA disease and three-vessel runoff below the knee.  Dr. Kary Lloyd performed successful self-expanding stent placement to the left external iliac artery with a second stent added for distal edge dissection.  Underwent staged right iliofemoral endarterectomy with bovine pericardial patch angioplasty on April 10 by Dr. Lenell Lloyd.  Reports resolution of claudication but he still was concerned about the right groin hematoma which was noted on ultrasound.  Patient  felt it was a little bit bigger last week.   Today, he tells me he has had a hard time breathing recently.  Is on chronic oxygen anywhere from 2 to 4 L depending on what he is doing.  Severe chronic COPD.  Currently undergoing workup at [facility] for lung transplant.  Oxygen saturation is 85% today and asked him to turn up his oxygen.  Also suggested obtaining a BP cuff since his diastolic blood pressure was in the 40s today.  Had some lightheadedness and the thought was may be his saturation was too low.  He does have a oxygen saturation finger probe at home to check it regularly.  No fevers but night sweats recently about 2 weeks ago.  Also had some chills.  Was anemic on last labs but white count was normal (June 2024 through [facility]).  He had a fall recently where his legs went out from underneath him.  Has not done any exercises since he got denied for lung transplant.  Was supposed to get enrolled with pulmonary rehab.  I offered to order physical therapy but patient denied at this time.  Reports no chest pain, pressure, or tightness. No edema, orthopnea, PND. Reports no palpitations.   ROS: Pertinent ROS in HPI  Studies Reviewed: .       Echocardiogram 11/17/2022 IMPRESSIONS     1. Left ventricular ejection fraction, by estimation, is 55 to 60%. The  left ventricle has normal function. The left ventricle has no regional  wall motion abnormalities. Left ventricular diastolic parameters are  consistent with Grade I diastolic  dysfunction (impaired relaxation).   2. Right ventricular systolic function is normal. The right ventricular  size is normal. Tricuspid regurgitation signal  is inadequate for assessing  PA pressure.   3. The mitral valve is normal in structure. Mild mitral valve  regurgitation. No evidence of mitral stenosis.   4. The aortic valve is tricuspid. There is mild calcification of the  aortic valve. Aortic valve regurgitation is moderate. No aortic stenosis  is present.   5.  The inferior vena cava is normal in size with greater than 50%  respiratory variability, suggesting right atrial pressure of 3 mmHg.   6. A small pericardial effusion is present. The pericardial effusion is  circumferential.   FINDINGS   Left Ventricle: Left ventricular ejection fraction, by estimation, is 55  to 60%. The left ventricle has normal function. The left ventricle has no  regional wall motion abnormalities. The left ventricular internal cavity  size was normal in size. There is   no left ventricular hypertrophy. Left ventricular diastolic parameters  are consistent with Grade I diastolic dysfunction (impaired relaxation).   Right Ventricle: The right ventricular size is normal. No increase in  right ventricular wall thickness. Right ventricular systolic function is  normal. Tricuspid regurgitation signal is inadequate for assessing PA  pressure.   Left Atrium: Left atrial size was normal in size.   Right Atrium: Right atrial size was normal in size.   Pericardium: A small pericardial effusion is present. The pericardial  effusion is circumferential.   Mitral Valve: The mitral valve is normal in structure. Mild mitral annular  calcification. Mild mitral valve regurgitation. No evidence of mitral  valve stenosis.   Tricuspid Valve: The tricuspid valve is normal in structure. Tricuspid  valve regurgitation is not demonstrated.   Aortic Valve: The aortic valve is tricuspid. There is mild calcification  of the aortic valve. Aortic valve regurgitation is moderate. Aortic  regurgitation PHT measures 425 msec. No aortic stenosis is present.   Pulmonic Valve: The pulmonic valve was normal in structure. Pulmonic valve  regurgitation is not visualized.   Aorta: The aortic root is normal in size and structure.   Venous: The inferior vena cava is normal in size with greater than 50%  respiratory variability, suggesting right atrial pressure of 3 mmHg.   IAS/Shunts: No  atrial level shunt detected by color flow Doppler.          Physical Exam:   VS:  BP (!) 111/46   Pulse 83   Ht 5\' 11"  (1.803 m)   Wt 167 lb 9.6 oz (76 kg)   SpO2 (!) 85%   BMI 23.38 kg/m    Wt Readings from Last 3 Encounters:  05/29/23 167 lb 9.6 oz (76 kg)  03/17/23 169 lb (76.7 kg)  03/10/23 169 lb 9.6 oz (76.9 kg)    GEN: Well nourished, well developed in no acute distress NECK: No JVD; No carotid bruits CARDIAC: RRR, no murmurs, rubs, gallops RESPIRATORY:  Clear to auscultation without rales, wheezing or rhonchi  ABDOMEN: Soft, non-tender, non-distended EXTREMITIES:  No edema; No deformity   ASSESSMENT AND PLAN: .   1.  Peripheral arterial disease -followed by Dr. Kirke Corin -He does feel like the surgery on his right leg has made a difference, thready pulses today on exam -No claudication but generalized weakness from lack of activity  2.  Coronary artery disease -no chest pains  -Continue current medications which include aspirin 81 mg daily, Imdur 30 mg daily, metoprolol succinate 25 mg daily, Crestor 40 mg daily, furosemide 20 mg (switch to as needed)  3.  Tachycardia -Heart rate in the 80s  today, he is on a lot of albuterol which could be contributing  4.  Chronic diastolic heart failure -Euvolemic on exam today -Blood pressure borderline, switched 20 mg of Lasix to as needed -Encourage low-sodium, heart healthy diet -Daily weights  5.  Hyperlipidemia -recent LDL 41 -Continue current dose of Crestor 40 mg daily  6. Insomnia -Advised against heavy sleep aids due to his lung function -Sleep hygiene reviewed -Can address with PCP  7. Low diastolic BP -Diastolic 46 today on repeat -Would recommend keeping track of blood pressure on a daily basis at home -Not on many blood pressure medications, taking Imdur for antianginal and metoprolol succinate      Dispo: He can follow-up in a few months with Dr. Kirke Corin or APP  Signed, Sharlene Dory, PA-C

## 2023-05-29 ENCOUNTER — Telehealth: Payer: Self-pay | Admitting: Pulmonary Disease

## 2023-05-29 ENCOUNTER — Encounter: Payer: Self-pay | Admitting: Physician Assistant

## 2023-05-29 ENCOUNTER — Ambulatory Visit: Payer: Medicare Other | Admitting: Physician Assistant

## 2023-05-29 VITALS — BP 111/46 | HR 83 | Ht 71.0 in | Wt 167.6 lb

## 2023-05-29 DIAGNOSIS — E785 Hyperlipidemia, unspecified: Secondary | ICD-10-CM | POA: Diagnosis not present

## 2023-05-29 DIAGNOSIS — I5032 Chronic diastolic (congestive) heart failure: Secondary | ICD-10-CM | POA: Diagnosis not present

## 2023-05-29 DIAGNOSIS — I2510 Atherosclerotic heart disease of native coronary artery without angina pectoris: Secondary | ICD-10-CM

## 2023-05-29 DIAGNOSIS — I739 Peripheral vascular disease, unspecified: Secondary | ICD-10-CM | POA: Diagnosis not present

## 2023-05-29 DIAGNOSIS — J441 Chronic obstructive pulmonary disease with (acute) exacerbation: Secondary | ICD-10-CM

## 2023-05-29 MED ORDER — PREDNISONE 20 MG PO TABS: 40.0000 mg | ORAL_TABLET | Freq: Every day | ORAL | 0 refills | Status: DC

## 2023-05-29 MED ORDER — ISOSORBIDE MONONITRATE ER 30 MG PO TB24: 30.0000 mg | ORAL_TABLET | Freq: Every morning | ORAL | 3 refills | Status: DC

## 2023-05-29 MED ORDER — FUROSEMIDE 20 MG PO TABS: 20.0000 mg | ORAL_TABLET | ORAL | 3 refills | Status: DC | PRN

## 2023-05-29 NOTE — Patient Instructions (Addendum)
Medication Instructions:  Your physician has recommended you make the following change in your medication:  DECREASE Lasix 20mg  take 1 tablet by mouth as needed.  *If you need a refill on your cardiac medications before your next appointment, please call your pharmacy*   Lab Work: CBC today.  If you have labs (blood work) drawn today and your tests are completely normal, you will receive your results only by: MyChart Message (if you have MyChart) OR A paper copy in the mail If you have any lab test that is abnormal or we need to change your treatment, we will call you to review the results.   Testing/Procedures: None   Follow-Up: At [facility], you and your health needs are our priority.  As part of our continuing mission to provide you with exceptional heart care, we have created designated Provider Care Teams.  These Care Teams include your primary Cardiologist (physician) and Advanced Practice Providers (APPs -  Physician Assistants and Nurse Practitioners) who all work together to provide you with the care you need, when you need it.  We recommend signing up for the patient portal called "MyChart".  Sign up information is provided on this After Visit Summary.  MyChart is used to connect with patients for Virtual Visits (Telemedicine).  Patients are able to view lab/test results, encounter notes, upcoming appointments, etc.  Non-urgent messages can be sent to your provider as well.   To learn more about what you can do with MyChart, go to [email or web address].    Your next appointment:   2 month(s)  Provider:   Lorine Bears, MD  or Jari Favre, PA-C, Ronie Spies, PA-C, Robin Searing, NP, Eligha Bridegroom, NP, or Tereso Newcomer, PA-C          Your physician has requested that you regularly monitor and record your blood pressure readings at home. Please use the same machine at the same time of day to check your readings and record them to bring to your follow-up visit.    Please monitor blood pressures and keep a log of your readings for 2 weeks and send via MyChart.   Make sure to check 2 hours after your medications.    AVOID these things for 30 minutes before checking your blood pressure: No Drinking caffeine. No Drinking alcohol. No Eating. No Smoking. No Exercising.   Five minutes before checking your blood pressure: Pee. Sit in a dining chair. Avoid sitting in a soft couch or armchair. Be quiet. Do not talk    Adopting a Healthy Lifestyle.   Weight: Know what a healthy weight is for you (roughly BMI <25) and aim to maintain this. You can calculate your body mass index on your smart phone. Unfortunately, this is not the most accurate measure of healthy weight, but it is the simplest measurement to use. A more accurate measurement involves body scanning which measures lean muscle, fat tissue and bony density. We do not have this equipment at [facility].    Diet: Aim for 7+ servings of fruits and vegetables daily Limit animal fats in diet for cholesterol and heart health - choose grass fed whenever available Avoid highly processed foods (fast food burgers, tacos, fried chicken, pizza, hot dogs, french fries)  Saturated fat comes in the form of butter, lard, coconut oil, margarine, partially hydrogenated oils, and fat in meat. These increase your risk of cardiovascular disease.  Use healthy plant oils, such as olive, canola, soy, corn, sunflower and peanut.  Whole foods such as fruits,  vegetables and whole grains have fiber  Men need > 38 grams of fiber per day Women need > 25 grams of fiber per day  Load up on vegetables and fruits - one-half of your plate: Aim for color and variety, and remember that potatoes dont count. Go for whole grains - one-quarter of your plate: Whole wheat, barley, wheat berries, quinoa, oats, brown rice, and foods made with them. If you want pasta, go with whole wheat pasta. Protein power - one-quarter of your plate:  Fish, chicken, beans, and nuts are all healthy, versatile protein sources. Limit red meat. You need carbohydrates for energy! The type of carbohydrate is more important than the amount. Choose carbohydrates such as vegetables, fruits, whole grains, beans, and nuts in the place of white rice, white pasta, potatoes (baked or fried), macaroni and cheese, cakes, cookies, and donuts.  If youre thirsty, drink water. Coffee and tea are good in moderation, but skip sugary drinks and limit milk and dairy products to one or two daily servings. Keep sugar intake at 6 teaspoons or 24 grams or LESS       Exercise: Aim for 150 min of moderate intensity exercise weekly for heart health, and weights twice weekly for bone health Stay active - any steps are better than no steps! Aim for 7-9 hours of sleep daily      Low-Sodium Eating Plan Sodium, which is an element that makes up salt, helps you maintain a healthy balance of fluids in your body. Too much sodium can increase your blood pressure and cause fluid and waste to be held in your body. Your health care provider or dietitian may recommend following this plan if you have high blood pressure (hypertension), kidney disease, liver disease, or heart failure. Eating less sodium can help lower your blood pressure, reduce swelling, and protect your heart, liver, and kidneys. What are tips for following this plan? General guidelines Most people on this plan should limit their sodium intake to 1,500-2,000 mg (milligrams) of sodium each day. Reading food labels  The Nutrition Facts label lists the amount of sodium in one serving of the food. If you eat more than one serving, you must multiply the listed amount of sodium by the number of servings. Choose foods with less than 140 mg of sodium per serving. Avoid foods with 300 mg of sodium or more per serving. Shopping Look for lower-sodium products, often labeled as "low-sodium" or "no salt added." Always check  the sodium content even if foods are labeled as "unsalted" or "no salt added". Buy fresh foods. Avoid canned foods and premade or frozen meals. Avoid canned, cured, or processed meats Buy breads that have less than 80 mg of sodium per slice. Cooking Eat more home-cooked food and less restaurant, buffet, and fast food. Avoid adding salt when cooking. Use salt-free seasonings or herbs instead of table salt or sea salt. Check with your health care provider or pharmacist before using salt substitutes. Cook with plant-based oils, such as canola, sunflower, or olive oil. Meal planning When eating at a restaurant, ask that your food be prepared with less salt or no salt, if possible. Avoid foods that contain MSG (monosodium glutamate). MSG is sometimes added to [place] food, bouillon, and some canned foods. What foods are recommended? The items listed may not be a complete list. Talk with your dietitian about what dietary choices are best for you. Grains Low-sodium cereals, including oats, puffed wheat and rice, and shredded wheat. Low-sodium crackers. Unsalted rice.  Unsalted pasta. Low-sodium bread. Whole-grain breads and whole-grain pasta. Vegetables Fresh or frozen vegetables. "No salt added" canned vegetables. "No salt added" tomato sauce and paste. Low-sodium or reduced-sodium tomato and vegetable juice. Fruits Fresh, frozen, or canned fruit. Fruit juice. Meats and other protein foods Fresh or frozen (no salt added) meat, poultry, seafood, and fish. Low-sodium canned tuna and salmon. Unsalted nuts. Dried peas, beans, and lentils without added salt. Unsalted canned beans. Eggs. Unsalted nut butters. Dairy Milk. Soy milk. Cheese that is naturally low in sodium, such as ricotta cheese, fresh mozzarella, or Swiss cheese Low-sodium or reduced-sodium cheese. Cream cheese. Yogurt. Fats and oils Unsalted butter. Unsalted margarine with no trans fat. Vegetable oils such as canola or olive  oils. Seasonings and other foods Fresh and dried herbs and spices. Salt-free seasonings. Low-sodium mustard and ketchup. Sodium-free salad dressing. Sodium-free light mayonnaise. Fresh or refrigerated horseradish. Lemon juice. Vinegar. Homemade, reduced-sodium, or low-sodium soups. Unsalted popcorn and pretzels. Low-salt or salt-free chips. What foods are not recommended? The items listed may not be a complete list. Talk with your dietitian about what dietary choices are best for you. Grains Instant hot cereals. Bread stuffing, pancake, and biscuit mixes. Croutons. Seasoned rice or pasta mixes. Noodle soup cups. Boxed or frozen macaroni and cheese. Regular salted crackers. Self-rising flour. Vegetables Sauerkraut, pickled vegetables, and relishes. Olives. [place] fries. Onion rings. Regular canned vegetables (not low-sodium or reduced-sodium). Regular canned tomato sauce and paste (not low-sodium or reduced-sodium). Regular tomato and vegetable juice (not low-sodium or reduced-sodium). Frozen vegetables in sauces. Meats and other protein foods Meat or fish that is salted, canned, smoked, spiced, or pickled. Bacon, ham, sausage, hotdogs, corned beef, chipped beef, packaged lunch meats, salt pork, jerky, pickled herring, anchovies, regular canned tuna, sardines, salted nuts. Dairy Processed cheese and cheese spreads. Cheese curds. Blue cheese. Feta cheese. String cheese. Regular cottage cheese. Buttermilk. Canned milk. Fats and oils Salted butter. Regular margarine. Ghee. Bacon fat. Seasonings and other foods Onion salt, garlic salt, seasoned salt, table salt, and sea salt. Canned and packaged gravies. Worcestershire sauce. Tartar sauce. Barbecue sauce. Teriyaki sauce. Soy sauce, including reduced-sodium. Steak sauce. Fish sauce. Oyster sauce. Cocktail sauce. Horseradish that you find on the shelf. Regular ketchup and mustard. Meat flavorings and tenderizers. Bouillon cubes. Hot sauce and Tabasco sauce.  Premade or packaged marinades. Premade or packaged taco seasonings. Relishes. Regular salad dressings. Salsa. Potato and tortilla chips. Corn chips and puffs. Salted popcorn and pretzels. Canned or dried soups. Pizza. Frozen entrees and pot pies. Summary Eating less sodium can help lower your blood pressure, reduce swelling, and protect your heart, liver, and kidneys. Most people on this plan should limit their sodium intake to 1,500-2,000 mg (milligrams) of sodium each day. Canned, boxed, and frozen foods are high in sodium. Restaurant foods, fast foods, and pizza are also very high in sodium. You also get sodium by adding salt to food. Try to cook at home, eat more fresh fruits and vegetables, and eat less fast food, canned, processed, or prepared foods. This information is not intended to replace advice given to you by your health care provider. Make sure you discuss any questions you have with your health care provider. Document Revised: 10/02/2017 Document Reviewed: 10/13/2016 Elsevier Patient Education  2020 [employer].

## 2023-05-29 NOTE — Telephone Encounter (Signed)
Patient has been having breathing issues for the last couple of weeks due to the heat and would like prednisone called into [facility] on [address]. Please advise and call back.

## 2023-05-29 NOTE — Telephone Encounter (Signed)
Advised patient prednisone has been sent to pharmacy. Closing encounter. NFN

## 2023-06-09 ENCOUNTER — Other Ambulatory Visit (HOSPITAL_COMMUNITY): Payer: Self-pay

## 2023-06-10 ENCOUNTER — Other Ambulatory Visit (HOSPITAL_BASED_OUTPATIENT_CLINIC_OR_DEPARTMENT_OTHER): Payer: Self-pay

## 2023-06-10 ENCOUNTER — Ambulatory Visit: Payer: Medicare Other | Admitting: Cardiology

## 2023-06-10 MED ORDER — ALBUTEROL SULFATE (2.5 MG/3ML) 0.083% IN NEBU: 2.5000 mg | INHALATION_SOLUTION | RESPIRATORY_TRACT | 12 refills | Status: DC | PRN

## 2023-06-11 ENCOUNTER — Telehealth: Payer: Self-pay

## 2023-06-11 ENCOUNTER — Encounter (HOSPITAL_BASED_OUTPATIENT_CLINIC_OR_DEPARTMENT_OTHER): Payer: Self-pay | Admitting: Pulmonary Disease

## 2023-06-11 ENCOUNTER — Ambulatory Visit (INDEPENDENT_AMBULATORY_CARE_PROVIDER_SITE_OTHER): Payer: Medicare Other | Admitting: Pulmonary Disease

## 2023-06-11 ENCOUNTER — Other Ambulatory Visit (HOSPITAL_COMMUNITY): Payer: Self-pay

## 2023-06-11 VITALS — BP 120/58 | HR 86 | Resp 18 | Ht 71.0 in | Wt 164.0 lb

## 2023-06-11 DIAGNOSIS — J9611 Chronic respiratory failure with hypoxia: Secondary | ICD-10-CM | POA: Diagnosis not present

## 2023-06-11 DIAGNOSIS — J441 Chronic obstructive pulmonary disease with (acute) exacerbation: Secondary | ICD-10-CM | POA: Diagnosis not present

## 2023-06-11 DIAGNOSIS — J439 Emphysema, unspecified: Secondary | ICD-10-CM | POA: Diagnosis not present

## 2023-06-11 DIAGNOSIS — J4489 Other specified chronic obstructive pulmonary disease: Secondary | ICD-10-CM

## 2023-06-11 DIAGNOSIS — R0602 Shortness of breath: Secondary | ICD-10-CM

## 2023-06-11 MED ORDER — PREDNISONE 10 MG PO TABS: ORAL_TABLET | ORAL | 0 refills | Status: AC

## 2023-06-11 MED ORDER — TIOTROPIUM BROMIDE MONOHYDRATE 18 MCG IN CAPS: 18.0000 ug | ORAL_CAPSULE | Freq: Every day | RESPIRATORY_TRACT | 2 refills | Status: DC

## 2023-06-11 MED ORDER — ALBUTEROL SULFATE (2.5 MG/3ML) 0.083% IN NEBU: 2.5000 mg | INHALATION_SOLUTION | RESPIRATORY_TRACT | 12 refills | Status: DC | PRN

## 2023-06-11 MED ORDER — FLUTICASONE-SALMETEROL 230-21 MCG/ACT IN AERO: 2.0000 | INHALATION_SPRAY | Freq: Two times a day (BID) | RESPIRATORY_TRACT | 5 refills | Status: DC

## 2023-06-11 NOTE — Telephone Encounter (Signed)
*  Pulm  Pharmacy Patient Advocate Encounter  Received notification from [facility] that Prior Authorization for Albuterol Sulfate (2.5 MG/3ML)0.083% nebulizer solution  has been CANCELLED due to medication being covered under Part B per test claims   PA #/Case ID/Reference #: [id number]

## 2023-06-11 NOTE — Patient Instructions (Signed)
STOP Breztri  Prednisone taper ordered START Advair HFA 230-21 mcg TWO puffs in the morning and evening Continue Albuterol as needed for shortness of breath and wheezing CONTINUE oxygen for 2L at rest and  4L with exertion for goal SpO2 88%

## 2023-06-11 NOTE — Telephone Encounter (Signed)
New Albuterol neb prescription sent to pharmacy with note to pharmacy to process under Medicare part B.  Nothing further at this time.

## 2023-06-11 NOTE — Addendum Note (Signed)
Addended by: Jacquiline Doe on: 06/11/2023 02:07 PM   Modules accepted: Orders

## 2023-06-11 NOTE — Progress Notes (Signed)
Chase Lloyd    [id number]    June 11, 1956  Primary Care Physician:Nafziger, Kandee Keen, NP Date of Appointment: 06/11/2023 Established Patient Visit  Chief complaint:   Chief Complaint  Patient presents with   Follow-up     HPI: Chase Lloyd is a 67 y.o. man with COPD on home oxygen and AVN of the left hip.    Interval Updates:  12/22/22 Since his last visit he has been participating in Pulmonary Rehab at [facility]. Initially doing well however beg having increased shortness of breath, requiring nebulizer twice daily. He has also needed to increase his home O2 of 2L to 3L. He is also here for pre-op clearance for EGD/colonoscopy. Wife is present and has questions regarding his overall health and eligibility for lung transplant  06/11/23 Since our last visit he has been denied for lung transplant. Since completing rehab he has not been exercising. He is feeling tired. Wife is getting him out of the house. In the last two months he has had worsening shortness of breath due to the humidity and weather changes this summer. Some increased coughing with sputum production. Was treated prednisone for COPD exacerbation by his PCP in July. Compliant with Breztri and using nebulizer twice a day.  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  Used to live in [place] pack year smoking history, quit 2019  Past Medical History:  Diagnosis Date   Arthritis    Back pain    COPD (chronic obstructive pulmonary disease) (HCC)    Coronary artery disease    COVID    mild case   DDD (degenerative disc disease), lumbar    Depression    Dyspnea    uses O2 as needed   Emphysema of lung (HCC)    Empyema (HCC)    2016  (was in [place])   GERD (gastroesophageal reflux disease)    Hearing loss of right ear    started 2001.     microphone in right  ear ,  and hearing aid in left   Hodgkin's disease (HCC) 1999   Hypotension    Meniere's disease    PAD (peripheral artery disease) (HCC)    Pneumonia    Tinnitus     Past Surgical History:  Procedure Laterality Date   ABDOMINAL AORTOGRAM W/LOWER EXTREMITY Bilateral 02/04/2023   Procedure: ABDOMINAL AORTOGRAM W/LOWER EXTREMITY;  Surgeon: Iran Ouch, MD;  Location: [facility];  Service: Cardiovascular;  Laterality: Bilateral;   BACK SURGERY     BIOPSY  01/15/2023   Procedure: BIOPSY;  Surgeon: Imogene Burn, MD;  Location: [facility];  Service: Gastroenterology;;   CERVICAL FUSION  2016   COLONOSCOPY WITH PROPOFOL N/A 01/15/2023   Procedure: COLONOSCOPY WITH PROPOFOL;  Surgeon: Imogene Burn, MD;  Location: [facility];  Service: Gastroenterology;  Laterality: N/A;   ELBOW SURGERY     ENDARTERECTOMY FEMORAL Right 02/11/2023   Procedure: RIGHT ILIO-FEMORAL ENDARTERECTOMY WITH PATCH ANGIOPLASTY;  Surgeon: [name], MD;  Location: [facility] OR;  Service: Vascular;  Laterality: Right;   ESOPHAGOGASTRODUODENOSCOPY (EGD) WITH PROPOFOL N/A 01/15/2023   Procedure: ESOPHAGOGASTRODUODENOSCOPY (EGD) WITH PROPOFOL;  Surgeon: Imogene Burn, MD;  Location: [facility];  Service: Gastroenterology;  Laterality: N/A;   EYE SURGERY     IR THORACENTESIS ASP PLEURAL  SPACE W/IMG GUIDE  10/08/2018   LAMINECTOMY  1999   lower back surgery     PERIPHERAL VASCULAR INTERVENTION Left 02/04/2023   Procedure: PERIPHERAL VASCULAR INTERVENTION;  Surgeon: Iran Ouch, MD;  Location: [facility];  Service: Cardiovascular;  Laterality: Left;  left external iliac   POLYPECTOMY  01/15/2023   Procedure: POLYPECTOMY;  Surgeon: Imogene Burn, MD;  Location: [facility];  Service: Gastroenterology;;   RIGHT/LEFT HEART CATH AND CORONARY ANGIOGRAPHY N/A 04/29/2019   Procedure: RIGHT/LEFT HEART CATH AND CORONARY ANGIOGRAPHY;  Surgeon: Lennette Bihari, MD;  Location: [facility];  Service:  Cardiovascular;  Laterality: N/A;   TONSILLECTOMY      Family History  Problem Relation Age of Onset   Hypertension Father    Diabetes Father    Testicular cancer Brother    Melanoma Brother     Social History   Occupational History   Occupation: Retired  Tobacco Use   Smoking status: Former    Current packs/day: 0.00    Average packs/day: 1.5 packs/day for 47.0 years (70.5 ttl pk-yrs)    Types: Cigarettes    Start date: 06/14/1971    Quit date: 06/13/2018    Years since quitting: 4.9   Smokeless tobacco: Never  Vaping Use   Vaping status: Never Used  Substance and Sexual Activity   Alcohol use: Yes    Comment: Occasional   Drug use: No   Sexual activity: Not on file   Physical Exam: Blood pressure (!) 120/58, pulse 86, resp. rate 18, height 5\' 11"  (1.803 m), weight 164 lb (74.4 kg), SpO2 (!) 84%.  Physical Exam: General: Chronically ill-appearing, no acute distress HENT: [place], AT, OP clear, MMM Eyes: EOMI, no scleral icterus Respiratory: Diminished to auscultation bilaterally.  No crackles, wheezing or rales Cardiovascular: RRR, -M/R/G, no JVD Extremities:-Edema,-tenderness Neuro: AAO x4, CNII-XII grossly intact  Data Reviewed: Imaging: CT Chest Lung Screen Cancer 11/12/21 - biapical pleuroparenchymal scarring. Bullous centrilobular and paraseptal emphysema with upper lobe predominance. Post- radiation in right upper lobe, right fibrothorax CT Chest Lung Screen 11/11/22 - patchy consolidation in lingular LLL. Unchanged biapical pleuroparenchymal scarring, bullous emphysema. Post-radiation in RUL, right fibrothorax increased in size.   PFTs:      Latest Ref Rng & Units 08/28/2022    3:47 PM 10/22/2018    1:59 PM 07/30/2017   11:53 AM  PFT Results  FVC-Pre L 1.90  2.42  2.45   FVC-Predicted Pre % 40  50  50   FVC-Post L 2.11  2.87  2.90   FVC-Predicted Post % 45  59  59   Pre FEV1/FVC % % 41  46  46   Post FEV1/FCV % % 45  44  48   FEV1-Pre L 0.79  1.10  1.13    FEV1-Predicted Pre % 22  30  31    FEV1-Post L 0.94  1.26  1.39   DLCO uncorrected ml/min/mmHg 6.91  12.79  13.19   DLCO UNC% % 25  37  39   DLCO corrected ml/min/mmHg 6.91  14.51  14.16   DLCO COR %Predicted % 25  43  42   DLVA Predicted % 41  67  59   TLC L 4.65  5.86    TLC % Predicted % 64  81    RV % Predicted % 90  129     Very severe emphysema, with an FEV1 of 22% of predicted, post FEV1 27% on 08/28/22.  These results  are consistent when trended over 2018 in 2019.  Labs:  Lab Results  Component Value Date   WBC 8.6 05/29/2023   HGB 9.2 (L) 05/29/2023   HCT 28.8 (L) 05/29/2023   MCV 93 05/29/2023   PLT 174 05/29/2023   Lab Results  Component Value Date   NA 138 02/12/2023   K 3.8 02/12/2023   CL 102 02/12/2023   CO2 25 02/12/2023   A1AT within normal range.  Elevated CO2 and ABG with hypercapnia  Immunization status: Immunization History  Administered Date(s) Administered   Fluad Quad(high Dose 65+) 09/02/2019   Influenza,inj,Quad PF,6+ Mos 12/27/2015, 10/11/2018   Pneumococcal Conjugate-13 12/26/2015   Td 01/01/2014   Zoster, Live 08/04/2015    Assessment:  Very Severe Emphysema FEV1 27% of predicted COPD exacerbation - in flare Chronic Respiratory Failure (with hypoxemia and hypercapnia) on increased 4LNC, baseline 3L Avascular necrosis, left hip Hodgkin's disease status post mantle field radiation in 1999, now in remission  Progressive COPD BODE 5 with FEV1 22% on 08/2022 PFTs  [facility] evaluation 04/2023 for transplant determined he was not a candidate due to "compex chest anatomy due to mantle radiation history, empyema and thickened pleura"  Plan/Recommendations: STOP Breztri  Prednisone taper ordered START Advair HFA 230-21 mcg TWO puffs in the morning and evening Continue Albuterol as needed for shortness of breath and wheezing CONTINUE oxygen for 2L at rest and  4L with exertion for goal SpO2 88%  Return to Care: Return September or  October.  I have spent a total time of 30-minutes on the day of the appointment including chart review, data review, collecting history, coordinating care and discussing medical diagnosis and plan with the patient/family. Past medical history, allergies, medications were reviewed. Pertinent imaging, labs and tests included in this note have been reviewed and interpreted independently by me.  Mechele Collin, MD Pulmonary and Critical Care Medicine [facility] Office:[phone]

## 2023-06-24 ENCOUNTER — Encounter (HOSPITAL_BASED_OUTPATIENT_CLINIC_OR_DEPARTMENT_OTHER): Payer: Self-pay | Admitting: Pulmonary Disease

## 2023-07-04 ENCOUNTER — Encounter (HOSPITAL_COMMUNITY): Payer: Self-pay

## 2023-07-04 ENCOUNTER — Emergency Department (HOSPITAL_COMMUNITY): Payer: Medicare Other

## 2023-07-04 ENCOUNTER — Inpatient Hospital Stay (HOSPITAL_COMMUNITY)
Admission: EM | Admit: 2023-07-04 | Discharge: 2023-07-06 | DRG: 871 | Disposition: A | Discharge status: Home / Self Care | Payer: Medicare Other | Attending: Internal Medicine | Admitting: Internal Medicine

## 2023-07-04 ENCOUNTER — Other Ambulatory Visit: Payer: Self-pay

## 2023-07-04 ENCOUNTER — Inpatient Hospital Stay (HOSPITAL_COMMUNITY): Payer: Medicare Other

## 2023-07-04 DIAGNOSIS — Z6821 Body mass index (BMI) 21.0-21.9, adult: Secondary | ICD-10-CM

## 2023-07-04 DIAGNOSIS — G4700 Insomnia, unspecified: Secondary | ICD-10-CM | POA: Diagnosis present

## 2023-07-04 DIAGNOSIS — Z833 Family history of diabetes mellitus: Secondary | ICD-10-CM

## 2023-07-04 DIAGNOSIS — J440 Chronic obstructive pulmonary disease with acute lower respiratory infection: Secondary | ICD-10-CM | POA: Diagnosis present

## 2023-07-04 DIAGNOSIS — C384 Malignant neoplasm of pleura: Secondary | ICD-10-CM | POA: Diagnosis present

## 2023-07-04 DIAGNOSIS — E876 Hypokalemia: Secondary | ICD-10-CM | POA: Diagnosis present

## 2023-07-04 DIAGNOSIS — R946 Abnormal results of thyroid function studies: Secondary | ICD-10-CM | POA: Diagnosis present

## 2023-07-04 DIAGNOSIS — A419 Sepsis, unspecified organism: Principal | ICD-10-CM | POA: Diagnosis present

## 2023-07-04 DIAGNOSIS — I700 Atherosclerosis of aorta: Secondary | ICD-10-CM | POA: Diagnosis not present

## 2023-07-04 DIAGNOSIS — I5032 Chronic diastolic (congestive) heart failure: Secondary | ICD-10-CM | POA: Diagnosis not present

## 2023-07-04 DIAGNOSIS — Z8572 Personal history of non-Hodgkin lymphomas: Secondary | ICD-10-CM

## 2023-07-04 DIAGNOSIS — J189 Pneumonia, unspecified organism: Secondary | ICD-10-CM

## 2023-07-04 DIAGNOSIS — J90 Pleural effusion, not elsewhere classified: Principal | ICD-10-CM

## 2023-07-04 DIAGNOSIS — Z79899 Other long term (current) drug therapy: Secondary | ICD-10-CM | POA: Diagnosis not present

## 2023-07-04 DIAGNOSIS — J9611 Chronic respiratory failure with hypoxia: Secondary | ICD-10-CM

## 2023-07-04 DIAGNOSIS — D7389 Other diseases of spleen: Secondary | ICD-10-CM | POA: Diagnosis not present

## 2023-07-04 DIAGNOSIS — Z87891 Personal history of nicotine dependence: Secondary | ICD-10-CM

## 2023-07-04 DIAGNOSIS — R6510 Systemic inflammatory response syndrome (SIRS) of non-infectious origin without acute organ dysfunction: Secondary | ICD-10-CM | POA: Diagnosis not present

## 2023-07-04 DIAGNOSIS — Z981 Arthrodesis status: Secondary | ICD-10-CM

## 2023-07-04 DIAGNOSIS — J918 Pleural effusion in other conditions classified elsewhere: Secondary | ICD-10-CM

## 2023-07-04 DIAGNOSIS — J4489 Other specified chronic obstructive pulmonary disease: Secondary | ICD-10-CM | POA: Diagnosis present

## 2023-07-04 DIAGNOSIS — J439 Emphysema, unspecified: Secondary | ICD-10-CM | POA: Diagnosis not present

## 2023-07-04 DIAGNOSIS — C3491 Malignant neoplasm of unspecified part of right bronchus or lung: Secondary | ICD-10-CM | POA: Diagnosis not present

## 2023-07-04 DIAGNOSIS — I517 Cardiomegaly: Secondary | ICD-10-CM | POA: Diagnosis not present

## 2023-07-04 DIAGNOSIS — I2510 Atherosclerotic heart disease of native coronary artery without angina pectoris: Secondary | ICD-10-CM

## 2023-07-04 DIAGNOSIS — Z7982 Long term (current) use of aspirin: Secondary | ICD-10-CM

## 2023-07-04 DIAGNOSIS — Z808 Family history of malignant neoplasm of other organs or systems: Secondary | ICD-10-CM

## 2023-07-04 DIAGNOSIS — I10 Essential (primary) hypertension: Secondary | ICD-10-CM

## 2023-07-04 DIAGNOSIS — C8190 Hodgkin lymphoma, unspecified, unspecified site: Secondary | ICD-10-CM | POA: Diagnosis present

## 2023-07-04 DIAGNOSIS — I739 Peripheral vascular disease, unspecified: Secondary | ICD-10-CM

## 2023-07-04 DIAGNOSIS — R112 Nausea with vomiting, unspecified: Secondary | ICD-10-CM | POA: Diagnosis not present

## 2023-07-04 DIAGNOSIS — D649 Anemia, unspecified: Secondary | ICD-10-CM

## 2023-07-04 DIAGNOSIS — D638 Anemia in other chronic diseases classified elsewhere: Secondary | ICD-10-CM | POA: Diagnosis present

## 2023-07-04 DIAGNOSIS — Z4682 Encounter for fitting and adjustment of non-vascular catheter: Secondary | ICD-10-CM | POA: Diagnosis not present

## 2023-07-04 DIAGNOSIS — Z8249 Family history of ischemic heart disease and other diseases of the circulatory system: Secondary | ICD-10-CM

## 2023-07-04 DIAGNOSIS — J910 Malignant pleural effusion: Secondary | ICD-10-CM

## 2023-07-04 DIAGNOSIS — F32A Depression, unspecified: Secondary | ICD-10-CM | POA: Diagnosis present

## 2023-07-04 DIAGNOSIS — Z8043 Family history of malignant neoplasm of testis: Secondary | ICD-10-CM

## 2023-07-04 DIAGNOSIS — R197 Diarrhea, unspecified: Secondary | ICD-10-CM | POA: Diagnosis present

## 2023-07-04 DIAGNOSIS — H9191 Unspecified hearing loss, right ear: Secondary | ICD-10-CM | POA: Diagnosis present

## 2023-07-04 DIAGNOSIS — E43 Unspecified severe protein-calorie malnutrition: Secondary | ICD-10-CM | POA: Diagnosis present

## 2023-07-04 DIAGNOSIS — K219 Gastro-esophageal reflux disease without esophagitis: Secondary | ICD-10-CM

## 2023-07-04 DIAGNOSIS — Z8616 Personal history of COVID-19: Secondary | ICD-10-CM | POA: Diagnosis not present

## 2023-07-04 DIAGNOSIS — J984 Other disorders of lung: Secondary | ICD-10-CM | POA: Diagnosis not present

## 2023-07-04 DIAGNOSIS — R54 Age-related physical debility: Secondary | ICD-10-CM | POA: Diagnosis present

## 2023-07-04 DIAGNOSIS — R918 Other nonspecific abnormal finding of lung field: Secondary | ICD-10-CM | POA: Diagnosis not present

## 2023-07-04 DIAGNOSIS — I110 Hypertensive heart disease with heart failure: Secondary | ICD-10-CM | POA: Diagnosis present

## 2023-07-04 DIAGNOSIS — R110 Nausea: Secondary | ICD-10-CM | POA: Diagnosis not present

## 2023-07-04 DIAGNOSIS — J690 Pneumonitis due to inhalation of food and vomit: Secondary | ICD-10-CM

## 2023-07-04 DIAGNOSIS — Z77090 Contact with and (suspected) exposure to asbestos: Secondary | ICD-10-CM | POA: Diagnosis present

## 2023-07-04 DIAGNOSIS — Z7951 Long term (current) use of inhaled steroids: Secondary | ICD-10-CM

## 2023-07-04 DIAGNOSIS — Z9981 Dependence on supplemental oxygen: Secondary | ICD-10-CM | POA: Diagnosis not present

## 2023-07-04 HISTORY — DX: Nausea with vomiting, unspecified: R11.2

## 2023-07-04 LAB — COMPREHENSIVE METABOLIC PANEL
ALT: 21 U/L (ref 0–44)
AST: 19 U/L (ref 15–41)
Albumin: 2.7 g/dL — ABNORMAL LOW (ref 3.5–5.0)
Alkaline Phosphatase: 73 U/L (ref 38–126)
Anion gap: 9 (ref 5–15)
BUN: 9 mg/dL (ref 8–23)
CO2: 32 mmol/L (ref 22–32)
Calcium: 9.1 mg/dL (ref 8.9–10.3)
Chloride: 94 mmol/L — ABNORMAL LOW (ref 98–111)
Creatinine, Ser: 1.13 mg/dL (ref 0.61–1.24)
GFR, Estimated: >60 mL/min (ref >60)
Glucose, Bld: 120 mg/dL — ABNORMAL HIGH (ref 70–99)
Potassium: 2.9 mmol/L — ABNORMAL LOW (ref 3.5–5.1)
Sodium: 135 mmol/L (ref 135–145)
Total Bilirubin: 1.1 mg/dL (ref 0.3–1.2)
Total Protein: 6.8 g/dL (ref 6.5–8.1)

## 2023-07-04 LAB — BRAIN NATRIURETIC PEPTIDE: B Natriuretic Peptide: 48.4 pg/mL (ref 0.0–100.0)

## 2023-07-04 LAB — CBC
HCT: 33.4 % — ABNORMAL LOW (ref 39.0–52.0)
Hemoglobin: 10.7 g/dL — ABNORMAL LOW (ref 13.0–17.0)
MCH: 30.1 pg (ref 26.0–34.0)
MCHC: 32 g/dL (ref 30.0–36.0)
MCV: 94.1 fL (ref 80.0–100.0)
Platelets: 152 10*3/uL (ref 150–400)
RBC: 3.55 MIL/uL — ABNORMAL LOW (ref 4.22–5.81)
RDW: 13.2 % (ref 11.5–15.5)
WBC: 7.7 10*3/uL (ref 4.0–10.5)
nRBC: 0 % (ref 0.0–0.2)

## 2023-07-04 LAB — LACTIC ACID, PLASMA
Lactic Acid, Venous: 1.1 mmol/L (ref 0.5–1.9)
Lactic Acid, Venous: 0.9 mmol/L (ref 0.5–1.9)

## 2023-07-04 LAB — HIV ANTIBODY (ROUTINE TESTING W REFLEX): HIV Screen 4th Generation wRfx: NONREACTIVE

## 2023-07-04 LAB — BODY FLUID CELL COUNT WITH DIFFERENTIAL
Lymphs, Fluid: 62 %
Neutrophil Count, Fluid: 38 % — ABNORMAL HIGH (ref 0–25)
Total Nucleated Cell Count, Fluid: 278 cu mm (ref 0–1000)

## 2023-07-04 LAB — URINALYSIS, ROUTINE W REFLEX MICROSCOPIC
Bacteria, UA: NONE SEEN
Bilirubin Urine: NEGATIVE
Glucose, UA: NEGATIVE mg/dL
Hgb urine dipstick: NEGATIVE
Ketones, ur: NEGATIVE mg/dL
Leukocytes,Ua: NEGATIVE
Nitrite: NEGATIVE
Protein, ur: 100 mg/dL — AB
Specific Gravity, Urine: 1.023 (ref 1.005–1.030)
pH: 5 (ref 5.0–8.0)

## 2023-07-04 LAB — AMYLASE: Amylase: 36 U/L (ref 28–100)

## 2023-07-04 LAB — MAGNESIUM: Magnesium: 1.5 mg/dL — ABNORMAL LOW (ref 1.7–2.4)

## 2023-07-04 LAB — ALBUMIN, PLEURAL OR PERITONEAL FLUID: Albumin, Fluid: <1.5 g/dL

## 2023-07-04 LAB — GRAM STAIN: Gram Stain: NONE SEEN

## 2023-07-04 LAB — LACTATE DEHYDROGENASE, PLEURAL OR PERITONEAL FLUID: LD, Fluid: 1018 U/L — ABNORMAL HIGH (ref 3–23)

## 2023-07-04 LAB — LIPASE, BLOOD: Lipase: 18 U/L (ref 11–51)

## 2023-07-04 LAB — PROTEIN, PLEURAL OR PERITONEAL FLUID: Total protein, fluid: 3.2 g/dL

## 2023-07-04 LAB — STREP PNEUMONIAE URINARY ANTIGEN: Strep Pneumo Urinary Antigen: NEGATIVE

## 2023-07-04 LAB — TSH: TSH: 8.374 u[IU]/mL — ABNORMAL HIGH (ref 0.350–4.500)

## 2023-07-04 LAB — GLUCOSE, PLEURAL OR PERITONEAL FLUID: Glucose, Fluid: <20 mg/dL

## 2023-07-04 LAB — MRSA NEXT GEN BY PCR, NASAL: MRSA by PCR Next Gen: NOT DETECTED

## 2023-07-04 LAB — LACTATE DEHYDROGENASE: LDH: 163 U/L (ref 98–192)

## 2023-07-04 LAB — PROCALCITONIN: Procalcitonin: <0.1 ng/mL

## 2023-07-04 MED ORDER — METOPROLOL SUCCINATE ER 25 MG PO TB24
25.0000 mg | ORAL_TABLET | Freq: Every day | ORAL | Status: DC
  Administered 2023-07-04 – 2023-07-06 (×3): 25 mg via ORAL
  Filled 2023-07-04 (×3): qty 1

## 2023-07-04 MED ORDER — ISOSORBIDE MONONITRATE ER 30 MG PO TB24
30.0000 mg | ORAL_TABLET | Freq: Every morning | ORAL | Status: DC
  Administered 2023-07-05 – 2023-07-06 (×2): 30 mg via ORAL
  Filled 2023-07-04 (×2): qty 1

## 2023-07-04 MED ORDER — ROSUVASTATIN CALCIUM 5 MG PO TABS
10.0000 mg | ORAL_TABLET | Freq: Every day | ORAL | Status: DC
  Administered 2023-07-04 – 2023-07-06 (×3): 10 mg via ORAL
  Filled 2023-07-04 (×3): qty 2

## 2023-07-04 MED ORDER — ALBUTEROL SULFATE (2.5 MG/3ML) 0.083% IN NEBU: 2.5000 mg | INHALATION_SOLUTION | Freq: Four times a day (QID) | RESPIRATORY_TRACT | Status: DC | PRN

## 2023-07-04 MED ORDER — ENSURE ENLIVE PO LIQD
237.0000 mL | Freq: Two times a day (BID) | ORAL | Status: DC
  Administered 2023-07-05: 237 mL via ORAL

## 2023-07-04 MED ORDER — IOHEXOL 350 MG/ML SOLN
100.0000 mL | Freq: Once | INTRAVENOUS | Status: AC | PRN
  Administered 2023-07-04: 100 mL via INTRAVENOUS

## 2023-07-04 MED ORDER — UMECLIDINIUM BROMIDE 62.5 MCG/ACT IN AEPB
1.0000 | INHALATION_SPRAY | Freq: Every day | RESPIRATORY_TRACT | Status: DC
  Administered 2023-07-05 – 2023-07-06 (×2): 1 via RESPIRATORY_TRACT
  Filled 2023-07-04: qty 7

## 2023-07-04 MED ORDER — SODIUM CHLORIDE 0.9 % IV SOLN: 2.0000 g | INTRAVENOUS | Status: DC

## 2023-07-04 MED ORDER — ACETAMINOPHEN 650 MG RE SUPP: 650.0000 mg | Freq: Four times a day (QID) | RECTAL | Status: DC | PRN

## 2023-07-04 MED ORDER — MOMETASONE FURO-FORMOTEROL FUM 200-5 MCG/ACT IN AERO
2.0000 | INHALATION_SPRAY | Freq: Two times a day (BID) | RESPIRATORY_TRACT | Status: DC
  Administered 2023-07-04 – 2023-07-06 (×4): 2 via RESPIRATORY_TRACT
  Filled 2023-07-04: qty 8.8

## 2023-07-04 MED ORDER — POTASSIUM CHLORIDE 10 MEQ/100ML IV SOLN
10.0000 meq | INTRAVENOUS | Status: AC
  Administered 2023-07-04 (×2): 10 meq via INTRAVENOUS
  Filled 2023-07-04 (×2): qty 100

## 2023-07-04 MED ORDER — SODIUM CHLORIDE 0.9% FLUSH
10.0000 mL | Freq: Three times a day (TID) | INTRAVENOUS | Status: DC
  Administered 2023-07-04 – 2023-07-06 (×5): 10 mL via INTRAPLEURAL

## 2023-07-04 MED ORDER — HALOPERIDOL LACTATE 5 MG/ML IJ SOLN
5.0000 mg | Freq: Once | INTRAMUSCULAR | Status: AC
  Administered 2023-07-04: 5 mg via INTRAVENOUS
  Filled 2023-07-04: qty 1

## 2023-07-04 MED ORDER — SODIUM CHLORIDE 0.9 % IV SOLN
3.0000 g | Freq: Four times a day (QID) | INTRAVENOUS | Status: DC
  Administered 2023-07-05 – 2023-07-06 (×6): 3 g via INTRAVENOUS
  Filled 2023-07-04 (×7): qty 8

## 2023-07-04 MED ORDER — ASPIRIN 81 MG PO TBEC
81.0000 mg | DELAYED_RELEASE_TABLET | Freq: Every day | ORAL | Status: DC
  Administered 2023-07-04 – 2023-07-06 (×3): 81 mg via ORAL
  Filled 2023-07-04 (×3): qty 1

## 2023-07-04 MED ORDER — ACETAMINOPHEN 325 MG PO TABS: 650.0000 mg | ORAL_TABLET | Freq: Four times a day (QID) | ORAL | Status: DC | PRN

## 2023-07-04 MED ORDER — SODIUM CHLORIDE 0.9% FLUSH
3.0000 mL | Freq: Two times a day (BID) | INTRAVENOUS | Status: DC
  Administered 2023-07-04 – 2023-07-06 (×4): 3 mL via INTRAVENOUS

## 2023-07-04 MED ORDER — ALUM & MAG HYDROXIDE-SIMETH 200-200-20 MG/5ML PO SUSP
30.0000 mL | Freq: Once | ORAL | Status: AC
  Administered 2023-07-04: 30 mL via ORAL
  Filled 2023-07-04: qty 30

## 2023-07-04 MED ORDER — ENOXAPARIN SODIUM 40 MG/0.4ML IJ SOSY
40.0000 mg | PREFILLED_SYRINGE | INTRAMUSCULAR | Status: DC
  Administered 2023-07-04 – 2023-07-05 (×2): 40 mg via SUBCUTANEOUS
  Filled 2023-07-04 (×2): qty 0.4

## 2023-07-04 MED ORDER — POTASSIUM CHLORIDE CRYS ER 20 MEQ PO TBCR
40.0000 meq | EXTENDED_RELEASE_TABLET | ORAL | Status: AC
  Administered 2023-07-04: 40 meq via ORAL
  Filled 2023-07-04: qty 2

## 2023-07-04 MED ORDER — TIOTROPIUM BROMIDE MONOHYDRATE 18 MCG IN CAPS: 18.0000 ug | ORAL_CAPSULE | Freq: Every day | RESPIRATORY_TRACT | Status: DC

## 2023-07-04 MED ORDER — SODIUM CHLORIDE 0.9 % IV SOLN
500.0000 mg | INTRAVENOUS | Status: DC
  Administered 2023-07-04 – 2023-07-05 (×2): 500 mg via INTRAVENOUS
  Filled 2023-07-04 (×2): qty 5

## 2023-07-04 MED ORDER — MAGNESIUM SULFATE 2 GM/50ML IV SOLN
2.0000 g | Freq: Once | INTRAVENOUS | Status: AC
  Administered 2023-07-04: 2 g via INTRAVENOUS
  Filled 2023-07-04: qty 50

## 2023-07-04 MED ORDER — LACTATED RINGERS IV BOLUS
1000.0000 mL | Freq: Once | INTRAVENOUS | Status: AC
  Administered 2023-07-04: 1000 mL via INTRAVENOUS

## 2023-07-04 MED ORDER — PANTOPRAZOLE SODIUM 40 MG IV SOLR
40.0000 mg | INTRAVENOUS | Status: DC
  Administered 2023-07-04: 40 mg via INTRAVENOUS

## 2023-07-04 MED ORDER — PANTOPRAZOLE SODIUM 40 MG IV SOLR
40.0000 mg | Freq: Two times a day (BID) | INTRAVENOUS | Status: DC
  Administered 2023-07-05 – 2023-07-06 (×3): 40 mg via INTRAVENOUS
  Filled 2023-07-04 (×3): qty 10

## 2023-07-04 MED ORDER — MELATONIN 3 MG PO TABS
3.0000 mg | ORAL_TABLET | Freq: Every evening | ORAL | Status: DC | PRN
  Administered 2023-07-04: 3 mg via ORAL
  Filled 2023-07-04: qty 1

## 2023-07-04 NOTE — ED Triage Notes (Signed)
Pt c/o epigastric pain, indigestion, nauseax2d. Loss of [id number]. Pt has lost 8 lbs in last 3 wks. Pt c/o vomitingx2d.

## 2023-07-04 NOTE — Procedures (Signed)
Insertion of Chest Tube Procedure Note  KASYN BRAU  [id number]  1956/05/13  Date:07/04/23  Time:6:56 PM    Provider Performing: Lorin Glass   Procedure: Pleural Catheter Insertion w/ Imaging Guidance ([id number])  Indication(s) Effusion  Consent Verbal  Anesthesia Topical only with 1% lidocaine    Time Out Verified patient identification, verified procedure, site/side was marked, verified correct patient position, special equipment/implants available, medications/allergies/relevant history reviewed, required imaging and test results available.   Sterile Technique Maximal sterile technique including full sterile barrier drape, hand hygiene, sterile gown, sterile gloves, mask, hair covering, sterile ultrasound probe cover (if used).   Procedure Description Ultrasound used to identify appropriate pleural anatomy for placement and overlying skin marked. Area of placement cleaned and draped in sterile fashion.  A 14 French pigtail pleural catheter was placed into the right pleural space using Seldinger technique. Appropriate return of fluid was obtained.  The tube was connected to atrium and placed on -20 cm H2O wall suction.   Complications/Tolerance None; patient tolerated the procedure well. Chest X-ray is ordered to verify placement.   EBL Minimal  Specimen(s) fluid

## 2023-07-04 NOTE — Progress Notes (Signed)
Consultation  Referring Provider:  [facility]  Primary Care Physician:  Shirline Frees, NP Primary Gastroenterologist:  Dr. Leonides Schanz       Reason for Consultation:     Epigastric pain, early satiety, weight loss  LOS: 0 days          HPI:   Chase Lloyd is a 67 y.o. male with past medical history significant for COPD on 2L [place] oxygen, GERD, Hodgkin's disease s/p mantle field radiation 1999 now in remission, CAD, PAD, presents for evaluation of epigastric pain and early satiety  Seen 09/2022 by Dr. Leonides Schanz and underwent EGD for dysphagia (see below) that was overall unrevealing and colonoscopy for screening.  Patient is followed at [facility] as he is currently being considered for lung transplant for his severe COPD.  He has had several COPD flareups over the last few months requiring antibiotics and steroids.  CTA abdomen pelvis -No explanation of postprandial pain - Chronic occlusion of right superficial femoral artery - Large right pleural effusion with pleural thickening/enhancement.  Progressive right hemithorax compared to 02/2023.  Neoplasm not excluded. - Upper normal para-aortic lymph nodes (new).  Upper normal lymph nodes in hepatoduodenal ligament.  Nonspecific and may be reactive  Lipase 18 CMP with hypokalemia 2.9 Hgb 10.7 (stable) No leukocytosis     PREVIOUS GI WORKUP   EGD 01/15/2023 for dysphagia - White nummular lesions in esophageal mucosa - Erythematous mucosa in antrum - Duodenitis  Colonoscopy 01/14/2023 for screening - Four 3 to 7 mm polyps in transverse colon.  Resected and retrieved. - Nonbleeding internal hemorrhoids  FINAL MICROSCOPIC DIAGNOSIS:  A. STOMACH, BIOPSY:  - Gastric antral mucosa with mild nonspecific reactive gastropathy  - Gastric oxyntic mucosa with parietal cell hyperplasia as can be seen  in hypergastrinemic states such as PPI therapy.  - Helicobacter pylori-like organisms are not identified on routine HE  stain   B. ESOPHAGUS, BIOPSY:   - Esophageal squamous mucosa with no specific histopathologic changes  - Negative for increased intraepithelial eosinophils   C. COLON, TRANSVERSE, POLYPECTOMY:  - Tubular adenoma(s)  - Negative for high-grade dysplasia or malignancy    Past Medical History:  Diagnosis Date   Arthritis    Back pain    COPD (chronic obstructive pulmonary disease) (HCC)    Coronary artery disease    COVID    mild case   DDD (degenerative disc disease), lumbar    Depression    Dyspnea    uses O2 as needed   Emphysema of lung (HCC)    Empyema (HCC)    2016  (was in [place])   GERD (gastroesophageal reflux disease)    Hearing loss of right ear    started 2001.     microphone in right ear ,  and hearing aid in left   Hodgkin's disease (HCC) 1999   Hypotension    Meniere's disease    PAD (peripheral artery disease) (HCC)    Pneumonia    Tinnitus     Surgical History:  He  has a past surgical history that includes Laminectomy (1999); Cervical fusion (2016); lower back surgery; Elbow surgery; Back surgery; Tonsillectomy; Eye surgery; IR THORACENTESIS ASP PLEURAL SPACE W/IMG GUIDE (10/08/2018); RIGHT/LEFT HEART CATH AND CORONARY ANGIOGRAPHY (N/A, 04/29/2019); Esophagogastroduodenoscopy (egd) with propofol (N/A, 01/15/2023); Colonoscopy with propofol (N/A, 01/15/2023); biopsy (01/15/2023); polypectomy (01/15/2023); ABDOMINAL AORTOGRAM W/LOWER EXTREMITY (Bilateral, 02/04/2023); PERIPHERAL VASCULAR INTERVENTION (Left, 02/04/2023); and Endarterectomy femoral (Right, 02/11/2023). Family History:  His family history includes  Diabetes in his father; Hypertension in his father; Melanoma in his brother; Testicular cancer in his brother. Social History:   reports that he quit smoking about 5 years ago. His smoking use included cigarettes. He started smoking about 52 years ago. He has a 70.5 pack-year smoking history. He has never used smokeless tobacco. He reports current alcohol use. He reports that he does not use  drugs.  Prior to Admission medications   Medication Sig Start Date End Date Taking? Authorizing Provider  acetaminophen (TYLENOL) 500 MG tablet Take 1,000 mg by mouth every 6 (six) hours as needed for mild pain.    [provider]  albuterol (PROVENTIL) (2.5 MG/3ML) 0.083% nebulizer solution Take 3 mLs (2.5 mg total) by nebulization every 4 (four) hours as needed for wheezing or shortness of breath. 06/11/23   Luciano Cutter, MD  albuterol (VENTOLIN HFA) 108 (90 Base) MCG/ACT inhaler Inhale 2 puffs into the lungs every 6 (six) hours as needed for wheezing or shortness of breath. 12/22/22   Luciano Cutter, MD  aspirin EC 81 MG tablet Take 1 tablet (81 mg total) by mouth daily. Swallow whole. 02/05/23 02/05/24  Marcelino Duster, PA  fluticasone-salmeterol (ADVAIR HFA) [phone] MCG/ACT inhaler Inhale 2 puffs into the lungs 2 (two) times daily. 06/11/23   Luciano Cutter, MD  furosemide (LASIX) 20 MG tablet Take 1 tablet (20 mg total) by mouth as needed. Take 1 tablet (20mg ) by mouth as needed for swelling 05/29/23   Sharlene Dory, PA-C  ibuprofen (ADVIL) 800 MG tablet Take 800 mg by mouth every 8 (eight) hours as needed for moderate pain. 08/08/20   [provider]  isosorbide mononitrate (IMDUR) 30 MG 24 hr tablet Take 1 tablet (30 mg total) by mouth in the morning. 05/29/23   Sharlene Dory, PA-C  metoprolol succinate (TOPROL-XL) 25 MG 24 hr tablet Take 1 tablet (25 mg total) by mouth daily. 10/16/22   Lyn Records, MD  omeprazole (PRILOSEC) 40 MG capsule Take 40 mg by mouth daily.    [provider]  OXYGEN Inhale 2-4 L into the lungs continuous.    [provider]  predniSONE (DELTASONE) 20 MG tablet Take 2 tablets (40 mg total) by mouth daily with breakfast. 05/29/23   Charlott Holler, MD  rosuvastatin (CRESTOR) 40 MG tablet Take 1 tablet (40 mg total) by mouth daily. 02/05/23   [facility], Roe Rutherford, PA  tiotropium (SPIRIVA HANDIHALER) 18 MCG inhalation capsule  Place 1 capsule (18 mcg total) into inhaler and inhale daily. 06/11/23   Luciano Cutter, MD    Current Facility-Administered Medications  Medication Dose Route Frequency Provider Last Rate Last Admin   magnesium sulfate IVPB 2 g 50 mL  2 g Intravenous Once Derwood Kaplan, MD       Current Outpatient Medications  Medication Sig Dispense Refill   acetaminophen (TYLENOL) 500 MG tablet Take 1,000 mg by mouth every 6 (six) hours as needed for mild pain.     albuterol (PROVENTIL) (2.5 MG/3ML) 0.083% nebulizer solution Take 3 mLs (2.5 mg total) by nebulization every 4 (four) hours as needed for wheezing or shortness of breath. 75 mL 12   albuterol (VENTOLIN HFA) 108 (90 Base) MCG/ACT inhaler Inhale 2 puffs into the lungs every 6 (six) hours as needed for wheezing or shortness of breath. 8 g 6   aspirin EC 81 MG tablet Take 1 tablet (81 mg total) by mouth daily. Swallow whole. 150 tablet 2  fluticasone-salmeterol (ADVAIR HFA) 230-21 MCG/ACT inhaler Inhale 2 puffs into the lungs 2 (two) times daily. 12 g 5   furosemide (LASIX) 20 MG tablet Take 1 tablet (20 mg total) by mouth as needed. Take 1 tablet (20mg ) by mouth as needed for swelling 25 tablet 3   ibuprofen (ADVIL) 800 MG tablet Take 800 mg by mouth every 8 (eight) hours as needed for moderate pain.     isosorbide mononitrate (IMDUR) 30 MG 24 hr tablet Take 1 tablet (30 mg total) by mouth in the morning. 90 tablet 3   metoprolol succinate (TOPROL-XL) 25 MG 24 hr tablet Take 1 tablet (25 mg total) by mouth daily. 90 tablet 3   omeprazole (PRILOSEC) 40 MG capsule Take 40 mg by mouth daily.     OXYGEN Inhale 2-4 L into the lungs continuous.     predniSONE (DELTASONE) 20 MG tablet Take 2 tablets (40 mg total) by mouth daily with breakfast. 10 tablet 0   rosuvastatin (CRESTOR) 40 MG tablet Take 1 tablet (40 mg total) by mouth daily. 90 tablet 3   tiotropium (SPIRIVA HANDIHALER) 18 MCG inhalation capsule Place 1 capsule (18 mcg total) into inhaler and  inhale daily. 90 capsule 2    Allergies as of 07/04/2023   (No Known Allergies)    ROS     Physical Exam:  Vital signs in last 24 hours: Temp:  [98 F (36.7 C)] 98 F (36.7 C) (08/31 0949) Pulse Rate:  [101-119] 104 (08/31 1200) Resp:  [13-22] 13 (08/31 1200) BP: (139-151)/(45-76) 139/45 (08/31 1200) SpO2:  [97 %-100 %] 99 % (08/31 1200) Weight:  [74.4 kg] 74.4 kg (08/31 0958)   Last BM recorded by nurses in past 5 days No data recorded  Physical Exam   LAB RESULTS: Recent Labs    07/04/23 1006  WBC 7.7  HGB 10.7*  HCT 33.4*  PLT 152   BMET Recent Labs    07/04/23 1006  NA 135  K 2.9*  CL 94*  CO2 32  GLUCOSE 120*  BUN 9  CREATININE 1.13  CALCIUM 9.1   LFT Recent Labs    07/04/23 1006  PROT 6.8  ALBUMIN 2.7*  AST 19  ALT 21  ALKPHOS 73  BILITOT 1.1   PT/INR No results for input(s): "LABPROT", "INR" in the last 72 hours.  STUDIES: CT Angio Abd/Pel W and/or Wo Contrast  Result Date: 07/04/2023 CLINICAL DATA:  Postprandial pain. EXAM: CTA ABDOMEN AND PELVIS WITHOUT AND WITH CONTRAST TECHNIQUE: Multidetector CT imaging of the abdomen and pelvis was performed using the standard protocol during bolus administration of intravenous contrast. Multiplanar reconstructed images and MIPs were obtained and reviewed to evaluate the vascular anatomy. RADIATION DOSE REDUCTION: This exam was performed according to the departmental dose-optimization program which includes automated exposure control, adjustment of the mA and/or kV according to patient size and/or use of iterative reconstruction technique. CONTRAST:  OMNIPAQUE IOHEXOL 350 MG/ML SOLN COMPARISON:  Chest CTA 02/07/2023 abdomen and pelvis CT 02/14/2019 FINDINGS: VASCULAR Aorta: Normal caliber aorta without aneurysm, dissection, vasculitis or significant stenosis. Moderate atherosclerosis. Celiac: Patent without evidence of aneurysm, dissection, vasculitis or significant stenosis. SMA: Patent without  evidence of aneurysm, dissection, vasculitis or significant stenosis. Renals: Both renal arteries are patent without evidence of aneurysm, dissection, vasculitis, fibromuscular dysplasia or significant stenosis. IMA: Patent without evidence of aneurysm, dissection, vasculitis or significant stenosis. Inflow: Patent without evidence of aneurysm, dissection, vasculitis or significant stenosis. Proximal Outflow: Potential flow limiting stenosis of the  proximal right external iliac artery. Apparent chronic occlusion right superficial femoral artery. Veins: No obvious venous abnormality within the limitations of this arterial phase study. Review of the MIP images confirms the above findings. NON-VASCULAR Lower chest: Advanced changes of emphysema noted in the lung bases with patchy consolidative airspace disease in the lingula and left lower lobe. There is collapse noted in the right lower lobe with a large right pleural effusion associated with right pleural thickening/enhancement. Changes in the thorax are progressive comparing to 02/07/2023. Hepatobiliary: No suspicious focal abnormality within the liver parenchyma. Gallbladder is nondistended. No intrahepatic or extrahepatic biliary dilation. Pancreas: No focal mass lesion. No dilatation of the main duct. No intraparenchymal cyst. No peripancreatic edema. Spleen: Small hypoenhancing lesion in the dome of the spleen is stable since 2020 consistent with benign etiology such as hemangioma. Adrenals/Urinary Tract: No adrenal nodule or mass. Small simple cyst noted upper pole right kidney left kidney unremarkable. No evidence for hydroureter. The urinary bladder appears normal for the degree of distention. Stomach/Bowel: Stomach is unremarkable. No evidence of outlet obstruction. Apparent wall thickening in the proximal stomach likely secondary to underdistention. Duodenum is normally positioned as is the ligament of Treitz. No small bowel wall thickening. No small bowel  dilatation. The terminal ileum is normal. The appendix is not well visualized, but there is no edema or inflammation in the region of the cecal tip to suggest appendicitis. No gross colonic mass. No colonic wall thickening. Lymphatic: Upper normal para-aortic lymph nodes are new since 2020, measuring up to 10 mm short axis. Upper normal lymph nodes are seen in the hepatoduodenal ligament is well. 11 mm short axis retrocaval node is visible on 92/5. No pelvic sidewall lymphadenopathy. Reproductive: The prostate gland and seminal vesicles are unremarkable. Other: No intraperitoneal free fluid. Musculoskeletal: Status post lumbar fusion. No worrisome lytic or sclerotic osseous abnormality. IMPRESSION: 1. No acute findings in the abdomen or pelvis. Specifically, no evidence for mesenteric arterial abnormality to explain the patient's history of postprandial pain. 2. Potential flow limiting stenosis of the proximal right external iliac artery. Apparent chronic occlusion right superficial femoral artery. 3. Large right pleural effusion with right pleural thickening/enhancement. Changes in the right hemithorax are progressive comparing to 02/07/2023. Close follow-up recommended as neoplasm is not excluded. 4. Patchy airspace disease in the lingula and left lower lobe compatible with multifocal pneumonia. 5. Upper normal para-aortic lymph nodes are new since 2020, measuring up to 10 mm short axis. Upper normal lymph nodes are seen in the hepatoduodenal ligament is well. 11 mm short axis retrocaval node is visible on 92/5. These are nonspecific and may be reactive. Follow-up CT in 3 months recommended to ensure stability. 6. Aortic Atherosclerosis (ICD10-I70.0) and Emphysema (ICD10-J43.9). Electronically Signed   By: Kennith Center M.D.   On: 07/04/2023 15:31      Impression    67 y.o. male with past medical history significant for COPD on 2L [place] oxygen being evaluated by [facility] for consideration of lung transplant, GERD,  Hodgkin's disease s/p mantle field radiation 1999 now in remission, CAD, PAD, presents for evaluation of epigastric pain and early satiety. Recent EGD 01/2023 for dysphagia showed duodenitis.  Early satiety Epigastric pain -- CTA ab/pelvis with no etiology for current symptoms. Reveals large R pleural effusion, R hemithorax, and reactive lymph nodes. -- Lipase 18 -- hgb 10.7, stable -- no leukocytosis  Active Problems:   * No active hospital problems. *      Plan   ***  Thank you for your kind consultation, we will continue to follow.   Katarzyna Wolven Leanna Sato  07/04/2023, 3:47 PM

## 2023-07-04 NOTE — ED Provider Notes (Signed)
[place] [facility] Provider Note   CSN: [id number] Arrival date & time: 07/04/23  8657     History  Chief Complaint  Patient presents with   Abdominal Pain    LYRIK DICKOW is a 67 y.o. male.  HPI     67 year old male comes in with chief complaint of abdominal pain.  Patient complains of epigastric abdominal pain, indigestion and nausea.  He indicates that has been feeling unwell for the last several months.  Patient however has noticed that over the last 2 days he has been having bloating feeling that is more severe.  He also has lost appetite for the last several days, but over the last 2 days he has been dry heaving when he tries to eat or drink.  Patient had upper and lower endoscopy done earlier this year which were reassuring.  Patient has past medical history pertinent for COPD and heavy pack-year history.  Patient stopped smoking in 2019.  Home Medications Prior to Admission medications   Medication Sig Start Date End Date Taking? Authorizing Provider  acetaminophen (TYLENOL) 500 MG tablet Take 1,000 mg by mouth every 6 (six) hours as needed for mild pain.    [provider]  albuterol (PROVENTIL) (2.5 MG/3ML) 0.083% nebulizer solution Take 3 mLs (2.5 mg total) by nebulization every 4 (four) hours as needed for wheezing or shortness of breath. 06/11/23   Luciano Cutter, MD  albuterol (VENTOLIN HFA) 108 (90 Base) MCG/ACT inhaler Inhale 2 puffs into the lungs every 6 (six) hours as needed for wheezing or shortness of breath. 12/22/22   Luciano Cutter, MD  aspirin EC 81 MG tablet Take 1 tablet (81 mg total) by mouth daily. Swallow whole. 02/05/23 02/05/24  Marcelino Duster, PA  fluticasone-salmeterol (ADVAIR HFA) [phone] MCG/ACT inhaler Inhale 2 puffs into the lungs 2 (two) times daily. 06/11/23   Luciano Cutter, MD  furosemide (LASIX) 20 MG tablet Take 1 tablet (20 mg total) by mouth as needed. Take 1 tablet (20mg ) by mouth as  needed for swelling 05/29/23   Sharlene Dory, PA-C  ibuprofen (ADVIL) 800 MG tablet Take 800 mg by mouth every 8 (eight) hours as needed for moderate pain. 08/08/20   [provider]  isosorbide mononitrate (IMDUR) 30 MG 24 hr tablet Take 1 tablet (30 mg total) by mouth in the morning. 05/29/23   Sharlene Dory, PA-C  metoprolol succinate (TOPROL-XL) 25 MG 24 hr tablet Take 1 tablet (25 mg total) by mouth daily. 10/16/22   Lyn Records, MD  omeprazole (PRILOSEC) 40 MG capsule Take 40 mg by mouth daily.    [provider]  OXYGEN Inhale 2-4 L into the lungs continuous.    [provider]  predniSONE (DELTASONE) 20 MG tablet Take 2 tablets (40 mg total) by mouth daily with breakfast. 05/29/23   Charlott Holler, MD  rosuvastatin (CRESTOR) 40 MG tablet Take 1 tablet (40 mg total) by mouth daily. 02/05/23   [facility], Roe Rutherford, PA  tiotropium (SPIRIVA HANDIHALER) 18 MCG inhalation capsule Place 1 capsule (18 mcg total) into inhaler and inhale daily. 06/11/23   Luciano Cutter, MD      Allergies    Patient has no known allergies.    Review of Systems   Review of Systems  All other systems reviewed and are negative.   Physical Exam Updated Vital Signs BP (!) 139/45   Pulse (!) 104   Temp 98  F (36.7 C) (Oral)   Resp 13   Ht 5\' 11"  (1.803 m)   Wt 74.4 kg   SpO2 99%   BMI 22.88 kg/m  Physical Exam Vitals and nursing note reviewed.  Constitutional:      Appearance: He is well-developed.  HENT:     Head: Atraumatic.  Cardiovascular:     Rate and Rhythm: Normal rate.  Pulmonary:     Effort: Pulmonary effort is normal.  Abdominal:     General: Abdomen is flat.     Tenderness: There is no abdominal tenderness.  Musculoskeletal:     Cervical back: Neck supple.  Skin:    General: Skin is warm.  Neurological:     Mental Status: He is alert and oriented to person, place, and time.     ED Results / Procedures / Treatments   Labs (all labs ordered are  listed, but only abnormal results are displayed) Labs Reviewed  COMPREHENSIVE METABOLIC PANEL - Abnormal; Notable for the following components:      Result Value   Potassium 2.9 (*)    Chloride 94 (*)    Glucose, Bld 120 (*)    Albumin 2.7 (*)    All other components within normal limits  CBC - Abnormal; Notable for the following components:   RBC 3.55 (*)    Hemoglobin 10.7 (*)    HCT 33.4 (*)    All other components within normal limits  URINALYSIS, ROUTINE W REFLEX MICROSCOPIC - Abnormal; Notable for the following components:   Color, Urine AMBER (*)    APPearance HAZY (*)    Protein, ur 100 (*)    All other components within normal limits  MAGNESIUM - Abnormal; Notable for the following components:   Magnesium 1.5 (*)    All other components within normal limits  LIPASE, BLOOD    EKG EKG Interpretation Date/Time:  Saturday July 04 2023 09:45:33 EDT Ventricular Rate:  115 PR Interval:  166 QRS Duration:  88 QT Interval:  310 QTC Calculation: 428 R Axis:   86  Text Interpretation: Sinus tachycardia Cannot rule out Inferior infarct , age undetermined Cannot rule out Anterior infarct , age undetermined Abnormal ECG When compared with ECG of 07-Feb-2023 17:49, PREVIOUS ECG IS PRESENT No acute changes No significant change since last tracing Confirmed by Derwood Kaplan ([id number]) on 07/04/2023 12:03:13 PM  Radiology CT Angio Abd/Pel W and/or Wo Contrast  Result Date: 07/04/2023 CLINICAL DATA:  Postprandial pain. EXAM: CTA ABDOMEN AND PELVIS WITHOUT AND WITH CONTRAST TECHNIQUE: Multidetector CT imaging of the abdomen and pelvis was performed using the standard protocol during bolus administration of intravenous contrast. Multiplanar reconstructed images and MIPs were obtained and reviewed to evaluate the vascular anatomy. RADIATION DOSE REDUCTION: This exam was performed according to the departmental dose-optimization program which includes automated exposure control, adjustment  of the mA and/or kV according to patient size and/or use of iterative reconstruction technique. CONTRAST:  OMNIPAQUE IOHEXOL 350 MG/ML SOLN COMPARISON:  Chest CTA 02/07/2023 abdomen and pelvis CT 02/14/2019 FINDINGS: VASCULAR Aorta: Normal caliber aorta without aneurysm, dissection, vasculitis or significant stenosis. Moderate atherosclerosis. Celiac: Patent without evidence of aneurysm, dissection, vasculitis or significant stenosis. SMA: Patent without evidence of aneurysm, dissection, vasculitis or significant stenosis. Renals: Both renal arteries are patent without evidence of aneurysm, dissection, vasculitis, fibromuscular dysplasia or significant stenosis. IMA: Patent without evidence of aneurysm, dissection, vasculitis or significant stenosis. Inflow: Patent without evidence of aneurysm, dissection, vasculitis or significant stenosis. Proximal Outflow: Potential flow  limiting stenosis of the proximal right external iliac artery. Apparent chronic occlusion right superficial femoral artery. Veins: No obvious venous abnormality within the limitations of this arterial phase study. Review of the MIP images confirms the above findings. NON-VASCULAR Lower chest: Advanced changes of emphysema noted in the lung bases with patchy consolidative airspace disease in the lingula and left lower lobe. There is collapse noted in the right lower lobe with a large right pleural effusion associated with right pleural thickening/enhancement. Changes in the thorax are progressive comparing to 02/07/2023. Hepatobiliary: No suspicious focal abnormality within the liver parenchyma. Gallbladder is nondistended. No intrahepatic or extrahepatic biliary dilation. Pancreas: No focal mass lesion. No dilatation of the main duct. No intraparenchymal cyst. No peripancreatic edema. Spleen: Small hypoenhancing lesion in the dome of the spleen is stable since 2020 consistent with benign etiology such as hemangioma. Adrenals/Urinary Tract:  No adrenal nodule or mass. Small simple cyst noted upper pole right kidney left kidney unremarkable. No evidence for hydroureter. The urinary bladder appears normal for the degree of distention. Stomach/Bowel: Stomach is unremarkable. No evidence of outlet obstruction. Apparent wall thickening in the proximal stomach likely secondary to underdistention. Duodenum is normally positioned as is the ligament of Treitz. No small bowel wall thickening. No small bowel dilatation. The terminal ileum is normal. The appendix is not well visualized, but there is no edema or inflammation in the region of the cecal tip to suggest appendicitis. No gross colonic mass. No colonic wall thickening. Lymphatic: Upper normal para-aortic lymph nodes are new since 2020, measuring up to 10 mm short axis. Upper normal lymph nodes are seen in the hepatoduodenal ligament is well. 11 mm short axis retrocaval node is visible on 92/5. No pelvic sidewall lymphadenopathy. Reproductive: The prostate gland and seminal vesicles are unremarkable. Other: No intraperitoneal free fluid. Musculoskeletal: Status post lumbar fusion. No worrisome lytic or sclerotic osseous abnormality. IMPRESSION: 1. No acute findings in the abdomen or pelvis. Specifically, no evidence for mesenteric arterial abnormality to explain the patient's history of postprandial pain. 2. Potential flow limiting stenosis of the proximal right external iliac artery. Apparent chronic occlusion right superficial femoral artery. 3. Large right pleural effusion with right pleural thickening/enhancement. Changes in the right hemithorax are progressive comparing to 02/07/2023. Close follow-up recommended as neoplasm is not excluded. 4. Patchy airspace disease in the lingula and left lower lobe compatible with multifocal pneumonia. 5. Upper normal para-aortic lymph nodes are new since 2020, measuring up to 10 mm short axis. Upper normal lymph nodes are seen in the hepatoduodenal ligament is  well. 11 mm short axis retrocaval node is visible on 92/5. These are nonspecific and may be reactive. Follow-up CT in 3 months recommended to ensure stability. 6. Aortic Atherosclerosis (ICD10-I70.0) and Emphysema (ICD10-J43.9). Electronically Signed   By: Kennith Center M.D.   On: 07/04/2023 15:31    Procedures Procedures    Medications Ordered in ED Medications  magnesium sulfate IVPB 2 g 50 mL (has no administration in time range)  haloperidol lactate (HALDOL) injection 5 mg (5 mg Intravenous Given 07/04/23 1222)  lactated ringers bolus 1,000 mL (0 mLs Intravenous Stopped 07/04/23 1407)  potassium chloride 10 mEq in 100 mL IVPB ( Intravenous Restarted 07/04/23 1407)  alum & mag hydroxide-simeth (MAALOX/MYLANTA) 200-200-20 MG/5ML suspension 30 mL (30 mLs Oral Given 07/04/23 1225)  iohexol (OMNIPAQUE) 350 MG/ML injection 100 mL (100 mLs Intravenous Contrast Given 07/04/23 1426)    ED Course/ Medical Decision Making/ A&P  Medical Decision Making Amount and/or Complexity of Data Reviewed Labs: ordered. Radiology: ordered. ECG/medicine tests: ordered.  Risk OTC drugs. Prescription drug management.   67 year old patient comes in with chief complaint of abdominal discomfort, bloating sensation, anorexia and nausea.  He has past medical history of advanced COPD on home oxygen, also has remote history of tobacco use disorder with significant pack-year history.  Patient currently not a smoker.  I have reviewed patient's records including previous upper and lower endoscopy.  He was found to have duodenitis.  I have also reviewed outpatient specialty notes.  Collateral history also provided by patient's wife.  Differential diagnosis for this patient includes pancreatitis, pancreatic mass, cholelithiasis, hepatobiliary pathology including mass, ileus, small obstruction, mesenteric ischemia, thrombosis, GI dysmotility.  Plan is to get CT angio abdomen and pelvis  with contrast to ensure there is no thrombosis. If that is not the case, then patient will be advised to follow-up with GI as an outpatient.  3:55 PM CT abdomen pelvis completed.  I independently interpreted patient CT scan.  There is no clear evidence of small bowel obstruction.  Per radiology, there is no evidence of concerning ischemia.  However, patient found to have right sided large pleural effusion. Patient had informed me that he is having difficulty doing any kind of work because of shortness of breath.  I think this pleural effusion is contributing to her shortness of breath.  He has remote history of lymphoma.  It would be best to admit him to the hospital to evaluate the pleural effusion.  Clinically he is does not have pneumonia.  No fevers, no cough.  We will not give him antibiotics.  Results of the ER workup discussed with the patient. I spoke with [facility] team.  They will also see the patient to see if there is any GI motility issues.  Final Clinical Impression(s) / ED Diagnoses Final diagnoses:  Pleural effusion  Acute hypokalemia  Hypomagnesemia    Rx / DC Orders ED Discharge Orders     None         Derwood Kaplan, MD 07/04/23 1555

## 2023-07-04 NOTE — Plan of Care (Signed)
°  Problem: Activity: Goal: Ability to tolerate increased activity will improve Outcome: Progressing   Problem: Clinical Measurements: Goal: Ability to maintain a body temperature in the normal range will improve Outcome: Progressing   Problem: Respiratory: Goal: Ability to maintain adequate ventilation will improve Outcome: Progressing   Problem: Education: Goal: Knowledge of General Education information will improve Description: Including pain rating scale, medication(s)/side effects and non-pharmacologic comfort measures Outcome: Progressing   Problem: Health Behavior/Discharge Planning: Goal: Ability to manage health-related needs will improve Outcome: Progressing   Problem: Clinical Measurements: Goal: Ability to maintain clinical measurements within normal limits will improve Outcome: Progressing

## 2023-07-04 NOTE — Progress Notes (Signed)
Pharmacy Antibiotic Note  Chase Lloyd is a 68 y.o. male for which pharmacy has been consulted for ampicillin-sulbactam dosing for  aspiration pna .  Estimated Creatinine Clearance: 65.8 mL/min (by C-G formula based on SCr of 1.13 mg/dL).  WBC 7.7; T 100.1; HR 105; RR 26  Plan: Unasyn 3g q6h Monitor WBC, fever, renal function, cultures De-escalate when able  Height: 5\' 11"  (180.3 cm) Weight: 74.4 kg (164 lb 0.4 oz) IBW/kg (Calculated) : 75.3  Temp (24hrs), Avg:99.1 F (37.3 C), Min:98 F (36.7 C), Max:100.1 F (37.8 C)  Recent Labs  Lab 07/04/23 1006  WBC 7.7  CREATININE 1.13    Estimated Creatinine Clearance: 65.8 mL/min (by C-G formula based on SCr of 1.13 mg/dL).    No Known Allergies  Microbiology results: Pending  Thank you for allowing pharmacy to be a part of this patient's care.  Delmar Landau, PharmD, BCPS 07/04/2023 6:50 PM ED Clinical Pharmacist -  [phone]

## 2023-07-04 NOTE — H&P (Signed)
History and Physical    Patient: Chase Lloyd NFA:[id number] DOB: 05-09-1956 DOA: 07/04/2023 DOS: the patient was seen and examined on 07/04/2023 PCP: Shirline Frees, NP  Patient coming from: Home  Chief Complaint:  Chief Complaint  Patient presents with   Abdominal Pain   HPI: Chase Lloyd is a 67 y.o. male with medical history significant of COPD/emphysema, chronic respiratory failure on 2 L of oxygen, PAD, Hodgkin's lymphoma, prior history of tobacco abuse, and GERD who presents with complaints of epigastric discomfort with nausea and vomiting over the last 2 to 3 days.  Patient reports even the site of food makes him feel like he wants to vomit.  His wife makes notes that he is not not been feeling well for months now and has lost weight..  Patient reports that he has had associated symptoms of increased shortness of breath, orthopnea, insomnia, and reports of diarrhea.  He had been needing to increase his oxygen at home due to his breathing.  He makes note of previously having had fluid drained off of his lung  years ago.  To his knowledge he has not had any blood in his stools, but he does not usually look.  Records note he had upper endoscopy for dysphagia and colonoscopy colon cancers [place] done 01/15/2023 with Dr. Leonides Schanz.  Findings included white nummular lesions in the esophageal mucosa which were biopsies, erythematous mucosa in the antrum which was biopsied, duodenitis, 4 sessile polyps in the transverse colon which were 3 to 7 mm which were removed, and nonbleeding internal hemorrhoids.      In the emergency department patient was noted to have temperature of 100.1 F with tachycardia and tachypnea meeting SIRS criteria.  O2 saturations are currently maintained on 2 L of nasal cannula oxygen.  Labs noted WBC 7.7, hemoglobin 10.7, potassium 2.9, magnesium 1.5, and albumin 2.7.  CT angiogram of the abdomen pelvis have been obtained which noted no acute abnormality or evidence of  mesenteric ischemia, but did note potential flow-limiting stenosis of the proximal right external iliac artery with chronic occlusion of the right superficial femoral artery, and large right pleural effusion with right pleural thickening/enhancement progressed from 02/07/2023, patchy airspace disease in the lingula and left lower lobe compatible with a multifocal pneumonia.  Patient had been ordered 20 mEq of potassium chloride IV, 2 g of magnesium sulfate, 1 L of lactated Ringer's, GI cocktail, and Haldol 5 mL IV  Review of Systems: As mentioned in the history of present illness. All other systems reviewed and are negative. Past Medical History:  Diagnosis Date   Arthritis    Back pain    COPD (chronic obstructive pulmonary disease) (HCC)    Coronary artery disease    COVID    mild case   DDD (degenerative disc disease), lumbar    Depression    Dyspnea    uses O2 as needed   Emphysema of lung (HCC)    Empyema (HCC)    2016  (was in [place])   GERD (gastroesophageal reflux disease)    Hearing loss of right ear    started 2001.     microphone in right ear ,  and hearing aid in left   Hodgkin's disease (HCC) 1999   Hypotension    Meniere's disease    PAD (peripheral artery disease) (HCC)    Pneumonia    Tinnitus    Past Surgical History:  Procedure Laterality Date   ABDOMINAL AORTOGRAM W/LOWER EXTREMITY Bilateral 02/04/2023  Procedure: ABDOMINAL AORTOGRAM W/LOWER EXTREMITY;  Surgeon: Iran Ouch, MD;  Location: [facility];  Service: Cardiovascular;  Laterality: Bilateral;   BACK SURGERY     BIOPSY  01/15/2023   Procedure: BIOPSY;  Surgeon: Imogene Burn, MD;  Location: [facility];  Service: Gastroenterology;;   CERVICAL FUSION  2016   COLONOSCOPY WITH PROPOFOL N/A 01/15/2023   Procedure: COLONOSCOPY WITH PROPOFOL;  Surgeon: Imogene Burn, MD;  Location: [facility];  Service: Gastroenterology;  Laterality: N/A;   ELBOW SURGERY     ENDARTERECTOMY FEMORAL Right  02/11/2023   Procedure: RIGHT ILIO-FEMORAL ENDARTERECTOMY WITH PATCH ANGIOPLASTY;  Surgeon: [name], MD;  Location: [facility];  Service: Vascular;  Laterality: Right;   ESOPHAGOGASTRODUODENOSCOPY (EGD) WITH PROPOFOL N/A 01/15/2023   Procedure: ESOPHAGOGASTRODUODENOSCOPY (EGD) WITH PROPOFOL;  Surgeon: Imogene Burn, MD;  Location: [facility];  Service: Gastroenterology;  Laterality: N/A;   EYE SURGERY     IR THORACENTESIS ASP PLEURAL SPACE W/IMG GUIDE  10/08/2018   LAMINECTOMY  1999   lower back surgery     PERIPHERAL VASCULAR INTERVENTION Left 02/04/2023   Procedure: PERIPHERAL VASCULAR INTERVENTION;  Surgeon: Iran Ouch, MD;  Location: [facility];  Service: Cardiovascular;  Laterality: Left;  left external iliac   POLYPECTOMY  01/15/2023   Procedure: POLYPECTOMY;  Surgeon: Imogene Burn, MD;  Location: [facility];  Service: Gastroenterology;;   RIGHT/LEFT HEART CATH AND CORONARY ANGIOGRAPHY N/A 04/29/2019   Procedure: RIGHT/LEFT HEART CATH AND CORONARY ANGIOGRAPHY;  Surgeon: Lennette Bihari, MD;  Location: [facility];  Service: Cardiovascular;  Laterality: N/A;   TONSILLECTOMY     Social History:  reports that he quit smoking about 5 years ago. His smoking use included cigarettes. He started smoking about 52 years ago. He has a 70.5 pack-year smoking history. He has never used smokeless tobacco. He reports current alcohol use. He reports that he does not use drugs.  No Known Allergies  Family History  Problem Relation Age of Onset   Hypertension Father    Diabetes Father    Testicular cancer Brother    Melanoma Brother     Prior to Admission medications   Medication Sig Start Date End Date Taking? Authorizing Provider  albuterol (PROVENTIL) (2.5 MG/3ML) 0.083% nebulizer solution Take 3 mLs (2.5 mg total) by nebulization every 4 (four) hours as needed for wheezing or shortness of breath. 06/11/23  Yes Luciano Cutter, MD  albuterol (VENTOLIN HFA) 108 (90  Base) MCG/ACT inhaler Inhale 2 puffs into the lungs every 6 (six) hours as needed for wheezing or shortness of breath. 12/22/22  Yes Luciano Cutter, MD  aspirin EC 81 MG tablet Take 1 tablet (81 mg total) by mouth daily. Swallow whole. 02/05/23 02/05/24 Yes Duke, Roe Rutherford, PA  fluticasone-salmeterol (ADVAIR HFA) 230-21 MCG/ACT inhaler Inhale 2 puffs into the lungs 2 (two) times daily. 06/11/23  Yes Luciano Cutter, MD  furosemide (LASIX) 20 MG tablet Take 1 tablet (20 mg total) by mouth as needed. Take 1 tablet (20mg ) by mouth as needed for swelling 05/29/23  Yes Conte, Tessa N, PA-C  ibuprofen (ADVIL) 800 MG tablet Take 800 mg by mouth every 8 (eight) hours as needed for moderate pain. 08/08/20  Yes [provider]  isosorbide mononitrate (IMDUR) 30 MG 24 hr tablet Take 1 tablet (30 mg total) by mouth in the morning. 05/29/23  Yes Conte, Tessa N, PA-C  metoprolol succinate (TOPROL-XL) 25 MG 24 hr tablet  Take 1 tablet (25 mg total) by mouth daily. 10/16/22  Yes Lyn Records, MD  omeprazole (PRILOSEC) 40 MG capsule Take 40 mg by mouth daily.   Yes [provider]  OXYGEN Inhale 2-4 L into the lungs continuous.   Yes [provider]  rosuvastatin (CRESTOR) 10 MG tablet Take 10 mg by mouth daily.   Yes [provider]  tiotropium (SPIRIVA HANDIHALER) 18 MCG inhalation capsule Place 1 capsule (18 mcg total) into inhaler and inhale daily. 06/11/23   Luciano Cutter, MD    Physical Exam: Vitals:   07/04/23 1200 07/04/23 1645 07/04/23 1701 07/04/23 1712  BP: (!) 139/45 (!) 127/52    Pulse: (!) 104 (!) 103    Resp: 13 (!) 26    Temp:    100.1 F (37.8 C)  TempSrc:   Oral Oral  SpO2: 99% 99%    Weight:      Height:        Constitutional: Elderly male who appears chronically ill Eyes: PERRL, lids and conjunctivae normal ENMT: Mucous membranes are moist.   Neck: normal, supple,  Respiratory: Decreased breath sounds noted in the right mid to lower lung field.   Breath sounds appreciated in the left lung field without any significant wheezes appreciated.  Patient currently on 3 L of oxygen. Cardiovascular: Regular rate and rhythm, no murmurs / rubs / gallops. No extremity edema. 2+ pedal pulses. No carotid bruits.  Abdomen: no tenderness, no masses palpated. No hepatosplenomegaly. Bowel sounds positive.  Musculoskeletal: no clubbing / cyanosis. No joint deformity upper and lower extremities. Good ROM, no contractures. Normal muscle tone.  Skin: no rashes, lesions, ulcers. No induration Neurologic: CN 2-12 grossly intact. Sensation intact, DTR normal. Strength 5/5 in all 4.  Psychiatric: Normal judgment and insight. Alert and oriented x 3. Normal mood.   Data Reviewed:  EKG revealed sinus tachycardia 115 bpm  Assessment and Plan:  SIRS/sepsis multifocal pneumonia Pleural effusion On admission patient was noted to be tachycardic and tachypneic meeting SIRS criteria, but also noted to have   temperature of 100.1 F.  CT scan of the abdomen pelvis noted large right-sided pleural effusion with right pleural thickening/enhancement progressed when compared to previous CT from 4/6 and concern for patchy airspace disease in the lingula and left lower lobe concerning for multifocal pneumonia.   -Admit to a medical telemetry bed -Incentive spirometry and flutter valve -Check blood cultures and pleural fluid culture -Check lactic acid, LDH, procalcitonin, and MRSA Screen -Start empiric antibiotics of Rocephin and azithromycin -Mucinex -Pulmonology consulted for possible thoracentesis.  Orders placed for analysis of fluid   COPD/emphysema Chronic chronic respiratory failure At baseline patient is on 2 L of nasal cannula oxygen due to his symptoms pleural effusion he had been requiring more oxygen than normal.  No significant wheezing noted on physical exam.  Worsening respiratory status likely related with fusion and/or pneumonia.  Patient in the process of  being evaluated for lung transplant at [facility]. -Continuous pulse oximetry with oxygen maintain O2 saturation greater than 92%  -Continue pharmacy substitution for home inhaler -Albuterol nebs as needed for shortness of breath/wheezing  Nausea and vomiting Patient reports nausea and vomiting over the last 2 to 3 days which she has not been able to eat eat or drink much of anything.  CT angiogram of the abdomen and pelvis did not note any concerns for mesenteric ischemia.  Patient had EGD in March that noted duodenitis, but biopsies did not show any  signs of H. pylori. -Aspiration precautions with elevation head of the bed -Clear liquid diet and advance as tolerated -Protonix IV -Antiemetics as needed  Hypokalemia Hypomagnesemia Acute.  Initial potassium 2.9 with magnesium 1.5.  Patient had been given 2 g of magnesium sulfate IV and 20 mEq of potassium chloride IV. -Given   potassium chloride 40 meq p.o. if able -Continue to monitor and replace as needed  Normocytic anemia Hemoglobin 10.7 which appears improved from prior.  No reports of bleeding to the patient's knowledge. -Continue to monitor  Heart failure with preserved ejection fraction Chronic.  Patient does not appear grossly fluid overloaded at this time.  Last echocardiogram noted EF to be 55 to 60% with grade 1 diastolic dysfunction back in 11/2022. -Strict I&Os and daily weights -Add on BNP  PAD/CAD Patient with history of left external iliac artery stenting by Dr. Kirke Corin in 4/3 and subsequent right iliofemoral endarterectomy with patch angioplasty by Dr. Juanetta Gosling 4/10. -Continue Crestor and aspirin   Essential hypertension -Continue patient's current medication regimen  GERD -Pharmacy substitution of Protonix changed to IV twice daily until patient able to tolerate p.o.  DVT prophylaxis: Lovenox Advance Care Planning:   Code Status: Full Code    Consults: Pulmonology  Family Communication: Wife updated at  bedside  Severity of Illness: The appropriate patient status for this patient is INPATIENT. Inpatient status is judged to be reasonable and necessary in order to provide the required intensity of service to ensure the patient's safety. The patient's presenting symptoms, physical exam findings, and initial radiographic and laboratory data in the context of their chronic comorbidities is felt to place them at high risk for further clinical deterioration. Furthermore, it is not anticipated that the patient will be medically stable for discharge from the hospital within 2 midnights of admission.   * I certify that at the point of admission it is my clinical judgment that the patient will require inpatient hospital care spanning beyond 2 midnights from the point of admission due to high intensity of service, high risk for further deterioration and high frequency of surveillance required.*  Author: Clydie Braun, MD 07/04/2023 5:42 PM  For on call review [email or web address].

## 2023-07-04 NOTE — ED Notes (Signed)
ED TO INPATIENT HANDOFF REPORT  ED Nurse Name and Phone #: Khasir Woodrome [phone]  S Name/Age/Gender Chase Lloyd 67 y.o. male Room/Bed: 025C/025C  Code Status   Code Status: Full Code  Home/SNF/Other Home Patient oriented to: self, place, time, and situation Is this baseline? Yes   Triage Complete: Triage complete  Chief Complaint SIRS (systemic inflammatory response syndrome) (HCC) [R65.10]  Triage Note Pt c/o epigastric pain, indigestion, nauseax2d. Loss of appetitex78mos. Pt has lost 8 lbs in last 3 wks. Pt c/o vomitingx2d.    Allergies No Known Allergies  Level of Care/Admitting Diagnosis ED Disposition     ED Disposition  Admit   Condition  --   Comment  Hospital Area: [facility] [[id number]]  Level of Care: Telemetry Medical [104]  May admit patient to [facility] or [facility] if equivalent level of care is available:: No  Covid Evaluation: Asymptomatic - no recent exposure (last 10 days) testing not required  Diagnosis: SIRS (systemic inflammatory response syndrome) [facility]) [[id number]]  Admitting Physician: Clydie Braun [[id number]]  Attending Physician: Clydie Braun [[id number]]  Certification:: I certify this patient will need inpatient services for at least 2 midnights  Expected Medical Readiness: 07/06/2023          B Medical/Surgery History Past Medical History:  Diagnosis Date   Arthritis    Back pain    COPD (chronic obstructive pulmonary disease) (HCC)    Coronary artery disease    COVID    mild case   DDD (degenerative disc disease), lumbar    Depression    Dyspnea    uses O2 as needed   Emphysema of lung (HCC)    Empyema (HCC)    2016  (was in [place])   GERD (gastroesophageal reflux disease)    Hearing loss of right ear    started 2001.     microphone in right ear ,  and hearing aid in left   Hodgkin's disease (HCC) 1999   Hypotension    Meniere's disease    PAD (peripheral artery disease) (HCC)    Pneumonia    Tinnitus     Past Surgical History:  Procedure Laterality Date   ABDOMINAL AORTOGRAM W/LOWER EXTREMITY Bilateral 02/04/2023   Procedure: ABDOMINAL AORTOGRAM W/LOWER EXTREMITY;  Surgeon: Iran Ouch, MD;  Location: [facility];  Service: Cardiovascular;  Laterality: Bilateral;   BACK SURGERY     BIOPSY  01/15/2023   Procedure: BIOPSY;  Surgeon: Imogene Burn, MD;  Location: [facility] ENDOSCOPY;  Service: Gastroenterology;;   CERVICAL FUSION  2016   COLONOSCOPY WITH PROPOFOL N/A 01/15/2023   Procedure: COLONOSCOPY WITH PROPOFOL;  Surgeon: Imogene Burn, MD;  Location: [facility]OS[facility];  Service: Gastroenterology;  Laterality: N/A;   ELBOW SURGERY     ENDARTERECTOMY FEMORAL Right 02/11/2023   Procedure: RIGHT ILIO-FEMORAL ENDARTERECTOMY WITH PATCH ANGIOPLASTY;  Surgeon: [name], MD;  Location: [facility] OR;  Service: Vascular;  Laterality: Right;   ESOPHAGOGASTRODUODENOSCOPY (EGD) WITH PROPOFOL N/A 01/15/2023   Procedure: ESOPHAGOGASTRODUODENOSCOPY (EGD) WITH PROPOFOL;  Surgeon: Imogene Burn, MD;  Location: [facility]OSCOPY;  Service: Gastroenterology;  Laterality: N/A;   EYE SURGERY     IR THORACENTESIS ASP PLEURAL SPACE W/IMG GUIDE  10/08/2018   LAMINECTOMY  1999   lower back surgery     PERIPHERAL VASCULAR INTERVENTION Left 02/04/2023   Procedure: PERIPHERAL VASCULAR INTERVENTION;  Surgeon: Iran Ouch, MD;  Location: [facility];  Service: Cardiovascular;  Laterality: Left;  left external iliac   POLYPECTOMY  01/15/2023   Procedure: POLYPECTOMY;  Surgeon: Imogene Burn, MD;  Location: [facility];  Service: Gastroenterology;;   RIGHT/LEFT HEART CATH AND CORONARY ANGIOGRAPHY N/A 04/29/2019   Procedure: RIGHT/LEFT HEART CATH AND CORONARY ANGIOGRAPHY;  Surgeon: Lennette Bihari, MD;  Location: [facility];  Service: Cardiovascular;  Laterality: N/A;   TONSILLECTOMY       A IV Location/Drains/Wounds Patient Lines/Drains/Airways Status     Active Line/Drains/Airways     Name  Placement date Placement time Site Days   Peripheral IV 07/04/23 20 G Right;Lateral Antecubital 07/04/23  1221  Antecubital  less than 1            Intake/Output Last 24 hours  Intake/Output Summary (Last 24 hours) at 07/04/2023 1811 Last data filed at 07/04/2023 1801 Gross per 24 hour  Intake 1250 ml  Output --  Net 1250 ml    Labs/Imaging Results for orders placed or performed during the hospital encounter of 07/04/23 (from the past 48 hour(s))  Urinalysis, Routine w reflex microscopic -Urine, Clean Catch     Status: Abnormal   Collection Time: 07/04/23 10:01 AM  Result Value Ref Range   Color, Urine AMBER (A) YELLOW    Comment: BIOCHEMICALS MAY BE AFFECTED BY COLOR   APPearance HAZY (A) CLEAR   Specific Gravity, Urine 1.023 1.005 - 1.030   pH 5.0 5.0 - 8.0   Glucose, UA NEGATIVE NEGATIVE mg/dL   Hgb urine dipstick NEGATIVE NEGATIVE   Bilirubin Urine NEGATIVE NEGATIVE   Ketones, ur NEGATIVE NEGATIVE mg/dL   Protein, ur 161 (A) NEGATIVE mg/dL   Nitrite NEGATIVE NEGATIVE   Leukocytes,Ua NEGATIVE NEGATIVE   RBC / HPF 0-5 0 - 5 RBC/hpf   WBC, UA 0-5 0 - 5 WBC/hpf   Bacteria, UA NONE SEEN NONE SEEN   Squamous Epithelial / HPF 0-5 0 - 5 /HPF   Mucus PRESENT    Hyaline Casts, UA PRESENT     Comment: Performed at [facility], [address] [place], [place] [address]  Lipase, blood     Status: None   Collection Time: 07/04/23 10:06 AM  Result Value Ref Range   Lipase 18 11 - 51 U/L    Comment: Performed at [facility], [address] [place], [place] [address]  Comprehensive metabolic panel     Status: Abnormal   Collection Time: 07/04/23 10:06 AM  Result Value Ref Range   Sodium 135 135 - 145 mmol/L   Potassium 2.9 (L) 3.5 - 5.1 mmol/L   Chloride 94 (L) 98 - 111 mmol/L   CO2 32 22 - 32 mmol/L   Glucose, Bld 120 (H) 70 - 99 mg/dL    Comment: Glucose reference range applies only to samples taken after fasting for at least 8 hours.   BUN 9 8 - 23 mg/dL    Creatinine, Ser 1.19 0.61 - 1.24 mg/dL   Calcium 9.1 8.9 - 14.7 mg/dL   Total Protein 6.8 6.5 - 8.1 g/dL   Albumin 2.7 (L) 3.5 - 5.0 g/dL   AST 19 15 - 41 U/L   ALT 21 0 - 44 U/L   Alkaline Phosphatase 73 38 - 126 U/L   Total Bilirubin 1.1 0.3 - 1.2 mg/dL   GFR, Estimated >82 >95 mL/min    Comment: (NOTE) Calculated using the CKD-EPI Creatinine Equation (2021)    Anion gap 9 5 - 15    Comment: Performed at  [facility], [address] [place], [place], [place] [address]  CBC     Status: Abnormal   Collection Time: 07/04/23 10:06 AM  Result Value Ref Range   WBC 7.7 4.0 - 10.5 K/uL   RBC 3.55 (L) 4.22 - 5.81 MIL/uL   Hemoglobin 10.7 (L) 13.0 - 17.0 g/dL   HCT 82.9 (L) 56.2 - 13.0 %   MCV 94.1 80.0 - 100.0 fL   MCH 30.1 26.0 - 34.0 pg   MCHC 32.0 30.0 - 36.0 g/dL   RDW 86.5 78.4 - 69.6 %   Platelets 152 150 - 400 K/uL   nRBC 0.0 0.0 - 0.2 %    Comment: Performed at [facility], [address] [place], [place] [address]  Magnesium     Status: Abnormal   Collection Time: 07/04/23 10:06 AM  Result Value Ref Range   Magnesium 1.5 (L) 1.7 - 2.4 mg/dL    Comment: Performed at [facility], [address] [place], [place] [address]   DG Chest 2 View  Result Date: 07/04/2023 CLINICAL DATA:  Epigastric pain pleural effusion.  Nausea. EXAM: CHEST - 2 VIEW COMPARISON:  02/11/2023 FINDINGS: Hyperinflation. Cervical spine fixation. Midline trachea. Mild cardiomegaly. Significantly worsened right-sided aeration, favored to be due to a combination of increased airspace disease and possible inferomedial loculated pleural fluid. Marked interstitial coarsening remains. Lateral left lung base airspace disease is new or progressive. IMPRESSION: Significantly worsened right greater than left lower lung aeration, likely due to progressive multifocal pneumonia, superimposed upon severe emphysema/chronic bronchitis. Within the inferior right hemithorax, portion of this opacification could  be secondary to loculated inferior medial pleural fluid. Consider further evaluation with CT. Electronically Signed   By: Jeronimo Greaves M.D.   On: 07/04/2023 17:16   CT Angio Abd/Pel W and/or Wo Contrast  Result Date: 07/04/2023 CLINICAL DATA:  Postprandial pain. EXAM: CTA ABDOMEN AND PELVIS WITHOUT AND WITH CONTRAST TECHNIQUE: Multidetector CT imaging of the abdomen and pelvis was performed using the standard protocol during bolus administration of intravenous contrast. Multiplanar reconstructed images and MIPs were obtained and reviewed to evaluate the vascular anatomy. RADIATION DOSE REDUCTION: This exam was performed according to the departmental dose-optimization program which includes automated exposure control, adjustment of the mA and/or kV according to patient size and/or use of iterative reconstruction technique. CONTRAST:  OMNIPAQUE IOHEXOL 350 MG/ML SOLN COMPARISON:  Chest CTA 02/07/2023 abdomen and pelvis CT 02/14/2019 FINDINGS: VASCULAR Aorta: Normal caliber aorta without aneurysm, dissection, vasculitis or significant stenosis. Moderate atherosclerosis. Celiac: Patent without evidence of aneurysm, dissection, vasculitis or significant stenosis. SMA: Patent without evidence of aneurysm, dissection, vasculitis or significant stenosis. Renals: Both renal arteries are patent without evidence of aneurysm, dissection, vasculitis, fibromuscular dysplasia or significant stenosis. IMA: Patent without evidence of aneurysm, dissection, vasculitis or significant stenosis. Inflow: Patent without evidence of aneurysm, dissection, vasculitis or significant stenosis. Proximal Outflow: Potential flow limiting stenosis of the proximal right external iliac artery. Apparent chronic occlusion right superficial femoral artery. Veins: No obvious venous abnormality within the limitations of this arterial phase study. Review of the MIP images confirms the above findings. NON-VASCULAR Lower chest: Advanced changes of  emphysema noted in the lung bases with patchy consolidative airspace disease in the lingula and left lower lobe. There is collapse noted in the right lower lobe with a large right pleural effusion associated with right pleural thickening/enhancement. Changes in the thorax are progressive comparing to 02/07/2023. Hepatobiliary: No suspicious focal abnormality within the  liver parenchyma. Gallbladder is nondistended. No intrahepatic or extrahepatic biliary dilation. Pancreas: No focal mass lesion. No dilatation of the main duct. No intraparenchymal cyst. No peripancreatic edema. Spleen: Small hypoenhancing lesion in the dome of the spleen is stable since 2020 consistent with benign etiology such as hemangioma. Adrenals/Urinary Tract: No adrenal nodule or mass. Small simple cyst noted upper pole right kidney left kidney unremarkable. No evidence for hydroureter. The urinary bladder appears normal for the degree of distention. Stomach/Bowel: Stomach is unremarkable. No evidence of outlet obstruction. Apparent wall thickening in the proximal stomach likely secondary to underdistention. Duodenum is normally positioned as is the ligament of Treitz. No small bowel wall thickening. No small bowel dilatation. The terminal ileum is normal. The appendix is not well visualized, but there is no edema or inflammation in the region of the cecal tip to suggest appendicitis. No gross colonic mass. No colonic wall thickening. Lymphatic: Upper normal para-aortic lymph nodes are new since 2020, measuring up to 10 mm short axis. Upper normal lymph nodes are seen in the hepatoduodenal ligament is well. 11 mm short axis retrocaval node is visible on 92/5. No pelvic sidewall lymphadenopathy. Reproductive: The prostate gland and seminal vesicles are unremarkable. Other: No intraperitoneal free fluid. Musculoskeletal: Status post lumbar fusion. No worrisome lytic or sclerotic osseous abnormality. IMPRESSION: 1. No acute findings in the  abdomen or pelvis. Specifically, no evidence for mesenteric arterial abnormality to explain the patient's history of postprandial pain. 2. Potential flow limiting stenosis of the proximal right external iliac artery. Apparent chronic occlusion right superficial femoral artery. 3. Large right pleural effusion with right pleural thickening/enhancement. Changes in the right hemithorax are progressive comparing to 02/07/2023. Close follow-up recommended as neoplasm is not excluded. 4. Patchy airspace disease in the lingula and left lower lobe compatible with multifocal pneumonia. 5. Upper normal para-aortic lymph nodes are new since 2020, measuring up to 10 mm short axis. Upper normal lymph nodes are seen in the hepatoduodenal ligament is well. 11 mm short axis retrocaval node is visible on 92/5. These are nonspecific and may be reactive. Follow-up CT in 3 months recommended to ensure stability. 6. Aortic Atherosclerosis (ICD10-I70.0) and Emphysema (ICD10-J43.9). Electronically Signed   By: Kennith Center M.D.   On: 07/04/2023 15:31    Pending Labs Unresulted Labs (From admission, onward)     Start     Ordered   07/05/23 0500  CBC  Tomorrow morning,   R        07/04/23 1802   07/05/23 0500  Basic metabolic panel  Tomorrow morning,   R        07/04/23 1802   07/04/23 1806  MRSA Next Gen by PCR, Nasal  Once,   R        07/04/23 1805   07/04/23 1802  TSH  Add-on,   AD        07/04/23 1802   07/04/23 1800  Procalcitonin  Add-on,   AD       References:    Procalcitonin Lower Respiratory Tract Infection AND Sepsis Procalcitonin Algorithm   07/04/23 1802   07/04/23 1800  Legionella Pneumophila Serogp 1 Ur Ag  (COPD / Pneumonia / Cellulitis / Lower Extremity Wound)  Once,   R        07/04/23 1802   07/04/23 1800  Strep pneumoniae urinary antigen  (COPD / Pneumonia / Cellulitis / Lower Extremity Wound)  Once,   R        07/04/23 1802  07/04/23 1800  Culture, blood (routine x 2) Call MD if unable to obtain  prior to antibiotics being given  (COPD / Pneumonia / Cellulitis / Lower Extremity Wound)  BLOOD CULTURE X 2,   R (with TIMED occurrences)     Comments: If blood cultures drawn in Emergency Department - Do not draw and cancel order    07/04/23 1802   07/04/23 1800  Lactic acid, plasma  (Lactic Acid)  STAT Now then every 3 hours,   R (with STAT occurrences)      07/04/23 1802   07/04/23 1759  HIV Antibody (routine testing w rflx)  (HIV Antibody (Routine testing w reflex) panel)  Once,   R        07/04/23 1802            Vitals/Pain Today's Vitals   07/04/23 1645 07/04/23 1701 07/04/23 1712 07/04/23 1745  BP: (!) 127/52   133/60  Pulse: (!) 103   (!) 105  Resp: (!) 26     Temp:   100.1 F (37.8 C)   TempSrc:  Oral Oral   SpO2: 99%   100%  Weight:      Height:      PainSc:        Isolation Precautions No active isolations  Medications Medications  rosuvastatin (CRESTOR) tablet 10 mg (has no administration in time range)  metoprolol succinate (TOPROL-XL) 24 hr tablet 25 mg (has no administration in time range)  enoxaparin (LOVENOX) injection 40 mg (has no administration in time range)  sodium chloride flush (NS) 0.9 % injection 3 mL (has no administration in time range)  cefTRIAXone (ROCEPHIN) 2 g in sodium chloride 0.9 % 100 mL IVPB (has no administration in time range)  pantoprazole (PROTONIX) injection 40 mg (has no administration in time range)  acetaminophen (TYLENOL) tablet 650 mg (has no administration in time range)    Or  acetaminophen (TYLENOL) suppository 650 mg (has no administration in time range)  albuterol (PROVENTIL) (2.5 MG/3ML) 0.083% nebulizer solution 2.5 mg (has no administration in time range)  azithromycin (ZITHROMAX) 500 mg in sodium chloride 0.9 % 250 mL IVPB (has no administration in time range)  haloperidol lactate (HALDOL) injection 5 mg (5 mg Intravenous Given 07/04/23 1222)  lactated ringers bolus 1,000 mL (0 mLs Intravenous Stopped 07/04/23  1407)  potassium chloride 10 mEq in 100 mL IVPB (0 mEq Intravenous Stopped 07/04/23 1557)  alum & mag hydroxide-simeth (MAALOX/MYLANTA) 200-200-20 MG/5ML suspension 30 mL (30 mLs Oral Given 07/04/23 1225)  iohexol (OMNIPAQUE) 350 MG/ML injection 100 mL (100 mLs Intravenous Contrast Given 07/04/23 1426)  magnesium sulfate IVPB 2 g 50 mL (0 g Intravenous Stopped 07/04/23 1801)    Mobility walks     Focused Assessments Pulmonary Assessment Handoff:  Lung sounds:   O2 Device: Nasal Cannula O2 Flow Rate (L/min): 2 L/min    R Recommendations: See Admitting Provider Note  Report given to:   Additional Notes:

## 2023-07-04 NOTE — ED Provider Notes (Signed)
Signout; 67y/o with COPD on 2L with remote history of lymphoma presented with 3 month weight loss/fatigue with 2 weeks of post prandial N/V. Large pleural effusion on CT abdomen. K and mag low and repleted. Plan for CXR and admission for thoracentesis.   Physical Exam  BP (!) 127/52   Pulse (!) 103   Temp 100.1 F (37.8 C) (Oral)   Resp (!) 26   Ht 5\' 11"  (1.803 m)   Wt 74.4 kg   SpO2 99%   BMI 22.88 kg/m   Physical Exam Constitutional:      Appearance: He is obese.  HENT:     Head: Normocephalic.  Cardiovascular:     Rate and Rhythm: Normal rate.  Abdominal:     General: Abdomen is protuberant.     Tenderness: There is no abdominal tenderness.  Skin:    General: Skin is dry.  Neurological:     General: No focal deficit present.     Mental Status: He is alert.  Psychiatric:        Mood and Affect: Mood normal.        Behavior: Behavior normal.     Procedures  Procedures  ED Course / MDM    Medical Decision Making See Am doctors note for full HPI, admitting for large pleural effusion.   Amount and/or Complexity of Data Reviewed Labs: ordered. Radiology: ordered. ECG/medicine tests: ordered.  Risk OTC drugs. Prescription drug management. Decision regarding hospitalization.         Coral Spikes, DO 07/04/23 1739

## 2023-07-04 NOTE — Significant Event (Addendum)
[facility] Night coverage note:  Called by RN: initial results of pleural fluid are suggestive of probable high grade malignancy.  Cytology being sent out tomorrow.  Havent discussed with pt / family yet.

## 2023-07-04 NOTE — Consult Note (Signed)
NAME:  Chase Lloyd, MRN:  [id number], DOB:  1956/08/06, LOS: 0 ADMISSION DATE:  07/04/2023, CONSULTATION DATE: 07/04/2023 REFERRING MD:  Madelyn Flavors , CHIEF COMPLAINT: Poor appetite abdominal fullness  History of Present Illness:  67 year old male with Hodgkin lymphoma, COPD and chronic hypoxic respiratory failure on home oxygen who was brought into the emergency department with a complaint of nausea, vomiting and abdominal fullness and associated with shortness of breath happening for almost 6 to 8 weeks.  He stated that has not been felt well, he started losing weight and associated with increasing shortness of breath and orthopnea.  In the emergency department workup suggestive of large right-sided pleural effusion, PCCM was consulted for help evaluation medical management Patient denies fever, chills, dysuria, urgency, frequency or other complaints  Pertinent  Medical History   Past Medical History:  Diagnosis Date   Arthritis    Back pain    COPD (chronic obstructive pulmonary disease) (HCC)    Coronary artery disease    COVID    mild case   DDD (degenerative disc disease), lumbar    Depression    Dyspnea    uses O2 as needed   Emphysema of lung (HCC)    Empyema (HCC)    2016  (was in [place])   GERD (gastroesophageal reflux disease)    Hearing loss of right ear    started 2001.     microphone in right ear ,  and hearing aid in left   Hodgkin's disease (HCC) 1999   Hypotension    Meniere's disease    PAD (peripheral artery disease) (HCC)    Pneumonia    Tinnitus      Significant Hospital Events: Including procedures, antibiotic start and stop dates in addition to other pertinent events     Interim History / Subjective:  As above  Objective   Blood pressure 133/60, pulse (!) 105, temperature 100.1 F (37.8 C), temperature source Oral, resp. rate (!) 26, height 5\' 11"  (1.803 m), weight 74.4 kg, SpO2 100%.        Intake/Output Summary (Last 24 hours) at  07/04/2023 1833 Last data filed at 07/04/2023 1801 Gross per 24 hour  Intake 1250 ml  Output --  Net 1250 ml   Filed Weights   07/04/23 0958  Weight: 74.4 kg    Examination: General: Acute on absent air chronically ill-appearing male, lying on the bed HEENT: /AT, eyes anicteric.  moist mucus membranes Neuro: Alert, awake following commands Chest: Absent air entry on right lower lung zone, no wheezes or rhonchi Heart: Regular rate and rhythm, no murmurs or gallops Abdomen: Soft, nontender, nondistended, bowel sounds present Skin: No rash   Resolved Hospital Problem list     Assessment & Plan:  Bilateral multifocal pneumonia Large right-sided pleural effusion, probably parapneumonic versus malignant Hypokalemia/hypomagnesemia COPD Anemia of chronic disease  Switch antibiotic to Unasyn and azithromycin for anaerobic coverage Will place chest tube Patient may need imaging like CT scan of chest post drainage of pleural effusion Will send pleural effusion for cytology and for lights criteria Continue aggressive electrolyte replacement Hold steroid Continue nebs Monitor H&H  Best Practice (right click and "Reselect all SmartList Selections" daily)   Per family team   Labs   CBC: Recent Labs  Lab 07/04/23 1006  WBC 7.7  HGB 10.7*  HCT 33.4*  MCV 94.1  PLT 152    Basic Metabolic Panel: Recent Labs  Lab 07/04/23 1006  NA 135  K 2.9*  CL 94*  CO2 32  GLUCOSE 120*  BUN 9  CREATININE 1.13  CALCIUM 9.1  MG 1.5*   GFR: Estimated Creatinine Clearance: 65.8 mL/min (by C-G formula based on SCr of 1.13 mg/dL). Recent Labs  Lab 07/04/23 1006  WBC 7.7    Liver Function Tests: Recent Labs  Lab 07/04/23 1006  AST 19  ALT 21  ALKPHOS 73  BILITOT 1.1  PROT 6.8  ALBUMIN 2.7*   Recent Labs  Lab 07/04/23 1006  LIPASE 18   No results for input(s): "AMMONIA" in the last 168 hours.  ABG    Component Value Date/Time   PHART 7.403 02/11/2023 1330    PCO2ART 36.3 02/11/2023 1330   PO2ART 223 (H) 02/11/2023 1330   HCO3 22.8 02/11/2023 1330   TCO2 24 02/11/2023 1330   ACIDBASEDEF 2.0 02/11/2023 1330   O2SAT 100 02/11/2023 1330     Coagulation Profile: No results for input(s): "INR", "PROTIME" in the last 168 hours.  Cardiac Enzymes: No results for input(s): "CKTOTAL", "CKMB", "CKMBINDEX", "TROPONINI" in the last 168 hours.  HbA1C: No results found for: "HGBA1C"  CBG: No results for input(s): "GLUCAP" in the last 168 hours.  Review of Systems:   12 point review of systems significant for complaint mentioned HPI, rest negative  Past Medical History:  He,  has a past medical history of Arthritis, Back pain, COPD (chronic obstructive pulmonary disease) (HCC), Coronary artery disease, COVID, DDD (degenerative disc disease), lumbar, Depression, Dyspnea, Emphysema of lung (HCC), Empyema (HCC), GERD (gastroesophageal reflux disease), Hearing loss of right ear, Hodgkin's disease (HCC) (1999), Hypotension, Meniere's disease, PAD (peripheral artery disease) (HCC), Pneumonia, and Tinnitus.   Surgical History:   Past Surgical History:  Procedure Laterality Date   ABDOMINAL AORTOGRAM W/LOWER EXTREMITY Bilateral 02/04/2023   Procedure: ABDOMINAL AORTOGRAM W/LOWER EXTREMITY;  Surgeon: Iran Ouch, MD;  Location: [facility];  Service: Cardiovascular;  Laterality: Bilateral;   BACK SURGERY     BIOPSY  01/15/2023   Procedure: BIOPSY;  Surgeon: Imogene Burn, MD;  Location: [facility];  Service: Gastroenterology;;   CERVICAL FUSION  2016   COLONOSCOPY WITH PROPOFOL N/A 01/15/2023   Procedure: COLONOSCOPY WITH PROPOFOL;  Surgeon: Imogene Burn, MD;  Location: [facility];  Service: Gastroenterology;  Laterality: N/A;   ELBOW SURGERY     ENDARTERECTOMY FEMORAL Right 02/11/2023   Procedure: RIGHT ILIO-FEMORAL ENDARTERECTOMY WITH PATCH ANGIOPLASTY;  Surgeon: [name], MD;  [facility];  Service: Vascular;  Laterality:  Right;   ESOPHAGOGASTRODUODENOSCOPY (EGD) WITH PROPOFOL N/A 01/15/2023   Procedure: ESOPHAGOGASTRODUODENOSCOPY (EGD) WITH PROPOFOL;  Surgeon: Imogene Burn, MD;  Location: [facility];  Service: Gastroenterology;  Laterality: N/A;   EYE SURGERY     IR THORACENTESIS ASP PLEURAL SPACE W/IMG GUIDE  10/08/2018   LAMINECTOMY  1999   lower back surgery     PERIPHERAL VASCULAR INTERVENTION Left 02/04/2023   Procedure: PERIPHERAL VASCULAR INTERVENTION;  Surgeon: Iran Ouch, MD;  Location: [facility];  Service: Cardiovascular;  Laterality: Left;  left external iliac   POLYPECTOMY  01/15/2023   Procedure: POLYPECTOMY;  Surgeon: Imogene Burn, MD;  Location: [facility];  Service: Gastroenterology;;   RIGHT/LEFT HEART CATH AND CORONARY ANGIOGRAPHY N/A 04/29/2019   Procedure: RIGHT/LEFT HEART CATH AND CORONARY ANGIOGRAPHY;  Surgeon: Lennette Bihari, MD;  Location: [facility];  Service: Cardiovascular;  Laterality: N/A;   TONSILLECTOMY       Social History:   reports that  he quit smoking about 5 years ago. His smoking use included cigarettes. He started smoking about 52 years ago. He has a 70.5 pack-year smoking history. He has never used smokeless tobacco. He reports current alcohol use. He reports that he does not use drugs.   Family History:  His family history includes Diabetes in his father; Hypertension in his father; Melanoma in his brother; Testicular cancer in his brother.   Allergies No Known Allergies   Home Medications  Prior to Admission medications   Medication Sig Start Date End Date Taking? Authorizing Provider  albuterol (PROVENTIL) (2.5 MG/3ML) 0.083% nebulizer solution Take 3 mLs (2.5 mg total) by nebulization every 4 (four) hours as needed for wheezing or shortness of breath. 06/11/23  Yes Luciano Cutter, MD  albuterol (VENTOLIN HFA) 108 (90 Base) MCG/ACT inhaler Inhale 2 puffs into the lungs every 6 (six) hours as needed for wheezing or shortness of breath.  12/22/22  Yes Luciano Cutter, MD  aspirin EC 81 MG tablet Take 1 tablet (81 mg total) by mouth daily. Swallow whole. 02/05/23 02/05/24 Yes Duke, Roe Rutherford, PA  fluticasone-salmeterol (ADVAIR HFA) 230-21 MCG/ACT inhaler Inhale 2 puffs into the lungs 2 (two) times daily. 06/11/23  Yes Luciano Cutter, MD  furosemide (LASIX) 20 MG tablet Take 1 tablet (20 mg total) by mouth as needed. Take 1 tablet (20mg ) by mouth as needed for swelling 05/29/23  Yes Conte, Tessa N, PA-C  ibuprofen (ADVIL) 800 MG tablet Take 800 mg by mouth every 8 (eight) hours as needed for moderate pain. 08/08/20  Yes [provider]  isosorbide mononitrate (IMDUR) 30 MG 24 hr tablet Take 1 tablet (30 mg total) by mouth in the morning. 05/29/23  Yes Asa Lente, Tessa N, PA-C  metoprolol succinate (TOPROL-XL) 25 MG 24 hr tablet Take 1 tablet (25 mg total) by mouth daily. 10/16/22  Yes Lyn Records, MD  omeprazole (PRILOSEC) 40 MG capsule Take 40 mg by mouth daily.   Yes [provider]  OXYGEN Inhale 2-4 L into the lungs continuous.   Yes [provider]  rosuvastatin (CRESTOR) 10 MG tablet Take 10 mg by mouth daily.   Yes [provider]  tiotropium (SPIRIVA HANDIHALER) 18 MCG inhalation capsule Place 1 capsule (18 mcg total) into inhaler and inhale daily. 06/11/23   Luciano Cutter, MD       Cheri Fowler, MD [facility] See Amion for pager If no response to pager, please call [phone] until 7pm After 7pm, Please call E-link [phone]

## 2023-07-05 ENCOUNTER — Inpatient Hospital Stay (HOSPITAL_COMMUNITY): Payer: Medicare Other

## 2023-07-05 DIAGNOSIS — R6510 Systemic inflammatory response syndrome (SIRS) of non-infectious origin without acute organ dysfunction: Secondary | ICD-10-CM | POA: Diagnosis not present

## 2023-07-05 LAB — FOLATE: Folate: 5.6 ng/mL — ABNORMAL LOW (ref >5.9)

## 2023-07-05 LAB — BASIC METABOLIC PANEL
Anion gap: 10 (ref 5–15)
BUN: 7 mg/dL — ABNORMAL LOW (ref 8–23)
CO2: 26 mmol/L (ref 22–32)
Calcium: 8.4 mg/dL — ABNORMAL LOW (ref 8.9–10.3)
Chloride: 99 mmol/L (ref 98–111)
Creatinine, Ser: 0.94 mg/dL (ref 0.61–1.24)
GFR, Estimated: >60 mL/min (ref >60)
Glucose, Bld: 107 mg/dL — ABNORMAL HIGH (ref 70–99)
Potassium: 3.5 mmol/L (ref 3.5–5.1)
Sodium: 135 mmol/L (ref 135–145)

## 2023-07-05 LAB — CBC
HCT: 27.3 % — ABNORMAL LOW (ref 39.0–52.0)
Hemoglobin: 8.7 g/dL — ABNORMAL LOW (ref 13.0–17.0)
MCH: 28.8 pg (ref 26.0–34.0)
MCHC: 31.9 g/dL (ref 30.0–36.0)
MCV: 90.4 fL (ref 80.0–100.0)
Platelets: 125 10*3/uL — ABNORMAL LOW (ref 150–400)
RBC: 3.02 MIL/uL — ABNORMAL LOW (ref 4.22–5.81)
RDW: 13.4 % (ref 11.5–15.5)
WBC: 6.5 10*3/uL (ref 4.0–10.5)
nRBC: 0 % (ref 0.0–0.2)

## 2023-07-05 LAB — RETICULOCYTES
Immature Retic Fract: 10.3 % (ref 2.3–15.9)
RBC.: 2.98 MIL/uL — ABNORMAL LOW (ref 4.22–5.81)
Retic Count, Absolute: 31 10*3/uL (ref 19.0–186.0)
Retic Ct Pct: 1 % (ref 0.4–3.1)

## 2023-07-05 LAB — VITAMIN B12: Vitamin B-12: 277 pg/mL (ref 180–914)

## 2023-07-05 LAB — IRON AND TIBC
Iron: 18 ug/dL — ABNORMAL LOW (ref 45–182)
Saturation Ratios: 12 % — ABNORMAL LOW (ref 17.9–39.5)
TIBC: 157 ug/dL — ABNORMAL LOW (ref 250–450)
UIBC: 139 ug/dL

## 2023-07-05 LAB — T4, FREE: Free T4: 1.05 ng/dL (ref 0.61–1.12)

## 2023-07-05 LAB — MAGNESIUM: Magnesium: 1.9 mg/dL (ref 1.7–2.4)

## 2023-07-05 LAB — FERRITIN: Ferritin: 439 ng/mL — ABNORMAL HIGH (ref 24–336)

## 2023-07-05 MED ORDER — POTASSIUM CHLORIDE CRYS ER 20 MEQ PO TBCR
40.0000 meq | EXTENDED_RELEASE_TABLET | Freq: Once | ORAL | Status: AC
  Administered 2023-07-05: 40 meq via ORAL
  Filled 2023-07-05: qty 2

## 2023-07-05 MED ORDER — VANCOMYCIN HCL 1750 MG/350ML IV SOLN
1750.0000 mg | INTRAVENOUS | Status: DC
  Administered 2023-07-05: 1750 mg via INTRAVENOUS
  Filled 2023-07-05: qty 350

## 2023-07-05 MED ORDER — POTASSIUM CHLORIDE 10 MEQ/100ML IV SOLN
10.0000 meq | INTRAVENOUS | Status: DC
  Administered 2023-07-05: 10 meq via INTRAVENOUS
  Filled 2023-07-05 (×2): qty 100

## 2023-07-05 NOTE — Progress Notes (Signed)
Patient additional PIV placement at this time. Bedside RN notified. Will hold additional PIV placement at this time and consult IV team as needed.

## 2023-07-05 NOTE — Progress Notes (Signed)
07/05/2023   I have seen and evaluated the patient for pleural effusion   S:  No improvement in breathing after chest tube placement. Remains severely dyspneic with any activity.  Wife at bedside.  O: Blood pressure 96/66, pulse 64, temperature 97.6 F (36.4 C), temperature source Oral, resp. rate 17, height 5\' 3"  (1.6 m), weight 81.6 kg, SpO2 94 %.    Frail elderly man sitting on side of bed Chest tube with titling, no airleak, serosanguineous fluid in atrium Positive muscle wasting Moves to command Good insight  Chest x-ray reveals lung entrapment Procalcitonin negative, lactic acid negative, normal white count   A:  Based on initial stains, this appears to be a malignant effusion.  There is been no benefit to pleural drainage.  The lung appears to be entrapped.  Would remove chest tube.  I do see there are GPC's on the Gram stain.  My suspicion is this this is going to be a contaminant.  Okay to continue vancomycin until speciation is back.  If it comes back as a skin flora would discontinue antibiotics.   Advanced COPD Unintentional weight loss Severe protein calorie malnutrition POA  P:  Spoke with patient/wife at length regarding findings.  He is interested in talking with oncology but not interested in traditional chemotherapy.  He informs me he has been told a few years ago that he is less than 5 years to live due to the severity of his underlying COPD.  He and wife are still processing current diagnosis.  Await formal cytology.  Discussed with primary team.  PCCM will be available as needed.  Myrla Halsted MD [facility] Prefer epic messenger for cross cover needs If after hours, please call E-link

## 2023-07-05 NOTE — Progress Notes (Signed)
Pharmacy Antibiotic Note  Chase Lloyd is a 67 y.o. male for which pharmacy has been consulted for ampicillin-sulbactam dosing for  aspiration pna .  Now adding vancomycin for GPC in clusters found in the pleural fluid -CrCl ~ 75  Plan: -Vancomycin 1750mg  IV q24hr (estimated AUC= 490 using SCr 0.94) -Will follow renal function, cultures and clinical progress    Height: 5\' 11"  (180.3 cm) Weight: 70.2 kg (154 lb 12.2 oz) IBW/kg (Calculated) : 75.3  Temp (24hrs), Avg:98.8 F (37.1 C), Min:97.6 F (36.4 C), Max:100.1 F (37.8 C)  Recent Labs  Lab 07/04/23 1006 07/04/23 2025 07/04/23 2324 07/05/23 0418  WBC 7.7  --   --  6.5  CREATININE 1.13  --   --  0.94  LATICACIDVEN  --  1.1 0.9  --     Estimated Creatinine Clearance: 74.7 mL/min (by C-G formula based on SCr of 0.94 mg/dL).    No Known Allergies  Microbiology results: 8/31 blood x2- ngtd 8/31 pleural fluid: GPC/clusters  Thank you for allowing pharmacy to be a part of this patient's care.  Harland German, PharmD Clinical Pharmacist **Pharmacist phone directory can now be found on [email or web address] ([email or web address]).  Listed under [facility].

## 2023-07-05 NOTE — Progress Notes (Signed)
PROGRESS NOTE    Chase Lloyd  WUJ:[id number] DOB: Dec 03, 1955 DOA: 07/04/2023 PCP: Shirline Frees, NP    Brief Narrative:   Chase Lloyd is a 67 y.o. male with past medical history significant for Hodgkin's lymphoma, COPD/emphysema, chronic respiratory failure on 2 L nasal cannula at baseline, PAD, prior history of tobacco abuse, GERD who presented to [facility] ED on 8/31 with epigastric discomfort, nausea/vomiting over the last 2-3 days.  Spouse reports that patient has not been feeling well for months and has lost weight.  Also endorses increased shortness of breath, orthopnea, insomnia and reports of diarrhea.  Has been needing to increase his oxygen at home due to his dyspnea.  Reports previously having fluid drained off his [place] years ago.  Denies any blood in his stools.    Recent upper endoscopy for dysphagia and colonoscopy for colon cancer screening on 01/15/2023 with Dr. Leonides Schanz with findings of white nummular lesions in the esophageal mucosa that were biopsied, erythematous mucosa in the antrum which was biopsied, duodenitis, 4 sessile polyps in the transverse colon 3-7 mm which were removed and nonbleeding internal hemorrhoids.  In the ED, temperature 98.0 F, HR 119, RR 22, BP 147/76, SpO2 100% on 2 L nasal cannula.  WBC 7.7, hemoglobin 10.7, platelet count 152.  Sodium 135, potassium 2.9, chloride 94, CO2 32, glucose 120, BUN 9, creatinine 1.13.  AST 19, ALT 21, total bilirubin 1.1.  Lipase 18.  BNP 48.4.  Lactic acid 0.9, procalcitonin less than 0.10.  TSH 8.374.  Urinalysis unrevealing.  Chest x-ray with significantly worsening right greater than left lower lung aeration likely due to progressive multifocal pneumonia, superimposed upon severe emphysema/chronic bronchitis possibly loculated pleural effusion.  CT angiogram abdomen/pelvis with no acute findings in abdomen/pelvis, potential flow-limiting stenosis proximal right external iliac artery, chronic occlusion right SFA,  large right pleural effusion with right pleural thickening/enhancement which are progressive in comparison to 02/07/2023, patchy airspace disease lingula and left lower lobe compatible with multifocal pneumonia, upper normal para-aortic lymph nodes new since 2020 measuring up to 10 mm and LAD hepatoduodenal ligament; nonspecific but may be reactive. Patient had been ordered 20 mEq of potassium chloride IV, 2 g of magnesium sulfate, 1 L of lactated Ringer's, GI cocktail, and Haldol 5 mL IV.  TRH consulted for admission for further evaluation management of multifocal pneumonia, pleural effusion.  Assessment & Plan:   Sepsis, POA Multifocal pneumonia Exudative pleural effusion On admission patient was noted to be tachycardic and tachypneic meeting SIRS criteria, but also noted to have temperature of 100.1 F. CT scan of the abdomen pelvis noted large right-sided pleural effusion with right pleural thickening/enhancement progressed when compared to previous CT from 4/6 and concern for patchy airspace disease in the lingula and left lower lobe concerning for multifocal pneumonia.  Pulmonology was consulted and patient underwent chest tube placement on 07/04/2023.  Strep pneumo antigen negative.  MRSA PCR negative.  Procalcitonin less than 0.10.  Lights criteria positive consistent with exudative effusion -- Pulmonology following, appreciate assistance -- Legionella antigen: Pending -- 1.1L out since placement -- Azithromycin 500 mg IV every 24 hours x 5 days -- Unasyn 3 g IV every 6 hours -- Cytology: Pending -- Continue chest tube to wall suction; and monitoring of output  COPD/emphysema Chronic chronic respiratory failure At baseline patient is on 2 L of nasal cannula oxygen due to his symptoms pleural effusion he had been requiring more oxygen than normal.  No significant wheezing noted on  physical exam.  Worsening respiratory status likely related to pleural effusion versus pneumonia.  Declined by [facility]  for lung transplant. -- Dulera 2 puffs twice daily -- Incruse Ellipta 1 puff daily -- Albuterol nebs PRN for shortness of breath/wheezing -- Continue supplemental oxygen maintain O2 saturation greater >88%   Nausea and vomiting Patient reports nausea and vomiting over the last 2 to 3 days which she has not been able to eat eat or drink much of anything.  CT angiogram of the abdomen and pelvis did not note any concerns for mesenteric ischemia.  Patient had EGD in March that noted duodenitis, but biopsies did not show any signs of H. pylori. -- Protonix 40mg  IV q12h -- Antiemetics as needed -- Aspiration precautions with elevation head of the bed   Hypokalemia Hypomagnesemia Repleted.  Potassium 3.5, magnesium 1.9 this morning, will continue repletion. -- Repeat electrolytes in a.m.   Normocytic anemia No reports of blood loss.  Drop likely dilutional given poor oral intake in the days preceding hospitalization. -- Hgb 10.7>8.7 -- CBC daily  Elevated TSH TSH 8.374. -- Check free T4   Chronic diastolic congestive heart failure, compensated Last echocardiogram noted EF to be 55 to 60% with grade 1 diastolic dysfunction back in 11/2022.  BNP 48.4. -- Strict I&Os and daily weights   PAD/CAD Patient with history of left external iliac artery stenting by Dr. Kirke Corin in 4/3 and subsequent right iliofemoral endarterectomy with patch angioplasty by Dr. Juanetta Gosling 4/10. -- Crestor 10 mg p.o. daily -- Aspirin 81 mg p.o. daily -- Continue outpatient follow-up with vascular surgery   Essential hypertension --Imdur 30 mg p.o. every morning -- Metoprolol succinate 25 mg p.o. daily   GERD --Protonix   DVT prophylaxis: enoxaparin (LOVENOX) injection 40 mg Start: 07/04/23 1815    Code Status: Full Code Family Communication: No family present at bedside this morning  Disposition Plan:  Level of care: Telemetry Medical Status is: Inpatient Remains inpatient appropriate because: Chest tube in  place, awaiting cytology, IV antibiotics    Consultants:  PCCM  Procedures:  Chest tube  Antimicrobials:  Azithromycin 8/31>> Unasyn 8/31>>   Subjective: Patient seen examined bedside, resting comfortably.  Lying in bed.  Reports dyspnea improved.  Remains with chest tube in place, 1.1 L removed since insertion.  Discussed with patient's findings of exudative effusion, concern for malignancy versus infection.  Remains on IV antibiotics.  No other questions or concerns at this time.  Patient reports is not followed by oncology currently, his history of Hodgkin's lymphoma was treated previously in [place].  Denies headache, no dizziness, no chest pain, no palpitations, no fever/chills/night sweats, no current nausea/vomiting, no diarrhea, no focal weakness, no fatigue, no paresthesias.  No acute events overnight per nursing staff.  Objective: Vitals:   07/04/23 2058 07/04/23 2349 07/05/23 0332 07/05/23 0820  BP:  (!) 111/52 (!) 127/51 (!) 140/70  Pulse:  100 96 92  Resp:  18 18 (!) 22  Temp:  97.8 F (36.6 C) 98.4 F (36.9 C) 97.6 F (36.4 C)  TempSrc:  Oral Oral Oral  SpO2: 96%  93% 99%  Weight:   70.2 kg   Height:        Intake/Output Summary (Last 24 hours) at 07/05/2023 1039 Last data filed at 07/05/2023 0959 Gross per 24 hour  Intake 1830 ml  Output 1650 ml  Net 180 ml   Filed Weights   07/04/23 0958 07/04/23 1745 07/05/23 0332  Weight: 74.4 kg 70.2 kg  70.2 kg    Examination:  Physical Exam: GEN: NAD, alert and oriented x 3, wd/wn HEENT: NCAT, PERRL, EOMI, sclera clear, MMM PULM: Diminished breath sounds right base to mid lung with chest tube noted in place, + wheezing, no crackles, normal respiratory effort without accessory muscle use, on 4 L nasal cannula with SpO2 93% at rest CV: RRR w/o M/G/R GI: abd soft, NTND, NABS, no R/G/M MSK: no peripheral edema, muscle strength globally intact 5/5 bilateral upper/lower extremities NEURO: CN II-XII intact, no focal  deficits, sensation to light touch intact PSYCH: normal mood/affect Integumentary: dry/intact, no rashes or wounds    Data Reviewed: I have personally reviewed following labs and imaging studies  CBC: Recent Labs  Lab 07/04/23 1006 07/05/23 0418  WBC 7.7 6.5  HGB 10.7* 8.7*  HCT 33.4* 27.3*  MCV 94.1 90.4  PLT 152 125*   Basic Metabolic Panel: Recent Labs  Lab 07/04/23 1006 07/05/23 0418  NA 135 135  K 2.9* 3.5  CL 94* 99  CO2 32 26  GLUCOSE 120* 107*  BUN 9 7*  CREATININE 1.13 0.94  CALCIUM 9.1 8.4*  MG 1.5* 1.9   GFR: Estimated Creatinine Clearance: 74.7 mL/min (by C-G formula based on SCr of 0.94 mg/dL). Liver Function Tests: Recent Labs  Lab 07/04/23 1006  AST 19  ALT 21  ALKPHOS 73  BILITOT 1.1  PROT 6.8  ALBUMIN 2.7*   Recent Labs  Lab 07/04/23 1006 07/04/23 2025  LIPASE 18  --   AMYLASE  --  36   No results for input(s): "AMMONIA" in the last 168 hours. Coagulation Profile: No results for input(s): "INR", "PROTIME" in the last 168 hours. Cardiac Enzymes: No results for input(s): "CKTOTAL", "CKMB", "CKMBINDEX", "TROPONINI" in the last 168 hours. BNP (last 3 results) No results for input(s): "PROBNP" in the last 8760 hours. HbA1C: No results for input(s): "HGBA1C" in the last 72 hours. CBG: No results for input(s): "GLUCAP" in the last 168 hours. Lipid Profile: No results for input(s): "CHOL", "HDL", "LDLCALC", "TRIG", "CHOLHDL", "LDLDIRECT" in the last 72 hours. Thyroid Function Tests: Recent Labs    07/04/23 1006  TSH 8.374*   Anemia Panel: No results for input(s): "VITAMINB12", "FOLATE", "FERRITIN", "TIBC", "IRON", "RETICCTPCT" in the last 72 hours. Sepsis Labs: Recent Labs  Lab 07/04/23 2025 07/04/23 2324  PROCALCITON <0.10  --   LATICACIDVEN 1.1 0.9    Recent Results (from the past 240 hour(s))  Culture, body fluid w Gram Stain-bottle     Status: None (Preliminary result)   Collection Time: 07/04/23  6:34 PM   Specimen:  Pleura  Result Value Ref Range Status   Specimen Description PLEURAL  Final   Special Requests NONE  Final   Culture   Final    NO GROWTH < 12 HOURS Performed at [facility], [address], [place], [place] [address]    Report Status PENDING  Incomplete  Gram stain     Status: None   Collection Time: 07/04/23  6:34 PM   Specimen: Pleura  Result Value Ref Range Status   Specimen Description PLEURAL  Final   Special Requests NONE  Final   Gram Stain   Final    NO WBC SEEN RED BLOOD CELLS PRESENT NO ORGANISMS SEEN Performed at [facility], [address] [place], [place] [address]    Report Status 07/04/2023 FINAL  Final  Culture, blood (routine x 2) Call MD if unable to obtain prior to antibiotics  being given     Status: None (Preliminary result)   Collection Time: 07/04/23  8:23 PM   Specimen: BLOOD RIGHT ARM  Result Value Ref Range Status   Specimen Description BLOOD RIGHT ARM  Final   Special Requests   Final    BOTTLES DRAWN AEROBIC AND ANAEROBIC Blood Culture adequate volume   Culture   Final    NO GROWTH < 12 HOURS Performed at [facility], [address] [place], [place] [address]    Report Status PENDING  Incomplete  Culture, blood (routine x 2) Call MD if unable to obtain prior to antibiotics being given     Status: None (Preliminary result)   Collection Time: 07/04/23  8:23 PM   Specimen: BLOOD LEFT ARM  Result Value Ref Range Status   Specimen Description BLOOD LEFT ARM  Final   Special Requests   Final    BOTTLES DRAWN AEROBIC AND ANAEROBIC Blood Culture results may not be optimal due to an excessive volume of blood received in culture bottles   Culture   Final    NO GROWTH < 12 HOURS Performed at [facility], [address] [place], [place] [address]    Report Status PENDING  Incomplete  MRSA Next Gen by PCR, Nasal     Status: None   Collection Time: 07/04/23  9:07 PM   Specimen: Nasal Mucosa; Nasal Swab  Result Value Ref  Range Status   MRSA by PCR Next Gen NOT DETECTED NOT DETECTED Final    Comment: (NOTE) The GeneXpert MRSA Assay (FDA approved for NASAL specimens only), is one component of a comprehensive MRSA colonization surveillance program. It is not intended to diagnose MRSA infection nor to guide or monitor treatment for MRSA infections. Test performance is not FDA approved in patients less than 71 years old. Performed at [facility], [address], [place], [place] [address]          Radiology Studies: DG Chest Port 1 View  Result Date: 07/05/2023 CLINICAL DATA:  Status post chest tube placement for pleural effusion. EXAM: PORTABLE CHEST 1 VIEW COMPARISON:  07/04/2023 FINDINGS: Stable cardiomediastinal contours. Aortic atherosclerotic calcifications. Right-sided chest tube is identified, stable in position overlying the right hemidiaphragm. There has been decreased volume of the right pleural effusion. Small ex vacuo pneumothorax is identified within the right lung base which measures approximately 2.1 cm in thickness. Scarring and architectural distortion within the perihilar right upper lobe. Diffuse coarsened interstitial markings of COPD/emphysema. Bilateral peripheral opacities are identified, most severe within the left lung base. Atelectatic changes involving the right lower lobe again noted. IMPRESSION: 1. Stable position of right-sided chest tube. 2. Decreased volume of right pleural effusion. 3. Small ex vacuo pneumothorax within the right lung base. 4. Bilateral peripheral opacities, most severe within the left lung base. 5. COPD/emphysema. 6. These results will be called to the ordering clinician or representative by the Radiologist Assistant, and communication documented in the PACS or [employer]. Electronically Signed   By: Signa Kell M.D.   On: 07/05/2023 07:23   DG Chest Port 1 View  Result Date: 07/04/2023 CLINICAL DATA:  Chest tube, pleural effusion EXAM: PORTABLE  CHEST 1 VIEW COMPARISON:  07/04/2023 FINDINGS: Right chest tube is in place. No pneumothorax. Airspace disease throughout much of the right mid and lower lung with small right pleural effusion. Airspace disease in the left lower lobe. Heart mediastinal contours are within normal limits. IMPRESSION: Right chest tube  in place without pneumothorax. Right mid and lower lung airspace disease with small right effusion. Left lower lobe airspace opacity, unchanged Electronically Signed   By: Charlett Nose M.D.   On: 07/04/2023 20:32   DG Chest 2 View  Result Date: 07/04/2023 CLINICAL DATA:  Epigastric pain pleural effusion.  Nausea. EXAM: CHEST - 2 VIEW COMPARISON:  02/11/2023 FINDINGS: Hyperinflation. Cervical spine fixation. Midline trachea. Mild cardiomegaly. Significantly worsened right-sided aeration, favored to be due to a combination of increased airspace disease and possible inferomedial loculated pleural fluid. Marked interstitial coarsening remains. Lateral left lung base airspace disease is new or progressive. IMPRESSION: Significantly worsened right greater than left lower lung aeration, likely due to progressive multifocal pneumonia, superimposed upon severe emphysema/chronic bronchitis. Within the inferior right hemithorax, portion of this opacification could be secondary to loculated inferior medial pleural fluid. Consider further evaluation with CT. Electronically Signed   By: Jeronimo Greaves M.D.   On: 07/04/2023 17:16   CT Angio Abd/Pel W and/or Wo Contrast  Result Date: 07/04/2023 CLINICAL DATA:  Postprandial pain. EXAM: CTA ABDOMEN AND PELVIS WITHOUT AND WITH CONTRAST TECHNIQUE: Multidetector CT imaging of the abdomen and pelvis was performed using the standard protocol during bolus administration of intravenous contrast. Multiplanar reconstructed images and MIPs were obtained and reviewed to evaluate the vascular anatomy. RADIATION DOSE REDUCTION: This exam was performed according to the  departmental dose-optimization program which includes automated exposure control, adjustment of the mA and/or kV according to patient size and/or use of iterative reconstruction technique. CONTRAST:  OMNIPAQUE IOHEXOL 350 MG/ML SOLN COMPARISON:  Chest CTA 02/07/2023 abdomen and pelvis CT 02/14/2019 FINDINGS: VASCULAR Aorta: Normal caliber aorta without aneurysm, dissection, vasculitis or significant stenosis. Moderate atherosclerosis. Celiac: Patent without evidence of aneurysm, dissection, vasculitis or significant stenosis. SMA: Patent without evidence of aneurysm, dissection, vasculitis or significant stenosis. Renals: Both renal arteries are patent without evidence of aneurysm, dissection, vasculitis, fibromuscular dysplasia or significant stenosis. IMA: Patent without evidence of aneurysm, dissection, vasculitis or significant stenosis. Inflow: Patent without evidence of aneurysm, dissection, vasculitis or significant stenosis. Proximal Outflow: Potential flow limiting stenosis of the proximal right external iliac artery. Apparent chronic occlusion right superficial femoral artery. Veins: No obvious venous abnormality within the limitations of this arterial phase study. Review of the MIP images confirms the above findings. NON-VASCULAR Lower chest: Advanced changes of emphysema noted in the lung bases with patchy consolidative airspace disease in the lingula and left lower lobe. There is collapse noted in the right lower lobe with a large right pleural effusion associated with right pleural thickening/enhancement. Changes in the thorax are progressive comparing to 02/07/2023. Hepatobiliary: No suspicious focal abnormality within the liver parenchyma. Gallbladder is nondistended. No intrahepatic or extrahepatic biliary dilation. Pancreas: No focal mass lesion. No dilatation of the main duct. No intraparenchymal cyst. No peripancreatic edema. Spleen: Small hypoenhancing lesion in the dome of the spleen is  stable since 2020 consistent with benign etiology such as hemangioma. Adrenals/Urinary Tract: No adrenal nodule or mass. Small simple cyst noted upper pole right kidney left kidney unremarkable. No evidence for hydroureter. The urinary bladder appears normal for the degree of distention. Stomach/Bowel: Stomach is unremarkable. No evidence of outlet obstruction. Apparent wall thickening in the proximal stomach likely secondary to underdistention. Duodenum is normally positioned as is the ligament of Treitz. No small bowel wall thickening. No small bowel dilatation. The terminal ileum is normal. The appendix is not well visualized, but there is no edema or inflammation in the region of  the cecal tip to suggest appendicitis. No gross colonic mass. No colonic wall thickening. Lymphatic: Upper normal para-aortic lymph nodes are new since 2020, measuring up to 10 mm short axis. Upper normal lymph nodes are seen in the hepatoduodenal ligament is well. 11 mm short axis retrocaval node is visible on 92/5. No pelvic sidewall lymphadenopathy. Reproductive: The prostate gland and seminal vesicles are unremarkable. Other: No intraperitoneal free fluid. Musculoskeletal: Status post lumbar fusion. No worrisome lytic or sclerotic osseous abnormality. IMPRESSION: 1. No acute findings in the abdomen or pelvis. Specifically, no evidence for mesenteric arterial abnormality to explain the patient's history of postprandial pain. 2. Potential flow limiting stenosis of the proximal right external iliac artery. Apparent chronic occlusion right superficial femoral artery. 3. Large right pleural effusion with right pleural thickening/enhancement. Changes in the right hemithorax are progressive comparing to 02/07/2023. Close follow-up recommended as neoplasm is not excluded. 4. Patchy airspace disease in the lingula and left lower lobe compatible with multifocal pneumonia. 5. Upper normal para-aortic lymph nodes are new since 2020, measuring  up to 10 mm short axis. Upper normal lymph nodes are seen in the hepatoduodenal ligament is well. 11 mm short axis retrocaval node is visible on 92/5. These are nonspecific and may be reactive. Follow-up CT in 3 months recommended to ensure stability. 6. Aortic Atherosclerosis (ICD10-I70.0) and Emphysema (ICD10-J43.9). Electronically Signed   By: Kennith Center M.D.   On: 07/04/2023 15:31        Scheduled Meds:  aspirin EC  81 mg Oral Daily   enoxaparin (LOVENOX) injection  40 mg Subcutaneous Q24H   feeding supplement  237 mL Oral BID BM   isosorbide mononitrate  30 mg Oral q AM   metoprolol succinate  25 mg Oral Daily   mometasone-formoterol  2 puff Inhalation BID   pantoprazole (PROTONIX) IV  40 mg Intravenous Q12H   rosuvastatin  10 mg Oral Daily   sodium chloride flush  10 mL Intrapleural Q8H   sodium chloride flush  3 mL Intravenous Q12H   umeclidinium bromide  1 puff Inhalation Daily   Continuous Infusions:  ampicillin-sulbactam (UNASYN) IV 3 g (07/05/23 0444)   azithromycin 500 mg (07/04/23 2229)     LOS: 1 day    Time spent: 53 minutes spent on chart review, discussion with nursing staff, consultants, updating family and interview/physical exam; more than 50% of that time was spent in counseling and/or coordination of care.    Alvira Philips [place], DO [facility] Available via Epic secure chat 7am-7pm After these hours, please refer to coverage provider listed on [email or web address] 07/05/2023, 10:39 AM

## 2023-07-05 NOTE — Progress Notes (Signed)
Chest tube removed per order. Pt tolerated well. Educated on bedrest .  07/05/23 1536  Vitals  Temp 99.3 F (37.4 C)  Temp Source Oral  BP (!) 148/46  MAP (mmHg) 74  BP Location Left Arm  BP Method Automatic  Patient Position (if appropriate) Lying  Pulse Rate 99  Pulse Rate Source Monitor  ECG Heart Rate 100  Resp (!) 21  Level of Consciousness  Level of Consciousness Alert  MEWS COLOR  MEWS Score Color Green  Oxygen Therapy  SpO2 91 %  O2 Device Nasal Cannula  O2 Flow Rate (L/min) 2.5 L/min  MEWS Score  MEWS Temp 0  MEWS Systolic 0  MEWS Pulse 0  MEWS RR 1  MEWS LOC 0  MEWS Score 1

## 2023-07-06 ENCOUNTER — Inpatient Hospital Stay (HOSPITAL_COMMUNITY): Payer: Medicare Other

## 2023-07-06 DIAGNOSIS — R6510 Systemic inflammatory response syndrome (SIRS) of non-infectious origin without acute organ dysfunction: Secondary | ICD-10-CM

## 2023-07-06 LAB — PHOSPHORUS: Phosphorus: 3.1 mg/dL (ref 2.5–4.6)

## 2023-07-06 LAB — CBC
HCT: 26.3 % — ABNORMAL LOW (ref 39.0–52.0)
Hemoglobin: 8.2 g/dL — ABNORMAL LOW (ref 13.0–17.0)
MCH: 29.4 pg (ref 26.0–34.0)
MCHC: 31.2 g/dL (ref 30.0–36.0)
MCV: 94.3 fL (ref 80.0–100.0)
Platelets: 141 10*3/uL — ABNORMAL LOW (ref 150–400)
RBC: 2.79 MIL/uL — ABNORMAL LOW (ref 4.22–5.81)
RDW: 13.2 % (ref 11.5–15.5)
WBC: 6.8 10*3/uL (ref 4.0–10.5)
nRBC: 0 % (ref 0.0–0.2)

## 2023-07-06 LAB — BASIC METABOLIC PANEL
Anion gap: 7 (ref 5–15)
BUN: 8 mg/dL (ref 8–23)
CO2: 28 mmol/L (ref 22–32)
Calcium: 8.6 mg/dL — ABNORMAL LOW (ref 8.9–10.3)
Chloride: 101 mmol/L (ref 98–111)
Creatinine, Ser: 1.12 mg/dL (ref 0.61–1.24)
GFR, Estimated: >60 mL/min (ref >60)
Glucose, Bld: 100 mg/dL — ABNORMAL HIGH (ref 70–99)
Potassium: 4 mmol/L (ref 3.5–5.1)
Sodium: 136 mmol/L (ref 135–145)

## 2023-07-06 LAB — MAGNESIUM: Magnesium: 1.7 mg/dL (ref 1.7–2.4)

## 2023-07-06 MED ORDER — AMOXICILLIN-POT CLAVULANATE 875-125 MG PO TABS: 1.0000 | ORAL_TABLET | Freq: Two times a day (BID) | ORAL | 0 refills | Status: AC

## 2023-07-06 MED ORDER — LINEZOLID 600 MG PO TABS: 600.0000 mg | ORAL_TABLET | Freq: Two times a day (BID) | ORAL | 0 refills | Status: AC

## 2023-07-06 NOTE — TOC Transition Note (Signed)
Transition of Care (TOC) - CM/SW Discharge Note Donn Pierini RN, BSN Transitions of Care Unit 4E- RN Case Manager See Treatment Team for direct phone #   Patient Details  Name: Chase Lloyd MRN: [id number] Date of Birth: April 30, 1956  [facility] CM/SW Contact:  Darrold Span, RN Phone Number: 07/06/2023, 11:26 AM   Clinical Narrative:    Transition of Care (TOC) - Inpatient Brief Assessment  Pt stable for transition home today, spouse to transport home, pt to follow up as per AVS instructions.   Transition of Care Asessment: Insurance and Status: Insurance coverage has been reviewed Patient has primary care physician: Yes Home environment has been reviewed: home with spouse Prior level of function:: independent Prior/Current Home Services: No current home services Social Determinants of Health Reivew: SDOH reviewed no interventions necessary Readmission risk has been reviewed: Yes Transition of care needs: no transition of care needs at this time         Patient Goals and CMS Choice    Return home,   Discharge Placement                   Home      Discharge Plan and Services Additional resources added to the After Visit Summary for                                     Social Determinants of Health (SDOH) Interventions SDOH Screenings   Food Insecurity: No Food Insecurity (07/04/2023)  Housing: Low Risk  (07/04/2023)  Transportation Needs: No Transportation Needs (07/04/2023)  Utilities: Not At Risk (07/04/2023)  Alcohol Screen: Low Risk  (11/13/2022)  Depression (PHQ2-9): Low Risk  (01/15/2023)  Financial Resource Strain: Low Risk  (11/13/2022)  Physical Activity: Insufficiently Active (11/13/2022)  Social Connections: Moderately Isolated (11/13/2022)  Stress: No Stress Concern Present (11/13/2022)  Tobacco Use: Medium Risk (07/04/2023)     Readmission Risk Interventions    07/06/2023   11:25 AM  Readmission Risk Prevention  Plan  Transportation Screening Complete  PCP or Specialist Appt within 5-7 Days --  Home Care Screening Complete  Medication Review (RN CM) Complete

## 2023-07-06 NOTE — Discharge Summary (Signed)
Physician Discharge Summary  Chase Lloyd ZOX:[id number] DOB: [birth year] DOA: 07/04/2023  PCP: Shirline Frees, NP  Admit date: 07/04/2023 Discharge date: 07/06/2023  Admitted From: Home Disposition: Home  Recommendations for Outpatient Follow-up:  Follow up with PCP in 1-2 weeks Ambulatory referral placed to medical oncology outpatient Follow-up cytology from pleural fluid, concerning for malignancy Overall given his comorbidities seems likely poor prognosis; anticipate likely need to consider hospice in the near future  Home Health: No Equipment/Devices: Home oxygen  Discharge Condition: Stable CODE STATUS: Full code Diet recommendation: Regular diet  History of present illness:  Chase Lloyd is a 67 y.o. male with past medical history significant for Hodgkin's lymphoma, COPD/emphysema, chronic respiratory failure on 2 L nasal cannula at baseline, PAD, prior history of tobacco abuse, GERD who presented to [facility] ED on 8/31 with epigastric discomfort, nausea/vomiting over the last 2-3 days.  Spouse reports that patient has not been feeling well for months and has lost weight.  Also endorses increased shortness of breath, orthopnea, insomnia and reports of diarrhea.  Has been needing to increase his oxygen at home due to his dyspnea.  Reports previously having fluid drained off his Lupi years ago.  Denies any blood in his stools.     Recent upper endoscopy for dysphagia and colonoscopy for colon cancer screening on 01/15/2023 with Dr. Leonides Schanz with findings of white nummular lesions in the esophageal mucosa that were biopsied, erythematous mucosa in the antrum which was biopsied, duodenitis, 4 sessile polyps in the transverse colon 3-7 mm which were removed and nonbleeding internal hemorrhoids.   In the ED, temperature 98.0 F, HR 119, RR 22, BP 147/76, SpO2 100% on 2 L nasal cannula.  WBC 7.7, hemoglobin 10.7, platelet count 152.  Sodium 135, potassium 2.9, chloride 94, CO2 32,  glucose 120, BUN 9, creatinine 1.13.  AST 19, ALT 21, total bilirubin 1.1.  Lipase 18.  BNP 48.4.  Lactic acid 0.9, procalcitonin less than 0.10.  TSH 8.374.  Urinalysis unrevealing.  Chest x-ray with significantly worsening right greater than left lower lung aeration likely due to progressive multifocal pneumonia, superimposed upon severe emphysema/chronic bronchitis possibly loculated pleural effusion.  CT angiogram abdomen/pelvis with no acute findings in abdomen/pelvis, potential flow-limiting stenosis proximal right external iliac artery, chronic occlusion right SFA, large right pleural effusion with right pleural thickening/enhancement which are progressive in comparison to 02/07/2023, patchy airspace disease lingula and left lower lobe compatible with multifocal pneumonia, upper normal para-aortic lymph nodes new since 2020 measuring up to 10 mm and LAD hepatoduodenal ligament; nonspecific but may be reactive. Patient had been ordered 20 mEq of potassium chloride IV, 2 g of magnesium sulfate, 1 L of lactated Ringer's, GI cocktail, and Haldol 5 mL IV.  TRH consulted for admission for further evaluation management of multifocal pneumonia, pleural effusion.  Hospital course:  Sepsis, POA Multifocal pneumonia Exudative pleural effusion concerning for malignancy On admission patient was noted to be tachycardic and tachypneic meeting SIRS criteria, but also noted to have temperature of 100.1 F. CT scan of the abdomen pelvis noted large right-sided pleural effusion with right pleural thickening/enhancement progressed when compared to previous CT from 4/6 and concern for patchy airspace disease in the lingula and left lower lobe concerning for multifocal pneumonia.  Pulmonology was consulted and patient underwent chest tube placement on 07/04/2023; with greater than 1 L fluid removed.  Strep pneumo antigen negative.  MRSA PCR negative.  Procalcitonin less than 0.10.  Lights criteria positive consistent with  exudative effusion.  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  Also has 40-year pack history of smoking.  With reduced output from chest tube, chest tube was removed on 07/05/2023 with no significant change in terms of his likely trapped lung per pulmonology.  Will continue antibiotics on discharge with Zyvox and Augmentin to complete antibiotic course.  Cytology pending at time of discharge.  Referral placed to medical oncology.  Overall poor prognosis; may need to consider hospice care in the near future.   COPD/emphysema Chronic chronic respiratory failure At baseline patient is on 2 L of nasal cannula oxygen due to his symptoms pleural effusion he had been requiring more oxygen than normal.  No significant wheezing noted on physical exam.  Worsening respiratory status likely related to pleural effusion versus pneumonia.  Declined by [facility] for lung transplant.  Continue home Advair and Spiriva.  Continues on his baseline oxygen requirement on discharge.   Nausea and vomiting: Resolved Patient reports nausea and vomiting over the last 2 to 3 days which she has not been able to eat eat or drink much of anything.  CT angiogram of the abdomen and pelvis did not note any concerns for mesenteric ischemia.  Patient had EGD in March that noted duodenitis, but biopsies did not show any signs of H. pylori.  Continue PPI.   Hypokalemia Hypomagnesemia Repleted during the hospitalization.   Normocytic anemia No reports of blood loss.  Drop likely dilutional given poor oral intake in the days preceding hospitalization.  Hemoglobin 8.7 at time of discharge.  Elevated TSH TSH elevated at 8.374. free T4 1.05, within normal limits.   Chronic diastolic congestive heart failure, compensated Last echocardiogram noted EF to be 55 to 60% with grade 1  diastolic dysfunction back in 11/2022.  BNP 48.4. -- Strict I&Os and daily weights   PAD/CAD Patient with history of left external iliac artery stenting by Dr. Kirke Corin in 4/3 and subsequent right iliofemoral endarterectomy with patch angioplasty by Dr. Juanetta Gosling 4/10.  Continue Crestor, aspirin. Continue outpatient follow-up with vascular surgery   Essential hypertension Imdur 30 mg p.o. every morning, Metoprolol succinate 25 mg p.o. daily   GERD Continue PPI.  Severe protein calorie malnutrition, POA Unintentional weight loss Body mass index is 21.59 kg/m.  Suspected due to underlying malignancy  Discharge Diagnoses:  Principal Problem:   SIRS (systemic inflammatory response syndrome) (HCC) Active Problems:   Multifocal pneumonia   Pleural effusion   COPD with chronic bronchitis and emphysema (HCC)   Chronic respiratory failure with hypoxia (HCC)   Nausea and vomiting   Hypokalemia   Hypomagnesemia   Normocytic anemia   Chronic heart failure with preserved ejection fraction (HFpEF) (HCC)   CAD (coronary artery disease)   PAD (peripheral artery disease) (HCC)   Essential hypertension   GERD (gastroesophageal reflux disease)    Discharge Instructions  Discharge Instructions     Ambulatory referral to Hematology / Oncology   Complete by: As directed    Call MD for:  difficulty breathing, headache or visual disturbances   Complete by: As directed    Call MD for:  extreme fatigue   Complete by: As directed    Call MD for:  persistant dizziness or light-headedness   Complete by: As directed    Call MD for:  persistant nausea and vomiting   Complete by: As  directed    Call MD for:  severe uncontrolled pain   Complete by: As directed    Call MD for:  temperature >100.4   Complete by: As directed    Diet - low sodium heart healthy   Complete by: As directed    Increase activity slowly   Complete by: As directed    No wound care   Complete by: As directed        Allergies as of 07/06/2023   No Known Allergies      Medication List     TAKE these medications    albuterol 108 (90 Base) MCG/ACT inhaler Commonly known as: VENTOLIN HFA Inhale 2 puffs into the lungs every 6 (six) hours as needed for wheezing or shortness of breath.   albuterol (2.5 MG/3ML) 0.083% nebulizer solution Commonly known as: PROVENTIL Take 3 mLs (2.5 mg total) by nebulization every 4 (four) hours as needed for wheezing or shortness of breath.   amoxicillin-clavulanate 875-125 MG tablet Commonly known as: AUGMENTIN Take 1 tablet by mouth 2 (two) times daily for 10 days.   Aspirin Low Dose 81 MG tablet Generic drug: aspirin EC Take 1 tablet (81 mg total) by mouth daily. Swallow whole.   fluticasone-salmeterol 230-21 MCG/ACT inhaler Commonly known as: Advair HFA Inhale 2 puffs into the lungs 2 (two) times daily.   furosemide 20 MG tablet Commonly known as: LASIX Take 1 tablet (20 mg total) by mouth as needed. Take 1 tablet (20mg ) by mouth as needed for swelling   ibuprofen 800 MG tablet Commonly known as: ADVIL Take 800 mg by mouth every 8 (eight) hours as needed for moderate pain.   isosorbide mononitrate 30 MG 24 hr tablet Commonly known as: IMDUR Take 1 tablet (30 mg total) by mouth in the morning.   linezolid 600 MG tablet Commonly known as: Zyvox Take 1 tablet (600 mg total) by mouth 2 (two) times daily for 10 days.   metoprolol succinate 25 MG 24 hr tablet Commonly known as: TOPROL-XL Take 1 tablet (25 mg total) by mouth daily.   omeprazole 40 MG capsule Commonly known as: PRILOSEC Take 40 mg by mouth daily.   OXYGEN Inhale 2-4 L into the lungs continuous.   rosuvastatin 10 MG tablet Commonly known as: CRESTOR Take 10 mg by mouth daily.   tiotropium 18 MCG inhalation capsule Commonly known as: Spiriva HandiHaler Place 1 capsule (18 mcg total) into inhaler and inhale daily.        Follow-up Information     Shirline Frees, NP.  Schedule an appointment as soon as possible for a visit in 1 week(s).   Specialty: Family Medicine Contact information: [address] [place] [address] [phone]         [facility] Follow up.   Specialty: Pulmonology Contact information: [address] [place] [address] [phone]        [facility] at [facility]. Schedule an appointment as soon as possible for a visit.   Specialty: Oncology Contact information: [address] [place] [address] [phone]               No Known Allergies  Consultations: PCCM   Procedures/Studies: DG CHEST PORT 1 VIEW  Result Date: 07/06/2023 CLINICAL DATA:  142230 Pleural effusion 142230 EXAM: PORTABLE CHEST - 1 VIEW COMPARISON:  the previous day's study FINDINGS: Interval removal of right basilar chest tube. No  significant pneumothorax. Chronic right lateral pleural thickening or loculated effusion. Coarse opacities in the lung bases right greater than left as before. Heart size and mediastinal contours are within normal limits. Aortic Atherosclerosis (ICD10-170.0). Visualized bones unremarkable. IMPRESSION: 1. Interval right chest tube removal without pneumothorax. 2. Persistent bibasilar opacities and right lateral pleural thickening or loculated effusion. Electronically Signed   By: Corlis Leak M.D.   On: 07/06/2023 09:24   DG Chest Port 1 View  Result Date: 07/05/2023 CLINICAL DATA:  Status post chest tube placement for pleural effusion. EXAM: PORTABLE CHEST 1 VIEW COMPARISON:  07/04/2023 FINDINGS: Stable cardiomediastinal contours. Aortic atherosclerotic calcifications. Right-sided chest tube is identified, stable in position overlying the right hemidiaphragm. There has been decreased volume of the right pleural effusion. Small ex vacuo pneumothorax is identified within the right lung base which  measures approximately 2.1 cm in thickness. Scarring and architectural distortion within the perihilar right upper lobe. Diffuse coarsened interstitial markings of COPD/emphysema. Bilateral peripheral opacities are identified, most severe within the left lung base. Atelectatic changes involving the right lower lobe again noted. IMPRESSION: 1. Stable position of right-sided chest tube. 2. Decreased volume of right pleural effusion. 3. Small ex vacuo pneumothorax within the right lung base. 4. Bilateral peripheral opacities, most severe within the left lung base. 5. COPD/emphysema. 6. These results will be called to the ordering clinician or representative by the Radiologist Assistant, and communication documented in the PACS or [employer]. Electronically Signed   By: Signa Kell M.D.   On: 07/05/2023 07:23   DG Chest Port 1 View  Result Date: 07/04/2023 CLINICAL DATA:  Chest tube, pleural effusion EXAM: PORTABLE CHEST 1 VIEW COMPARISON:  07/04/2023 FINDINGS: Right chest tube is in place. No pneumothorax. Airspace disease throughout much of the right mid and lower lung with small right pleural effusion. Airspace disease in the left lower lobe. Heart mediastinal contours are within normal limits. IMPRESSION: Right chest tube in place without pneumothorax. Right mid and lower lung airspace disease with small right effusion. Left lower lobe airspace opacity, unchanged Electronically Signed   By: Charlett Nose M.D.   On: 07/04/2023 20:32   DG Chest 2 View  Result Date: 07/04/2023 CLINICAL DATA:  Epigastric pain pleural effusion.  Nausea. EXAM: CHEST - 2 VIEW COMPARISON:  02/11/2023 FINDINGS: Hyperinflation. Cervical spine fixation. Midline trachea. Mild cardiomegaly. Significantly worsened right-sided aeration, favored to be due to a combination of increased airspace disease and possible inferomedial loculated pleural fluid. Marked interstitial coarsening remains. Lateral left lung base airspace disease is  new or progressive. IMPRESSION: Significantly worsened right greater than left lower lung aeration, likely due to progressive multifocal pneumonia, superimposed upon severe emphysema/chronic bronchitis. Within the inferior right hemithorax, portion of this opacification could be secondary to loculated inferior medial pleural fluid. Consider further evaluation with CT. Electronically Signed   By: Jeronimo Greaves M.D.   On: 07/04/2023 17:16   CT Angio Abd/Pel W and/or Wo Contrast  Result Date: 07/04/2023 CLINICAL DATA:  Postprandial pain. EXAM: CTA ABDOMEN AND PELVIS WITHOUT AND WITH CONTRAST TECHNIQUE: Multidetector CT imaging of the abdomen and pelvis was performed using the standard protocol during bolus administration of intravenous contrast. Multiplanar reconstructed images and MIPs were obtained and reviewed to evaluate the vascular anatomy. RADIATION DOSE REDUCTION: This exam was performed according to the departmental dose-optimization program which includes automated exposure control, adjustment of the mA and/or kV according to patient size and/or use of iterative reconstruction technique. CONTRAST:  OMNIPAQUE IOHEXOL 350  MG/ML SOLN COMPARISON:  Chest CTA 02/07/2023 abdomen and pelvis CT 02/14/2019 FINDINGS: VASCULAR Aorta: Normal caliber aorta without aneurysm, dissection, vasculitis or significant stenosis. Moderate atherosclerosis. Celiac: Patent without evidence of aneurysm, dissection, vasculitis or significant stenosis. SMA: Patent without evidence of aneurysm, dissection, vasculitis or significant stenosis. Renals: Both renal arteries are patent without evidence of aneurysm, dissection, vasculitis, fibromuscular dysplasia or significant stenosis. IMA: Patent without evidence of aneurysm, dissection, vasculitis or significant stenosis. Inflow: Patent without evidence of aneurysm, dissection, vasculitis or significant stenosis. Proximal Outflow: Potential flow limiting stenosis of the proximal  right external iliac artery. Apparent chronic occlusion right superficial femoral artery. Veins: No obvious venous abnormality within the limitations of this arterial phase study. Review of the MIP images confirms the above findings. NON-VASCULAR Lower chest: Advanced changes of emphysema noted in the lung bases with patchy consolidative airspace disease in the lingula and left lower lobe. There is collapse noted in the right lower lobe with a large right pleural effusion associated with right pleural thickening/enhancement. Changes in the thorax are progressive comparing to 02/07/2023. Hepatobiliary: No suspicious focal abnormality within the liver parenchyma. Gallbladder is nondistended. No intrahepatic or extrahepatic biliary dilation. Pancreas: No focal mass lesion. No dilatation of the main duct. No intraparenchymal cyst. No peripancreatic edema. Spleen: Small hypoenhancing lesion in the dome of the spleen is stable since 2020 consistent with benign etiology such as hemangioma. Adrenals/Urinary Tract: No adrenal nodule or mass. Small simple cyst noted upper pole right kidney left kidney unremarkable. No evidence for hydroureter. The urinary bladder appears normal for the degree of distention. Stomach/Bowel: Stomach is unremarkable. No evidence of outlet obstruction. Apparent wall thickening in the proximal stomach likely secondary to underdistention. Duodenum is normally positioned as is the ligament of Treitz. No small bowel wall thickening. No small bowel dilatation. The terminal ileum is normal. The appendix is not well visualized, but there is no edema or inflammation in the region of the cecal tip to suggest appendicitis. No gross colonic mass. No colonic wall thickening. Lymphatic: Upper normal para-aortic lymph nodes are new since 2020, measuring up to 10 mm short axis. Upper normal lymph nodes are seen in the hepatoduodenal ligament is well. 11 mm short axis retrocaval node is visible on 92/5. No pelvic  sidewall lymphadenopathy. Reproductive: The prostate gland and seminal vesicles are unremarkable. Other: No intraperitoneal free fluid. Musculoskeletal: Status post lumbar fusion. No worrisome lytic or sclerotic osseous abnormality. IMPRESSION: 1. No acute findings in the abdomen or pelvis. Specifically, no evidence for mesenteric arterial abnormality to explain the patient's history of postprandial pain. 2. Potential flow limiting stenosis of the proximal right external iliac artery. Apparent chronic occlusion right superficial femoral artery. 3. Large right pleural effusion with right pleural thickening/enhancement. Changes in the right hemithorax are progressive comparing to 02/07/2023. Close follow-up recommended as neoplasm is not excluded. 4. Patchy airspace disease in the lingula and left lower lobe compatible with multifocal pneumonia. 5. Upper normal para-aortic lymph nodes are new since 2020, measuring up to 10 mm short axis. Upper normal lymph nodes are seen in the hepatoduodenal ligament is well. 11 mm short axis retrocaval node is visible on 92/5. These are nonspecific and may be reactive. Follow-up CT in 3 months recommended to ensure stability. 6. Aortic Atherosclerosis (ICD10-I70.0) and Emphysema (ICD10-J43.9). Electronically Signed   By: Kennith Center M.D.   On: 07/04/2023 15:31     Subjective: Patient seen examined bedside, resting comfortably.  No specific complaints this morning.  Reports breathing is  at his baseline.  Wants to discharge home today.  Discussed with patient that his pleural fluid findings are concerning for malignancy but cytology still pending.  Discussed referral to oncology.  No other specific questions or concerns at this time.  Denies headache, no visual changes, no chest pain, no palpitations, no shortness of breath more than his typical baseline, no abdominal pain, no fever/chills/night sweats, no nausea/vomiting/diarrhea, no focal weakness, no fatigue, no paresthesias.   No acute events overnight per nursing staff.  Discharge Exam: Vitals:   07/06/23 0813 07/06/23 0900  BP: (!) 121/55   Pulse: 100 (!) 102  Resp: 19 (!) 26  Temp: 98.6 F (37 C)   SpO2: 96% 97%   Vitals:   07/05/23 2309 07/06/23 0314 07/06/23 0813 07/06/23 0900  BP: (!) 109/47 (!) 110/35 (!) 121/55   Pulse: (!) 108 93 100 (!) 102  Resp: 20 20 19  (!) 26  Temp: 98.8 F (37.1 C) 98.6 F (37 C) 98.6 F (37 C)   TempSrc: Oral Oral Oral   SpO2: 96% 98% 96% 97%  Weight:      Height:        Physical Exam: GEN: NAD, alert and oriented x 3, thin in appearance HEENT: NCAT, PERRL, EOMI, sclera clear, MMM PULM: Diminished breath sounds left midlung to base, faint expiratory wheezing bilaterally, normal respiratory effort without accessory muscle use, on 2 L nasal cannula with SpO2 93% at rest.  CV: RRR w/o M/G/R GI: abd soft, NTND, NABS, no R/G/M MSK: no peripheral edema, muscle strength globally intact 5/5 bilateral upper/lower extremities NEURO: CN II-XII intact, no focal deficits, sensation to light touch intact PSYCH: normal mood/affect Integumentary: dry/intact, no rashes or wounds    The results of significant diagnostics from this hospitalization (including imaging, microbiology, ancillary and laboratory) are listed below for reference.     Microbiology: Recent Results (from the past 240 hour(s))  Culture, body fluid w Gram Stain-bottle     Status: None (Preliminary result)   Collection Time: 07/04/23  6:34 PM   Specimen: Pleura  Result Value Ref Range Status   Specimen Description PLEURAL  Final   Special Requests NONE  Final   Gram Stain   Final    GRAM POSITIVE COCCI IN CLUSTERS IN BOTH AEROBIC AND ANAEROBIC BOTTLES CRITICAL RESULT CALLED TO, READ BACK BY AND VERIFIED WITH: RN Jeananne Rama [id number] AT 1523 BY EC Performed at [facility], [address], [place], [place] [address]    Culture GRAM POSITIVE COCCI  Final   Report Status PENDING  Incomplete   Gram stain     Status: None   Collection Time: 07/04/23  6:34 PM   Specimen: Pleura  Result Value Ref Range Status   Specimen Description PLEURAL  Final   Special Requests NONE  Final   Gram Stain   Final    NO WBC SEEN RED BLOOD CELLS PRESENT NO ORGANISMS SEEN Performed at [facility], [address], [place], [place] [address]    Report Status 07/04/2023 FINAL  Final  Culture, blood (routine x 2) Call MD if unable to obtain prior to antibiotics being given     Status: None (Preliminary result)   Collection Time: 07/04/23  8:23 PM   Specimen: BLOOD RIGHT ARM  Result Value Ref Range Status   Specimen Description BLOOD RIGHT ARM  Final   Special Requests   Final    BOTTLES DRAWN AEROBIC AND ANAEROBIC Blood Culture adequate volume  Culture   Final    NO GROWTH 2 DAYS Performed at [facility], [address] [place], [place] [address]    Report Status PENDING  Incomplete  Culture, blood (routine x 2) Call MD if unable to obtain prior to antibiotics being given     Status: None (Preliminary result)   Collection Time: 07/04/23  8:23 PM   Specimen: BLOOD LEFT ARM  Result Value Ref Range Status   Specimen Description BLOOD LEFT ARM  Final   Special Requests   Final    BOTTLES DRAWN AEROBIC AND ANAEROBIC Blood Culture results may not be optimal due to an excessive volume of blood received in culture bottles   Culture   Final    NO GROWTH 2 DAYS Performed at [facility], [address] [place], [place] [address]    Report Status PENDING  Incomplete  MRSA Next Gen by PCR, Nasal     Status: None   Collection Time: 07/04/23  9:07 PM   Specimen: Nasal Mucosa; Nasal Swab  Result Value Ref Range Status   MRSA by PCR Next Gen NOT DETECTED NOT DETECTED Final    Comment: (NOTE) The GeneXpert MRSA Assay (FDA approved for NASAL specimens only), is one component of a comprehensive MRSA colonization surveillance program. It is not intended to diagnose MRSA infection  nor to guide or monitor treatment for MRSA infections. Test performance is not FDA approved in patients less than 64 years old. Performed at [facility], [address] [place], [place] [address]      Labs: BNP (last 3 results) Recent Labs    07/04/23 1006  BNP 48.4   Basic Metabolic Panel: Recent Labs  Lab 07/04/23 1006 07/05/23 0418 07/06/23 0407  NA 135 135 136  K 2.9* 3.5 4.0  CL 94* 99 101  CO2 32 26 28  GLUCOSE 120* 107* 100*  BUN 9 7* 8  CREATININE 1.13 0.94 1.12  CALCIUM 9.1 8.4* 8.6*  MG 1.5* 1.9 1.7  PHOS  --   --  3.1   Liver Function Tests: Recent Labs  Lab 07/04/23 1006  AST 19  ALT 21  ALKPHOS 73  BILITOT 1.1  PROT 6.8  ALBUMIN 2.7*   Recent Labs  Lab 07/04/23 1006 07/04/23 2025  LIPASE 18  --   AMYLASE  --  36   No results for input(s): "AMMONIA" in the last 168 hours. CBC: Recent Labs  Lab 07/04/23 1006 07/05/23 0418 07/06/23 0407  WBC 7.7 6.5 6.8  HGB 10.7* 8.7* 8.2*  HCT 33.4* 27.3* 26.3*  MCV 94.1 90.4 94.3  PLT 152 125* 141*   Cardiac Enzymes: No results for input(s): "CKTOTAL", "CKMB", "CKMBINDEX", "TROPONINI" in the last 168 hours. BNP: Invalid input(s): "POCBNP" CBG: No results for input(s): "GLUCAP" in the last 168 hours. D-Dimer No results for input(s): "DDIMER" in the last 72 hours. Hgb A1c No results for input(s): "HGBA1C" in the last 72 hours. Lipid Profile No results for input(s): "CHOL", "HDL", "LDLCALC", "TRIG", "CHOLHDL", "LDLDIRECT" in the last 72 hours. Thyroid function studies Recent Labs    07/04/23 1006  TSH 8.374*   Anemia work up Recent Labs    07/05/23 0418  VITAMINB12 277  FOLATE 5.6*  FERRITIN 439*  TIBC 157*  IRON 18*  RETICCTPCT 1.0   Urinalysis    Component Value Date/Time   COLORURINE AMBER (A) 07/04/2023 1001   APPEARANCEUR HAZY (A) 07/04/2023 1001   LABSPEC 1.023 07/04/2023 1001  PHURINE 5.0 07/04/2023 1001   GLUCOSEU NEGATIVE 07/04/2023 1001   HGBUR NEGATIVE  07/04/2023 1001   BILIRUBINUR NEGATIVE 07/04/2023 1001   KETONESUR NEGATIVE 07/04/2023 1001   PROTEINUR 100 (A) 07/04/2023 1001   NITRITE NEGATIVE 07/04/2023 1001   LEUKOCYTESUR NEGATIVE 07/04/2023 1001   Sepsis Labs Recent Labs  Lab 07/04/23 1006 07/05/23 0418 07/06/23 0407  WBC 7.7 6.5 6.8   Microbiology Recent Results (from the past 240 hour(s))  Culture, body fluid w Gram Stain-bottle     Status: None (Preliminary result)   Collection Time: 07/04/23  6:34 PM   Specimen: Pleura  Result Value Ref Range Status   Specimen Description PLEURAL  Final   Special Requests NONE  Final   Gram Stain   Final    GRAM POSITIVE COCCI IN CLUSTERS IN BOTH AEROBIC AND ANAEROBIC BOTTLES CRITICAL RESULT CALLED TO, READ BACK BY AND VERIFIED WITH: RN Jeananne Rama [id number] AT 1523 BY EC Performed at [facility], [address], [place], [place] [address]    Culture GRAM POSITIVE COCCI  Final   Report Status PENDING  Incomplete  Gram stain     Status: None   Collection Time: 07/04/23  6:34 PM   Specimen: Pleura  Result Value Ref Range Status   Specimen Description PLEURAL  Final   Special Requests NONE  Final   Gram Stain   Final    NO WBC SEEN RED BLOOD CELLS PRESENT NO ORGANISMS SEEN Performed at [facility], [address] [place], [place] [address]    Report Status 07/04/2023 FINAL  Final  Culture, blood (routine x 2) Call MD if unable to obtain prior to antibiotics being given     Status: None (Preliminary result)   Collection Time: 07/04/23  8:23 PM   Specimen: BLOOD RIGHT ARM  Result Value Ref Range Status   Specimen Description BLOOD RIGHT ARM  Final   Special Requests   Final    BOTTLES DRAWN AEROBIC AND ANAEROBIC Blood Culture adequate volume   Culture   Final    NO GROWTH 2 DAYS Performed at [facility], [address] [place], [place] [address]    Report Status PENDING  Incomplete  Culture, blood (routine x 2) Call MD if unable to obtain prior to  antibiotics being given     Status: None (Preliminary result)   Collection Time: 07/04/23  8:23 PM   Specimen: BLOOD LEFT ARM  Result Value Ref Range Status   Specimen Description BLOOD LEFT ARM  Final   Special Requests   Final    BOTTLES DRAWN AEROBIC AND ANAEROBIC Blood Culture results may not be optimal due to an excessive volume of blood received in culture bottles   Culture   Final    NO GROWTH 2 DAYS Performed at [facility], [address] [place], [place] [address]    Report Status PENDING  Incomplete  MRSA Next Gen by PCR, Nasal     Status: None   Collection Time: 07/04/23  9:07 PM   Specimen: Nasal Mucosa; Nasal Swab  Result Value Ref Range Status   MRSA by PCR Next Gen NOT DETECTED NOT DETECTED Final    Comment: (NOTE) The GeneXpert MRSA Assay (FDA approved for NASAL specimens only), is one component of a comprehensive MRSA colonization surveillance program. It is not intended to diagnose MRSA infection nor to guide or monitor treatment for MRSA infections. Test performance is not FDA approved in patients  less than 80 years old. Performed at [facility], [address], [place], [place] [address]      Time coordinating discharge: Over 30 minutes  SIGNED:   Alvira Philips [place], DO  [facility] 07/06/2023, 10:47 AM

## 2023-07-07 ENCOUNTER — Telehealth: Payer: Self-pay

## 2023-07-07 ENCOUNTER — Telehealth: Payer: Self-pay | Admitting: Adult Health

## 2023-07-07 ENCOUNTER — Ambulatory Visit: Payer: Medicare Other | Admitting: Cardiovascular Disease

## 2023-07-07 LAB — LEGIONELLA PNEUMOPHILA SEROGP 1 UR AG: L. pneumophila Serogp 1 Ur Ag: NEGATIVE

## 2023-07-07 NOTE — Transitions of Care (Post Inpatient/ED Visit) (Signed)
07/07/2023  Name: Chase Lloyd MRN: [id number] DOB: 1956-09-01  Today's TOC FU Call Status: Today's TOC FU Call Status:: Successful TOC FU Call Completed TOC FU Call Complete Date: 07/07/23 Patient's Name and Date of Birth confirmed.  Transition Care Management Follow-up Telephone Call Date of Discharge: 07/06/23 Discharge Facility: [facility]) Type of Discharge: Inpatient Admission Primary Inpatient Discharge Diagnosis:: "pleural effusion" How have you been since you were released from the hospital?: Same (Spoke with pt and wife-pt states he is using oxyge at 3L/min via [place] cont-sats 90-93%, decreased appetite very weak/fatigued) Any questions or concerns?: Yes Patient Questions/Concerns:: Wife states she called cancer center to get appt twice today-no return call-family very anxious & upset that pt does not have oncology appt and has not been seen or told further info about possible cancer diagnosis Patient Questions/Concerns Addressed: Other: (RN contacted [facility]-spoke with Mariah-advised that lung navigator had received referrral today and is work que-office was closed yest due to [employer] is being reviewed and scheduler will reach out to pt/family in next few days. wife made aware)  Items Reviewed: Did you receive and understand the discharge instructions provided?: Yes Medications obtained,verified, and reconciled?: Partial Review Completed Reason for Partial Mediation Review: pt did not feel up to med review Any new allergies since your discharge?: No Dietary orders reviewed?: NA Do you have support at home?: Yes People in Home: spouse Name of Support/Comfort Primary Source: Chase Lloyd  Medications Reviewed Today: Medications Reviewed Today   Medications were not reviewed in this encounter     Home Care and Equipment/Supplies: Any new equipment or medical supplies ordered?: NA  Functional Questionnaire: Do you need assistance with bathing/showering or  dressing?: Yes Do you need assistance with meal preparation?: Yes Do you need assistance with eating?: No Do you have difficulty maintaining continence: No Do you need assistance with getting out of bed/getting out of a chair/moving?: Yes Do you have difficulty managing or taking your medications?: Yes  Follow up appointments reviewed: PCP Follow-up appointment confirmed?: No (Wife declined-did not want to make any appts until pt seen by oncologist-strongly encouraged her to at least consider virtual/telehealth visit with provider given pt's conditions and still awaiting oncology appt to be arranged) MD Provider Line Number:[phone] Given: No Specialist Hospital Follow-up appointment confirmed?: No Reason Specialist Follow-Up Not Confirmed: Patient has Specialist Provider Number and will Call for Appointment (wife was provided with new patient referral line number at Cancer center [phone] that was given to  RN CM when contacted office today and instructed to give to pt/wife, wife does not want to make lung appt until seen by oncologist) Do you need transportation to your follow-up appointment?: No Do you understand care options if your condition(s) worsen?: Yes-patient verbalized understanding  SDOH Interventions Today    Flowsheet Row Most Recent Value  SDOH Interventions   Food Insecurity Interventions Intervention Not Indicated  Transportation Interventions Intervention Not Indicated      TOC Interventions Today    Flowsheet Row Most Recent Value  TOC Interventions   TOC Interventions Discussed/Reviewed TOC Interventions Discussed, Contacted provider for patient needs  [contacted [facility] center-spoke with Dow Adolph regarding referral-confirmed referral sent on Sun-office was closed on Monday due to holiday and navigator has referral in work que to review]      Interventions Today    Flowsheet Row Most Recent Value  Chronic Disease   Chronic disease during today's visit Other   [possible CA dx]  General Interventions  General Interventions Discussed/Reviewed General Interventions Discussed, Doctor Visits  Doctor Visits Discussed/Reviewed Doctor Visits Discussed, PCP, Specialist  PCP/Specialist Visits Compliance with follow-up visit  Education Interventions   Education Provided Provided Education  Provided Verbal Education On When to see the doctor, Other  [sx mgmt]  Nutrition Interventions   Nutrition Discussed/Reviewed Nutrition Discussed  Pharmacy Interventions   Pharmacy Dicussed/Reviewed Pharmacy Topics Discussed  Safety Interventions   Safety Discussed/Reviewed Safety Discussed       Alessandra Grout [facility] Care Management Care Management Community Coordinator Direct Phone: [phone] Toll Free: [phone] Fax: [phone]

## 2023-07-08 LAB — CULTURE, BODY FLUID W GRAM STAIN -BOTTLE

## 2023-07-09 ENCOUNTER — Telehealth: Payer: Self-pay | Admitting: Pulmonary Disease

## 2023-07-09 ENCOUNTER — Other Ambulatory Visit: Payer: Self-pay

## 2023-07-09 DIAGNOSIS — J90 Pleural effusion, not elsewhere classified: Secondary | ICD-10-CM

## 2023-07-09 LAB — CULTURE, BLOOD (ROUTINE X 2)
Culture: NO GROWTH
Culture: NO GROWTH
Special Requests: ADEQUATE

## 2023-07-09 LAB — CYTOLOGY - NON PAP

## 2023-07-09 NOTE — Progress Notes (Signed)
I reached out to pt's wife who had called the oncology clinic several times. I introduced myself and explained my position as a NN. Pt's wife was very upset that the pt hadn't been seen by oncology yet. She states "we were told he has cancer and may need to be on hospice" I validated Chase Lloyd's frustrations and explained that the fluid they drained off his lung was sent for testing, so until we know if it's cancer and what type it is, we can't make a treatment plan. The cytology of the fluid is still pending. I gave Chase Lloyd my direct number and that she can call me with any questions.

## 2023-07-09 NOTE — Telephone Encounter (Signed)
Author: Lytle Butte, RN Author Type: Registered Nurse Filed: 07/09/2023  4:22 PM   I reached out to pt's wife who had called the oncology clinic several times. I introduced myself and explained my position as a NN. Pt's wife was very upset that the pt hadn't been seen by oncology yet. She states "we were told he has cancer and may need to be on hospice" I validated Mrs. Domzalski's frustrations and explained that the fluid they drained off his lung was sent for testing, so until we know if it's cancer and what type it is, we can't make a treatment plan. The cytology of the fluid is still pending. I gave Mrs.Braaten my direct number and that she can call me with any questions.       Per Dr. Madelyn Flavors, Admitting Hospitalist: They already had an appointment scheduled for tomorrow morning with Dr. Shirline Frees but I will let the wife know of the results   Dr. Everardo All - FYI ONLY!

## 2023-07-09 NOTE — Telephone Encounter (Signed)
Patient was in the hospital over the weekend and they told him that he has cancer. They have been unable to get in touch with another oncologist and were advised to giver [name] a call. They mentioned that he would have to go to hospice. They have not been told the location of the cancer.Please call and advise for test results [phone].

## 2023-07-10 ENCOUNTER — Inpatient Hospital Stay (HOSPITAL_BASED_OUTPATIENT_CLINIC_OR_DEPARTMENT_OTHER): Payer: Medicare Other | Admitting: Internal Medicine

## 2023-07-10 ENCOUNTER — Inpatient Hospital Stay: Payer: Medicare Other

## 2023-07-10 ENCOUNTER — Other Ambulatory Visit: Payer: Self-pay | Admitting: Internal Medicine

## 2023-07-10 ENCOUNTER — Encounter: Payer: Self-pay | Admitting: Internal Medicine

## 2023-07-10 ENCOUNTER — Inpatient Hospital Stay: Payer: Medicare Other | Attending: Internal Medicine

## 2023-07-10 VITALS — BP 114/35 | HR 78 | Temp 98.2°F | Resp 20 | Ht 69.0 in | Wt 159.0 lb

## 2023-07-10 DIAGNOSIS — C3492 Malignant neoplasm of unspecified part of left bronchus or lung: Secondary | ICD-10-CM | POA: Diagnosis not present

## 2023-07-10 DIAGNOSIS — J910 Malignant pleural effusion: Secondary | ICD-10-CM | POA: Insufficient documentation

## 2023-07-10 DIAGNOSIS — C3491 Malignant neoplasm of unspecified part of right bronchus or lung: Secondary | ICD-10-CM

## 2023-07-10 DIAGNOSIS — R630 Anorexia: Secondary | ICD-10-CM | POA: Insufficient documentation

## 2023-07-10 DIAGNOSIS — Z8043 Family history of malignant neoplasm of testis: Secondary | ICD-10-CM

## 2023-07-10 DIAGNOSIS — J90 Pleural effusion, not elsewhere classified: Secondary | ICD-10-CM

## 2023-07-10 DIAGNOSIS — Z87891 Personal history of nicotine dependence: Secondary | ICD-10-CM | POA: Insufficient documentation

## 2023-07-10 DIAGNOSIS — J449 Chronic obstructive pulmonary disease, unspecified: Secondary | ICD-10-CM

## 2023-07-10 DIAGNOSIS — D649 Anemia, unspecified: Secondary | ICD-10-CM | POA: Insufficient documentation

## 2023-07-10 DIAGNOSIS — C3490 Malignant neoplasm of unspecified part of unspecified bronchus or lung: Secondary | ICD-10-CM

## 2023-07-10 DIAGNOSIS — Z808 Family history of malignant neoplasm of other organs or systems: Secondary | ICD-10-CM | POA: Diagnosis not present

## 2023-07-10 LAB — CBC WITH DIFFERENTIAL (CANCER CENTER ONLY)
Abs Immature Granulocytes: 0.07 10*3/uL (ref 0.00–0.07)
Basophils Absolute: 0 10*3/uL (ref 0.0–0.1)
Basophils Relative: 1 %
Eosinophils Absolute: 0.1 10*3/uL (ref 0.0–0.5)
Eosinophils Relative: 1 %
HCT: 26.6 % — ABNORMAL LOW (ref 39.0–52.0)
Hemoglobin: 8.3 g/dL — ABNORMAL LOW (ref 13.0–17.0)
Immature Granulocytes: 1 %
Lymphocytes Relative: 8 %
Lymphs Abs: 0.7 10*3/uL (ref 0.7–4.0)
MCH: 29.9 pg (ref 26.0–34.0)
MCHC: 31.2 g/dL (ref 30.0–36.0)
MCV: 95.7 fL (ref 80.0–100.0)
Monocytes Absolute: 0.4 10*3/uL (ref 0.1–1.0)
Monocytes Relative: 4 %
Neutro Abs: 7.3 10*3/uL (ref 1.7–7.7)
Neutrophils Relative %: 85 %
Platelet Count: 205 10*3/uL (ref 150–400)
RBC: 2.78 MIL/uL — ABNORMAL LOW (ref 4.22–5.81)
RDW: 14 % (ref 11.5–15.5)
WBC Count: 8.6 10*3/uL (ref 4.0–10.5)
nRBC: 0 % (ref 0.0–0.2)

## 2023-07-10 LAB — CMP (CANCER CENTER ONLY)
ALT: 23 U/L (ref 0–44)
AST: 20 U/L (ref 15–41)
Albumin: 2.9 g/dL — ABNORMAL LOW (ref 3.5–5.0)
Alkaline Phosphatase: 68 U/L (ref 38–126)
Anion gap: 7 (ref 5–15)
BUN: 12 mg/dL (ref 8–23)
CO2: 29 mmol/L (ref 22–32)
Calcium: 8.6 mg/dL — ABNORMAL LOW (ref 8.9–10.3)
Chloride: 101 mmol/L (ref 98–111)
Creatinine: 1.57 mg/dL — ABNORMAL HIGH (ref 0.61–1.24)
GFR, Estimated: 48 mL/min — ABNORMAL LOW (ref >60)
Glucose, Bld: 92 mg/dL (ref 70–99)
Potassium: 3.5 mmol/L (ref 3.5–5.1)
Sodium: 137 mmol/L (ref 135–145)
Total Bilirubin: 0.6 mg/dL (ref 0.3–1.2)
Total Protein: 6.2 g/dL — ABNORMAL LOW (ref 6.5–8.1)

## 2023-07-10 MED ORDER — INTEGRA PLUS PO CAPS: 1.0000 | ORAL_CAPSULE | Freq: Every day | ORAL | 3 refills | Status: DC

## 2023-07-10 MED ORDER — MIRTAZAPINE 15 MG PO TABS: 30.0000 mg | ORAL_TABLET | Freq: Every day | ORAL | 2 refills | Status: DC

## 2023-07-10 MED ORDER — ONDANSETRON HCL 8 MG PO TABS
8.0000 mg | ORAL_TABLET | Freq: Once | ORAL | Status: AC
  Administered 2023-07-10: 8 mg via ORAL
  Filled 2023-07-10: qty 1

## 2023-07-10 MED ORDER — LIDOCAINE-PRILOCAINE 2.5-2.5 % EX CREA: TOPICAL_CREAM | CUTANEOUS | 0 refills | Status: DC

## 2023-07-10 MED ORDER — PROCHLORPERAZINE MALEATE 10 MG PO TABS: 10.0000 mg | ORAL_TABLET | Freq: Four times a day (QID) | ORAL | 0 refills | Status: DC | PRN

## 2023-07-10 NOTE — Progress Notes (Signed)
[facility] [phone]) [phone]   [phone]  CONSULT NOTE  REFERRING PHYSICIAN: Dr. Mechele Collin  REASON FOR CONSULTATION:  67 years old white male recently diagnosed with lung cancer  HPI Chase Lloyd is a 67 y.o. male with past medical history significant for osteoarthritis, chronic back pain, COPD, coronary artery disease, Hodgkin lymphoma treated at [place] in 1999 with chemotherapy and radiation.  He also has a history of degenerative disc disease, depression, GERD, hearing loss, Mnire's disease, peripheral arterial disease, pneumonia and [place] history of smoking.  The patient was admitted to [facility] on 8 31st 2024 complaining of epigastric discomfort, nausea and vomiting that has been going on for 2-3 days he also has been complaining of worsening dyspnea as well as insomnia and diarrhea.  He has been using his oxygen more at home because of his shortness of breath.  During his evaluation he had CT angiogram of the abdomen and pelvis performed on 8 31st 2024 and that showed no acute findings in the abdomen or pelvis and no evidence of mesenteric arterial abnormality to explain his history of postprandial pain.  There was large right pleural effusion with right pleural thickening/enhancement and the changes in the right hemothorax were progressive compared to 02/07/2023 CT scan of the chest.  There was also patchy airspace disease in the lingula and left lower lobe.  The scan also showed upper normal periaortic lymph nodes that are new since 2020 and measuring up to 1.0 cm in short axis and upper normal lymph node seen in the hepatoduodenal ligament measuring 1.1 cm.  The patient was seen by Dr. Levon Hedger and he underwent right-sided thoracentesis on 8 31st 2024.  The cytology from the pleural fluid ([id number]) showed malignant cells consistent with non-small cell carcinoma. Immunohistochemical stains show that the cells are positive for  p63 and CK5/6 while they are negative for TTF-1, CK7, CK20, CDX2, calretinin, D2-40 and MOC-31.  This immunoprofile is most consistent with squamous cell carcinoma.  When seen today the patient continues to complain of lack of appetite as well as weight loss.  He lost around 10 pounds in the last 3 weeks.  He has intermittent chest pain especially on the right side with shortness of breath and mild cough and no hemoptysis.  He has occasional nausea with no vomiting, abdominal pain, diarrhea or constipation.  He has no headache or visual changes. Family history significant for father died at age 85 and he had diabetes.  Mother still alive at age 100 with no major issues except osteoporosis.  Brother had testicular cancer. The patient is married and has 5 children.  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  He was accompanied today by his wife Clerance Lav and his daughter Gearldine Bienenstock.  He has a history of smoking more than 1 pack/day for around 45 years and quit in 2019.  He has no history of alcohol or drug abuse.  HPI  Past Medical History:  Diagnosis Date   Arthritis    Back pain    COPD (chronic obstructive pulmonary disease) (HCC)    Coronary artery disease    COVID    mild case   DDD (degenerative disc disease), lumbar    Depression    Dyspnea    uses O2 as needed   Emphysema of lung (HCC)    Empyema (HCC)    2016  (was in [place])   GERD (  gastroesophageal reflux disease)    Hearing loss of right ear    started 2001.     microphone in right ear ,  and hearing aid in left   Hodgkin's disease (HCC) 1999   Hypotension    Meniere's disease    Nausea and vomiting 07/04/2023   PAD (peripheral artery disease) (HCC)    Pneumonia    Tinnitus     Past Surgical History:  Procedure Laterality Date   ABDOMINAL AORTOGRAM W/LOWER EXTREMITY Bilateral 02/04/2023   Procedure: ABDOMINAL AORTOGRAM W/LOWER EXTREMITY;  Surgeon: Iran Ouch, MD;  [facility];  Service: Cardiovascular;  Laterality: Bilateral;   BACK SURGERY     BIOPSY  01/15/2023   Procedure: BIOPSY;  Surgeon: Imogene Burn, MD;  Location: [facility];  Service: Gastroenterology;;   CERVICAL FUSION  2016   COLONOSCOPY WITH PROPOFOL N/A 01/15/2023   Procedure: COLONOSCOPY WITH PROPOFOL;  Surgeon: Imogene Burn, MD;  Location: [facility];  Service: Gastroenterology;  Laterality: N/A;   ELBOW SURGERY     ENDARTERECTOMY FEMORAL Right 02/11/2023   Procedure: RIGHT ILIO-FEMORAL ENDARTERECTOMY WITH PATCH ANGIOPLASTY;  Surgeon: [name], MD;  Location: [facility] OR;  Service: Vascular;  Laterality: Right;   ESOPHAGOGASTRODUODENOSCOPY (EGD) WITH PROPOFOL N/A 01/15/2023   Procedure: ESOPHAGOGASTRODUODENOSCOPY (EGD) WITH PROPOFOL;  Surgeon: Imogene Burn, MD;  Location: [facility];  Service: Gastroenterology;  Laterality: N/A;   EYE SURGERY     IR THORACENTESIS ASP PLEURAL SPACE W/IMG GUIDE  10/08/2018   LAMINECTOMY  1999   lower back surgery     PERIPHERAL VASCULAR INTERVENTION Left 02/04/2023   Procedure: PERIPHERAL VASCULAR INTERVENTION;  Surgeon: Iran Ouch, MD;  Location: [facility];  Service: Cardiovascular;  Laterality: Left;  left external iliac   POLYPECTOMY  01/15/2023   Procedure: POLYPECTOMY;  Surgeon: Imogene Burn, MD;  Location: [facility];  Service: Gastroenterology;;   RIGHT/LEFT HEART CATH AND CORONARY ANGIOGRAPHY N/A 04/29/2019   Procedure: RIGHT/LEFT HEART CATH AND CORONARY ANGIOGRAPHY;  Surgeon: Lennette Bihari, MD;  Location: [facility];  Service: Cardiovascular;  Laterality: N/A;   TONSILLECTOMY      Family History  Problem Relation Age of Onset   Hypertension Father    Diabetes Father    Testicular cancer Brother    Melanoma Brother     Social History Social History   Tobacco Use   Smoking status: Former    Current packs/day: 0.00    Average packs/day: 1.5 packs/day for 47.0 years (70.5 ttl pk-yrs)    Types: Cigarettes     Start date: 06/14/1971    Quit date: 06/13/2018    Years since quitting: 5.0   Smokeless tobacco: Never  Vaping Use   Vaping status: Never Used  Substance Use Topics   Alcohol use: Yes    Comment: Occasional   Drug use: No    No Known Allergies  Current Outpatient Medications  Medication Sig Dispense Refill   albuterol (PROVENTIL) (2.5 MG/3ML) 0.083% nebulizer solution Take 3 mLs (2.5 mg total) by nebulization every 4 (four) hours as needed for wheezing or shortness of breath. 75 mL 12   albuterol (VENTOLIN HFA) 108 (90 Base) MCG/ACT inhaler Inhale 2 puffs into the lungs every 6 (six) hours as needed for wheezing or shortness of breath. 8 g 6   amoxicillin-clavulanate (AUGMENTIN) 875-125 MG tablet Take 1 tablet by mouth 2 (two) times daily for 10 days. 20 tablet 0   aspirin EC  81 MG tablet Take 1 tablet (81 mg total) by mouth daily. Swallow whole. 150 tablet 2   fluticasone-salmeterol (ADVAIR HFA) 230-21 MCG/ACT inhaler Inhale 2 puffs into the lungs 2 (two) times daily. 12 g 5   furosemide (LASIX) 20 MG tablet Take 1 tablet (20 mg total) by mouth as needed. Take 1 tablet (20mg ) by mouth as needed for swelling 25 tablet 3   ibuprofen (ADVIL) 800 MG tablet Take 800 mg by mouth every 8 (eight) hours as needed for moderate pain.     isosorbide mononitrate (IMDUR) 30 MG 24 hr tablet Take 1 tablet (30 mg total) by mouth in the morning. 90 tablet 3   linezolid (ZYVOX) 600 MG tablet Take 1 tablet (600 mg total) by mouth 2 (two) times daily for 10 days. 20 tablet 0   metoprolol succinate (TOPROL-XL) 25 MG 24 hr tablet Take 1 tablet (25 mg total) by mouth daily. 90 tablet 3   omeprazole (PRILOSEC) 40 MG capsule Take 40 mg by mouth daily.     OXYGEN Inhale 2-4 L into the lungs continuous.     rosuvastatin (CRESTOR) 10 MG tablet Take 10 mg by mouth daily.     tiotropium (SPIRIVA HANDIHALER) 18 MCG inhalation capsule Place 1 capsule (18 mcg total) into inhaler and inhale daily. 90 capsule 2   No  current facility-administered medications for this visit.    Review of Systems  Constitutional: positive for anorexia, fatigue, and weight loss Eyes: negative Ears, nose, mouth, throat, and face: negative Respiratory: positive for cough, dyspnea on exertion, and pleurisy/chest pain Cardiovascular: negative Gastrointestinal: positive for nausea Genitourinary:negative Integument/breast: negative Hematologic/lymphatic: negative Musculoskeletal:negative Neurological: negative Behavioral/Psych: positive for anxiety Endocrine: negative Allergic/Immunologic: negative  Physical Exam  OZH:YQMVH, healthy, no distress, well nourished, well developed, and anxious SKIN: skin color, texture, turgor are normal, no rashes or significant lesions HEAD: Normocephalic, No masses, lesions, tenderness or abnormalities EYES: normal, PERRLA, Conjunctiva are pink and non-injected EARS: External ears normal, Canals clear OROPHARYNX:no exudate, no erythema, and lips, buccal mucosa, and tongue normal  NECK: supple, no adenopathy, no JVD LYMPH:  no palpable lymphadenopathy, no hepatosplenomegaly LUNGS: coarse sounds heard, decreased breath sounds HEART: regular rate & rhythm, no murmurs, and no gallops ABDOMEN:abdomen soft, non-tender, normal bowel sounds, and no masses or organomegaly BACK: Back symmetric, no curvature., No CVA tenderness EXTREMITIES:no joint deformities, effusion, or inflammation, no edema  NEURO: alert & oriented x 3 with fluent speech, no focal motor/sensory deficits  PERFORMANCE STATUS: ECOG 1  LABORATORY DATA: Lab Results  Component Value Date   WBC 6.8 07/06/2023   HGB 8.2 (L) 07/06/2023   HCT 26.3 (L) 07/06/2023   MCV 94.3 07/06/2023   PLT 141 (L) 07/06/2023      Chemistry      Component Value Date/Time   NA 136 07/06/2023 0407   NA 139 04/25/2019 1632   K 4.0 07/06/2023 0407   CL 101 07/06/2023 0407   CO2 28 07/06/2023 0407   BUN 8 07/06/2023 0407   BUN 11  04/25/2019 1632   CREATININE 1.12 07/06/2023 0407      Component Value Date/Time   CALCIUM 8.6 (L) 07/06/2023 0407   ALKPHOS 73 07/04/2023 1006   AST 19 07/04/2023 1006   ALT 21 07/04/2023 1006   BILITOT 1.1 07/04/2023 1006   BILITOT 0.9 07/01/2019 0741       RADIOGRAPHIC STUDIES: DG CHEST PORT 1 VIEW  Result Date: 07/06/2023 CLINICAL DATA:  142230 Pleural effusion 142230 EXAM: PORTABLE  CHEST - 1 VIEW COMPARISON:  the previous day's study FINDINGS: Interval removal of right basilar chest tube. No significant pneumothorax. Chronic right lateral pleural thickening or loculated effusion. Coarse opacities in the lung bases right greater than left as before. Heart size and mediastinal contours are within normal limits. Aortic Atherosclerosis (ICD10-170.0). Visualized bones unremarkable. IMPRESSION: 1. Interval right chest tube removal without pneumothorax. 2. Persistent bibasilar opacities and right lateral pleural thickening or loculated effusion. Electronically Signed   By: Corlis Leak M.D.   On: 07/06/2023 09:24   DG Chest Port 1 View  Result Date: 07/05/2023 CLINICAL DATA:  Status post chest tube placement for pleural effusion. EXAM: PORTABLE CHEST 1 VIEW COMPARISON:  07/04/2023 FINDINGS: Stable cardiomediastinal contours. Aortic atherosclerotic calcifications. Right-sided chest tube is identified, stable in position overlying the right hemidiaphragm. There has been decreased volume of the right pleural effusion. Small ex vacuo pneumothorax is identified within the right lung base which measures approximately 2.1 cm in thickness. Scarring and architectural distortion within the perihilar right upper lobe. Diffuse coarsened interstitial markings of COPD/emphysema. Bilateral peripheral opacities are identified, most severe within the left lung base. Atelectatic changes involving the right lower lobe again noted. IMPRESSION: 1. Stable position of right-sided chest tube. 2. Decreased volume of right  pleural effusion. 3. Small ex vacuo pneumothorax within the right lung base. 4. Bilateral peripheral opacities, most severe within the left lung base. 5. COPD/emphysema. 6. These results will be called to the ordering clinician or representative by the Radiologist Assistant, and communication documented in the PACS or [employer]. Electronically Signed   By: Signa Kell M.D.   On: 07/05/2023 07:23   DG Chest Port 1 View  Result Date: 07/04/2023 CLINICAL DATA:  Chest tube, pleural effusion EXAM: PORTABLE CHEST 1 VIEW COMPARISON:  07/04/2023 FINDINGS: Right chest tube is in place. No pneumothorax. Airspace disease throughout much of the right mid and lower lung with small right pleural effusion. Airspace disease in the left lower lobe. Heart mediastinal contours are within normal limits. IMPRESSION: Right chest tube in place without pneumothorax. Right mid and lower lung airspace disease with small right effusion. Left lower lobe airspace opacity, unchanged Electronically Signed   By: Charlett Nose M.D.   On: 07/04/2023 20:32   DG Chest 2 View  Result Date: 07/04/2023 CLINICAL DATA:  Epigastric pain pleural effusion.  Nausea. EXAM: CHEST - 2 VIEW COMPARISON:  02/11/2023 FINDINGS: Hyperinflation. Cervical spine fixation. Midline trachea. Mild cardiomegaly. Significantly worsened right-sided aeration, favored to be due to a combination of increased airspace disease and possible inferomedial loculated pleural fluid. Marked interstitial coarsening remains. Lateral left lung base airspace disease is new or progressive. IMPRESSION: Significantly worsened right greater than left lower lung aeration, likely due to progressive multifocal pneumonia, superimposed upon severe emphysema/chronic bronchitis. Within the inferior right hemithorax, portion of this opacification could be secondary to loculated inferior medial pleural fluid. Consider further evaluation with CT. Electronically Signed   By: Jeronimo Greaves M.D.    On: 07/04/2023 17:16   CT Angio Abd/Pel W and/or Wo Contrast  Result Date: 07/04/2023 CLINICAL DATA:  Postprandial pain. EXAM: CTA ABDOMEN AND PELVIS WITHOUT AND WITH CONTRAST TECHNIQUE: Multidetector CT imaging of the abdomen and pelvis was performed using the standard protocol during bolus administration of intravenous contrast. Multiplanar reconstructed images and MIPs were obtained and reviewed to evaluate the vascular anatomy. RADIATION DOSE REDUCTION: This exam was performed according to the departmental dose-optimization program which includes automated exposure control, adjustment of the  mA and/or kV according to patient size and/or use of iterative reconstruction technique. CONTRAST:  OMNIPAQUE IOHEXOL 350 MG/ML SOLN COMPARISON:  Chest CTA 02/07/2023 abdomen and pelvis CT 02/14/2019 FINDINGS: VASCULAR Aorta: Normal caliber aorta without aneurysm, dissection, vasculitis or significant stenosis. Moderate atherosclerosis. Celiac: Patent without evidence of aneurysm, dissection, vasculitis or significant stenosis. SMA: Patent without evidence of aneurysm, dissection, vasculitis or significant stenosis. Renals: Both renal arteries are patent without evidence of aneurysm, dissection, vasculitis, fibromuscular dysplasia or significant stenosis. IMA: Patent without evidence of aneurysm, dissection, vasculitis or significant stenosis. Inflow: Patent without evidence of aneurysm, dissection, vasculitis or significant stenosis. Proximal Outflow: Potential flow limiting stenosis of the proximal right external iliac artery. Apparent chronic occlusion right superficial femoral artery. Veins: No obvious venous abnormality within the limitations of this arterial phase study. Review of the MIP images confirms the above findings. NON-VASCULAR Lower chest: Advanced changes of emphysema noted in the lung bases with patchy consolidative airspace disease in the lingula and left lower lobe. There is collapse noted in  the right lower lobe with a large right pleural effusion associated with right pleural thickening/enhancement. Changes in the thorax are progressive comparing to 02/07/2023. Hepatobiliary: No suspicious focal abnormality within the liver parenchyma. Gallbladder is nondistended. No intrahepatic or extrahepatic biliary dilation. Pancreas: No focal mass lesion. No dilatation of the main duct. No intraparenchymal cyst. No peripancreatic edema. Spleen: Small hypoenhancing lesion in the dome of the spleen is stable since 2020 consistent with benign etiology such as hemangioma. Adrenals/Urinary Tract: No adrenal nodule or mass. Small simple cyst noted upper pole right kidney left kidney unremarkable. No evidence for hydroureter. The urinary bladder appears normal for the degree of distention. Stomach/Bowel: Stomach is unremarkable. No evidence of outlet obstruction. Apparent wall thickening in the proximal stomach likely secondary to underdistention. Duodenum is normally positioned as is the ligament of Treitz. No small bowel wall thickening. No small bowel dilatation. The terminal ileum is normal. The appendix is not well visualized, but there is no edema or inflammation in the region of the cecal tip to suggest appendicitis. No gross colonic mass. No colonic wall thickening. Lymphatic: Upper normal para-aortic lymph nodes are new since 2020, measuring up to 10 mm short axis. Upper normal lymph nodes are seen in the hepatoduodenal ligament is well. 11 mm short axis retrocaval node is visible on 92/5. No pelvic sidewall lymphadenopathy. Reproductive: The prostate gland and seminal vesicles are unremarkable. Other: No intraperitoneal free fluid. Musculoskeletal: Status post lumbar fusion. No worrisome lytic or sclerotic osseous abnormality. IMPRESSION: 1. No acute findings in the abdomen or pelvis. Specifically, no evidence for mesenteric arterial abnormality to explain the patient's history of postprandial pain. 2.  Potential flow limiting stenosis of the proximal right external iliac artery. Apparent chronic occlusion right superficial femoral artery. 3. Large right pleural effusion with right pleural thickening/enhancement. Changes in the right hemithorax are progressive comparing to 02/07/2023. Close follow-up recommended as neoplasm is not excluded. 4. Patchy airspace disease in the lingula and left lower lobe compatible with multifocal pneumonia. 5. Upper normal para-aortic lymph nodes are new since 2020, measuring up to 10 mm short axis. Upper normal lymph nodes are seen in the hepatoduodenal ligament is well. 11 mm short axis retrocaval node is visible on 92/5. These are nonspecific and may be reactive. Follow-up CT in 3 months recommended to ensure stability. 6. Aortic Atherosclerosis (ICD10-I70.0) and Emphysema (ICD10-J43.9). Electronically Signed   By: Kennith Center M.D.   On: 07/04/2023 15:31  ASSESSMENT: This is a very pleasant 67 years old white male recently diagnosed with a stage IV (t1b, N0, M1 A) non-small cell lung cancer, squamous cell carcinoma presented with malignant right pleural effusion in addition to infiltrative lesions in the left lung and there could be underlying lung mass in the collapsed right lung.  This was diagnosed in August 2024.   PLAN: I had a lengthy discussion with the patient and his family today about his current disease stage, prognosis and treatment options.  I personally and independently reviewed the imaging studies and discussed the result with the patient and his family. I explained to the patient that he has incurable condition and all the treatment will be of palliative nature. He was given the option of palliative care and hospice referral versus consideration of palliative systemic chemoimmunotherapy with carboplatin for AUC of 5, paclitaxel 175 Mg/M2 and Keytruda 200 Mg IV every 3 weeks for 4 cycles followed by maintenance treatment with Keytruda every 3 weeks if  the patient has no evidence for disease progression after cycle #4. I will also send his tissue to be tested for molecular studies and PD-L1 expression.  If he has high PD-L1 expression over 50%, I may consider him for treatment with single agent immunotherapy with Keytruda rather than the combination of chemoimmunotherapy. The patient and his family are interested in the treatment.  I discussed with him the adverse effect of this treatment including but not limited to alopecia, myelosuppression, nausea and vomiting, peripheral neuropathy, liver or renal dysfunction as well as immunotherapy adverse effects. I will order a PET scan and MRI of the brain to complete the staging workup. I will arrange for the patient to have a chemotherapy education class before the first dose of his treatment. I will send a prescription of Compazine, Emla cream to his pharmacy. For the lack of appetite and depression, I will start the patient on Remeron 15 mg p.o. nightly and I may consider increasing the dose in the future as tolerated. He will come back for follow-up visit in less than 2 weeks with the start of the first cycle of his treatment. For the history of COPD, he will continue his current treatment with home oxygen. I will refer the patient to interventional radiology for consideration of Port-A-Cath placement. For the anemia, I will start him empirically on Integra +1 capsule p.o. daily. The patient was advised to call immediately if he has any other concerning symptoms in the interval. The patient voices understanding of current disease status and treatment options and is in agreement with the current care plan.  All questions were answered. The patient knows to call the clinic with any problems, questions or concerns. We can certainly see the patient much sooner if necessary.  Thank you so much for allowing me to participate in the care of Valiant L Mabile. I will continue to follow up the patient with you  and assist in his care.  The total time spent in the appointment was 90 minutes.  Disclaimer: This note was dictated with voice recognition software. Similar sounding words can inadvertently be transcribed and may not be corrected upon review.   Lajuana Matte July 10, 2023, 8:30 AM

## 2023-07-10 NOTE — Telephone Encounter (Signed)
Confirmed with Oncology RN navigator patient scheduled with Oncology on 07/10/23.

## 2023-07-10 NOTE — Progress Notes (Signed)
START ON PATHWAY REGIMEN - Non-Small Cell Lung     A cycle is every 21 days:     Pembrolizumab      Paclitaxel      Carboplatin   **Always confirm dose/schedule in your pharmacy ordering system**  Patient Characteristics: Stage IV Metastatic, Squamous, Molecular Analysis Not Elected, PS = 0, 1, Initial Chemotherapy/Immunotherapy, Immunotherapy Candidate, PD-L1 Expression Positive 1-49% (TPS) / Negative / Not Tested / Awaiting Test Results Therapeutic Status: Stage IV Metastatic Histology: Squamous Cell ECOG Performance Status: 1 Chemotherapy/Immunotherapy Line of Therapy: Initial Chemotherapy/Immunotherapy Immunotherapy Candidate Status: Candidate for Immunotherapy PD-L1 Expression Status: Awaiting Test Results Intent of Therapy: Non-Curative / Palliative Intent, Discussed with Patient

## 2023-07-10 NOTE — Progress Notes (Signed)
Request for pt's pleural fluid be sent for molecular studies emailed to Lita Mains, [facility] pathology technician.

## 2023-07-10 NOTE — Progress Notes (Signed)
I met with the Chase Lloyd face to face today at his consult with Dr.Mohamed. He was accompanied by his daughter, Gearldine Bienenstock, and his wife, Macon Large. Chase Lloyd recently diagnosed with Stage IV squamous cell carcinoma. The plan for the Chase Lloyd at this time is to continue his cancer work up. Chase Lloyd will require brain MRI and PET scan. He is scheduled to start a regimen of chemoimmunotherapy the week of 9/16. Pleural fluid being sent to [facility] for molecular studies. Chase Lloyd provided with Lung Cancer journey book. All questions answered Prior to departure, Chase Lloyd started experiencing nausea without emesis. Chase Lloyd provided with emesis bag and I notified Dr.Mohamed. Dr. Arbutus Ped states he will provide the Chase Lloyd with Zofran prior to departing.

## 2023-07-11 ENCOUNTER — Other Ambulatory Visit: Payer: Self-pay

## 2023-07-13 ENCOUNTER — Ambulatory Visit (HOSPITAL_COMMUNITY)
Admission: RE | Admit: 2023-07-13 | Discharge: 2023-07-13 | Disposition: A | Discharge status: Home / Self Care | Payer: Medicare Other | Source: Ambulatory Visit | Attending: Internal Medicine | Admitting: Internal Medicine

## 2023-07-13 DIAGNOSIS — I6782 Cerebral ischemia: Secondary | ICD-10-CM | POA: Diagnosis not present

## 2023-07-13 DIAGNOSIS — C3490 Malignant neoplasm of unspecified part of unspecified bronchus or lung: Secondary | ICD-10-CM | POA: Insufficient documentation

## 2023-07-13 MED ORDER — GADOBUTROL 1 MMOL/ML IV SOLN
7.0000 mL | Freq: Once | INTRAVENOUS | Status: AC | PRN
  Administered 2023-07-13: 7 mL via INTRAVENOUS

## 2023-07-14 ENCOUNTER — Other Ambulatory Visit: Payer: Self-pay | Admitting: Internal Medicine

## 2023-07-14 ENCOUNTER — Other Ambulatory Visit: Payer: Self-pay

## 2023-07-14 DIAGNOSIS — S0990XA Unspecified injury of head, initial encounter: Secondary | ICD-10-CM | POA: Diagnosis not present

## 2023-07-14 DIAGNOSIS — R55 Syncope and collapse: Secondary | ICD-10-CM | POA: Diagnosis not present

## 2023-07-14 DIAGNOSIS — I10 Essential (primary) hypertension: Secondary | ICD-10-CM | POA: Diagnosis not present

## 2023-07-14 DIAGNOSIS — R58 Hemorrhage, not elsewhere classified: Secondary | ICD-10-CM | POA: Diagnosis not present

## 2023-07-14 DIAGNOSIS — I959 Hypotension, unspecified: Secondary | ICD-10-CM | POA: Diagnosis not present

## 2023-07-14 MED ORDER — ALPRAZOLAM 0.25 MG PO TABS: 0.2500 mg | ORAL_TABLET | Freq: Every day | ORAL | 0 refills | Status: DC

## 2023-07-14 NOTE — Progress Notes (Signed)
Pts wife, Macon Large, called me this morning to give me an update on the patient. Last night, around 2:30, the pt started experiencing an episode of stool incontinence and and started to panic. As he was trying to make it to the bathroom, he slipped and hit his head on the tub. Pt had an open wound on his head and lost consciousness and didn't look like he was breathing. [name] called EMS and by the time they arrived, the pt had regained consciousness. She states the EMTs did an EKG when they arrived, but that the pt refused to go to the hospital. Macon Large tells me that the pt usually has loose stool as baseline, but attributes it to his coffee drinking, however this morning was the first time he couldn't make it to the bathroom. She also noticed that while he was unconscious he was making fists and straightening his arms. His food and fluid intake has also been poor and he's very weak.  She asked if Dr.mohamed would think that he'd even benefit from treatment. I told Macon Large he should go to the ER, but that I would also speak to Dr.Mohamed about the situation and would call her back.

## 2023-07-14 NOTE — Progress Notes (Signed)
10:23 I called the [facility] pharmacy on [address] where the pt has his Rxs filled and inquired as to why the Remeron could not be filled. According to the pharmacy tech, the medication was flagged as pt is on Zyvox and there is an interaction between the remeron and zyvox. I messaged Dr.Mohamed via [place] to inform him of the medication interaction. Dr.Mohamed orders Xanax for the pt.   10:37 I called [name] back to let her know the reason for the hold on the Remeron. I let her know that Dr.Mohamed has entered a Rx for Xanax and can be picked up at the pharmacy. I also reiterated the suggestion that the pt go to the Emergency room to get worked up for his head injury and the sudden onset of his stool incontinence and weakness. Macon Large states that the pt is refusing to go to the ER and just needs something for his acute anxiety, because he panics when he's afraid he won't make it to the bathroom. I let [name] and the pt know that in terms of treatment vs hospice, it is the pts decision which he would like to pursue and the oncology team will support whatever decision he makes. [name] verbalizes understanding and states the pt is going to give it some time to think about it. I asked that they call me with any other concerns or decisions.

## 2023-07-15 NOTE — Progress Notes (Signed)
I called the pt this morning to see how he was doing and if the Xanax that Dr.Mohamed had prescribed. I called Maryannes phone. She tells me that the pt is resting. She tells me that the xanax didn't work for him, that it's not enough. She also said that he's not eating or drinking and very weak. I asked him how many episodes of diarrhea he had had since yesterday and she said 5. Chase Lloyd tells me that "they may have known something I didn't" referring to the doctors who discharged him mentioning hospice. I told Chase Lloyd that if that is something the pt wants, we can put a referral in for him. Chase Lloyd had woken up and I asked to speak to him about how he was feeling. Chase Lloyd states, at the moment, that he feels all right, but he's worried about his breathing as he is having to increase his oxygen up to 5L to move around, when he is usually at 3L. The pt thinks it's anxiety and asks if Dr.Mohamed can increase the dose of Xanax. I told the pt I would ask him.  I mentioned to the pt that I'm still recommending that he goes to the ER, especially since he has had poor fluid and food intake, he's having frequent episodes of diarrhea, and that he is having a having a harder time breathing from his baseline. The pt verbalizes understanding. I told the pt I would discuss his situation with Dr.mohamed and get back to him shortly.

## 2023-07-15 NOTE — Progress Notes (Signed)
I called the pt and his wife, Chase Lloyd again after speaking to Dr.mohamed about his symptoms and his request to increase his Xanax. Chase Lloyd placed me on speaker phone so I could speak with her and the pt. I told them that Dr.Mohamed declined to increase the dose of Xanax at this time. Based on the pt's current symptoms, I let them know that Dr.Mohamed's recommendation is to go to the ER, by ambulance as the pt would be seen sooner. I heard the pt say "I ain't going nowhere today." I let the pt know I was able to move his education appt from tomorrow to next Wednesday 9/18 at [facility] states she is already aware of the appt change. Chase Lloyd states she and the pt are confident that the diarrhea will resolve once the pt has completed his course of antibiotics tomorrow. I still encouraged the pt to go to the ER for his increased work of breathing, diarrhea, and poor PO intake. If the pt continues to refuse, I suggested the pt take in an electrolyte drink, like Pedialyte or gatorade, and nutritional drinks like Ensure or Boost. If the pt does not have any of the latter, I suggested the pt or Chase Lloyd make high caloric smoothies. Pt and Chase Lloyd verbalized understanding of the recommendations provided. I asked the pt and Chase Lloyd to call me if they have any other needs or if he decides to go to the ER.

## 2023-07-16 ENCOUNTER — Encounter (HOSPITAL_COMMUNITY): Payer: Self-pay

## 2023-07-16 ENCOUNTER — Other Ambulatory Visit: Payer: Medicare Other

## 2023-07-17 ENCOUNTER — Other Ambulatory Visit: Payer: Self-pay

## 2023-07-17 ENCOUNTER — Ambulatory Visit (HOSPITAL_COMMUNITY)
Admission: RE | Admit: 2023-07-17 | Discharge: 2023-07-17 | Disposition: A | Discharge status: Home / Self Care | Payer: Medicare Other | Source: Ambulatory Visit | Attending: Internal Medicine | Admitting: Internal Medicine

## 2023-07-17 DIAGNOSIS — J90 Pleural effusion, not elsewhere classified: Secondary | ICD-10-CM | POA: Insufficient documentation

## 2023-07-17 DIAGNOSIS — C3490 Malignant neoplasm of unspecified part of unspecified bronchus or lung: Secondary | ICD-10-CM | POA: Diagnosis not present

## 2023-07-17 LAB — GLUCOSE, CAPILLARY: Glucose-Capillary: 99 mg/dL (ref 70–99)

## 2023-07-17 NOTE — Progress Notes (Signed)
The proposed treatment discussed in conference is for discussion purpose only and is not a binding recommendation.  The patients have not been physically examined, or presented with their treatment options.  Therefore, final treatment plans cannot be decided.  

## 2023-07-19 ENCOUNTER — Other Ambulatory Visit: Payer: Self-pay | Admitting: Adult Health

## 2023-07-20 ENCOUNTER — Other Ambulatory Visit: Payer: Self-pay | Admitting: *Deleted

## 2023-07-20 ENCOUNTER — Telehealth: Payer: Self-pay | Admitting: Adult Health

## 2023-07-20 NOTE — Telephone Encounter (Signed)
Pt's spouse called and is begging that someone please create this referral as soon as humanly possible as Pt is not eating, not drinking, and is in horrible pain and can barely move, and needs this referral to [facility].   Spouse was informed that NP is OOO on Mondays, but we would try our best to see if we can get another provider to assist.

## 2023-07-20 NOTE — Telephone Encounter (Signed)
**  URGENT**  Chase Lloyd // Lung Nurse Navigator with Dr. Shirline Frees @ [facility] [phone]  Unfortunately, Pt has been diagnosed with Squamous Cell Carcinoma and needs comfort care. Declining rapidly. Requesting referral "as soon as possible"  Maybe:  [facility]?

## 2023-07-20 NOTE — Progress Notes (Signed)
Pt wife, Macon Large, had left 2 VMs for me this morning requesting a call back. [name] put the pt on speaker to discuss whether or not he should get treatment. The pt was very short of breath just speaking. I once again encouraged him to go to the ED because they be able to make him comfortable. Pt adamantly refused to go. By the end of the conversation, but supported the idea of going on Hospice care. I was explicit when I explained that he would not be eligible to receive treatment for his cancer, just comfort measures. Pt verbalized understanding. I left Dr.Mohamed know in person and was instructed to contact the pt's PCP for a referral to hospice.

## 2023-07-20 NOTE — Progress Notes (Signed)
I called the office of Shirline Frees, the pt's primary provider. I spoke to [place] at the front desk and explained that the pt has stage IV squamous cell lung cancer and is declining rapidly and he would like a referral to hospice. I requested the referral be made urgently. Isa verbalized understanding and stated she'll make sure the message gets sent to the pts PCP as urgent.

## 2023-07-21 ENCOUNTER — Ambulatory Visit (HOSPITAL_COMMUNITY): Payer: Medicare Other

## 2023-07-21 ENCOUNTER — Other Ambulatory Visit: Payer: Self-pay | Admitting: Adult Health

## 2023-07-21 ENCOUNTER — Telehealth: Payer: Self-pay | Admitting: Adult Health

## 2023-07-21 ENCOUNTER — Ambulatory Visit: Payer: Medicare Other | Admitting: Cardiovascular Disease

## 2023-07-21 ENCOUNTER — Other Ambulatory Visit: Payer: Medicare Other

## 2023-07-21 ENCOUNTER — Other Ambulatory Visit (HOSPITAL_COMMUNITY): Payer: Medicare Other

## 2023-07-21 ENCOUNTER — Ambulatory Visit: Payer: Medicare Other | Admitting: Internal Medicine

## 2023-07-21 DIAGNOSIS — R627 Adult failure to thrive: Secondary | ICD-10-CM

## 2023-07-21 MED ORDER — HYDROCODONE-ACETAMINOPHEN 5-325 MG PO TABS
1.0000 | ORAL_TABLET | Freq: Four times a day (QID) | ORAL | 0 refills | Status: DC | PRN
Start: 2023-07-21 — End: 2023-12-16

## 2023-07-21 MED ORDER — ALPRAZOLAM 0.25 MG PO TABS
0.2500 mg | ORAL_TABLET | Freq: Every day | ORAL | 0 refills | Status: DC
Start: 2023-07-21 — End: 2023-12-16

## 2023-07-21 NOTE — Telephone Encounter (Signed)
Patients wife waiting on referral to hospice. His wife reports that he is in a lot of pain and is not eating.   Dr. Gwenyth Bouillon sent in Xanax 0.25 mg dose for him on 07/14/2023. This worked well but they are almost out of medication. Was wondering if she can get some more of this until seen by Hospice   Will send in Xanax and Norco

## 2023-07-21 NOTE — Telephone Encounter (Signed)
Pt spouse notified of update and wanted something for pain and anxiety. Per pt spouse Alprazolam is not working for pt anxiety. Cory notified of this verbally and will send in Oxycodone and something for anxiety.

## 2023-07-21 NOTE — Telephone Encounter (Signed)
Please advise 

## 2023-07-22 ENCOUNTER — Other Ambulatory Visit: Payer: Medicare Other

## 2023-07-22 ENCOUNTER — Ambulatory Visit: Payer: Medicare Other | Admitting: Internal Medicine

## 2023-07-22 ENCOUNTER — Encounter (HOSPITAL_COMMUNITY): Payer: Self-pay

## 2023-07-22 DIAGNOSIS — C3491 Malignant neoplasm of unspecified part of right bronchus or lung: Secondary | ICD-10-CM | POA: Diagnosis not present

## 2023-07-22 DIAGNOSIS — I739 Peripheral vascular disease, unspecified: Secondary | ICD-10-CM | POA: Diagnosis not present

## 2023-07-22 DIAGNOSIS — J42 Unspecified chronic bronchitis: Secondary | ICD-10-CM | POA: Diagnosis not present

## 2023-07-22 DIAGNOSIS — I5030 Unspecified diastolic (congestive) heart failure: Secondary | ICD-10-CM | POA: Diagnosis not present

## 2023-07-22 DIAGNOSIS — J439 Emphysema, unspecified: Secondary | ICD-10-CM | POA: Diagnosis not present

## 2023-07-22 DIAGNOSIS — J9610 Chronic respiratory failure, unspecified whether with hypoxia or hypercapnia: Secondary | ICD-10-CM | POA: Diagnosis not present

## 2023-07-22 DIAGNOSIS — J449 Chronic obstructive pulmonary disease, unspecified: Secondary | ICD-10-CM | POA: Diagnosis not present

## 2023-07-22 DIAGNOSIS — I25119 Atherosclerotic heart disease of native coronary artery with unspecified angina pectoris: Secondary | ICD-10-CM | POA: Diagnosis not present

## 2023-07-22 DIAGNOSIS — I10 Essential (primary) hypertension: Secondary | ICD-10-CM | POA: Diagnosis not present

## 2023-07-22 DIAGNOSIS — I351 Nonrheumatic aortic (valve) insufficiency: Secondary | ICD-10-CM | POA: Diagnosis not present

## 2023-07-22 DIAGNOSIS — I70203 Unspecified atherosclerosis of native arteries of extremities, bilateral legs: Secondary | ICD-10-CM | POA: Diagnosis not present

## 2023-07-22 DIAGNOSIS — K219 Gastro-esophageal reflux disease without esophagitis: Secondary | ICD-10-CM | POA: Diagnosis not present

## 2023-07-22 MED FILL — Fosaprepitant Dimeglumine For IV Infusion 150 MG (Base Eq): INTRAVENOUS | Qty: 5 | Status: AC

## 2023-07-22 MED FILL — Dexamethasone Sodium Phosphate Inj 100 MG/10ML: INTRAMUSCULAR | Qty: 1 | Status: AC

## 2023-07-22 NOTE — Progress Notes (Signed)
I called pts wife, Macon Large, to review the pt's recent PET scan and MRI. I explained to [facility] that there was no dz in the pt's brain, however the cancer had spread to the LNs in his R armpit and in his abdomen. I also explained that there was a very large effusion in his R lung, which may have been what was contributing to the difficultly in his breathing. I let [name] know that I needed to hear from Lazaro that he does not want to pursue treatment and would like to go on hospice. Wasim verbalized yes because it's getting to hard for him to breath. [name] told me that they were using both his 02 concentrator and his portable oxygen. As I was speaking with Macon Large, the hospice service was calling on the other line. I let maryanne and Sollie they could still call me for anything they need. [name] verbalized appreciation for the oncology team.

## 2023-07-22 NOTE — Telephone Encounter (Signed)
Okay for refill?  

## 2023-07-23 ENCOUNTER — Inpatient Hospital Stay: Payer: Medicare Other

## 2023-07-23 ENCOUNTER — Ambulatory Visit: Payer: Medicare Other

## 2023-07-23 DIAGNOSIS — C3491 Malignant neoplasm of unspecified part of right bronchus or lung: Secondary | ICD-10-CM | POA: Diagnosis not present

## 2023-07-23 DIAGNOSIS — I25119 Atherosclerotic heart disease of native coronary artery with unspecified angina pectoris: Secondary | ICD-10-CM | POA: Diagnosis not present

## 2023-07-23 DIAGNOSIS — I5030 Unspecified diastolic (congestive) heart failure: Secondary | ICD-10-CM | POA: Diagnosis not present

## 2023-07-23 DIAGNOSIS — I739 Peripheral vascular disease, unspecified: Secondary | ICD-10-CM | POA: Diagnosis not present

## 2023-07-23 DIAGNOSIS — I351 Nonrheumatic aortic (valve) insufficiency: Secondary | ICD-10-CM | POA: Diagnosis not present

## 2023-07-23 DIAGNOSIS — I10 Essential (primary) hypertension: Secondary | ICD-10-CM | POA: Diagnosis not present

## 2023-07-24 ENCOUNTER — Ambulatory Visit: Payer: Medicare Other

## 2023-07-24 DIAGNOSIS — I5030 Unspecified diastolic (congestive) heart failure: Secondary | ICD-10-CM | POA: Diagnosis not present

## 2023-07-24 DIAGNOSIS — I351 Nonrheumatic aortic (valve) insufficiency: Secondary | ICD-10-CM | POA: Diagnosis not present

## 2023-07-24 DIAGNOSIS — I25119 Atherosclerotic heart disease of native coronary artery with unspecified angina pectoris: Secondary | ICD-10-CM | POA: Diagnosis not present

## 2023-07-24 DIAGNOSIS — C3491 Malignant neoplasm of unspecified part of right bronchus or lung: Secondary | ICD-10-CM | POA: Diagnosis not present

## 2023-07-24 DIAGNOSIS — I739 Peripheral vascular disease, unspecified: Secondary | ICD-10-CM | POA: Diagnosis not present

## 2023-07-24 DIAGNOSIS — I10 Essential (primary) hypertension: Secondary | ICD-10-CM | POA: Diagnosis not present

## 2023-07-24 NOTE — Telephone Encounter (Signed)
Pt spouse wants to know what she needs to do to get her sons paperwork filled out. She stated she doesn't know who to contact to get her sons paperwork filled out. Pt stated she cannot take care of her husband by himself.

## 2023-07-24 NOTE — Telephone Encounter (Signed)
Pt's Spouse called to inform MD that she has taken a leave from work to care for Pt.  Pt is on hospice. Mrs. Mcdade is asking if NP would be willing to sign off on her FMLA paperwork? Pt's grandson is also coming from [place] to help take care of Pt, and Mrs. Kiernan would like to know if NP can sign off on his FMLA paperwork, as well?  Please advise.

## 2023-07-24 NOTE — Telephone Encounter (Signed)
Please advise 

## 2023-07-25 ENCOUNTER — Inpatient Hospital Stay: Payer: Medicare Other

## 2023-07-25 DIAGNOSIS — I5030 Unspecified diastolic (congestive) heart failure: Secondary | ICD-10-CM | POA: Diagnosis not present

## 2023-07-25 DIAGNOSIS — I351 Nonrheumatic aortic (valve) insufficiency: Secondary | ICD-10-CM | POA: Diagnosis not present

## 2023-07-25 DIAGNOSIS — C3491 Malignant neoplasm of unspecified part of right bronchus or lung: Secondary | ICD-10-CM | POA: Diagnosis not present

## 2023-07-25 DIAGNOSIS — I25119 Atherosclerotic heart disease of native coronary artery with unspecified angina pectoris: Secondary | ICD-10-CM | POA: Diagnosis not present

## 2023-07-25 DIAGNOSIS — I10 Essential (primary) hypertension: Secondary | ICD-10-CM | POA: Diagnosis not present

## 2023-07-25 DIAGNOSIS — I739 Peripheral vascular disease, unspecified: Secondary | ICD-10-CM | POA: Diagnosis not present

## 2023-07-28 ENCOUNTER — Telehealth: Payer: Self-pay | Admitting: Adult Health

## 2023-07-28 NOTE — Telephone Encounter (Signed)
Pt spouse notified of message below

## 2023-07-28 NOTE — Telephone Encounter (Signed)
Patient's wife Jadir Ralph, dropped off document FMLA, to be filled out by provider. Patient's wife requested to send it back via call pt's wife to pick up within 5-days. Document is located in providers tray at front office.Please advise at Mobile [phone] (mobile)

## 2023-07-28 NOTE — Telephone Encounter (Signed)
Noted  

## 2023-08-03 NOTE — Progress Notes (Signed)
I called the pts wife, Macon Large, to let her know that the pt had tested positive for the BRCA2 gene, which increases the risk of breast and ovarian cancer for those who carry the gene. I let her know that it was suggested by Dr.Mohamed that the pt's relatives and children discuss getting themselves tested for the gene. If anyone of her family opts to do so, we can place a genetics referral for them here.   [name] appreciated the information and states she will discuss it with her family. I encouraged her to call me with any questions

## 2023-08-04 DEATH — deceased

## 2023-08-10 ENCOUNTER — Other Ambulatory Visit: Payer: Medicare Other

## 2023-08-10 ENCOUNTER — Ambulatory Visit: Payer: Medicare Other

## 2023-08-10 ENCOUNTER — Ambulatory Visit: Payer: Medicare Other | Admitting: Physician Assistant

## 2023-08-11 ENCOUNTER — Ambulatory Visit (HOSPITAL_BASED_OUTPATIENT_CLINIC_OR_DEPARTMENT_OTHER): Payer: Medicare Other | Admitting: Pulmonary Disease

## 2023-08-12 ENCOUNTER — Ambulatory Visit: Payer: Medicare Other

## 2023-08-24 ENCOUNTER — Ambulatory Visit: Payer: Medicare Other | Admitting: Physician Assistant

## 2023-08-31 ENCOUNTER — Other Ambulatory Visit: Payer: Medicare Other

## 2023-08-31 ENCOUNTER — Ambulatory Visit: Payer: Medicare Other | Admitting: Physician Assistant

## 2023-08-31 ENCOUNTER — Ambulatory Visit: Payer: Medicare Other

## 2023-09-02 ENCOUNTER — Ambulatory Visit: Payer: Medicare Other

## 2023-09-15 ENCOUNTER — Encounter (HOSPITAL_COMMUNITY): Payer: Medicare Other

## 2023-09-15 ENCOUNTER — Ambulatory Visit: Payer: Medicare Other | Admitting: Vascular Surgery

## 2023-10-09 ENCOUNTER — Other Ambulatory Visit: Payer: Self-pay

## 2023-11-16 ENCOUNTER — Encounter: Payer: Self-pay | Admitting: Internal Medicine

## 2023-11-23 ENCOUNTER — Telehealth: Payer: Self-pay

## 2023-11-23 NOTE — Telephone Encounter (Signed)
Unsuccessful attempts to reach patient on preferred number listed in notes for scheduled AWV. Left message on voicemail okay to reschedule.

## 4694-05-03 DEATH — deceased
